# Patient Record
Sex: Male | Born: 1940 | Race: White | Hispanic: No | Marital: Married | Smoking: Former smoker
Health system: Southern US, Community
[De-identification: ages and names within clinical notes are randomized; demographics above are authoritative.]

## PROBLEM LIST (undated history)

## (undated) DIAGNOSIS — R55 Syncope and collapse: Secondary | ICD-10-CM

## (undated) DIAGNOSIS — I2510 Atherosclerotic heart disease of native coronary artery without angina pectoris: Secondary | ICD-10-CM

## (undated) DIAGNOSIS — E119 Type 2 diabetes mellitus without complications: Secondary | ICD-10-CM

## (undated) DIAGNOSIS — K219 Gastro-esophageal reflux disease without esophagitis: Secondary | ICD-10-CM

## (undated) DIAGNOSIS — E785 Hyperlipidemia, unspecified: Secondary | ICD-10-CM

## (undated) DIAGNOSIS — T7840XA Allergy, unspecified, initial encounter: Secondary | ICD-10-CM

## (undated) DIAGNOSIS — F419 Anxiety disorder, unspecified: Secondary | ICD-10-CM

## (undated) DIAGNOSIS — I10 Essential (primary) hypertension: Secondary | ICD-10-CM

## (undated) DIAGNOSIS — R011 Cardiac murmur, unspecified: Secondary | ICD-10-CM

## (undated) DIAGNOSIS — I639 Cerebral infarction, unspecified: Secondary | ICD-10-CM

## (undated) DIAGNOSIS — I219 Acute myocardial infarction, unspecified: Secondary | ICD-10-CM

## (undated) DIAGNOSIS — F32A Depression, unspecified: Secondary | ICD-10-CM

## (undated) DIAGNOSIS — M1990 Unspecified osteoarthritis, unspecified site: Secondary | ICD-10-CM

## (undated) DIAGNOSIS — H409 Unspecified glaucoma: Secondary | ICD-10-CM

## (undated) DIAGNOSIS — H269 Unspecified cataract: Secondary | ICD-10-CM

## (undated) HISTORY — PX: NECK SURGERY: SHX720

## (undated) HISTORY — DX: Unspecified glaucoma: H40.9

## (undated) HISTORY — DX: Syncope and collapse: R55

## (undated) HISTORY — DX: Essential (primary) hypertension: I10

## (undated) HISTORY — PX: MOHS SURGERY: SUR867

## (undated) HISTORY — PX: COSMETIC SURGERY: SHX468

## (undated) HISTORY — DX: Cardiac murmur, unspecified: R01.1

## (undated) HISTORY — DX: Gastro-esophageal reflux disease without esophagitis: K21.9

## (undated) HISTORY — PX: JOINT REPLACEMENT: SHX530

## (undated) HISTORY — PX: BACK SURGERY: SHX140

## (undated) HISTORY — PX: VASECTOMY: SHX75

## (undated) HISTORY — DX: Cerebral infarction, unspecified: I63.9

## (undated) HISTORY — DX: Hyperlipidemia, unspecified: E78.5

## (undated) HISTORY — DX: Unspecified cataract: H26.9

## (undated) HISTORY — DX: Unspecified osteoarthritis, unspecified site: M19.90

## (undated) HISTORY — DX: Atherosclerotic heart disease of native coronary artery without angina pectoris: I25.10

## (undated) HISTORY — PX: EYE SURGERY: SHX253

## (undated) HISTORY — DX: Allergy, unspecified, initial encounter: T78.40XA

## (undated) HISTORY — DX: Acute myocardial infarction, unspecified: I21.9

## (undated) HISTORY — PX: CHOLECYSTECTOMY: SHX55

---

## 1998-04-29 HISTORY — PX: CORONARY ARTERY BYPASS GRAFT: SHX141

## 2004-09-20 DIAGNOSIS — M47812 Spondylosis without myelopathy or radiculopathy, cervical region: Secondary | ICD-10-CM | POA: Insufficient documentation

## 2004-11-22 DIAGNOSIS — M75120 Complete rotator cuff tear or rupture of unspecified shoulder, not specified as traumatic: Secondary | ICD-10-CM | POA: Insufficient documentation

## 2005-12-25 DIAGNOSIS — M778 Other enthesopathies, not elsewhere classified: Secondary | ICD-10-CM | POA: Insufficient documentation

## 2005-12-25 DIAGNOSIS — M7700 Medial epicondylitis, unspecified elbow: Secondary | ICD-10-CM | POA: Insufficient documentation

## 2013-04-14 DIAGNOSIS — M19019 Primary osteoarthritis, unspecified shoulder: Secondary | ICD-10-CM | POA: Insufficient documentation

## 2015-12-11 DIAGNOSIS — N401 Enlarged prostate with lower urinary tract symptoms: Secondary | ICD-10-CM | POA: Insufficient documentation

## 2015-12-11 DIAGNOSIS — N138 Other obstructive and reflux uropathy: Secondary | ICD-10-CM | POA: Insufficient documentation

## 2020-01-26 ENCOUNTER — Ambulatory Visit: Payer: Medicare Other | Admitting: Cardiology

## 2020-03-11 NOTE — Progress Notes (Signed)
Cardiology Office Note   Date:  03/13/2020   ID:  Cory [name], DOB 10-18-1940, MRN [id number]  PCP:  Sueanne Margarita, DO  Cardiologist:   Alistair Senft [place], MD   Chief Complaint  Patient presents with  . Coronary Artery Disease      History of Present Illness: Cory [name] is a 79 y.o. male who is seen at the request of Dr Francesco Sor for evaluation of CAD. He has a history of CAD s/p CABG and prior CVA. History of DM type 2, HTN, and HLD. Previously followed by Dr Angelena Sole in [place], [place].   The patient had CABG in 2000 in [place], [place] at [facility] in Nov 2016 showed a fixed inferior defect c/w scar and a small reversible basal to mid anterior wall defect. EF 52%. He presented in Dec 2019 with progressive angina. Cardiac cath demonstrated a patent LIMA to the LAD, sequential SVG to the diagonal and distal LAD, SVG to ramus, SVG to OM and SVG to PDA. There was a stenosis in the distal SVG to OM that was treated percutaneously. Records don't say what type of PCI done. Post procedure he developed difficulty with aphasia and confusion. Stroke work up was negative and symptoms cleared. It was felt this was related to effects of sedation.   He has a history of thoracic aortic aneurysm measuring 4.6 cm by Echo but 4.3 cm by CT- done October 2020. He has a history of CVA in 2015 and subsequent 2 TIAs since then. 2 week event monitor done within the last year showed rare PACs and PVCs. No evidence of Afib. Echo done in the summer of 2020 showed normal EF with mild TR and MR. He has a history of HLD, HTN, GERD.   On follow up today he reports he is feeling well. No chest pain since last PCI. Denies any dyspnea or palpitations. Moved here to be closer to family.     Past Medical History:  Diagnosis Date  . CAD (coronary artery disease)   . GERD (gastroesophageal reflux disease)   . Heart murmur   . Hyperlipidemia   . Hypertension   . Stroke ([place])    . Vasovagal syncope     Past Surgical History:  Procedure Laterality Date  . BACK SURGERY    . CHOLECYSTECTOMY    . CORONARY ARTERY BYPASS GRAFT  2000  . NECK SURGERY       Current Outpatient Medications  Medication Sig Dispense Refill  . acetaminophen (TYLENOL) 500 MG tablet Take by mouth.    Marland Kitchen amLODipine (NORVASC) 5 MG tablet Take by mouth.    Marland Kitchen atorvastatin (LIPITOR) 40 MG tablet atorvastatin 40 mg tablet    . cloNIDine (CATAPRES) 0.1 MG tablet     . clopidogrel (PLAVIX) 75 MG tablet clopidogrel 75 mg tablet    . cycloSPORINE (RESTASIS) 0.05 % ophthalmic emulsion Restasis 0.05 % eye drops in a dropperette    . cycloSPORINE (RESTASIS) 0.05 % ophthalmic emulsion 1 drop 2 (two) times a day.    . diclofenac Sodium (VOLTAREN) 1 % GEL Apply topically.    . famotidine (PEPCID) 20 MG tablet famotidine    . fluticasone (FLONASE) 50 MCG/ACT nasal spray 1 spray by Each Nare route nightly.    Marland Kitchen lisinopril (ZESTRIL) 10 MG tablet lisinopril 10 mg tablet    . melatonin 1 MG TABS tablet Take by mouth.    . metFORMIN (GLUCOPHAGE) 500 MG tablet metformin 500  mg tablet    . montelukast (SINGULAIR) 10 MG tablet montelukast 10 mg tablet    . Multiple Vitamins-Minerals (PX COMPLETE SENIOR MULTIVITS) TABS Take by mouth.    . nitroGLYCERIN (NITROSTAT) 0.4 MG SL tablet Place under the tongue.    . pantoprazole (PROTONIX) 40 MG tablet pantoprazole 40 mg tablet,delayed release    . aspirin EC 81 MG tablet Take 1 tablet (81 mg total) by mouth daily. Swallow whole. 90 tablet 3   No current facility-administered medications for this visit.    Allergies:   Patient has no allergy information on record.    Social History:  The patient  reports that he has quit smoking. His smoking use included pipe. He has never used smokeless tobacco. He reports current drug use. He reports that he does not drink alcohol.   Family History:  The patient's family history includes Heart attack in his father; Heart  disease in his brother and mother.    ROS:  Please see the history of present illness.   Otherwise, review of systems are positive for none.   All other systems are reviewed and negative.    PHYSICAL EXAM: VS:  BP 140/71   Pulse 67   Temp (!) 97.3 F (36.3 C)   Ht 5\' 5"  (1.651 m)   Wt 166 lb 12.8 oz (75.7 kg)   SpO2 96%   BMI 27.76 kg/m  , BMI Body mass index is 27.76 kg/m. GEN: Well nourished, well developed, in no acute distress  HEENT: normal  Neck: no JVD, carotid bruits, or masses Cardiac: RRR; gr 2/6 apical systolic murmur, No rubs, or gallops,no edema  Respiratory:  clear to auscultation bilaterally, normal work of breathing GI: soft, nontender, nondistended, + BS MS: no deformity or atrophy  Skin: warm and dry, no rash Neuro:  Strength and sensation are intact Psych: euthymic mood, full affect   EKG:  EKG is ordered today. The ekg ordered today demonstrates NSR rate 67. First degree AV block. Probable old inferior infarct. I have personally reviewed and interpreted this study.    Recent Labs: No results found for requested labs within last 8760 hours.    Lipid Panel No results found for: CHOL, TRIG, HDL, CHOLHDL, VLDL, LDLCALC, LDLDIRECT   Labs dated 10/19/19: normal BMET, HGB, TSH. A1c 6.6% Dated 11/24/19: A1c 5.8%   Wt Readings from Last 3 Encounters:  03/13/20 166 lb 12.8 oz (75.7 kg)      Other studies Reviewed: Additional studies/ records that were reviewed today include: Records from Dr Verlon Setting was summarized above.   ASSESSMENT AND PLAN:  1.  CAD s/p remote CABG x 6 in 2000. S/p PCI of SVG to OM in Dec 2019. I would recommend continuing DAPT given significant history of CVA. Will reduce ASA to 81 mg daily. He is currently without significant angina. Class 1. Continue amlodipine  2. Thoracic aortic aneurysm. 4.3 cm by CT. Consider repeat scan within the next year  3. History of CVA with recurrent TIAs.  4. HLD. On lipitor 40 mg daily. Will  update labs  5. HTN. Stable.  6. DM type 2 on metformin. Check A1c.     Current medicines are reviewed at length with the patient today.  The patient does not have concerns regarding medicines.  The following changes have been made:  Reduce ASA to 81 mg daily.   Labs/ tests ordered today include:   Orders Placed This Encounter  Procedures  . CBC w/Diff/Platelet  . Comprehensive  Metabolic Panel (CMET)  . Lipid panel  . HgB A1c  . TSH  . EKG 12-Lead     Disposition:   FU with me in 6 months  Signed, Davian Hanshaw Martinique, MD  03/13/2020 12:11 PM    [facility] [address], [place], [place], [address] Phone [phone], Fax [phone]

## 2020-03-13 ENCOUNTER — Encounter: Payer: Self-pay | Admitting: Cardiology

## 2020-03-13 ENCOUNTER — Ambulatory Visit (INDEPENDENT_AMBULATORY_CARE_PROVIDER_SITE_OTHER): Payer: Medicare Other | Admitting: Cardiology

## 2020-03-13 VITALS — BP 140/71 | HR 67 | Temp 97.3°F | Ht 65.0 in | Wt 166.8 lb

## 2020-03-13 DIAGNOSIS — I712 Thoracic aortic aneurysm, without rupture, unspecified: Secondary | ICD-10-CM

## 2020-03-13 DIAGNOSIS — I25708 Atherosclerosis of coronary artery bypass graft(s), unspecified, with other forms of angina pectoris: Secondary | ICD-10-CM | POA: Diagnosis not present

## 2020-03-13 DIAGNOSIS — E785 Hyperlipidemia, unspecified: Secondary | ICD-10-CM

## 2020-03-13 DIAGNOSIS — Z8673 Personal history of transient ischemic attack (TIA), and cerebral infarction without residual deficits: Secondary | ICD-10-CM

## 2020-03-13 DIAGNOSIS — I10 Essential (primary) hypertension: Secondary | ICD-10-CM

## 2020-03-13 MED ORDER — ASPIRIN EC 81 MG PO TBEC: 81.0000 mg | DELAYED_RELEASE_TABLET | Freq: Every day | ORAL | 3 refills | Status: AC

## 2020-03-13 NOTE — Patient Instructions (Signed)
We will check lab work today  Reduce ASA to 81 mg daily  Follow up in 6 months.

## 2020-03-14 LAB — CBC WITH DIFFERENTIAL/PLATELET
Basophils Absolute: 0 10*3/uL (ref 0.0–0.2)
Basos: 1 %
EOS (ABSOLUTE): 0.1 10*3/uL (ref 0.0–0.4)
Eos: 2 %
Hematocrit: 37 % — ABNORMAL LOW (ref 37.5–51.0)
Hemoglobin: 12.1 g/dL — ABNORMAL LOW (ref 13.0–17.7)
Immature Grans (Abs): 0 10*3/uL (ref 0.0–0.1)
Immature Granulocytes: 0 %
Lymphocytes Absolute: 1 10*3/uL (ref 0.7–3.1)
Lymphs: 21 %
MCH: 30.3 pg (ref 26.6–33.0)
MCHC: 32.7 g/dL (ref 31.5–35.7)
MCV: 93 fL (ref 79–97)
Monocytes Absolute: 0.6 10*3/uL (ref 0.1–0.9)
Monocytes: 13 %
Neutrophils Absolute: 2.9 10*3/uL (ref 1.4–7.0)
Neutrophils: 63 %
Platelets: 216 10*3/uL (ref 150–450)
RBC: 3.99 x10E6/uL — ABNORMAL LOW (ref 4.14–5.80)
RDW: 12.3 % (ref 11.6–15.4)
WBC: 4.6 10*3/uL (ref 3.4–10.8)

## 2020-03-14 LAB — COMPREHENSIVE METABOLIC PANEL
ALT: 20 IU/L (ref 0–44)
AST: 25 IU/L (ref 0–40)
Albumin/Globulin Ratio: 2 (ref 1.2–2.2)
Albumin: 4.3 g/dL (ref 3.7–4.7)
Alkaline Phosphatase: 55 IU/L (ref 44–121)
BUN/Creatinine Ratio: 13 (ref 10–24)
BUN: 10 mg/dL (ref 8–27)
Bilirubin Total: 0.2 mg/dL (ref 0.0–1.2)
CO2: 21 mmol/L (ref 20–29)
Calcium: 9.4 mg/dL (ref 8.6–10.2)
Chloride: 98 mmol/L (ref 96–106)
Creatinine, Ser: 0.8 mg/dL (ref 0.76–1.27)
GFR calc Af Amer: 98 mL/min/{1.73_m2} (ref >59)
GFR calc non Af Amer: 84 mL/min/{1.73_m2} (ref >59)
Globulin, Total: 2.1 g/dL (ref 1.5–4.5)
Glucose: 108 mg/dL — ABNORMAL HIGH (ref 65–99)
Potassium: 5 mmol/L (ref 3.5–5.2)
Sodium: 134 mmol/L (ref 134–144)
Total Protein: 6.4 g/dL (ref 6.0–8.5)

## 2020-03-14 LAB — HEMOGLOBIN A1C
Est. average glucose Bld gHb Est-mCnc: 131 mg/dL
Hgb A1c MFr Bld: 6.2 % — ABNORMAL HIGH (ref 4.8–5.6)

## 2020-03-14 LAB — LIPID PANEL
Chol/HDL Ratio: 2.9 ratio (ref 0.0–5.0)
Cholesterol, Total: 137 mg/dL (ref 100–199)
HDL: 47 mg/dL (ref >39)
LDL Chol Calc (NIH): 65 mg/dL (ref 0–99)
Triglycerides: 146 mg/dL (ref 0–149)
VLDL Cholesterol Cal: 25 mg/dL (ref 5–40)

## 2020-03-14 LAB — TSH: TSH: 1.86 u[IU]/mL (ref 0.450–4.500)

## 2020-04-13 ENCOUNTER — Ambulatory Visit (INDEPENDENT_AMBULATORY_CARE_PROVIDER_SITE_OTHER): Payer: Medicare Other | Admitting: Family Medicine

## 2020-04-13 ENCOUNTER — Encounter: Payer: Self-pay | Admitting: Family Medicine

## 2020-04-13 ENCOUNTER — Other Ambulatory Visit: Payer: Self-pay

## 2020-04-13 VITALS — BP 120/78 | HR 66 | Temp 97.6°F | Ht 65.0 in | Wt 167.0 lb

## 2020-04-13 DIAGNOSIS — I10 Essential (primary) hypertension: Secondary | ICD-10-CM | POA: Diagnosis not present

## 2020-04-13 DIAGNOSIS — R7303 Prediabetes: Secondary | ICD-10-CM

## 2020-04-13 DIAGNOSIS — I6930 Unspecified sequelae of cerebral infarction: Secondary | ICD-10-CM | POA: Insufficient documentation

## 2020-04-13 DIAGNOSIS — K2270 Barrett's esophagus without dysplasia: Secondary | ICD-10-CM | POA: Insufficient documentation

## 2020-04-13 DIAGNOSIS — K22719 Barrett's esophagus with dysplasia, unspecified: Secondary | ICD-10-CM

## 2020-04-13 DIAGNOSIS — K449 Diaphragmatic hernia without obstruction or gangrene: Secondary | ICD-10-CM | POA: Diagnosis not present

## 2020-04-13 DIAGNOSIS — J302 Other seasonal allergic rhinitis: Secondary | ICD-10-CM | POA: Diagnosis not present

## 2020-04-13 DIAGNOSIS — M1990 Unspecified osteoarthritis, unspecified site: Secondary | ICD-10-CM | POA: Insufficient documentation

## 2020-04-13 DIAGNOSIS — I2510 Atherosclerotic heart disease of native coronary artery without angina pectoris: Secondary | ICD-10-CM | POA: Insufficient documentation

## 2020-04-13 DIAGNOSIS — R351 Nocturia: Secondary | ICD-10-CM

## 2020-04-13 DIAGNOSIS — R600 Localized edema: Secondary | ICD-10-CM

## 2020-04-13 DIAGNOSIS — I69351 Hemiplegia and hemiparesis following cerebral infarction affecting right dominant side: Secondary | ICD-10-CM | POA: Insufficient documentation

## 2020-04-13 DIAGNOSIS — E1169 Type 2 diabetes mellitus with other specified complication: Secondary | ICD-10-CM | POA: Insufficient documentation

## 2020-04-13 MED ORDER — CLOPIDOGREL BISULFATE 75 MG PO TABS: 75.0000 mg | ORAL_TABLET | Freq: Every day | ORAL | 3 refills | Status: DC

## 2020-04-13 MED ORDER — METFORMIN HCL 500 MG PO TABS: 500.0000 mg | ORAL_TABLET | Freq: Every day | ORAL | 3 refills | Status: DC

## 2020-04-13 MED ORDER — LISINOPRIL 10 MG PO TABS: 10.0000 mg | ORAL_TABLET | Freq: Every day | ORAL | 3 refills | Status: DC

## 2020-04-13 MED ORDER — AMLODIPINE BESYLATE 5 MG PO TABS: 5.0000 mg | ORAL_TABLET | Freq: Every day | ORAL | 3 refills | Status: DC

## 2020-04-13 MED ORDER — CLONIDINE HCL 0.1 MG PO TABS: 0.1000 mg | ORAL_TABLET | Freq: Two times a day (BID) | ORAL | 3 refills | Status: DC

## 2020-04-13 MED ORDER — PANTOPRAZOLE SODIUM 40 MG PO TBEC: 40.0000 mg | DELAYED_RELEASE_TABLET | Freq: Two times a day (BID) | ORAL | 3 refills | Status: DC

## 2020-04-13 MED ORDER — ATORVASTATIN CALCIUM 40 MG PO TABS: 40.0000 mg | ORAL_TABLET | Freq: Every day | ORAL | 3 refills | Status: DC

## 2020-04-13 MED ORDER — MONTELUKAST SODIUM 10 MG PO TABS: 10.0000 mg | ORAL_TABLET | Freq: Every day | ORAL | 3 refills | Status: DC

## 2020-04-13 NOTE — Assessment & Plan Note (Signed)
Suspect this is contributing to edema. Cont with PT to work on mobility.

## 2020-04-13 NOTE — Assessment & Plan Note (Signed)
Discussed trial of switching pantoprazole from 40 mg BID to once daily. Cont famotidine 20 mg daily. Cont to avoid food triggers

## 2020-04-13 NOTE — Assessment & Plan Note (Signed)
Cont singulair 10 mg

## 2020-04-13 NOTE — Assessment & Plan Note (Signed)
Hgba1c 6.2. Cont metformin 500 mg daily

## 2020-04-13 NOTE — Assessment & Plan Note (Signed)
BP at goal. Cont lisinopril 10 mg, clonidine 0.1 mg, amlodipine 5 mg. Appreciate cardiology support.

## 2020-04-13 NOTE — Progress Notes (Signed)
Subjective:     Cory [name] is a 79 y.o. 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 Types: Pipe  Smokeless Tobacco Never Used        Objective:    BP Readings from Last 3 Encounters:  04/13/20 120/78  03/13/20 140/71   Wt Readings from Last 3 Encounters:  04/13/20 167 lb (75.8 kg)  03/13/20 166 lb 12.8 oz (75.7 kg)    BP 120/78   Pulse 66   Temp 97.6 F (36.4 C) (Temporal)   Ht 5\' 5"  (1.651 m)   Wt 167 lb (75.8 kg)   SpO2 97%   BMI 27.79 kg/m    Physical Exam Constitutional:      Appearance: Normal appearance. He is not ill-appearing or diaphoretic.  HENT:     Right Ear: External ear normal.     Left Ear: External ear normal.  Eyes:     General: No scleral icterus.    Extraocular Movements: Extraocular movements intact.     Conjunctiva/sclera: Conjunctivae normal.  Cardiovascular:     Rate and Rhythm: Normal rate and regular rhythm.     Heart sounds: Heart sounds are distant.  Pulmonary:     Effort: Pulmonary effort is normal.     Breath sounds: Normal breath sounds.  Musculoskeletal:     Cervical back: Neck supple.     Right lower leg: Edema (1+ in compression socks) present.     Left lower leg: Edema (trace, in  compression socks) present.  Skin:    General: Skin is warm and dry.  Neurological:     Mental Status: He is alert. Mental status is at baseline.  Psychiatric:        Mood and Affect: Mood normal.           Assessment & Plan:   Problem List Items Addressed This Visit      Cardiovascular and Mediastinum   Hypertension    BP at goal. Cont lisinopril 10 mg, clonidine 0.1 mg, amlodipine 5 mg. Appreciate cardiology support.       Relevant Medications   amLODipine (NORVASC) 5 MG tablet   atorvastatin (LIPITOR) 40 MG tablet   cloNIDine (CATAPRES) 0.1 MG tablet   lisinopril (ZESTRIL) 10 MG tablet     Digestive   Barrett esophagus    Discussed trial of switching pantoprazole from 40 mg BID to once daily. Cont famotidine 20 mg daily. Cont to avoid food triggers        Other   Prediabetes    Hgba1c 6.2. Cont metformin 500 mg daily      Relevant Medications   metFORMIN (GLUCOPHAGE) 500 MG  tablet   History of CVA with residual deficit    Suspect this is contributing to edema. Cont with PT to work on mobility.       Relevant Medications   atorvastatin (LIPITOR) 40 MG tablet   clopidogrel (PLAVIX) 75 MG tablet   Hiatal hernia - Primary   Relevant Medications   pantoprazole (PROTONIX) 40 MG tablet   Seasonal allergies    Cont singulair 10 mg      Relevant Medications   montelukast (SINGULAIR) 10 MG tablet   Nocturia    Hx of procedure with urology. Not interested in medication at this time. Did respond to Myrbetriq in the past but this was costly. Advised urology referral if becoming bothersome.       Lower extremity edema    Multifactorial - prior stroke, hx of right hip replacement, hx of quadricept injury. Encouraged daily compression socks, elevation, and continued working with PT on strengthening. If worsening acutely would consider [place] and could consider diuretic if current treatment does not work.            Return in about 4 months (around  08/12/2020).  [name], MD  This visit occurred during the SARS-CoV-2 public health emergency.  Safety protocols were in place, including screening questions prior to the visit, additional usage of staff PPE, and extensive cleaning of exam room while observing appropriate contact time as indicated for disinfecting solutions.

## 2020-04-13 NOTE — Patient Instructions (Addendum)
#  Heartburn  - try decreasing Pantoprazole to 40 mg once daily - Cont Famotidine - If symptoms return - try taking Famotidine twice daily for 2 weeks to see if that will control symptoms - if no improvement, can return to Pantoprazole twice daily and famotidine daily  #Edema - Continue wearing compression socks - continue working with physical therapy

## 2020-04-13 NOTE — Assessment & Plan Note (Signed)
Hx of procedure with urology. Not interested in medication at this time. Did respond to Myrbetriq in the past but this was costly. Advised urology referral if becoming bothersome.

## 2020-04-13 NOTE — Assessment & Plan Note (Signed)
Multifactorial - prior stroke, hx of right hip replacement, hx of quadricept injury. Encouraged daily compression socks, elevation, and continued working with PT on strengthening. If worsening acutely would consider [place] and could consider diuretic if current treatment does not work.

## 2020-05-06 ENCOUNTER — Observation Stay: Payer: Medicare Other

## 2020-05-06 ENCOUNTER — Observation Stay
Admission: EM | Admit: 2020-05-06 | Discharge: 2020-05-07 | Disposition: A | Discharge status: Home / Self Care | Payer: Medicare Other | Attending: Internal Medicine | Admitting: Internal Medicine

## 2020-05-06 ENCOUNTER — Emergency Department: Payer: Medicare Other

## 2020-05-06 DIAGNOSIS — R4701 Aphasia: Secondary | ICD-10-CM | POA: Diagnosis present

## 2020-05-06 DIAGNOSIS — I2510 Atherosclerotic heart disease of native coronary artery without angina pectoris: Secondary | ICD-10-CM | POA: Insufficient documentation

## 2020-05-06 DIAGNOSIS — R2689 Other abnormalities of gait and mobility: Secondary | ICD-10-CM | POA: Diagnosis not present

## 2020-05-06 DIAGNOSIS — I25119 Atherosclerotic heart disease of native coronary artery with unspecified angina pectoris: Secondary | ICD-10-CM | POA: Diagnosis not present

## 2020-05-06 DIAGNOSIS — K219 Gastro-esophageal reflux disease without esophagitis: Secondary | ICD-10-CM | POA: Insufficient documentation

## 2020-05-06 DIAGNOSIS — Z7902 Long term (current) use of antithrombotics/antiplatelets: Secondary | ICD-10-CM | POA: Diagnosis not present

## 2020-05-06 DIAGNOSIS — Z79899 Other long term (current) drug therapy: Secondary | ICD-10-CM | POA: Diagnosis not present

## 2020-05-06 DIAGNOSIS — I671 Cerebral aneurysm, nonruptured: Secondary | ICD-10-CM | POA: Diagnosis not present

## 2020-05-06 DIAGNOSIS — I10 Essential (primary) hypertension: Secondary | ICD-10-CM | POA: Diagnosis not present

## 2020-05-06 DIAGNOSIS — I6782 Cerebral ischemia: Secondary | ICD-10-CM | POA: Insufficient documentation

## 2020-05-06 DIAGNOSIS — E871 Hypo-osmolality and hyponatremia: Secondary | ICD-10-CM | POA: Insufficient documentation

## 2020-05-06 DIAGNOSIS — I6523 Occlusion and stenosis of bilateral carotid arteries: Secondary | ICD-10-CM | POA: Insufficient documentation

## 2020-05-06 DIAGNOSIS — Z20822 Contact with and (suspected) exposure to covid-19: Secondary | ICD-10-CM | POA: Diagnosis not present

## 2020-05-06 DIAGNOSIS — Z8249 Family history of ischemic heart disease and other diseases of the circulatory system: Secondary | ICD-10-CM | POA: Diagnosis not present

## 2020-05-06 DIAGNOSIS — Z7982 Long term (current) use of aspirin: Secondary | ICD-10-CM | POA: Diagnosis not present

## 2020-05-06 DIAGNOSIS — M6281 Muscle weakness (generalized): Secondary | ICD-10-CM | POA: Diagnosis not present

## 2020-05-06 DIAGNOSIS — E785 Hyperlipidemia, unspecified: Secondary | ICD-10-CM | POA: Diagnosis not present

## 2020-05-06 DIAGNOSIS — G459 Transient cerebral ischemic attack, unspecified: Secondary | ICD-10-CM | POA: Diagnosis not present

## 2020-05-06 DIAGNOSIS — Z951 Presence of aortocoronary bypass graft: Secondary | ICD-10-CM | POA: Insufficient documentation

## 2020-05-06 DIAGNOSIS — F419 Anxiety disorder, unspecified: Secondary | ICD-10-CM

## 2020-05-06 DIAGNOSIS — Z885 Allergy status to narcotic agent status: Secondary | ICD-10-CM | POA: Insufficient documentation

## 2020-05-06 DIAGNOSIS — Z8673 Personal history of transient ischemic attack (TIA), and cerebral infarction without residual deficits: Secondary | ICD-10-CM | POA: Diagnosis not present

## 2020-05-06 DIAGNOSIS — Z87891 Personal history of nicotine dependence: Secondary | ICD-10-CM | POA: Insufficient documentation

## 2020-05-06 DIAGNOSIS — Z880 Allergy status to penicillin: Secondary | ICD-10-CM | POA: Insufficient documentation

## 2020-05-06 LAB — URINE DRUG SCREEN, QUALITATIVE (ARMC ONLY)
Amphetamines, Ur Screen: NOT DETECTED
Barbiturates, Ur Screen: NOT DETECTED
Benzodiazepine, Ur Scrn: NOT DETECTED
Cannabinoid 50 Ng, Ur ~~LOC~~: NOT DETECTED
Cocaine Metabolite,Ur ~~LOC~~: NOT DETECTED
MDMA (Ecstasy)Ur Screen: NOT DETECTED
Methadone Scn, Ur: NOT DETECTED
Opiate, Ur Screen: NOT DETECTED
Phencyclidine (PCP) Ur S: NOT DETECTED
Tricyclic, Ur Screen: NOT DETECTED

## 2020-05-06 LAB — CHLORIDE, URINE, RANDOM: Chloride Urine: 71 mmol/L

## 2020-05-06 LAB — URINALYSIS, ROUTINE W REFLEX MICROSCOPIC
Bilirubin Urine: NEGATIVE
Glucose, UA: NEGATIVE mg/dL
Hgb urine dipstick: NEGATIVE
Ketones, ur: NEGATIVE mg/dL
Leukocytes,Ua: NEGATIVE
Nitrite: NEGATIVE
Protein, ur: NEGATIVE mg/dL
Specific Gravity, Urine: 1.006 (ref 1.005–1.030)
pH: 6 (ref 5.0–8.0)

## 2020-05-06 LAB — COMPREHENSIVE METABOLIC PANEL
ALT: 22 U/L (ref 0–44)
AST: 26 U/L (ref 15–41)
Albumin: 4.1 g/dL (ref 3.5–5.0)
Alkaline Phosphatase: 49 U/L (ref 38–126)
Anion gap: 12 (ref 5–15)
BUN: 11 mg/dL (ref 8–23)
CO2: 22 mmol/L (ref 22–32)
Calcium: 9.2 mg/dL (ref 8.9–10.3)
Chloride: 96 mmol/L — ABNORMAL LOW (ref 98–111)
Creatinine, Ser: 0.92 mg/dL (ref 0.61–1.24)
GFR, Estimated: >60 mL/min (ref >60)
Glucose, Bld: 139 mg/dL — ABNORMAL HIGH (ref 70–99)
Potassium: 4.1 mmol/L (ref 3.5–5.1)
Sodium: 130 mmol/L — ABNORMAL LOW (ref 135–145)
Total Bilirubin: 0.7 mg/dL (ref 0.3–1.2)
Total Protein: 7.2 g/dL (ref 6.5–8.1)

## 2020-05-06 LAB — CBC WITH DIFFERENTIAL/PLATELET
Abs Immature Granulocytes: 0.01 10*3/uL (ref 0.00–0.07)
Basophils Absolute: 0 10*3/uL (ref 0.0–0.1)
Basophils Relative: 1 %
Eosinophils Absolute: 0.1 10*3/uL (ref 0.0–0.5)
Eosinophils Relative: 2 %
HCT: 35.4 % — ABNORMAL LOW (ref 39.0–52.0)
Hemoglobin: 11.8 g/dL — ABNORMAL LOW (ref 13.0–17.0)
Immature Granulocytes: 0 %
Lymphocytes Relative: 19 %
Lymphs Abs: 1.2 10*3/uL (ref 0.7–4.0)
MCH: 30.6 pg (ref 26.0–34.0)
MCHC: 33.3 g/dL (ref 30.0–36.0)
MCV: 91.9 fL (ref 80.0–100.0)
Monocytes Absolute: 0.7 10*3/uL (ref 0.1–1.0)
Monocytes Relative: 10 %
Neutro Abs: 4.6 10*3/uL (ref 1.7–7.7)
Neutrophils Relative %: 68 %
Platelets: 226 10*3/uL (ref 150–400)
RBC: 3.85 MIL/uL — ABNORMAL LOW (ref 4.22–5.81)
RDW: 12.4 % (ref 11.5–15.5)
WBC: 6.6 10*3/uL (ref 4.0–10.5)
nRBC: 0 % (ref 0.0–0.2)

## 2020-05-06 LAB — APTT: aPTT: 32 seconds (ref 24–36)

## 2020-05-06 LAB — PROTIME-INR
INR: 1.1 (ref 0.8–1.2)
Prothrombin Time: 13.6 seconds (ref 11.4–15.2)

## 2020-05-06 LAB — CBG MONITORING, ED: Glucose-Capillary: 128 mg/dL — ABNORMAL HIGH (ref 70–99)

## 2020-05-06 LAB — OSMOLALITY, URINE: Osmolality, Ur: 251 mOsm/kg — ABNORMAL LOW (ref 300–900)

## 2020-05-06 LAB — OSMOLALITY: Osmolality: 280 mOsm/kg (ref 275–295)

## 2020-05-06 LAB — ETHANOL: Alcohol, Ethyl (B): <10 mg/dL (ref <10)

## 2020-05-06 LAB — SODIUM, URINE, RANDOM: Sodium, Ur: 72 mmol/L

## 2020-05-06 MED ORDER — ENOXAPARIN SODIUM 40 MG/0.4ML ~~LOC~~ SOLN
40.0000 mg | SUBCUTANEOUS | Status: DC
  Administered 2020-05-06: 40 mg via SUBCUTANEOUS
  Filled 2020-05-06: qty 0.4

## 2020-05-06 MED ORDER — HYDRALAZINE HCL 20 MG/ML IJ SOLN: 10.0000 mg | Freq: Four times a day (QID) | INTRAMUSCULAR | Status: DC | PRN

## 2020-05-06 MED ORDER — FAMOTIDINE 20 MG PO TABS
20.0000 mg | ORAL_TABLET | Freq: Every day | ORAL | Status: DC
  Administered 2020-05-06: 20 mg via ORAL
  Filled 2020-05-06: qty 1

## 2020-05-06 MED ORDER — CLOPIDOGREL BISULFATE 75 MG PO TABS
75.0000 mg | ORAL_TABLET | Freq: Every day | ORAL | Status: DC
  Administered 2020-05-07: 75 mg via ORAL
  Filled 2020-05-06 (×2): qty 1

## 2020-05-06 MED ORDER — ACETAMINOPHEN 325 MG PO TABS
650.0000 mg | ORAL_TABLET | Freq: Four times a day (QID) | ORAL | Status: DC | PRN
  Administered 2020-05-06: 650 mg via ORAL
  Filled 2020-05-06: qty 2

## 2020-05-06 MED ORDER — STROKE: EARLY STAGES OF RECOVERY BOOK: Freq: Once | Status: DC

## 2020-05-06 MED ORDER — LISINOPRIL 10 MG PO TABS
10.0000 mg | ORAL_TABLET | Freq: Every day | ORAL | Status: DC
Start: 2020-05-06 — End: 2020-05-07
  Administered 2020-05-07: 10 mg via ORAL
  Filled 2020-05-06 (×2): qty 1

## 2020-05-06 MED ORDER — ASPIRIN EC 81 MG PO TBEC
81.0000 mg | DELAYED_RELEASE_TABLET | Freq: Every day | ORAL | Status: DC
Start: 2020-05-06 — End: 2020-05-07
  Administered 2020-05-07: 81 mg via ORAL
  Filled 2020-05-06: qty 1

## 2020-05-06 MED ORDER — NITROGLYCERIN 0.4 MG SL SUBL: 0.4000 mg | SUBLINGUAL_TABLET | SUBLINGUAL | Status: DC | PRN

## 2020-05-06 MED ORDER — ATORVASTATIN CALCIUM 20 MG PO TABS
80.0000 mg | ORAL_TABLET | Freq: Every day | ORAL | Status: DC
  Administered 2020-05-06 – 2020-05-07 (×2): 80 mg via ORAL
  Filled 2020-05-06 (×2): qty 4

## 2020-05-06 MED ORDER — MELATONIN 3 MG PO TABS
3.0000 mg | ORAL_TABLET | Freq: Every day | ORAL | Status: DC
  Administered 2020-05-06: 3 mg via ORAL
  Filled 2020-05-06 (×2): qty 1

## 2020-05-06 MED ORDER — CYCLOSPORINE 0.05 % OP EMUL
1.0000 [drp] | Freq: Two times a day (BID) | OPHTHALMIC | Status: DC
  Administered 2020-05-06 – 2020-05-07 (×2): 1 [drp] via OPHTHALMIC
  Filled 2020-05-06 (×3): qty 1

## 2020-05-06 MED ORDER — SENNOSIDES-DOCUSATE SODIUM 8.6-50 MG PO TABS: 1.0000 | ORAL_TABLET | Freq: Every evening | ORAL | Status: DC | PRN

## 2020-05-06 MED ORDER — PANTOPRAZOLE SODIUM 40 MG PO TBEC
40.0000 mg | DELAYED_RELEASE_TABLET | Freq: Two times a day (BID) | ORAL | Status: DC
  Administered 2020-05-07: 40 mg via ORAL
  Filled 2020-05-06 (×2): qty 1

## 2020-05-06 MED ORDER — SODIUM CHLORIDE 0.9 % IV SOLN: INTRAVENOUS | Status: DC

## 2020-05-06 MED ORDER — ASPIRIN EC 325 MG PO TBEC
325.0000 mg | DELAYED_RELEASE_TABLET | Freq: Once | ORAL | Status: AC
  Administered 2020-05-06: 325 mg via ORAL
  Filled 2020-05-06: qty 1

## 2020-05-06 NOTE — Progress Notes (Signed)
Stella paged for CODE STROKE; [place] found pt. in ED hall and remained nearby as ED MD examined pt.  At length, [place] introduced himself to pt.'s wife at bedside; no needs at this time. Luke remains available.

## 2020-05-06 NOTE — ED Notes (Signed)
Md at bedside aware of Blood pressure would like to keep sbp above 160.

## 2020-05-06 NOTE — ED Notes (Signed)
Dr Archie Balboa reviewed ekg

## 2020-05-06 NOTE — H&P (Signed)
Chief Complaint: Patient was brought in on account of concerns for expressive aphasia which is new.  HPI:  Cory [name] is an 80 y.o. male. He is right-handed male with prior history of CVA with residual right-sided weakness.  Other comorbidities include coronary disease status post CABG, hypertension, hyperlipidemia and GERD. Patient has been in his usual state of health until earlier today when wife had reported patient to have some evidence of expressive aphasia.  Symptoms were associated with frontal headaches, described pain as 3/10 intensity and nonradiating. Denied any prior history of migraine headaches. As per patient, he started experiencing intermittent symptoms over the past couple of days.Symptoms would resolve on its own. Patient was brought into the emergency room but was deemed not a candidate for TPA. By the time the patient arrived, symptoms had improved and was scored 0 for NIHSS score. Patient was seen in the ED by neurology consult, initial impression is TIA. Patient being admitted for 23 observation for further work-up and management.  Past Medical History:  Diagnosis Date  . CAD (coronary artery disease)   . GERD (gastroesophageal reflux disease)   . Heart murmur   . Hyperlipidemia   . Hypertension   . Stroke ([place])   . Vasovagal syncope     Past Surgical History:  Procedure Laterality Date  . BACK SURGERY    . CHOLECYSTECTOMY    . CORONARY ARTERY BYPASS GRAFT  2000  . NECK SURGERY      Family History  Problem Relation Age of Onset  . Heart disease Mother   . Heart attack Father   . Heart disease Brother    Social History:  reports that he has quit smoking. His smoking use included pipe. He has never used smokeless tobacco. He reports current drug use. He reports that he does not drink alcohol.  Allergies:  Allergies  Allergen Reactions  . Fesoterodine Other (See Comments)    Dry mouth and constipation  . Isosorbide Other (See Comments)  . Tramadol  Other (See Comments)  . Grass Pollen(K-O-R-T-Swt Vern) Other (See Comments)  . Hydrocodone Anxiety  . Meperidine Other (See Comments) and Rash    Other reaction(s): Other (See Comments) HALLUCINATIONS   . Penicillins Rash    (Not in a hospital admission)   Results for orders placed or performed during the hospital encounter of 05/06/20 (from the past 48 hour(s))  Ethanol     Status: None   Collection Time: 05/06/20  4:55 PM  Result Value Ref Range   Alcohol, Ethyl (B) <10 <10 mg/dL    Comment: (NOTE) Lowest detectable limit for serum alcohol is 10 mg/dL.  For medical purposes only. Performed at [facility], [address] [place], [place] [address]   Protime-INR     Status: None   Collection Time: 05/06/20  4:55 PM  Result Value Ref Range   Prothrombin Time 13.6 11.4 - 15.2 seconds   INR 1.1 0.8 - 1.2    Comment: (NOTE) INR goal varies based on device and disease states. Performed at [facility], [address] [place], [place] [address]   APTT     Status: None   Collection Time: 05/06/20  4:55 PM  Result Value Ref Range   aPTT 32 24 - 36 seconds    Comment: Performed at [facility], [address] [place], [place] [address]  Comprehensive metabolic panel     Status: Abnormal   Collection Time: 05/06/20  4:55 PM  Result  Value Ref Range   Sodium 130 (L) 135 - 145 mmol/L   Potassium 4.1 3.5 - 5.1 mmol/L   Chloride 96 (L) 98 - 111 mmol/L   CO2 22 22 - 32 mmol/L   Glucose, Bld 139 (H) 70 - 99 mg/dL    Comment: Glucose reference range applies only to samples taken after fasting for at least 8 hours.   BUN 11 8 - 23 mg/dL   Creatinine, Ser 0.92 0.61 - 1.24 mg/dL   Calcium 9.2 8.9 - 10.3 mg/dL   Total Protein 7.2 6.5 - 8.1 g/dL   Albumin 4.1 3.5 - 5.0 g/dL   AST 26 15 - 41 U/L   ALT 22 0 - 44 U/L   Alkaline Phosphatase 49 38 - 126 U/L   Total Bilirubin 0.7 0.3 - 1.2 mg/dL   GFR, Estimated >60 >60 mL/min    Comment:  (NOTE) Calculated using the CKD-EPI Creatinine Equation (2021)    Anion gap 12 5 - 15    Comment: Performed at [facility], [address] [place], [place] [address]  CBC WITH DIFFERENTIAL     Status: Abnormal   Collection Time: 05/06/20  4:55 PM  Result Value Ref Range   WBC 6.6 4.0 - 10.5 K/uL   RBC 3.85 (L) 4.22 - 5.81 MIL/uL   Hemoglobin 11.8 (L) 13.0 - 17.0 g/dL   HCT 35.4 (L) 39.0 - 52.0 %   MCV 91.9 80.0 - 100.0 fL   MCH 30.6 26.0 - 34.0 pg   MCHC 33.3 30.0 - 36.0 g/dL   RDW 12.4 11.5 - 15.5 %   Platelets 226 150 - 400 K/uL   nRBC 0.0 0.0 - 0.2 %   Neutrophils Relative % 68 %   Neutro Abs 4.6 1.7 - 7.7 K/uL   Lymphocytes Relative 19 %   Lymphs Abs 1.2 0.7 - 4.0 K/uL   Monocytes Relative 10 %   Monocytes Absolute 0.7 0.1 - 1.0 K/uL   Eosinophils Relative 2 %   Eosinophils Absolute 0.1 0.0 - 0.5 K/uL   Basophils Relative 1 %   Basophils Absolute 0.0 0.0 - 0.1 K/uL   Immature Granulocytes 0 %   Abs Immature Granulocytes 0.01 0.00 - 0.07 K/uL    Comment: Performed at [facility], [address] [place], [place] [address]  CBG monitoring, ED     Status: Abnormal   Collection Time: 05/06/20  4:58 PM  Result Value Ref Range   Glucose-Capillary 128 (H) 70 - 99 mg/dL    Comment: Glucose reference range applies only to samples taken after fasting for at least 8 hours.  Urine Drug Screen, Qualitative     Status: None   Collection Time: 05/06/20  6:30 PM  Result Value Ref Range   Tricyclic, Ur Screen NONE DETECTED NONE DETECTED   Amphetamines, Ur Screen NONE DETECTED NONE DETECTED   MDMA (Ecstasy)Ur Screen NONE DETECTED NONE DETECTED   Cocaine Metabolite,Ur [place] NONE DETECTED NONE DETECTED   Opiate, Ur Screen NONE DETECTED NONE DETECTED   Phencyclidine (PCP) Ur S NONE DETECTED NONE DETECTED   Cannabinoid 50 Ng, Ur [place] NONE DETECTED NONE DETECTED   Barbiturates, Ur Screen NONE DETECTED NONE DETECTED   Benzodiazepine, Ur Scrn NONE DETECTED NONE DETECTED    Methadone Scn, Ur NONE DETECTED NONE DETECTED    Comment: (NOTE) Tricyclics + metabolites, urine    Cutoff 1000 ng/mL Amphetamines + metabolites, urine  Cutoff 1000 ng/mL MDMA (Ecstasy), urine  Cutoff 500 ng/mL Cocaine Metabolite, urine          Cutoff 300 ng/mL Opiate + metabolites, urine        Cutoff 300 ng/mL Phencyclidine (PCP), urine         Cutoff 25 ng/mL Cannabinoid, urine                 Cutoff 50 ng/mL Barbiturates + metabolites, urine  Cutoff 200 ng/mL Benzodiazepine, urine              Cutoff 200 ng/mL Methadone, urine                   Cutoff 300 ng/mL  The urine drug screen provides only a preliminary, unconfirmed analytical test result and should not be used for non-medical purposes. Clinical consideration and professional judgment should be applied to any positive drug screen result due to possible interfering substances. A more specific alternate chemical method must be used in order to obtain a confirmed analytical result. Gas chromatography / mass spectrometry (GC/MS) is the preferred confirm atory method. Performed at [facility], [address] [place], [place] [address]   Urinalysis, Routine w reflex microscopic Urine, Clean Catch     Status: Abnormal   Collection Time: 05/06/20  6:30 PM  Result Value Ref Range   Color, Urine STRAW (A) YELLOW   APPearance CLEAR (A) CLEAR   Specific Gravity, Urine 1.006 1.005 - 1.030   pH 6.0 5.0 - 8.0   Glucose, UA NEGATIVE NEGATIVE mg/dL   Hgb urine dipstick NEGATIVE NEGATIVE   Bilirubin Urine NEGATIVE NEGATIVE   Ketones, ur NEGATIVE NEGATIVE mg/dL   Protein, ur NEGATIVE NEGATIVE mg/dL   Nitrite NEGATIVE NEGATIVE   Leukocytes,Ua NEGATIVE NEGATIVE    Comment: Performed at [facility], [address] [place], [place] [address]   CT HEAD CODE STROKE WO CONTRAST`  Result Date: 05/06/2020 CLINICAL DATA:  Code stroke. Intermittent dysphagia with frontal headache EXAM: CT HEAD WITHOUT  CONTRAST TECHNIQUE: Contiguous axial images were obtained from the base of the skull through the vertex without intravenous contrast. COMPARISON:  None. FINDINGS: Brain: There is no acute intracranial hemorrhage, mass effect or edema. No acute appearing loss of gray-white differentiation. There are chronic infarcts of the left thalamocapsular region and right cerebellum. Additional patchy and confluent areas of hypoattenuation in the supratentorial white matter are nonspecific but probably reflect moderate chronic microvascular ischemic changes. Prominence of the ventricles and sulci reflects generalized parenchymal volume loss. There is no extra-axial collection. Vascular: No hyperdense vessel. There is intracranial atherosclerotic calcification at the skull base. Skull: Unremarkable. Sinuses/Orbits: No acute abnormality. Other: Mastoid air cells are clear. ASPECTS ([place] Stroke Program Early CT Score) - Ganglionic level infarction (caudate, lentiform nuclei, internal capsule, insula, M1-M3 cortex): 7 - Supraganglionic infarction (M4-M6 cortex): 3 Total score (0-10 with 10 being normal): 10 IMPRESSION: There is no acute intracranial hemorrhage or evidence of acute infarction. ASPECT score is 10. Moderate chronic microvascular ischemic changes. 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 These results were called by telephone at the time of interpretation on 05/06/2020 at 4:36 pm to provider Lavonia Drafts , who verbally acknowledged these results. Electronically Signed   By: Macy Mis M.D.   On: 05/06/2020 16:42    Review of Systems  Constitutional: Negative.   HENT: Negative.   Eyes: Negative.   Respiratory: Negative.   Cardiovascular: Negative.   Gastrointestinal: Negative.   Endocrine: Negative.   Musculoskeletal: Negative.   Neurological:  Positive for speech difficulty.  Hematological: Negative.   Psychiatric/Behavioral: Negative.     12 point systems reviewed essentially  unremarkable except for that mentioned HPI.    Blood pressure (!) 165/79, pulse 85, temperature 98.1 F (36.7 C), temperature source Oral, resp. rate 17, height 5\' 10"  (1.778 m), weight 90 kg, SpO2 98 %. Physical Exam Vitals reviewed.  Constitutional:      Appearance: Normal appearance.  HENT:     Head: Normocephalic and atraumatic.     Nose: Nose normal.     Mouth/Throat:     Mouth: Mucous membranes are moist.  Eyes:     Pupils: Pupils are equal, round, and reactive to light.  Cardiovascular:     Rate and Rhythm: Normal rate and regular rhythm.  Pulmonary:     Effort: Pulmonary effort is normal.     Breath sounds: Normal breath sounds.  Abdominal:     General: Bowel sounds are normal.  Musculoskeletal:     Cervical back: Normal range of motion.  Neurological:     General: No focal deficit present.     Mental Status: He is alert and oriented to person, place, and time. Mental status is at baseline.     Sensory: Sensory deficit present.  Psychiatric:        Mood and Affect: Mood normal.        Behavior: Behavior normal.      Assessment/Plan  Acute TIA versus stroke. Symptoms have improved since admission but not at baseline. Initial CT scan was negative for any acute intracranial normality or hemorrhage. MRI and MRA of head and neck pending. Patient had been on aspirin + Plavix prior to presentation. We will continue with dual antiplatelet therapy, statins. Permissive hypertension for now until MRI results.  Echo with bubble study pending. Known history of CVA with residual right-sided weakness.Neurology was consulted in the ED. Recommendations noted.  Acute hyponatremia: Patient is euvolemic. Likely due to poor oral intake. We will send urine osmolality, serum osmolality and urine electrolytes. Patient was supplemented with IV normal saline.  Accelerated Hypertension: Present on admission.  Patient is on multiple antihypertensive medication including clonidine.  Patient is  high risk of rebound hypertension.  Blood pressure medications however on hold due to permissive hypertension.  IV hydralazine as needed if SBP greater than 200 and DBP greater than 147mmHg.  History of coronary disease: Patient is asymptomatic at this time.  Status post stent about 2 years ago.  Hyperlipidemia: Patient already on atorvastatin.  Dose of atorvastatin was increased to 80 mg daily.  Frontal headache:Present on admission.  No known prior history of migraine headaches.  Analgesics will be offered for headache with Tylenol.    Artist [place], MD 05/06/2020, 7:56 PM

## 2020-05-06 NOTE — ED Provider Notes (Signed)
[facility] Emergency Department Provider Note  ____________________________________________  Time seen: Approximately 4:56 PM  I have reviewed the triage vital signs and the nursing notes.   HISTORY  Chief Complaint Aphasia (Per triage rn )    HPI Cory [name] is a 80 y.o. male who presented to the emergency department with his wife for complaint of headache and intermittent aphasia.  Patient states that earlier today he was having aphasia and also noticed by his wife.  Exact time of onset was not clear.  When symptoms persisted patient decided to present to the emergency department for evaluation.  He denied any unilateral weakness, aphasia had been improving at this time.  He still complaining of a frontal headache at this time.  No visual changes.  No recent trauma to the head or neck.  Patient denies any chest pain, shortness of breath, abdominal pain, nausea vomiting.  Patient was immediately made a code stroke from the waiting room, rushed to CT scan and then placed in the ED.  Neurology was present in the ED on the patient's arrival and evaluated the patient immediately upon arrival to CT.  No medications for his complaint prior to arrival.  Patient does have a history of coronary artery disease, GERD, hyperlipidemia, hypertension, previous CVA.         Past Medical History:  Diagnosis Date  . CAD (coronary artery disease)   . GERD (gastroesophageal reflux disease)   . Heart murmur   . Hyperlipidemia   . Hypertension   . Stroke ([place])   . Vasovagal syncope     Patient Active Problem List   Diagnosis Date Noted  . Prediabetes 04/13/2020  . Osteoarthritis 04/13/2020  . Hypertension 04/13/2020  . CAD (coronary artery disease), native coronary artery 04/13/2020  . History of CVA with residual deficit 04/13/2020  . 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 Hiatal hernia 04/13/2020  . Seasonal  allergies 04/13/2020  . 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 Nocturia 04/13/2020  . Lower extremity edema 04/13/2020  . 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 BACK SURGERY    . CHOLECYSTECTOMY    . CORONARY ARTERY BYPASS GRAFT  2000  . NECK SURGERY      Prior to Admission medications   Medication Sig Start Date End Date Taking? Authorizing Provider  acetaminophen (TYLENOL) 500 MG tablet Take 1,000 mg by mouth at bedtime.    [provider]  amLODipine (NORVASC) 5 MG tablet Take 1 tablet (5 mg total) by mouth daily. 04/13/20   [name], MD  aspirin EC 81 MG tablet Take 1 tablet (81 mg total) by mouth daily. Swallow whole. 03/13/20   Martinique, Peter M, MD  atorvastatin (LIPITOR) 40 MG tablet Take 1 tablet (40 mg total) by mouth daily. 04/13/20   [name], MD  cloNIDine (CATAPRES) 0.1 MG tablet Take 1 tablet (0.1 mg total) by mouth 2 (two) times daily. 04/13/20   [name], MD  clopidogrel (PLAVIX) 75 MG tablet Take 1 tablet (75 mg total) by mouth daily. 04/13/20   [name], MD  cycloSPORINE (RESTASIS) 0.05 % ophthalmic emulsion 1 drop 2 (two) times a day.    [provider]  diclofenac Sodium (VOLTAREN) 1 % GEL Apply 2 g topically as needed. 07/22/17   [provider]  famotidine (PEPCID) 20 MG tablet Take 20 mg by  mouth at bedtime.    [provider]  fluticasone (FLONASE) 50 MCG/ACT nasal spray 1 spray by Each Nare route nightly.    [provider]  lisinopril (ZESTRIL) 10 MG tablet Take 1 tablet (10 mg total) by mouth daily. 04/13/20   [name], MD  melatonin 1 MG TABS tablet Take 3 mg by mouth at bedtime.    [provider]  metFORMIN (GLUCOPHAGE) 500 MG tablet Take 1 tablet (500 mg total) by mouth daily with breakfast. 04/13/20   [name], MD  montelukast (SINGULAIR) 10 MG tablet Take 1 tablet (10 mg total) by mouth at bedtime. 04/13/20    [name], MD  Multiple Vitamins-Minerals [facility] COMPLETE SENIOR MULTIVITS) TABS Take by mouth daily.    [provider]  nitroGLYCERIN (NITROSTAT) 0.4 MG SL tablet Place 0.4 mg under the tongue every 5 (five) minutes as needed.    [provider]  pantoprazole (PROTONIX) 40 MG tablet Take 1 tablet (40 mg total) by mouth 2 (two) times daily. 04/13/20   [name], MD    Allergies Fesoterodine, Isosorbide, Tramadol, Grass pollen([id number] vern), Hydrocodone, Meperidine, and Penicillins  Family History  Problem Relation Age of Onset  . Heart disease Mother   . Heart attack Father   . Heart disease Brother     Social History Social History   Tobacco Use  . Smoking status: Former Smoker    Types: Pipe  . Smokeless tobacco: Never Used  Substance Use Topics  . Alcohol use: Never  . Drug use: Yes     Review of Systems  Constitutional: No fever/chills Eyes: No visual changes. No discharge ENT: No upper respiratory complaints. Cardiovascular: no chest pain. Respiratory: no cough. No SOB. Gastrointestinal: No abdominal pain.  No nausea, no vomiting.  No diarrhea.  No constipation. Musculoskeletal: Negative for musculoskeletal pain. Skin: Negative for rash, abrasions, lacerations, ecchymosis. Neurological: Positive for headache.  Positive for intermittent aphasia that seems to be improving.  Denies focal weakness or numbness.  10 System ROS otherwise negative.  ____________________________________________   PHYSICAL EXAM:  VITAL SIGNS: ED Triage Vitals  Enc Vitals Group     BP 05/06/20 1644 (!) 175/83     Pulse Rate 05/06/20 1644 88     Resp 05/06/20 1644 17     Temp 05/06/20 1644 98.1 F (36.7 C)     Temp Source 05/06/20 1644 Oral     SpO2 05/06/20 1644 98 %     Weight 05/06/20 1646 198 lb 6.6 oz (90 kg)     Height 05/06/20 1646 5\' 10"  (1.778 m)     Head Circumference --      Peak Flow --      Pain Score 05/06/20 1645 0     Pain Loc --       Pain Edu? --      Excl. in [place]? --      Constitutional: Alert and oriented. Well appearing and in no acute distress. Eyes: Conjunctivae are normal. PERRL. EOMI. Head: Atraumatic. ENT:      Ears:       Nose: No congestion/rhinnorhea.      Mouth/Throat: Mucous membranes are moist.  Neck: No stridor.  Neck is supple [place] motion Hematological/Lymphatic/Immunilogical: No cervical lymphadenopathy. Cardiovascular: Normal rate, regular rhythm. Normal S1 and S2.  Good peripheral circulation. Respiratory: Normal respiratory effort without tachypnea or retractions. Lungs CTAB. Good air entry to the bases with no decreased or absent breath sounds. Musculoskeletal: Full  range of motion to all extremities. No gross deformities appreciated. Neurologic:  Normal speech and language. No gross focal neurologic deficits are appreciated. At this time there is no gross expressive aphasia. Patient is completing sentences appropriately at this time. Patient's cranial nerve testing is grossly reassuring cranial nerves II through XII grossly intact. Equal grip strength bilateral upper extremities. Skin:  Skin is warm, dry and intact. No rash noted. Psychiatric: Mood and affect are normal. Speech and behavior are normal. Patient exhibits appropriate insight and judgement.   ____________________________________________   LABS (all labs ordered are listed, but only abnormal results are displayed)  Labs Reviewed  COMPREHENSIVE METABOLIC PANEL - Abnormal; Notable for the following components:      Result Value   Sodium 130 (*)    Chloride 96 (*)    Glucose, Bld 139 (*)    All other components within normal limits  CBC WITH DIFFERENTIAL/PLATELET - Abnormal; Notable for the following components:   RBC 3.85 (*)    Hemoglobin 11.8 (*)    HCT 35.4 (*)    All other components within normal limits  CBG MONITORING, ED - Abnormal; Notable for the following components:   Glucose-Capillary 128 (*)    All other  components within normal limits  ETHANOL  PROTIME-INR  APTT  DIFFERENTIAL  URINE DRUG SCREEN, QUALITATIVE ([facility] ONLY)  URINALYSIS, ROUTINE W REFLEX MICROSCOPIC  PLATELET FUNCTION ASSAY   ____________________________________________  EKG   ____________________________________________  RADIOLOGY I personally viewed and evaluated these images as part of my medical decision making, as well as reviewing the written report by the radiologist.  ED Provider Interpretation: Concur with radiologist finding of no acute intracranial hemorrhage or ischemic changes. I discussed the results with the radiologist via telephone.  CT HEAD CODE STROKE WO CONTRAST`  Result Date: 05/06/2020 CLINICAL DATA:  Code stroke. Intermittent dysphagia with frontal headache EXAM: CT HEAD WITHOUT CONTRAST TECHNIQUE: Contiguous axial images were obtained from the base of the skull through the vertex without intravenous contrast. COMPARISON:  None. FINDINGS: Brain: There is no acute intracranial hemorrhage, mass effect or edema. No acute appearing loss of gray-white differentiation. There are chronic infarcts of the left thalamocapsular region and right cerebellum. Additional patchy and confluent areas of hypoattenuation in the supratentorial white matter are nonspecific but probably reflect moderate chronic microvascular ischemic changes. Prominence of the ventricles and sulci reflects generalized parenchymal volume loss. There is no extra-axial collection. Vascular: No hyperdense vessel. There is intracranial atherosclerotic calcification at the skull base. Skull: Unremarkable. Sinuses/Orbits: No acute abnormality. Other: Mastoid air cells are clear. ASPECTS ([place] Stroke Program Early CT Score) - Ganglionic level infarction (caudate, lentiform nuclei, internal capsule, insula, M1-M3 cortex): 7 - Supraganglionic infarction (M4-M6 cortex): 3 Total score (0-10 with 10 being normal): 10 IMPRESSION: There is no acute intracranial  hemorrhage or evidence of acute infarction. ASPECT score is 10. Moderate chronic microvascular ischemic changes. 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 These results were called by telephone at the time of interpretation on 05/06/2020 at 4:36 pm to provider Jene Every , who verbally acknowledged these results. Electronically Signed   By: Guadlupe Spanish M.D.   On: 05/06/2020 16:42    ____________________________________________    PROCEDURES  Procedure(s) performed:    .Critical Care Performed by: Racheal Patches, PA-C Authorized by: Racheal Patches, PA-C   Critical care provider statement:    Critical care time (minutes):  40   Critical care time was exclusive of:  Separately billable procedures and  treating other patients and teaching time   Critical care was necessary to treat or prevent imminent or life-threatening deterioration of the following conditions:  CNS failure or compromise   Critical care was time spent personally by me on the following activities:  Obtaining history from patient or surrogate, examination of patient, evaluation of patient's response to treatment, discussions with consultants, development of treatment plan with patient or surrogate, ordering and performing treatments and interventions, ordering and review of radiographic studies, re-evaluation of patient's condition and review of old charts   Care discussed with: admitting provider        Medications  aspirin EC tablet 325 mg (325 mg Oral Given 05/06/20 1659)     ____________________________________________   INITIAL IMPRESSION / ASSESSMENT AND PLAN / ED COURSE  Pertinent labs & imaging results that were available during my care of the patient were reviewed by me and considered in my medical decision making (see chart for details).  Review of the [place] CSRS was performed in accordance of the [place] prior to dispensing any controlled drugs.        Neurology saw  patient in the ED and recommends following:  Plan: - MRI brain without contrast. -Recommend vascular imagingwith MRA head and neck. -RecommendTTE. -Recommend labs: HbA1c, lipid panel, TSH. -RecommendStatinif LDL > 70 - Aspirin 81mg  daily. - Clopidogrel 75mg  daily for 3 weeks. - Permissive hypertension first 24 h < 220/110.  - Telemetry monitoring for arrhythmia. -Recommend bedsideSwallow screen. -RecommendStroke education. -RecommendPT/OT/SLP consult.     Patient's diagnosis is consistent with expressive aphasia, as well as TIA versus CVA depending on results of further work-up. Patient presented to the emergency department with expressive aphasia that was intermittent this morning. Patient developed intermittent expressive aphasia with a headache. Patient has had a previous CVA which presented with similar symptoms. Patient was concerned his symptoms did not initially seem to improve so he presented to the ED. Currently patient states that his expressive aphasia has greatly improved and his headache is manageable at this time.  Patient's wife who is also present concurs that patient seems much improved currently.  Patient presented with symptoms, was immediately taken from the waiting room to CT scan for CT of the head. I discussed the results with the radiologist where there was no evidence of acute hemorrhage or ischemic changes. Patient does seem to be improving symptomatically according to the patient and his wife. Neuro exam is reassuring currently. Neurology was present from the time the patient arrived in CT and evaluated the patient in person here in the emergency department. Recommendations were to give the patient a loading dose of aspirin 325 mg but no TPA currently.  Further recommendations were MRI and MRA of the head and neck.  Patient will be followed by neurology while admitted to the hospitalist service.  At this time again patient has a mild headache but the  expressive aphasia has improved and patient is relatively back at his baseline..  Patient will be admitted to the hospital service at this time . Patient is given ED precautions to return to the ED for any worsening or new symptoms.     ____________________________________________  FINAL CLINICAL IMPRESSION(S) / ED DIAGNOSES  Final diagnoses:  Expressive aphasia      NEW MEDICATIONS STARTED DURING THIS VISIT:  ED Discharge Orders    None          This chart was dictated using voice recognition software/Dragon. Despite best efforts to proofread, errors can occur which  can change the meaning. Any change was purely unintentional.    Darletta Moll, PA-C 05/06/20 1839    Nance Pear, MD 05/06/20 [id number]

## 2020-05-06 NOTE — Consult Note (Signed)
Neurology Consult H&P  CC: aphasia  History is obtained from: patient and his wife.  HPI: Cory [name] is a 80 y.o. male right handed previous history of stroke with residual right sided deficits noted to have aphasia earlier today however onset is not clear.   His wife stated that he presented with aphasia with his previous stroke.   Headache 7-8/10 frontaal pressure like non-radiating.  LKW: unclear tpa given?: No, resolved. IR Thrombectomy? No, symptoms resolved. Modified Rankin Scale: 0-Completely asymptomatic and back to baseline post- stroke NIHSS: 0  ROS: A complete ROS was performed and is negative except as noted in the HPI.   Past Medical History:  Diagnosis Date  . CAD (coronary artery disease)   . GERD (gastroesophageal reflux disease)   . Heart murmur   . Hyperlipidemia   . Hypertension   . Stroke ([place])   . Vasovagal syncope    Family History  Problem Relation Age of Onset  . Heart disease Mother   . Heart attack Father   . Heart disease Brother    Social History:  reports that he has quit smoking. His smoking use included pipe. He has never used smokeless tobacco. He reports current drug use. He reports that he does not drink alcohol.  Prior to Admission medications   Medication Sig Start Date End Date Taking? Authorizing Provider  acetaminophen (TYLENOL) 500 MG tablet Take 1,000 mg by mouth at bedtime.    [provider]  amLODipine (NORVASC) 5 MG tablet Take 1 tablet (5 mg total) by mouth daily. 04/13/20   [name], MD  aspirin EC 81 MG tablet Take 1 tablet (81 mg total) by mouth daily. Swallow whole. 03/13/20   [place], Peter M, MD  atorvastatin (LIPITOR) 40 MG tablet Take 1 tablet (40 mg total) by mouth daily. 04/13/20   [name], MD  cloNIDine (CATAPRES) 0.1 MG tablet Take 1 tablet (0.1 mg total) by mouth 2 (two) times daily. 04/13/20   [name], MD  clopidogrel (PLAVIX) 75 MG tablet Take 1 tablet (75 mg total) by  mouth daily. 04/13/20   [name], MD  cycloSPORINE (RESTASIS) 0.05 % ophthalmic emulsion 1 drop 2 (two) times a day.    [provider]  diclofenac Sodium (VOLTAREN) 1 % GEL Apply 2 g topically as needed. 07/22/17   [provider]  famotidine (PEPCID) 20 MG tablet Take 20 mg by mouth at bedtime.    [provider]  fluticasone (FLONASE) 50 MCG/ACT nasal spray 1 spray by Each Nare route nightly.    [provider]  lisinopril (ZESTRIL) 10 MG tablet Take 1 tablet (10 mg total) by mouth daily. 04/13/20   [name], MD  melatonin 1 MG TABS tablet Take 3 mg by mouth at bedtime.    [provider]  metFORMIN (GLUCOPHAGE) 500 MG tablet Take 1 tablet (500 mg total) by mouth daily with breakfast. 04/13/20   [name], MD  montelukast (SINGULAIR) 10 MG tablet Take 1 tablet (10 mg total) by mouth at bedtime. 04/13/20   [name], MD  Multiple Vitamins-Minerals [facility] COMPLETE SENIOR MULTIVITS) TABS Take by mouth daily.    [provider]  nitroGLYCERIN (NITROSTAT) 0.4 MG SL tablet Place 0.4 mg under the tongue every 5 (five) minutes as needed.    [provider]  pantoprazole (PROTONIX) 40 MG tablet Take 1 tablet (40 mg total) by mouth 2 (two) times daily. 04/13/20  [name], MD    Exam: Current vital signs: BP (!) 175/83 (BP Location: Right Arm)   Pulse 88   Temp 98.1 F (36.7 C) (Oral)   Resp 17   Ht 5\' 10"  (1.778 m)   Wt 90 kg   SpO2 98%   BMI 28.47 kg/m   Physical Exam  Constitutional: Appears well-developed and well-nourished.  Psych: Affect appropriate to situation Eyes: No scleral injection HENT: No OP obstrucion Head: Normocephalic.  Cardiovascular: Normal rate and regular rhythm.  Respiratory: Effort normal and breath sounds normal to anterior ascultation GI: Soft.  No distension. There is no tenderness.  Skin: WDI  Neuro: Mental Status: Patient is awake, alert, oriented to person,  place, month, year, and situation. Patient is able to give a clear and coherent history. No signs of aphasia or neglect. Cranial Nerves: II: Visual Fields are full. Pupils are equal, round, and reactive to light. [name],IV, VI: EOMI without ptosis or diploplia.  V: Facial sensation is symmetric to temperature VII: Facial movement is symmetric.  VIII: hearing is intact to voice X: Uvula elevates symmetrically XI: Shoulder shrug is symmetric. XII: tongue is midline without atrophy or fasciculations.  Motor: Tone is normal. Bulk is normal. 5/5 strength was present in all four extremities. Sensory: Sensation is symmetric to light touch and temperature in the arms and legs. Deep Tendon Reflexes: 2+ and symmetric in the biceps and patellae. Plantars: Toes are downgoing bilaterally. Cerebellar: FNF and HKS are intact bilaterally.  I have reviewed labs in epic and the pertinent results are:  Fingerstick Glu 128  I have reviewed the images obtained: NCT head showed did not show acute ischemic changes chronic left thalamocapsular and right cerebellar infarcts..  Assessment: Cory [name] is a 80 y.o. male right handed PMHx stroke with mild residual right sided deficits with aphasia which has resolved. Per wife, the patient is back to his baseline except for headache.   Aspirin 324mg  now.  Impression:  TIA versus stroke Chronic stroke with residual right sided deficits.  Plan: - MRI brain without contrast. - Recommend vascular imaging with MRA head and neck. - Recommend TTE. - Recommend labs: HbA1c, lipid panel, TSH.  -Platelet function assay - ordered. - Recommend Statin if LDL > 70 - Aspirin 81mg  daily. - Clopidogrel 75mg  daily for 3 weeks. - Permissive hypertension first 24 h < 220/110.  - Telemetry monitoring for arrhythmia. - Recommend bedside Swallow screen. - Recommend Stroke education. - Recommend PT/OT/SLP consult.  This patient is critically ill and at  significant risk of neurological worsening, death and care requires constant monitoring of vital signs, hemodynamics,respiratory and cardiac monitoring, neurological assessment, discussion with family, other specialists and medical decision making of high complexity. I spent 73 minutes of neurocritical care time  in the care of  this patient. This was time spent independent of any time provided by nurse practitioner or PA.  Electronically signed by:  Lynnae Sandhoff, MD Page: [phone] 05/06/2020, 4:57 PM

## 2020-05-06 NOTE — ED Notes (Signed)
Per provider patient can have food to eat.

## 2020-05-07 ENCOUNTER — Observation Stay (HOSPITAL_BASED_OUTPATIENT_CLINIC_OR_DEPARTMENT_OTHER)
Admit: 2020-05-07 | Discharge: 2020-05-07 | Disposition: A | Discharge status: Home / Self Care | Payer: Medicare Other | Attending: Internal Medicine | Admitting: Internal Medicine

## 2020-05-07 ENCOUNTER — Encounter: Payer: Self-pay | Admitting: Internal Medicine

## 2020-05-07 ENCOUNTER — Observation Stay: Payer: Medicare Other

## 2020-05-07 DIAGNOSIS — I361 Nonrheumatic tricuspid (valve) insufficiency: Secondary | ICD-10-CM

## 2020-05-07 DIAGNOSIS — I6930 Unspecified sequelae of cerebral infarction: Secondary | ICD-10-CM | POA: Diagnosis not present

## 2020-05-07 DIAGNOSIS — I671 Cerebral aneurysm, nonruptured: Secondary | ICD-10-CM | POA: Diagnosis not present

## 2020-05-07 DIAGNOSIS — G459 Transient cerebral ischemic attack, unspecified: Secondary | ICD-10-CM

## 2020-05-07 DIAGNOSIS — R4701 Aphasia: Secondary | ICD-10-CM | POA: Diagnosis not present

## 2020-05-07 DIAGNOSIS — I340 Nonrheumatic mitral (valve) insufficiency: Secondary | ICD-10-CM

## 2020-05-07 DIAGNOSIS — I2510 Atherosclerotic heart disease of native coronary artery without angina pectoris: Secondary | ICD-10-CM | POA: Diagnosis not present

## 2020-05-07 LAB — BASIC METABOLIC PANEL
Anion gap: 9 (ref 5–15)
BUN: 9 mg/dL (ref 8–23)
CO2: 24 mmol/L (ref 22–32)
Calcium: 9 mg/dL (ref 8.9–10.3)
Chloride: 102 mmol/L (ref 98–111)
Creatinine, Ser: 0.71 mg/dL (ref 0.61–1.24)
GFR, Estimated: >60 mL/min (ref >60)
Glucose, Bld: 99 mg/dL (ref 70–99)
Potassium: 4 mmol/L (ref 3.5–5.1)
Sodium: 135 mmol/L (ref 135–145)

## 2020-05-07 LAB — PHOSPHORUS: Phosphorus: 3.9 mg/dL (ref 2.5–4.6)

## 2020-05-07 LAB — MAGNESIUM: Magnesium: 2 mg/dL (ref 1.7–2.4)

## 2020-05-07 LAB — ECHOCARDIOGRAM COMPLETE
AR max vel: 1.99 cm2
AV Peak grad: 8.4 mmHg
Ao pk vel: 1.45 m/s
Area-P 1/2: 3.83 cm2
Height: 70 in
S' Lateral: 2.9 cm
Weight: 3174.62 oz

## 2020-05-07 LAB — HEMOGLOBIN A1C
Hgb A1c MFr Bld: 6 % — ABNORMAL HIGH (ref 4.8–5.6)
Mean Plasma Glucose: 125.5 mg/dL

## 2020-05-07 LAB — LIPID PANEL
Cholesterol: 116 mg/dL (ref 0–200)
HDL: 40 mg/dL — ABNORMAL LOW (ref >40)
LDL Cholesterol: 56 mg/dL (ref 0–99)
Total CHOL/HDL Ratio: 2.9 RATIO
Triglycerides: 101 mg/dL (ref <150)
VLDL: 20 mg/dL (ref 0–40)

## 2020-05-07 MED ORDER — IOHEXOL 350 MG/ML SOLN
75.0000 mL | Freq: Once | INTRAVENOUS | Status: AC | PRN
  Administered 2020-05-07: 75 mL via INTRAVENOUS

## 2020-05-07 MED ORDER — MELATONIN 5 MG PO TABS: 2.5000 mg | ORAL_TABLET | Freq: Every day | ORAL | Status: DC

## 2020-05-07 NOTE — Evaluation (Signed)
Physical Therapy Evaluation Patient Details Name: Cory [name] MRN: [id number] DOB: [birth year] Today's Date: 05/07/2020   History of Present Illness  Cory [name] is an 80 y.o. male with prior history of CVA with residual right-sided weakness.  Other comorbidities include coronary disease status post CABG, hypertension, hyperlipidemia and GERD. Patient has been in his usual state of health until earlier today when wife had reported patient to have some evidence of expressive aphasia.  Symptoms were associated with frontal headaches, described pain as 3/10 intensity and nonradiating. Denied any prior history of migraine headaches. As per patient, he started experiencing intermittent symptoms over the past couple of days.Symptoms would resolve on its own.   Pt did well with PT exam, was eager and willing to do some activity and showed ability to safely manage in-home and short community distances.  He is currently seeing outpt PT for R knee pain/gait training and this PT fully endorses continuation of this.  He was able to ambulate ~150 ft this session with no AD, though he did show increased speed, confidence and cadence with light UE use on IV pole.  Discussed appropriate use of ADs moving forward.  Will maintain on PT caseload while here at [facility] to insure/maintain activity level but it is safe to return home once medically cleared for d/c.     Follow Up Recommendations Outpatient PT (per current status)    Equipment Recommendations  None recommended by PT    Recommendations for Other Services       Precautions / Restrictions Precautions Precautions: None Restrictions Weight Bearing Restrictions: No      Mobility  Bed Mobility Overal bed mobility: Independent                  Transfers Overall transfer level: Independent Equipment used: None             General transfer comment: Pt did well with mobility showing good confidence with transitions, minimal c/o  pain in R feel with WBing but safe and effecitve with transfers  Ambulation/Gait Ambulation/Gait assistance: Supervision Gait Distance (Feet): 150 Feet Assistive device: None;IV Pole       General Gait Details: Pt with safe and relatively consistent cadence, does have mild limp/hesitation with R WBing and ankle mechanics that he reports is essentially baseline (has been seeing outpt PT to further address this).  Educated on appropriate AD use, trial of using IV pole as cane simulation did increase speed and apparent confidence with R WBing.  Pt on room air with the effort, sats generally staying in the mid/low 90s t/o the effort w/o c/o excessive fatigue, etc  Stairs            Wheelchair Mobility    Modified Rankin (Stroke Patients Only)       Balance Overall balance assessment: Modified Independent                                           Pertinent Vitals/Pain Pain Assessment:  (endorses acute on chronic R heel pain)    Home Living Family/patient expects to be discharged to:: Private residence Living Arrangements: Spouse/significant other Available Help at Discharge: Family   Home Access: Other (comment)   Entrance Stairs-Number of Steps: small threshold   Home Equipment: Walker - 2 wheels;Cane - single point      Prior Function Level of Independence:  Independent         Comments: Pt able to walk his dog, do light yard work, Social research officer, government - has 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) with occasional need for cane/AD     Hand Dominance   Dominant Hand: Right    Extremity/Trunk Assessment   Upper Extremity Assessment Upper Extremity Assessment: Overall WFL for tasks assessed (h/o minimal R sided weakness 2/2 remote CVA)    Lower Extremity Assessment Lower Extremity Assessment: Overall WFL for tasks assessed (h/o minimal R sided weakness 2/2 remote CVA, lacks ~2 deg of knee ext with some numbness/proprioceptive loss in R foot/ankle)        Communication   Communication: No difficulties  Cognition Arousal/Alertness: Awake/alert Behavior During Therapy: WFL for tasks assessed/performed Overall Cognitive Status: Within Functional Limits for tasks assessed                                        General Comments      Exercises     Assessment/Plan    PT Assessment Patient needs continued PT services  PT Problem List Decreased strength;Decreased range of motion;Decreased activity tolerance;Decreased balance;Decreased knowledge of use of DME;Pain       PT Treatment Interventions DME instruction;Gait training;Functional mobility training;Therapeutic activities;Balance training;Therapeutic exercise;Patient/family education    PT Goals (Current goals can be found in the Care Plan section)  Acute Rehab PT Goals Patient Stated Goal: go home PT Goal Formulation: With patient Time For Goal Achievement: 05/07/20 Potential to Achieve Goals: Good    Frequency Min 2X/week   Barriers to discharge        Co-evaluation               AM-PAC PT "6 Clicks" Mobility  Outcome Measure Help needed turning from your back to your side while in a flat bed without using bedrails?: None Help needed moving from lying on your back to sitting on the side of a flat bed without using bedrails?: None Help needed moving to and from a bed to a chair (including a wheelchair)?: None Help needed standing up from a chair using your arms (e.g., wheelchair or bedside chair)?: A Little Help needed to walk in hospital room?: A Little Help needed climbing 3-5 steps with a railing? : A Little 6 Click Score: 21    End of Session   Activity Tolerance: Patient tolerated treatment well;Patient limited by fatigue Patient left: in bed;with call bell/phone within reach Nurse Communication: Mobility status PT Visit Diagnosis: Muscle weakness (generalized) (M62.81);Other abnormalities of gait and mobility (R26.89)    Time:  0832-0900 PT Time Calculation (min) (ACUTE ONLY): 28 min   Charges:   PT Evaluation $PT Eval Low Complexity: 1 Low PT Treatments $Gait Training: 8-22 mins        Kreg Shropshire, DPT 05/07/2020, 10:41 AM

## 2020-05-07 NOTE — ED Notes (Signed)
Medication adminsitered as ordered. Pt tolerated well. Pt ate 100% of breakfast tray at this time. Pt repositioned in bed for comfort. Pt denies further needs. Call bell within reach of patient. Pt currently sitting up in bed watching TV.

## 2020-05-07 NOTE — ED Notes (Signed)
PT at bedside at this time to work with patient.  

## 2020-05-07 NOTE — Evaluation (Signed)
Occupational Therapy Evaluation Patient Details Name: Cory [name] MRN: [id number] DOB: 1940-06-06 Today's Date: 05/07/2020    History of Present Illness Cory [name] is an 80 y.o. male with prior history of CVA with residual right-sided weakness.  Other comorbidities include coronary disease status post CABG, hypertension, hyperlipidemia and GERD. Patient has been in his usual state of health until earlier today when wife had reported patient to have some evidence of expressive aphasia.  Symptoms were associated with frontal headaches, described pain as 3/10 intensity and nonradiating. Denied any prior history of migraine headaches. As per patient, he started experiencing intermittent symptoms over the past couple of days.Symptoms would resolve on its own.   Clinical Impression   Cory [name] presents with chronic sensation impairments and R side weakness, but appears to be at his baseline level of functioning prior to TIA.  Prior to admission, pt was independent in all ADLs, occasionally using SPC for mobility.  He enjoys walking his dog and doing light yard work.  He still drives and is independent in med management.  Currently, pt is able to complete all basic ADLs, including functional mobility, with modified independence.  OTR provided supervision assist for pt to complete bed mobility/sit to stand transfer, as well as toileting.  Pt denies any vision changes, able to complete finger/thumb opposition with good coordination, and denies any new numbness/tingling in extremities (has baseline numbness in R foot from previous CVA).  Do not anticipate any further need for skilled OT services in acute setting as pt is at his baseline level of functioning.  Will discharge pt from OT caseload, please re-consult if pt has change in functional status or additional OT-related needs arise.    Follow Up Recommendations  No OT follow up    Equipment Recommendations  None recommended by OT     Recommendations for Other Services       Precautions / Restrictions Precautions Precautions: None Restrictions Weight Bearing Restrictions: No      Mobility Bed Mobility Overal bed mobility: Independent                  Transfers Overall transfer level: Independent Equipment used: None             General transfer comment: Pt did well with mobility showing good confidence with transitions, minimal c/o pain in R feel with WBing but safe and effecitve with transfers    Balance Overall balance assessment: Modified Independent                                         ADL either performed or assessed with clinical judgement   ADL Overall ADL's : At baseline                                       General ADL Comments: Pt able to complete basic ADLs with modified independence.  OTR provided supervision assist for bed mobility and functional transfers, as well as toileting.     Vision Baseline Vision/History: Wears glasses Wears Glasses: At all times Patient Visual Report: No change from baseline Additional Comments: pt reports declining vision, but no acute changes     Perception     Praxis      Pertinent Vitals/Pain Pain Assessment: No/denies pain  Hand Dominance Right   Extremity/Trunk Assessment Upper Extremity Assessment Upper Extremity Assessment: Overall WFL for tasks assessed (h/o minimal R sided weakness 2/2 remote CVA)   Lower Extremity Assessment Lower Extremity Assessment: Overall WFL for tasks assessed (h/o minimal R sided weakness 2/2 remote CVA, lacks ~2 deg of knee ext with some numbness/proprioceptive loss in R foot/ankle)       Communication Communication Communication: No difficulties   Cognition Arousal/Alertness: Awake/alert Behavior During Therapy: WFL for tasks assessed/performed Overall Cognitive Status: Within Functional Limits for tasks assessed                                      General Comments  HR and SpO2 stable throughout evaluation.    Exercises Other Exercises Other Exercises: provided education re: OT role and plan of care, fall and safety precautions, self care, discharge recommendations   Shoulder Instructions      Home Living Family/patient expects to be discharged to:: Private residence Living Arrangements: Spouse/significant other Available Help at Discharge: Family Type of Home: House Home Access: Other (comment) Entrance Stairs-Number of Steps: small threshold   Home Layout: Two level;Able to live on main level with bedroom/bathroom Alternate Level Stairs-Number of Steps: pt's guest bedroom/bonus room are upstairs   Bathroom Shower/Tub: Tub/shower unit;Walk-in shower (pt uses walk in shower)   Bathroom Toilet: Handicapped height     Home Equipment: Environmental consultant - 2 wheels;Cane - single point;Shower seat;Grab bars - tub/shower   Additional Comments: pt's grab bars in shower are suction cup      Prior Functioning/Environment Level of Independence: Independent        Comments: 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  Pt typically completes basic ADLs independently.        OT Problem List: Impaired sensation      OT Treatment/Interventions:      OT Goals(Current goals can be found in the care plan section) Acute Rehab OT Goals Patient Stated Goal: to go home OT Goal Formulation: With patient Potential to Achieve Goals: Good  OT Frequency:     Barriers to D/C:            Co-evaluation              AM-PAC OT "6 Clicks" Daily Activity     Outcome Measure Help from another person eating meals?: None Help from another person taking care of personal grooming?: None Help from another person toileting, which includes using toliet, bedpan, or urinal?: None Help from another person bathing (including washing, rinsing, drying)?:  None Help from another person to put on and taking off regular upper body clothing?: None Help from another person to put on and taking off regular lower body clothing?: None 6 Click Score: 24   End of Session    Activity Tolerance: Patient tolerated treatment well Patient left: in bed;with call bell/phone within reach  OT Visit Diagnosis: Other symptoms and signs involving the nervous system (Q11.941)                Time: 7408-1448 OT Time Calculation (min): 17 min Charges:  OT General Charges $OT Visit: 1 Visit OT Evaluation $OT Eval Low Complexity: 1 Low OT Treatments $Self Care/Home Management : 8-22 mins  Myrtie Hawk [employer], OTR/L 05/07/20, 4:28 PM

## 2020-05-07 NOTE — Progress Notes (Signed)
Brief Neurology Progress Note   Labs  Last lipids Lab Results  Component Value Date   CHOL 116 05/07/2020   HDL 40 (L) 05/07/2020   LDLCALC 56 05/07/2020   TRIG 101 05/07/2020   CHOLHDL 2.9 05/07/2020   Lab Results  Component Value Date   HGBA1C 6.0 (H) 05/07/2020   Imaging I have reviewed images in epic and the results pertinent to this consultation are: MRI brain showd no acute intracranial abnormality.  Moderately advanced age-related cerebral atrophy and moderate chronic small vessel ischemic disease, with multiple remote lacunar infarcts involving the bilateral basal ganglia, hemispheric cerebral white matter, and pons. 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 3 mm cavernous left ICA aneurysm.   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 Mild atheromatous plaque about the left carotid artery system without sonographic evidence for hemodynamically significant stenosis.  Assessment: Cory [name] is a 80 y.o. male right handed PMHx stroke with mild residual right sided deficits with aphasia which has resolved on arrival.  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 No other intracranial aneurysms identified. The stratified rupture risk PHASES 2 with a 0.4% 5-year risk of rupture however location would make intervention difficult.  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 - Will need vascular surgery consult to evaluate carotid stenosis -TTE - Pending. -Continue statin LDL at goal <70. - A1c at goal. - Continue aspirin 81mg  daily. - Continue clopidogrel 75mg  daily. - Gradually decrease BP <140/90.  - Telemetry  monitoring for arrhythmia. -Recommend bedsideSwallow screen. -RecommendStroke education. -RecommendPT/OT/SLP consult. - He may call Dr. Arlean Hopping office at [phone] regarding aneurysm to schedule appointment for follow up monitoring.  Lynnae Sandhoff, MD Page: [phone]

## 2020-05-07 NOTE — ED Notes (Incomplete)
/*-/  00`5110` Q

## 2020-05-07 NOTE — Discharge Summary (Signed)
[facility] Discharge Summary   Patient: Cory [name]:[id number]  PCP: [name], MD  Date of admission: 05/06/2020   Date of discharge:  05/07/2020     Discharge Diagnoses:   Active Problems:   TIA (transient ischemic attack)   Admitted From: Home Disposition:  Home with outpatient PT  Recommendations for Outpatient Follow-up:  1. PCP: In 1 week 2. Follow-up with a urologist in 1 to 2 weeks 3. Follow-up with neurosurgery in 1 to 2 weeks 4. Follow up LABS/TEST:     Diet recommendation: Cardiac diet  Activity: The patient is advised to gradually reintroduce usual activities, as tolerated  Discharge Condition: stable  Code Status: Full code   History of present illness: As per the H and P dictated on admission Hospital Course:  Cory [name] is an 80 y.o. male. He is right-handed male with prior history of CVA with residual right-sided weakness.  Other comorbidities include coronary disease status post CABG, hypertension, hyperlipidemia and GERD. Patient has been in his usual state of health until earlier today when wife had reported patient to have some evidence of expressive aphasia.  Symptoms were associated with frontal headaches, described pain as 3/10 intensity and nonradiating. Denied any prior history of migraine headaches. As per patient, he started experiencing intermittent symptoms over the past couple of days.Symptoms would resolve on its own. Patient was brought into the emergency room but was deemed not a candidate for TPA. By the time the patient arrived, symptoms had improved and was scored 0 for NIHSS score. Patient was seen in the ED by neurology consult, initial impression is TIA. Patient being admitted for 23 observation for further work-up and management. Assessment and plan Acute TIA versus stroke. Symptoms have improved since admission and back to baseline. Initial CT scan was negative for any acute intracranial normality or hemorrhage.  MRI and MRA of head and neck done and reviewed by neurologist, showed incidental finding of small aneurysm and recommended to follow with neurosurgery as an outpatient.  No any major vessel occlusion.. Patient had been on aspirin + Plavix prior to presentation. Resumed dual antiplatelet therapy, statins. Permissive hypertension during hospital stay.  Echo negative for, LVEF within normal limits, no any wall motion abnormality ER intracardiac embolic source.   Patient has known history of CVA with residual right-sided weakness.Neurology was consulted in the ED. patient was cleared by neurologist to discharge home and follow-up as an outpatient.  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 Acute hyponatremia: Patient is euvolemic. Likely due to poor oral intake.  Serum yesterday to 88 within normal range.  Sodium level improved 135 on discharge. Accelerated Hypertension: Present on admission.  Patient is on multiple antihypertensive medication including clonidine.  Patient was high risk of rebound hypertension.  Blood pressure medications however held due to permissive hypertension.  IV hydralazine as needed if SBP greater than 200 and DBP greater than 136mmHg.  Blood pressure was stable for discharge, resumed home medications and patient was advised to monitor BP and follow with PCP. History of coronary disease: Patient is asymptomatic at this time.  Status post stent about 2 years ago. Hyperlipidemia: Patient already on atorvastatin.  Dose of atorvastatin was increased to 80 mg daily. Frontal headache:Present on admission.  No known  prior history of migraine headaches.    Resolved after Tylenol.  Patient was asymptomatic at the time of discharge. Body mass index is  28.47 kg/m.    Patient was ambulatory without any assistance. Patient was seen by physical therapy, who recommended outpatient PT, which was arranged. On the day of the discharge the patient's vitals were stable, and no other acute medical condition were reported by patient. the patient was felt safe to be discharge at Home with out pt PT.  Consultants: Neurology Procedures: None  Discharge Exam: General: Appear in no distress, no Rash; Oral Mucosa Clear, moist. Cardiovascular: S1 and S2 Present, no Murmur, Respiratory: normal respiratory effort, Bilateral Air entry present and no Crackles, no wheezes Abdomen: Bowel Sound present, Soft and no tenderness, no hernia Extremities: no Pedal edema, no calf tenderness Neurology: alert and oriented to time, place, and person affect appropriate.  Filed Weights   05/06/20 1646  Weight: 90 kg   Vitals:   05/07/20 1314 05/07/20 1500  BP: (!) 145/79 (!) 143/74  Pulse: 85 77  Resp: 18 17  Temp:    SpO2: 98% 97%    DISCHARGE MEDICATION: Allergies as of 05/07/2020      Reactions   Fesoterodine Other (See Comments)   Dry mouth and constipation   Isosorbide Other (See Comments)   Tramadol Other (See Comments)   Grass Pollen(k-o-[id number]-swt Vern) Other (See Comments)   Hydrocodone Anxiety   Meperidine Other (See Comments), Rash   Other reaction(s): Other (See Comments) HALLUCINATIONS   Penicillins Rash      Medication List    TAKE these medications   acetaminophen 500 MG tablet Commonly known as: TYLENOL Take 1,000 mg by mouth at bedtime.   amLODipine 5 MG tablet Commonly known as: NORVASC Take 1 tablet (5 mg total) by mouth daily.   aspirin EC 81 MG tablet Take 1 tablet (81 mg total) by mouth daily. Swallow whole.   atorvastatin 40 MG tablet Commonly known as: LIPITOR Take 1 tablet (40 mg total) by mouth daily.   cloNIDine 0.1 MG tablet Commonly known as: CATAPRES Take 1 tablet (0.1 mg total) by mouth 2 (two) times  daily.   clopidogrel 75 MG tablet Commonly known as: PLAVIX Take 1 tablet (75 mg total) by mouth daily.   cycloSPORINE 0.05 % ophthalmic emulsion Commonly known as: RESTASIS 1 drop 2 (two) times a day.   diclofenac Sodium 1 % Gel Commonly known as: VOLTAREN Apply 2 g topically as needed.   famotidine 20 MG tablet Commonly known as: PEPCID Take 20 mg by mouth at bedtime.   fluticasone 50 MCG/ACT nasal spray Commonly known as: FLONASE 1 spray by Each Nare route nightly.   lisinopril 10 MG tablet Commonly known as: ZESTRIL Take 1 tablet (10 mg total) by mouth daily.   melatonin 1 MG Tabs tablet Take 3 mg by mouth at bedtime.   metFORMIN 500 MG tablet Commonly known as: GLUCOPHAGE Take 1 tablet (500 mg total) by mouth daily with breakfast.   montelukast 10 MG tablet Commonly known as: SINGULAIR Take 1 tablet (10 mg total) by mouth at bedtime.   nitroGLYCERIN 0.4 MG SL tablet Commonly known as: NITROSTAT Place 0.4 mg under the tongue every 5 (five) minutes as needed.   pantoprazole 40 MG tablet Commonly known as: PROTONIX Take 1 tablet (40 mg total) by mouth 2 (two) times daily.   PX Complete Senior Multivits Tabs Take by mouth daily.      Allergies  Allergen Reactions  .  Fesoterodine Other (See Comments)    Dry mouth and constipation  . Isosorbide Other (See Comments)  . Tramadol Other (See Comments)  . Grass Pollen(K-O-R-T-Swt Vern) Other (See Comments)  . Hydrocodone Anxiety  . Meperidine Other (See Comments) and Rash    Other reaction(s): Other (See Comments) HALLUCINATIONS   . 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 Arlean Hopping office at  [phone] regarding aneurysm to schedule appointment for follow up monitoring.   Diet - low sodium heart healthy   Complete by: As directed    Discharge instructions   Complete by: As directed    He may call Dr. Arlean Hopping office at [phone] regarding aneurysm to schedule appointment for follow up monitoring.   Increase activity slowly   Complete by: As directed       The results of significant diagnostics from this hospitalization (including imaging, microbiology, ancillary and laboratory) are listed below for reference.    Significant Diagnostic Studies: CT ANGIO NECK W OR WO CONTRAST  Result Date: 05/07/2020 CLINICAL DATA:  History of stroke, carotid stenosis on ultrasound EXAM: CT ANGIOGRAPHY NECK TECHNIQUE: Multidetector CT imaging of the neck was performed using the standard protocol during bolus administration of intravenous contrast. Multiplanar CT image reconstructions and MIPs were obtained to evaluate the vascular anatomy. Carotid stenosis measurements (when applicable) are obtained utilizing NASCET criteria, using the distal internal carotid diameter as the denominator. CONTRAST:  63mL OMNIPAQUE IOHEXOL 350 MG/ML SOLN COMPARISON:  None. FINDINGS: Aortic arch: Mild plaque along the arch and at the patent great vessel origins. Right carotid system: Common carotid is patent with minimal calcified plaque. Internal and external carotid arteries are patent. Extensive calcified plaque at the bifurcation and ICA origin resulting in approximately 60% stenosis (exact measurement is limited by artifact from calcification. Left carotid system: Patent. Common carotid is patent with minimal calcified plaque. Internal and external carotid arteries are patent. There is mild calcified plaque at the ICA origin without measurable stenosis. Additional mild mixed plaque the distal cervical segment with minimal stenosis. Vertebral arteries: Patent. Left vertebral artery is dominant. No measurable stenosis.  Skeleton: Multilevel degenerative changes of the cervical spine. Other neck: No mass or adenopathy. Upper chest: No apical lung mass. IMPRESSION: Heavy calcified plaque at the right common carotid bifurcation and ICA origin resulting in approximately 60% stenosis. Exact measurement is limited by streak artifact from calcification. Mild calcified plaque at the left ICA origin without measurable stenosis. Electronically Signed   By: Macy Mis M.D.   On: 05/07/2020 14:04   MR ANGIO HEAD WO CONTRAST  Result Date: 05/06/2020 CLINICAL DATA:  Initial evaluation for acute TIA, aphasia. EXAM: MRI HEAD WITHOUT CONTRAST MRA HEAD WITHOUT CONTRAST TECHNIQUE: Multiplanar, multiecho pulse sequences of the brain and surrounding structures were obtained without intravenous contrast. Angiographic images of the head were obtained using MRA technique without contrast. COMPARISON:  Prior head CT from earlier the same day. FINDINGS: MRI HEAD FINDINGS Brain: Moderately advanced age-related cerebral atrophy. Patchy and confluent T2/FLAIR hyperintensity within the periventricular deep white matter both cerebral hemispheres most consistent with chronic small vessel ischemic disease, moderate in nature. Patchy involvement of the pons. Multiple scattered superimposed remote lacunar  infarcts present about the bilateral basal ganglia and hemispheric cerebral white matter. Remote lacunar infarct at the left thalamus and pons. No abnormal foci of restricted diffusion to suggest acute or subacute ischemia. Gray-white matter differentiation maintained. No encephalomalacia to suggest remote cortical infarction. No acute intracranial hemorrhage. Few scattered chronic micro hemorrhages noted within the hemispheric cerebral white matter, nonspecific, but likely small vessel related. No mass lesion, midline shift or mass effect. No hydrocephalus or extra-axial fluid collection. Pituitary gland suprasellar region within normal limits. Midline  structures intact. Vascular: Major intracranial vascular flow voids are maintained. Skull and upper cervical spine: Degenerative thickening noted at the tectorial membrane without significant stenosis. Craniocervical junction otherwise unremarkable. Bone marrow signal intensity within normal limits. No scalp soft tissue abnormality. Sinuses/Orbits: Patient status post bilateral ocular lens replacement. Axial myopia noted. Globes and orbital soft tissues demonstrate no acute finding. Paranasal sinuses are largely clear. No significant mastoid effusion. Inner ear structures grossly normal. Other: None. MRA HEAD FINDINGS ANTERIOR CIRCULATION: Examination mildly degraded by motion. Visualized distal cervical segments of the internal carotid arteries are patent with symmetric antegrade flow. Petrous, cavernous, and supraclinoid segments widely patent without stenosis. 3 mm focal outpouching extending medially and inferiorly from the cavernous left ICA consistent with a small aneurysm (series 1, image 104). A1 segments widely patent. Normal anterior communicating artery complex. Anterior cerebral arteries widely patent to their distal aspects without stenosis. No M1 stenosis or occlusion. Normal MCA bifurcations. Distal MCA branches well perfused and symmetric. POSTERIOR CIRCULATION: Both V4 segments widely patent to the vertebrobasilar junction without stenosis. Left vertebral dominant. Both PICA origins patent and normal. Basilar widely patent to its distal aspect without stenosis. Superior cerebellar arteries patent bilaterally. Both PCAs primarily supplied via the basilar. Left PCA well perfused to its distal aspect. Suspected severe distal right P3 stenosis (series 1046, image 7). IMPRESSION: MRI HEAD IMPRESSION: 1. No acute intracranial abnormality. 2. Moderately advanced age-related cerebral atrophy and moderate chronic small vessel ischemic disease, with multiple remote lacunar infarcts involving the bilateral  basal ganglia, hemispheric cerebral white matter, and pons. MRA HEAD IMPRESSION: 1. Negative intracranial MRA for large vessel occlusion. No correctable stenosis identified. 2. 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 3. 3 mm cavernous left ICA aneurysm. Electronically Signed   By: Jeannine Boga M.D.   On: 05/06/2020 21:04   MR BRAIN WO CONTRAST  Result Date: 05/06/2020 CLINICAL DATA:  Initial evaluation for acute TIA, aphasia. EXAM: MRI HEAD WITHOUT CONTRAST MRA HEAD WITHOUT CONTRAST TECHNIQUE: Multiplanar, multiecho pulse sequences of the brain and surrounding structures were obtained without intravenous contrast. Angiographic images of the head were obtained using MRA technique without contrast. COMPARISON:  Prior head CT from earlier the same day. FINDINGS: MRI HEAD FINDINGS Brain: Moderately advanced age-related cerebral atrophy. Patchy and confluent T2/FLAIR hyperintensity within the periventricular deep white matter both cerebral hemispheres most consistent with chronic small vessel ischemic disease, moderate in nature. Patchy involvement of the pons. Multiple scattered superimposed remote lacunar infarcts present about the bilateral basal ganglia and hemispheric cerebral white matter. Remote lacunar infarct at the left thalamus and pons. No abnormal foci of restricted diffusion to suggest acute or subacute ischemia. Gray-white matter differentiation maintained. No encephalomalacia to suggest remote cortical infarction. No acute intracranial hemorrhage. Few scattered chronic micro hemorrhages noted within the hemispheric cerebral white matter, nonspecific, but likely small vessel related. No mass lesion, midline shift or mass effect. No hydrocephalus or extra-axial fluid collection. Pituitary gland suprasellar region within normal limits. Midline structures  intact. Vascular: Major intracranial vascular flow voids are maintained. Skull and upper cervical spine: Degenerative thickening noted at the  tectorial membrane without significant stenosis. Craniocervical junction otherwise unremarkable. Bone marrow signal intensity within normal limits. No scalp soft tissue abnormality. Sinuses/Orbits: Patient status post bilateral ocular lens replacement. Axial myopia noted. Globes and orbital soft tissues demonstrate no acute finding. Paranasal sinuses are largely clear. No significant mastoid effusion. Inner ear structures grossly normal. Other: None. MRA HEAD FINDINGS ANTERIOR CIRCULATION: Examination mildly degraded by motion. Visualized distal cervical segments of the internal carotid arteries are patent with symmetric antegrade flow. Petrous, cavernous, and supraclinoid segments widely patent without stenosis. 3 mm focal outpouching extending medially and inferiorly from the cavernous left ICA consistent with a small aneurysm (series 1, image 104). A1 segments widely patent. Normal anterior communicating artery complex. Anterior cerebral arteries widely patent to their distal aspects without stenosis. No M1 stenosis or occlusion. Normal MCA bifurcations. Distal MCA branches well perfused and symmetric. POSTERIOR CIRCULATION: Both V4 segments widely patent to the vertebrobasilar junction without stenosis. Left vertebral dominant. Both PICA origins patent and normal. Basilar widely patent to its distal aspect without stenosis. Superior cerebellar arteries patent bilaterally. Both PCAs primarily supplied via the basilar. Left PCA well perfused to its distal aspect. Suspected severe distal right P3 stenosis (series 1046, image 7). IMPRESSION: MRI HEAD IMPRESSION: 1. No acute intracranial abnormality. 2. Moderately advanced age-related cerebral atrophy and moderate chronic small vessel ischemic disease, with multiple remote lacunar infarcts involving the bilateral basal ganglia, hemispheric cerebral white matter, and pons. MRA HEAD IMPRESSION: 1. Negative intracranial MRA for large vessel occlusion. No correctable  stenosis identified. 2. 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 3. 3 mm cavernous left ICA aneurysm. Electronically Signed   By: Jeannine Boga M.D.   On: 05/06/2020 21:04   US Carotid Bilateral (at [facility] and [facility] only)  Result Date: 05/06/2020 CLINICAL DATA:  Initial evaluation for acute stroke/TIA, aphasia. EXAM: BILATERAL CAROTID DUPLEX ULTRASOUND TECHNIQUE: Pearline Cables scale imaging, color Doppler and duplex ultrasound were performed of bilateral carotid and vertebral arteries in the neck. COMPARISON:  Prior MRI from earlier the same day. FINDINGS: Criteria: Quantification of carotid stenosis is based on velocity parameters that correlate the residual internal carotid diameter with NASCET-based stenosis levels, using the diameter of the distal internal carotid lumen as the denominator for stenosis measurement. The following velocity measurements were obtained: RIGHT ICA: 131/14 cm/sec CCA: 41/93 cm/sec SYSTOLIC ICA/CCA RATIO:  2.0 ECA: 143 cm/sec LEFT ICA: 74/22 cm/sec CCA: 79/02 cm/sec SYSTOLIC ICA/CCA RATIO:  1.2 ECA: 71 cm/sec RIGHT CAROTID ARTERY: Minimal intimal thickening seen about the mid-distal right common carotid artery without significant stenosis. Bulky echogenic calcified plaque seen about the carotid bulb. Qualitatively, this appears to be nearly occlusive in nature (image 19). While the measured elevated peak systolic velocity of 409 cm/sec corresponds with an estimated 50-69% stenosis, this may be underestimated. Distally, visualized portions of the right ICA are otherwise widely patent. RIGHT VERTEBRAL ARTERY:  Antegrade. LEFT CAROTID ARTERY: Mild scattered echogenic plaque with intimal thickening seen within the mid-distal left CCA without significant stenosis. Echogenic calcified plaque at the left bifurcation extending into the proximal left ICA, but with no elevation in peak systolic velocity to suggest significant stenosis. Visualized left ICA otherwise patent distally. LEFT VERTEBRAL  ARTERY:  Antegrade. IMPRESSION: 1. Bulky echogenic plaque at the right carotid bulb, with qualitative appearance of severe high-grade/near occlusive stenosis. Further evaluation with dedicated CTA recommended for further evaluation. 2. Mild atheromatous  plaque about the left carotid artery system without sonographic evidence for hemodynamically significant stenosis. 3. Antegrade flow within both vertebral arteries. Electronically Signed   By: Jeannine Boga M.D.   On: 05/06/2020 23:16   CT HEAD CODE STROKE WO CONTRAST`  Result Date: 05/06/2020 CLINICAL DATA:  Code stroke. Intermittent dysphagia with frontal headache EXAM: CT HEAD WITHOUT CONTRAST TECHNIQUE: Contiguous axial images were obtained from the base of the skull through the vertex without intravenous contrast. COMPARISON:  None. FINDINGS: Brain: There is no acute intracranial hemorrhage, mass effect or edema. No acute appearing loss of gray-white differentiation. There are chronic infarcts of the left thalamocapsular region and right cerebellum. Additional patchy and confluent areas of hypoattenuation in the supratentorial white matter are nonspecific but probably reflect moderate chronic microvascular ischemic changes. Prominence of the ventricles and sulci reflects generalized parenchymal volume loss. There is no extra-axial collection. Vascular: No hyperdense vessel. There is intracranial atherosclerotic calcification at the skull base. Skull: Unremarkable. Sinuses/Orbits: No acute abnormality. Other: Mastoid air cells are clear. ASPECTS ([place] Stroke Program Early CT Score) - Ganglionic level infarction (caudate, lentiform nuclei, internal capsule, insula, M1-M3 cortex): 7 - Supraganglionic infarction (M4-M6 cortex): 3 Total score (0-10 with 10 being normal): 10 IMPRESSION: There is no acute intracranial hemorrhage or evidence of acute infarction. ASPECT score is 10. Moderate chronic microvascular ischemic changes. Chronic left  thalamocapsular and right cerebellar infarcts. These results were called by telephone at the time of interpretation on 05/06/2020 at 4:36 pm to provider Lavonia Drafts , who verbally acknowledged these results. Electronically Signed   By: Macy Mis M.D.   On: 05/06/2020 16:42    Microbiology: No results found for this or any previous visit (from the past 240 hour(s)).   Labs: CBC: Recent Labs  Lab 05/06/20 1655  WBC 6.6  NEUTROABS 4.6  HGB 11.8*  HCT 35.4*  MCV 91.9  PLT [id number]   Basic Metabolic Panel: Recent Labs  Lab 05/06/20 1655 05/07/20 0812  NA 130* 135  K 4.1 4.0  CL 96* 102  CO2 22 24  GLUCOSE 139* 99  BUN 11 9  CREATININE 0.92 0.71  CALCIUM 9.2 9.0  MG  --  2.0  PHOS  --  3.9   Liver Function Tests: Recent Labs  Lab 05/06/20 1655  AST 26  ALT 22  ALKPHOS 49  BILITOT 0.7  PROT 7.2  ALBUMIN 4.1   No results for input(s): LIPASE, AMYLASE in the last 168 hours. No results for input(s): AMMONIA in the last 168 hours. Cardiac Enzymes: No results for input(s): CKTOTAL, CKMB, CKMBINDEX, TROPONINI in the last 168 hours. BNP (last 3 results) No results for input(s): BNP in the last 8760 hours. CBG: Recent Labs  Lab 05/06/20 1658  GLUCAP 128*    Time spent: 35 minutes  Signed:  Val Riles  [facility]  05/07/2020 3:34 PM

## 2020-05-07 NOTE — Progress Notes (Signed)
*  PRELIMINARY RESULTS* Echocardiogram 2D Echocardiogram has been performed.  Cory [name] C Cory [name] 05/07/2020, 1:17 PM

## 2020-05-07 NOTE — ED Notes (Signed)
Hospitalist at bedside at this time 

## 2020-05-08 LAB — SARS CORONAVIRUS 2 (TAT 6-24 HRS): SARS Coronavirus 2: NEGATIVE

## 2020-05-11 ENCOUNTER — Ambulatory Visit (INDEPENDENT_AMBULATORY_CARE_PROVIDER_SITE_OTHER): Payer: Medicare Other | Admitting: Family Medicine

## 2020-05-11 ENCOUNTER — Other Ambulatory Visit: Payer: Self-pay

## 2020-05-11 ENCOUNTER — Encounter: Payer: Self-pay | Admitting: Family Medicine

## 2020-05-11 VITALS — BP 132/72 | HR 63 | Temp 97.7°F | Wt 166.5 lb

## 2020-05-11 DIAGNOSIS — I729 Aneurysm of unspecified site: Secondary | ICD-10-CM | POA: Insufficient documentation

## 2020-05-11 DIAGNOSIS — N138 Other obstructive and reflux uropathy: Secondary | ICD-10-CM

## 2020-05-11 DIAGNOSIS — I10 Essential (primary) hypertension: Secondary | ICD-10-CM

## 2020-05-11 DIAGNOSIS — I69351 Hemiplegia and hemiparesis following cerebral infarction affecting right dominant side: Secondary | ICD-10-CM | POA: Diagnosis not present

## 2020-05-11 DIAGNOSIS — N401 Enlarged prostate with lower urinary tract symptoms: Secondary | ICD-10-CM

## 2020-05-11 DIAGNOSIS — Z8673 Personal history of transient ischemic attack (TIA), and cerebral infarction without residual deficits: Secondary | ICD-10-CM

## 2020-05-11 NOTE — Progress Notes (Signed)
Subjective:     Cory [name] is a 80 y.o. male presenting for ER follow up and Referral (Neurologist - [facility] )     HPI  #aphasia - hx of TIA and CVA  - symptoms seem to be lasting longer  - would like to see neurology -   #nocturia - saw urology 3 years ago - failed tamsulosin  - success with mirabegron     1/8-12/2020: Admission:  TIA vs CVA - aneurysm - recommend NSG, no stroke findings. Cont dual platelet and statin. Carotid duplex with 60% - vascular.  - bp elevated due to permissive htn - restart home meds - outpt pt  Review of Systems   Social History   Tobacco Use  Smoking Status Former Smoker  . Types: Pipe  Smokeless Tobacco Never Used        Objective:    BP Readings from Last 3 Encounters:  05/11/20 132/72  05/07/20 140/77  04/13/20 120/78   Wt Readings from Last 3 Encounters:  05/11/20 166 lb 8 oz (75.5 kg)  05/06/20 198 lb 6.6 oz (90 kg)  04/13/20 167 lb (75.8 kg)    BP 132/72   Pulse 63   Temp 97.7 F (36.5 C) (Temporal)   Wt 166 lb 8 oz (75.5 kg)   SpO2 99%   BMI 23.89 kg/m    Physical Exam Constitutional:      Appearance: Normal appearance. He is not ill-appearing or diaphoretic.  HENT:     Right Ear: External ear normal.     Left Ear: External ear normal.     Nose: Nose normal.  Eyes:     General: No scleral icterus.    Extraocular Movements: Extraocular movements intact.     Conjunctiva/sclera: Conjunctivae normal.  Cardiovascular:     Rate and Rhythm: Normal rate and regular rhythm.     Heart sounds: Murmur heard.    Pulmonary:     Effort: Pulmonary effort is normal. No respiratory distress.     Breath sounds: Normal breath sounds. No wheezing.  Musculoskeletal:     Cervical back: Neck supple.  Skin:    General: Skin is warm and dry.  Neurological:     Mental Status: He is alert. Mental status is at baseline.  Psychiatric:        Mood and Affect: Mood normal.           Assessment &  Plan:   Problem List Items Addressed This Visit      Cardiovascular and Mediastinum   Hypertension    BP at goal. 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 Referral to neurology given new TIA      Relevant Orders   Ambulatory referral to Neurology     Genitourinary   BPH with urinary obstruction    Pt reports long hx of overactive bladder with nocturia. Saw urology and failed several medications including a prostate procedure w/o improvement. Success with mirabegron but this is $200/month. Discussed kegels at home. Will obtain outside records and consider urology referral vs alternative medications once complete list of  failed trials reviewed vs pelvic floor PT.        Other Visit Diagnoses    Hx of TIA (transient ischemic attack) and stroke       Relevant Orders   Ambulatory referral to Neurology       Return in about 2 months (around 07/09/2020) for urinary symptoms.  [name], MD  This visit occurred during the SARS-CoV-2 public health emergency.  Safety protocols were in place, including screening questions prior to the visit, additional usage of staff PPE, and extensive cleaning of exam room while observing appropriate contact time as indicated for disinfecting solutions.

## 2020-05-11 NOTE — Assessment & Plan Note (Signed)
Cont lipitor 40 mg, asa 81 mg, plavix 75 mg. Referral to neurology given new TIA

## 2020-05-11 NOTE — Patient Instructions (Addendum)
Urinary Incontinence in Women  Risk Factors - Cannot change: age, pregnancy history, history of hysterectomy - Possible factors you can change: overall health, diuretic use, high impact exercise, weight   General Treatment  1) Appropriate Fluid intake - drink 50-70 oz daily, but spread out how the fluid is consumed 2) Constipation Management - make sure you get fiber in your diet and if constipated a stool softener 3) Weight loss (if overweight) 4) Pelvic Floor strengthening --  ---- Kegel Exercises (practice by holding urine during urination -- feel the pelvic floor muscle contraction). Perform 3 sets of 8-10 contractions working your way to holding each contraction for 10 seconds. Do for 15-20 weeks -----> If you have trouble doing this, try them laying down. Lay down on your back with your feet flat on the ground and knees bent.    Urge Incontinence - Cause: detrusor overactivity (over-active bladder) - Symptoms: lose urine on the way to the bathroom, more frequent trips, urinating overnight - Medications/Substances that worsen: alcohol, caffeine, Diuretics - Treatment >>> Kegel Exercises and Bladder training >>> Bladder Training: Remain stationary when urgency occurs, concentrate on decreasing the urge (Do 3 quick Kegels and 2 deep belly breaths), once controlled walk slowly to the bathroom. Eventually try to wait 30-60 minutes when the urge occurs >>> In patients with cognitive impairment: prompted voiding (remind regular use of the bathroom), scheduled voiding (take them to the bathroom every 2-3 hours) >>> Medications: oxybutynin, darifenacin, mirabegron  We will get your records for urology and check back for what you've tried  Call if decide you want to see Urology or pelvic floor physical therapy  Referral to Neurosurgery for the aneurysm Referral to Neurology for the TIA   #Referral I have placed a referral to a specialist for you. You should receive a phone call from  the specialty office. Make sure your voicemail is not full and that if you are able to answer your phone to unknown or new numbers.   It may take up to 2 weeks to hear about the referral. If you do not hear anything in 2 weeks, please call our office and ask to speak with the referral coordinator.

## 2020-05-11 NOTE — Assessment & Plan Note (Signed)
Referral to neurosurgery placed for follow-up

## 2020-05-11 NOTE — Assessment & Plan Note (Signed)
Pt reports long hx of overactive bladder with nocturia. Saw urology and failed several medications including a prostate procedure w/o improvement. Success with mirabegron but this is $200/month. Discussed kegels at home. Will obtain outside records and consider urology referral vs alternative medications once complete list of failed trials reviewed vs pelvic floor PT.

## 2020-05-11 NOTE — Assessment & Plan Note (Signed)
BP at goal. Cont lisinopril 10 mg, clonidine 0.1 mg BID, amlodipine 5 mg

## 2020-05-17 ENCOUNTER — Telehealth: Payer: Self-pay | Admitting: Family Medicine

## 2020-05-17 DIAGNOSIS — N401 Enlarged prostate with lower urinary tract symptoms: Secondary | ICD-10-CM

## 2020-05-17 DIAGNOSIS — N138 Other obstructive and reflux uropathy: Secondary | ICD-10-CM

## 2020-05-17 NOTE — Telephone Encounter (Signed)
Reviewed urology records  Pt with overactive bladder and BPH Overactive bladder. Last saw Dr. Rex Kras 2018 and was suggesting InterStim placement at last visit. Failed Toviaz (congestion), Vesicare (variable), myrbetriq (worsening?), doxazosin photoselective vaporization of the prostate  Discussed with patient and give medication cost and history of difficult to treat symptoms advised urology re-evaluation.

## 2020-05-30 ENCOUNTER — Other Ambulatory Visit: Payer: Self-pay

## 2020-05-30 ENCOUNTER — Ambulatory Visit (INDEPENDENT_AMBULATORY_CARE_PROVIDER_SITE_OTHER): Payer: Medicare Other | Admitting: Urology

## 2020-05-30 VITALS — BP 174/89 | HR 75 | Ht 65.0 in | Wt 160.0 lb

## 2020-05-30 DIAGNOSIS — N401 Enlarged prostate with lower urinary tract symptoms: Secondary | ICD-10-CM | POA: Diagnosis not present

## 2020-05-30 DIAGNOSIS — N138 Other obstructive and reflux uropathy: Secondary | ICD-10-CM

## 2020-05-30 DIAGNOSIS — N400 Enlarged prostate without lower urinary tract symptoms: Secondary | ICD-10-CM

## 2020-05-30 DIAGNOSIS — R350 Frequency of micturition: Secondary | ICD-10-CM

## 2020-05-30 LAB — BLADDER SCAN AMB NON-IMAGING: Scan Result: 1

## 2020-05-30 NOTE — Progress Notes (Signed)
05/30/2020 12:16 PM   Cory [name] [birth year] [id number]  Referring provider: [name], MD [place],  [place] [address]  Chief Complaint  Patient presents with  . Benign Prostatic Hypertrophy    HPI: 80 year old male referred for further evaluation of longstanding urinary symptoms.  He has a personal history of stroke with right hemiparesis as a sequela.  He is a long history of OAB with nocturia.  He has been seen and evaluated by urologist and tried and failed multiple medications.  He did have some improvement with Myrbetriq but this caused him to $100 per month which was not affordable.  He also had an outlet procedure which was unsuccessful 5+ years ago which he reports is a [employer].  He is not sure which other medications he is tried.  He reports that his primary care requested records from Dr. Rex Kras in [place] which he was previously routinely followed but these are unavailable today.  Urinary symptoms as below.  No dysuria or gross hematuria.  No recent infections.   IPSS    Row Name 05/30/20 1500         International Prostate Symptom Score   How often have you had the sensation of not emptying your bladder? More than half the time     How often have you had to urinate less than every two hours? More than half the time     How often have you found you stopped and started again several times when you urinated? Almost always     How often have you found it difficult to postpone urination? More than half the time     How often have you had a weak urinary stream? About half the time     How often have you had to strain to start urination? Less than 1 in 5 times     How many times did you typically get up at night to urinate? 5 Times     Total IPSS Score 26           Quality of Life due to urinary symptoms   If you were to spend the rest of your life with your urinary condition just the way it is now how would you feel about that?  Mostly Disatisfied            Score:  1-7 Mild 8-19 Moderate 20-35 Severe    PMH: Past Medical History:  Diagnosis Date  . CAD (coronary artery disease)   . GERD (gastroesophageal reflux disease)   . Heart murmur   . Hyperlipidemia   . Hypertension   . Stroke ([place])   . Vasovagal syncope     Surgical History: Past Surgical History:  Procedure Laterality Date  . BACK SURGERY    . CHOLECYSTECTOMY    . CORONARY ARTERY BYPASS GRAFT  2000  . NECK SURGERY      Home Medications:  Allergies as of 05/30/2020      Reactions   Fesoterodine Other (See Comments)   Dry mouth and constipation   Isosorbide Other (See Comments)   Tramadol Other (See Comments)   Grass Pollen(k-o-r-t-swt Vern) Other (See Comments)   Hydrocodone Anxiety   Meperidine Other (See Comments), Rash   Other reaction(s): Other (See Comments) HALLUCINATIONS   Penicillins Rash      Medication List       Accurate as of May 30, 2020 11:59 PM. If you have any questions, ask your nurse or  doctor.        acetaminophen 500 MG tablet Commonly known as: TYLENOL Take 1,000 mg by mouth at bedtime.   amLODipine 5 MG tablet Commonly known as: NORVASC Take 1 tablet (5 mg total) by mouth daily.   aspirin EC 81 MG tablet Take 1 tablet (81 mg total) by mouth daily. Swallow whole.   atorvastatin 40 MG tablet Commonly known as: LIPITOR Take 1 tablet (40 mg total) by mouth daily.   cloNIDine 0.1 MG tablet Commonly known as: CATAPRES Take 1 tablet (0.1 mg total) by mouth 2 (two) times daily.   clopidogrel 75 MG tablet Commonly known as: PLAVIX Take 1 tablet (75 mg total) by mouth daily.   cycloSPORINE 0.05 % ophthalmic emulsion Commonly known as: RESTASIS 1 drop 2 (two) times a day.   diclofenac Sodium 1 % Gel Commonly known as: VOLTAREN Apply 2 g topically as needed.   famotidine 20 MG tablet Commonly known as: PEPCID Take 20 mg by mouth at bedtime.   fluticasone 50 MCG/ACT nasal  spray Commonly known as: FLONASE 1 spray by Each Nare route nightly.   lisinopril 10 MG tablet Commonly known as: ZESTRIL Take 1 tablet (10 mg total) by mouth daily.   melatonin 1 MG Tabs tablet Take 3 mg by mouth at bedtime.   metFORMIN 500 MG tablet Commonly known as: GLUCOPHAGE Take 1 tablet (500 mg total) by mouth daily with breakfast.   montelukast 10 MG tablet Commonly known as: SINGULAIR Take 1 tablet (10 mg total) by mouth at bedtime.   nitroGLYCERIN 0.4 MG SL tablet Commonly known as: NITROSTAT Place 0.4 mg under the tongue every 5 (five) minutes as needed.   pantoprazole 40 MG tablet Commonly known as: PROTONIX Take 1 tablet (40 mg total) by mouth 2 (two) times daily.   PX Complete Senior Multivits Tabs Take by mouth daily.       Allergies:  Allergies  Allergen Reactions  . Fesoterodine Other (See Comments)    Dry mouth and constipation  . Isosorbide Other (See Comments)  . Tramadol Other (See Comments)  . Grass Pollen(K-O-R-T-Swt Vern) Other (See Comments)  . Hydrocodone Anxiety  . Meperidine Other (See Comments) and Rash    Other reaction(s): Other (See Comments) HALLUCINATIONS   . Penicillins Rash    Family History: Family History  Problem Relation Age of Onset  . Heart disease Mother   . Heart attack Father   . Heart disease Brother     Social History:  reports that he has quit smoking. His smoking use included pipe. He has never used smokeless tobacco. He reports current drug use. He reports that he does not drink alcohol.   Physical Exam: BP (!) 174/89   Pulse 75   Ht 5\' 5"  (1.651 m)   Wt 160 lb (72.6 kg)   BMI 26.63 kg/m   Constitutional:  Alert and oriented, No acute distress. HEENT: [place] AT, moist mucus membranes.  Trachea midline, no masses. Cardiovascular: No clubbing, cyanosis, or edema. Respiratory: Normal respiratory effort, no increased work of breathing. Skin: No rashes, bruises or suspicious lesions. Neurologic: Grossly  intact, no focal deficits, moving all 4 extremities. Psychiatric: Normal mood and affect.  Laboratory Data: Lab Results  Component Value Date   WBC 6.6 05/06/2020   HGB 11.8 (L) 05/06/2020   HCT 35.4 (L) 05/06/2020   MCV 91.9 05/06/2020   PLT 226 05/06/2020    Lab Results  Component Value Date   CREATININE 0.71 05/07/2020  Lab Results  Component Value Date   HGBA1C 6.0 (H) 05/07/2020    Urinalysis Results for orders placed or performed in visit on 05/30/20  Microscopic Examination   Urine  Result Value Ref Range   WBC, UA 0-5 0 - 5 /hpf   RBC 0-2 0 - 2 /hpf   Epithelial Cells (non renal) 0-10 0 - 10 /hpf   Bacteria, UA None seen None seen/Few  Urinalysis, Complete  Result Value Ref Range   Specific Gravity, UA 1.020 1.005 - 1.030   pH, UA 6.5 5.0 - 7.5   Color, UA Yellow Yellow   Appearance Ur Clear Clear   Leukocytes,UA Negative Negative   Protein,UA Negative Negative/Trace   Glucose, UA Negative Negative   Ketones, UA Trace (A) Negative   RBC, UA Negative Negative   Bilirubin, UA Negative Negative   Urobilinogen, Ur 0.2 0.2 - 1.0 mg/dL   Nitrite, UA Negative Negative   Microscopic Examination See below:   BLADDER SCAN AMB NON-IMAGING  Result Value Ref Range   Scan Result 1 ml     Assessment & Plan:    1. Benign prostatic hyperplasia with urinary obstruction Severe poorly controlled both obstructive and irritative urinary symptoms, longstanding  Not currently on any BPH medications, unclear what he is tried and failed in the past  Urinalysis today is unremarkable and is emptying adequately for the time being  We will request records from Dr. Eddie Dibbles office reviewed to ensure that we are not repeating the same evaluation and treatment options  We discussed that he may need another outlet procedure to the road as a procedure such as relentless or not always durable, his prostate may be playing a role.  We discussed depending on findings, may consider  cystoscopy forward. - Urinalysis, Complete - BLADDER SCAN AMB NON-IMAGING  2. Urinary frequency In the interim awaiting records, he has done well with Myrbetriq in the past as he was given samples of Myrbetriq 25 mg x 1 month until his next follow-up.   Follow-up in 1 month for records review  Hollice Espy, MD  [facility] [address] [place], [place] [address] [phone]

## 2020-05-31 LAB — MICROSCOPIC EXAMINATION: Bacteria, UA: NONE SEEN

## 2020-05-31 LAB — URINALYSIS, COMPLETE
Bilirubin, UA: NEGATIVE
Glucose, UA: NEGATIVE
Leukocytes,UA: NEGATIVE
Nitrite, UA: NEGATIVE
Protein,UA: NEGATIVE
RBC, UA: NEGATIVE
Specific Gravity, UA: 1.02 (ref 1.005–1.030)
Urobilinogen, Ur: 0.2 mg/dL (ref 0.2–1.0)
pH, UA: 6.5 (ref 5.0–7.5)

## 2020-06-08 ENCOUNTER — Other Ambulatory Visit: Payer: Self-pay | Admitting: Neurosurgery

## 2020-06-08 DIAGNOSIS — I671 Cerebral aneurysm, nonruptured: Secondary | ICD-10-CM

## 2020-06-27 ENCOUNTER — Other Ambulatory Visit: Payer: Self-pay

## 2020-06-27 ENCOUNTER — Ambulatory Visit (INDEPENDENT_AMBULATORY_CARE_PROVIDER_SITE_OTHER): Payer: Medicare Other | Admitting: Urology

## 2020-06-27 VITALS — BP 157/87 | HR 76 | Ht 66.0 in | Wt 156.0 lb

## 2020-06-27 DIAGNOSIS — R350 Frequency of micturition: Secondary | ICD-10-CM

## 2020-06-27 DIAGNOSIS — N138 Other obstructive and reflux uropathy: Secondary | ICD-10-CM | POA: Diagnosis not present

## 2020-06-27 DIAGNOSIS — N401 Enlarged prostate with lower urinary tract symptoms: Secondary | ICD-10-CM | POA: Diagnosis not present

## 2020-06-27 MED ORDER — TAMSULOSIN HCL 0.4 MG PO CAPS: 0.4000 mg | ORAL_CAPSULE | Freq: Every day | ORAL | 0 refills | Status: DC

## 2020-06-27 MED ORDER — GEMTESA 75 MG PO TABS: 75.0000 mg | ORAL_TABLET | Freq: Every day | ORAL | 0 refills | Status: DC

## 2020-06-27 NOTE — Progress Notes (Signed)
06/27/2020 4:59 PM   Marjean Donna III March 27, 1941 [id number]  Referring provider: [name], MD [place],  [place] [address]  Chief Complaint  Patient presents with  . Benign Prostatic Hypertrophy    HPI: 80 year old male with severe mixed urinary symptoms poorly controlled he returns today for 1 month follow-up.  Since last visit, he tried Myrbetriq 25 mg with some mild improvement in his urinary frequency.  He continues to have obstructive symptoms as well as ongoing urgency frequency.  IPSS as below.  Records from Dr. Eddie Dibbles office were received.  Appears that the patient had preoperative urodynamics indicating high pressure voiding with changes consistent with bladder outlet obstruction.  He underwent photo selective vaporization of the prostate in 2017.  Postoperatively, it appears that he had persistent urgency frequency.  He tried multiple medications including multiple anticholinergics as well as Myrbetriq without significant improvement.  InterStim was discussed but never pursued.   IPSS    Row Name 06/27/20 1400         International Prostate Symptom Score   How often have you had the sensation of not emptying your bladder? More than half the time     How often have you had to urinate less than every two hours? About half the time     How often have you found you stopped and started again several times when you urinated? Almost always     How often have you found it difficult to postpone urination? More than half the time     How often have you had a weak urinary stream? Less than half the time     How often have you had to strain to start urination? Not at All     How many times did you typically get up at night to urinate? 3 Times     Total IPSS Score 21           Quality of Life due to urinary symptoms   If you were to spend the rest of your life with your urinary condition just the way it is now how would you feel about that? Mostly  Disatisfied            Score:  1-7 Mild 8-19 Moderate 20-35 Severe   PMH: Past Medical History:  Diagnosis Date  . CAD (coronary artery disease)   . GERD (gastroesophageal reflux disease)   . Heart murmur   . Hyperlipidemia   . Hypertension   . Stroke ([place])   . Vasovagal syncope     Surgical History: Past Surgical History:  Procedure Laterality Date  . BACK SURGERY    . CHOLECYSTECTOMY    . CORONARY ARTERY BYPASS GRAFT  2000  . NECK SURGERY      Home Medications:  Allergies as of 06/27/2020      Reactions   Fesoterodine Other (See Comments)   Dry mouth and constipation   Isosorbide Other (See Comments)   Tramadol Other (See Comments)   Grass Pollen(k-o-r-t-swt Vern) Other (See Comments)   Hydrocodone Anxiety   Meperidine Other (See Comments), Rash   Other reaction(s): Other (See Comments) HALLUCINATIONS   Penicillins Rash      Medication List       Accurate as of June 27, 2020  4:59 PM. If you have any questions, ask your nurse or doctor.        acetaminophen 500 MG tablet Commonly known as: TYLENOL Take 1,000 mg by mouth at  bedtime.   amLODipine 2.5 MG tablet Commonly known as: NORVASC amlodipine 2.5 mg tablet What changed: Another medication with the same name was removed. Continue taking this medication, and follow the directions you see here. Changed by: Hollice Espy, MD   aspirin EC 81 MG tablet Take 1 tablet (81 mg total) by mouth daily. Swallow whole.   atorvastatin 40 MG tablet Commonly known as: LIPITOR Take 1 tablet (40 mg total) by mouth daily.   cloNIDine 0.1 MG tablet Commonly known as: CATAPRES Take 1 tablet (0.1 mg total) by mouth 2 (two) times daily.   clopidogrel 75 MG tablet Commonly known as: PLAVIX Take 1 tablet (75 mg total) by mouth daily.   cycloSPORINE 0.05 % ophthalmic emulsion Commonly known as: RESTASIS 1 drop 2 (two) times a day.   diclofenac Sodium 1 % Gel Commonly known as: VOLTAREN Apply 2 g topically  as needed.   famotidine 20 MG tablet Commonly known as: PEPCID Take 20 mg by mouth at bedtime.   fluticasone 50 MCG/ACT nasal spray Commonly known as: FLONASE 1 spray by Each Nare route nightly.   Gemtesa 75 MG Tabs Generic drug: Vibegron Take 75 mg by mouth daily. Started by: Hollice Espy, MD   lisinopril 10 MG tablet Commonly known as: ZESTRIL Take 1 tablet (10 mg total) by mouth daily.   melatonin 3 MG Tabs tablet Take by mouth. What changed: Another medication with the same name was removed. Continue taking this medication, and follow the directions you see here. Changed by: Hollice Espy, MD   metFORMIN 500 MG tablet Commonly known as: GLUCOPHAGE Take 1 tablet (500 mg total) by mouth daily with breakfast.   montelukast 10 MG tablet Commonly known as: SINGULAIR Take 1 tablet (10 mg total) by mouth at bedtime.   nitroGLYCERIN 0.4 MG SL tablet Commonly known as: NITROSTAT Place 0.4 mg under the tongue every 5 (five) minutes as needed.   pantoprazole 40 MG tablet Commonly known as: PROTONIX Take 1 tablet (40 mg total) by mouth 2 (two) times daily.   PX Complete Senior Multivits Tabs Take by mouth daily.   tamsulosin 0.4 MG Caps capsule Commonly known as: FLOMAX Take 1 capsule (0.4 mg total) by mouth daily. Started by: Hollice Espy, MD       Allergies:  Allergies  Allergen Reactions  . Fesoterodine Other (See Comments)    Dry mouth and constipation  . Isosorbide Other (See Comments)  . Tramadol Other (See Comments)  . Grass Pollen(K-O-R-T-Swt Vern) Other (See Comments)  . Hydrocodone Anxiety  . Meperidine Other (See Comments) and Rash    Other reaction(s): Other (See Comments) HALLUCINATIONS   . Penicillins Rash    Family History: Family History  Problem Relation Age of Onset  . Heart disease Mother   . Heart attack Father   . Heart disease Brother     Social History:  reports that he has quit smoking. His smoking use included pipe. He  has never used smokeless tobacco. He reports current drug use. He reports that he does not drink alcohol.   Physical Exam: BP (!) 157/87   Pulse 76   Ht 5\' 6"  (1.676 m)   Wt 156 lb (70.8 kg)   BMI 25.18 kg/m   Constitutional:  Alert and oriented, No acute distress. HEENT: [place] AT, moist mucus membranes.  Trachea midline, no masses. Cardiovascular: No clubbing, cyanosis, or edema. Respiratory: Normal respiratory effort, no increased work of breathing. Skin: No rashes, bruises or suspicious lesions. Neurologic:  Grossly intact, no focal deficits, moving all 4 extremities. Psychiatric: Normal mood and affect.  Laboratory Data: Lab Results  Component Value Date   WBC 6.6 05/06/2020   HGB 11.8 (L) 05/06/2020   HCT 35.4 (L) 05/06/2020   MCV 91.9 05/06/2020   PLT 226 05/06/2020    Lab Results  Component Value Date   CREATININE 0.71 05/07/2020    Assessment & Plan:    1. Benign prostatic hyperplasia with urinary obstruction Ongoing poorly controlled obstructive urinary symptoms  In the interim, we will plan to put him back on Flomax.  He recalls being on this before his greenlight laser in 2017.  We discussed that it is highly possible that he has ongoing urinary obstruction due to prostatic regrowth.  I like to evaluate this further with cystoscopy/TRUS.  He is agreeable this plan.  We discussed possible side effects of Flomax in the interim.  2. Urinary frequency Some minimal improvement with Myrbetriq 25 mg with this is been cost prohibitive in the past  We will try an alternative beta 3 agonist in the form of Bernarda Caffey which we have samples to give him today as well.  Suspect frequency related to chronic obstruction as well as previous strokes.   Follow-up for cystoscopy/TRUS  Hollice Espy, MD  [facility] [address] [place], [place] [address] [phone]

## 2020-06-27 NOTE — Patient Instructions (Signed)
Cystoscopy Cystoscopy is a procedure that is used to help diagnose and sometimes treat conditions that affect the lower urinary tract. The lower urinary tract includes the bladder and the urethra. The urethra is the tube that drains urine from the bladder. Cystoscopy is done using a thin, tube-shaped instrument with a light and camera at the end (cystoscope). The cystoscope may be hard or flexible, depending on the goal of the procedure. The cystoscope is inserted through the urethra, into the bladder. Cystoscopy may be recommended if you have:  Urinary tract infections that keep coming back.  Blood in the urine (hematuria).  An inability to control when you urinate (urinary incontinence) or an overactive bladder.  Unusual cells found in a urine sample.  A blockage in the urethra, such as a urinary stone.  Painful urination.  An abnormality in the bladder found during an intravenous pyelogram (IVP) or CT scan. Cystoscopy may also be done to remove a sample of tissue to be examined under a microscope (biopsy). What are the risks? Generally, this is a safe procedure. However, problems may occur, including:  Infection.  Bleeding.  What happens during the procedure?  1. You will be given one or more of the following: ? A medicine to numb the area (local anesthetic). 2. The area around the opening of your urethra will be cleaned. 3. The cystoscope will be passed through your urethra into your bladder. 4. Germ-free (sterile) fluid will flow through the cystoscope to fill your bladder. The fluid will stretch your bladder so that your health care provider can clearly examine your bladder walls. 5. Your doctor will look at the urethra and bladder. 6. The cystoscope will be removed The procedure may vary among health care providers  What can I expect after the procedure? After the procedure, it is common to have: 1. Some soreness or pain in your abdomen and urethra. 2. Urinary symptoms.  These include: ? Mild pain or burning when you urinate. Pain should stop within a few minutes after you urinate. This may last for up to 1 week. ? A small amount of blood in your urine for several days. ? Feeling like you need to urinate but producing only a small amount of urine. Follow these instructions at home: General instructions  Return to your normal activities as told by your health care provider.   Do not drive for 24 hours if you were given a sedative during your procedure.  Watch for any blood in your urine. If the amount of blood in your urine increases, call your health care provider.  If a tissue sample was removed for testing (biopsy) during your procedure, it is up to you to get your test results. Ask your health care provider, or the department that is doing the test, when your results will be ready.  Drink enough fluid to keep your urine pale yellow.  Keep all follow-up visits as told by your health care provider. This is important. Contact a health care provider if you:  Have pain that gets worse or does not get better with medicine, especially pain when you urinate.  Have trouble urinating.  Have more blood in your urine. Get help right away if you:  Have blood clots in your urine.  Have abdominal pain.  Have a fever or chills.  Are unable to urinate. Summary  Cystoscopy is a procedure that is used to help diagnose and sometimes treat conditions that affect the lower urinary tract.  Cystoscopy is done using   a thin, tube-shaped instrument with a light and camera at the end.  After the procedure, it is common to have some soreness or pain in your abdomen and urethra.  Watch for any blood in your urine. If the amount of blood in your urine increases, call your health care provider.  If you were prescribed an antibiotic medicine, take it as told by your health care provider. Do not stop taking the antibiotic even if you start to feel better. This  information is not intended to replace advice given to you by your health care provider. Make sure you discuss any questions you have with your health care provider. Document Revised: 04/07/2018 Document Reviewed: 04/07/2018 Elsevier Patient Education  2020 Elsevier Transrectal Ultrasound A transrectal ultrasound is a procedure that uses sound waves to create images of the prostate gland and nearby tissues. For this procedure, an ultrasound probe is placed in the rectum. The probe sends sound waves through the wall of the rectum into the prostate gland, which is located in front of the rectum. The images show the size and shape of the prostate gland and nearby structures. You may need this test if you have:  Trouble urinating.  Trouble getting your partner pregnant (infertility).  An abnormal result from a prostate screening exam. Tell a health care provider about:  Any allergies you have.  All medicines you are taking, including vitamins, herbs, eye drops, creams, and over-the-counter medicines.  Any blood disorders you have.  Any medical conditions you have.  Any surgeries you have had. What are the risks? Generally, this is a safe procedure. However, problems may occur, including:  Discomfort during the procedure. This is rare.  Blood in your urine or sperm after the procedure. This may occur if a sample of tissue (biopsy) is taken during the procedure. What happens before the procedure?  Your health care provider may instruct you to use an enema 1-4 hours before the procedure. Follow instructions from your health care provider about how to do the enema.  Ask your health care provider about: ? Changing or stopping your regular medicines. This is especially important if you are taking diabetes medicines or blood thinners. ? Taking medicines such as aspirin and ibuprofen. These medicines can thin your blood. Do not take these medicines unless your health care provider tells you to  take them. ? Taking over-the-counter medicines, vitamins, herbs, and supplements. What happens during the procedure?  You will be asked to lie down on your left side on an exam table.  You will bend your knees toward your chest.  Gel will be put on a probe, and the probe will be gently inserted into your rectum. This may cause a feeling of fullness.  The probe will send signals to a computer that will create images. These will be displayed on a monitor that looks like a small television screen.  The technician will slightly rotate the probe throughout the procedure. While rotating the probe, he or she will view and capture images of the prostate gland and the surrounding structures from different angles.  Your health care provider may take a biopsy sample of prostate tissue during the procedure. The images captured from the ultrasound will help guide the needle used to remove a sample of tissue. The sample will be sent to a lab for testing.  The probe will be removed. The procedure may vary among health care providers and hospitals. What can I expect after the procedure?  It is up to  you to get the results of your procedure. Ask your health care provider, or the department that is doing the procedure, when your results will be ready.  Keep all follow-up visits as told by your health care provider. This is important. Summary  A transrectal ultrasound is a procedure that uses sound waves to create images of the prostate gland and nearby tissues.  The images show the size and shape of the prostate gland and nearby structures.  Before the procedure, ask your health care provider about changing or stopping your regular medicines. This is especially important if you are taking diabetes medicines or blood thinners. This information is not intended to replace advice given to you by your health care provider. Make sure you discuss any questions you have with your health care provider. Document  Revised: 06/09/2019 Document Reviewed: 06/09/2019 Elsevier Patient Education  2021 [employer].

## 2020-07-10 ENCOUNTER — Encounter: Payer: Self-pay | Admitting: Family Medicine

## 2020-07-10 ENCOUNTER — Other Ambulatory Visit: Payer: Self-pay

## 2020-07-10 ENCOUNTER — Ambulatory Visit (INDEPENDENT_AMBULATORY_CARE_PROVIDER_SITE_OTHER): Payer: Medicare Other | Admitting: Family Medicine

## 2020-07-10 VITALS — BP 148/72 | HR 70 | Temp 97.5°F | Ht 66.0 in | Wt 169.0 lb

## 2020-07-10 DIAGNOSIS — I2510 Atherosclerotic heart disease of native coronary artery without angina pectoris: Secondary | ICD-10-CM

## 2020-07-10 DIAGNOSIS — I69351 Hemiplegia and hemiparesis following cerebral infarction affecting right dominant side: Secondary | ICD-10-CM

## 2020-07-10 DIAGNOSIS — R600 Localized edema: Secondary | ICD-10-CM | POA: Diagnosis not present

## 2020-07-10 DIAGNOSIS — H538 Other visual disturbances: Secondary | ICD-10-CM | POA: Insufficient documentation

## 2020-07-10 DIAGNOSIS — M65322 Trigger finger, left index finger: Secondary | ICD-10-CM | POA: Insufficient documentation

## 2020-07-10 DIAGNOSIS — R7303 Prediabetes: Secondary | ICD-10-CM

## 2020-07-10 DIAGNOSIS — I10 Essential (primary) hypertension: Secondary | ICD-10-CM

## 2020-07-10 NOTE — Assessment & Plan Note (Signed)
Was told by optometrist to see a specialist about early signs of glaucoma. He notes some blurry vision, no pain. Referral placed

## 2020-07-10 NOTE — Assessment & Plan Note (Signed)
Pt with difficulty cutting toenails and was previously seeing podiatry due to neuropathy and swelling for skin checks. Referral placed. Appreciate podiatry support.

## 2020-07-10 NOTE — Assessment & Plan Note (Addendum)
Reports normal bp at home, however, several high office BP. Cont lisinopril 10 mg, clonidine 0.1 mg bid, amlodipine 2.5 mg. F/u 3 months for bp check.

## 2020-07-10 NOTE — Patient Instructions (Signed)
Call Dr. Martinique if you get the chest pain again  Call if you noticed higher blood pressure at home   #Referral I have placed a referral to a specialist for you. You should receive a phone call from the specialty office. Make sure your voicemail is not full and that if you are able to answer your phone to unknown or new numbers.   It may take up to 2 weeks to hear about the referral. If you do not hear anything in 2 weeks, please call our office and ask to speak with the referral coordinator.

## 2020-07-10 NOTE — Assessment & Plan Note (Signed)
Pt notes hx of trigger finger which responded to injection. Is interested in surgery. Referral to hand surgeon for discussion

## 2020-07-10 NOTE — Assessment & Plan Note (Signed)
CP episode x 1 resolved with nitro. Advised calling cardiology if recurrence. Cont prn nitro.

## 2020-07-10 NOTE — Progress Notes (Signed)
Subjective:     Cory [name] is a 80 y.o. male presenting for No chief complaint on file.     HPI  #Chest pain - took one of his nitro pills  - this resolved the symptoms - no longer has pain - previous cardiologist to him this would help - Follows with Dr. 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 Edilia Bo  #blurry vision  - saw optometrist and told to get evaluated for glaucoma  #foot concern - partial paralysis and numbness in the right -   Review of Systems   Social History   Tobacco Use  Smoking Status Former Smoker  . Types: Pipe  Smokeless Tobacco Never Used        Objective:    BP Readings from Last 3 Encounters:  07/10/20 (!) 148/72  06/27/20 (!) 157/87  05/30/20 (!) 174/89   Wt Readings from Last 3 Encounters:  07/10/20 169 lb (76.7 kg)  06/27/20 156 lb (70.8 kg)  05/30/20 160 lb (72.6 kg)    BP (!) 148/72   Pulse 70   Temp (!) 97.5 F (36.4 C) (Temporal)   Ht 5\' 6"  (1.676 m)   Wt 169 lb (76.7 kg)   SpO2 97%   BMI 27.28 kg/m    Physical Exam Constitutional:      Appearance: Normal appearance. He is not ill-appearing or diaphoretic.  HENT:     Right Ear: External ear normal.     Left Ear: External ear normal.  Eyes:     General: No scleral icterus.    Extraocular Movements: Extraocular movements intact.     Conjunctiva/sclera: Conjunctivae normal.  Cardiovascular:     Rate and Rhythm: Normal rate.     Pulses:          Dorsalis pedis pulses are 2+ on the right side and 2+ on the left side.       Posterior tibial pulses are 2+ on the right side and 2+ on the left side.  Pulmonary:     Effort: Pulmonary effort is normal.  Musculoskeletal:     Cervical back: Neck supple.  Feet:     Right foot:     Skin integrity: No ulcer  or skin breakdown.     Toenail Condition: Right toenails are long.     Left foot:     Skin integrity: No ulcer or skin breakdown.     Toenail Condition: Left toenails are long.     Comments: Right LE edema at baseline. Smooth hairless skin on the foot.  Skin:    General: Skin is warm and dry.  Neurological:     Mental Status: He is alert. Mental status is at baseline.  Psychiatric:        Mood and Affect: Mood normal.           Assessment & Plan:   Problem List Items Addressed This Visit      Cardiovascular and Mediastinum   Hypertension    Reports normal bp at home, however, several high office BP. Cont lisinopril 10 mg, clonidine 0.1 mg bid, amlodipine 2.5 mg. F/u 3 months for bp check.       CAD (coronary artery disease), native coronary artery    CP episode  x 1 resolved with nitro. Advised calling cardiology if recurrence. Cont prn nitro.         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 Referral placed. Appreciate podiatry support.       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 Is interested in surgery. Referral to hand surgeon for discussion      Relevant Orders   Ambulatory referral to Hand Surgery     Other   Prediabetes   Relevant Orders   Ambulatory referral to Podiatry   Lower extremity edema   Relevant Orders   Ambulatory referral to Podiatry   Blurry vision    Was told by optometrist to see a specialist about early signs of glaucoma. He notes some blurry vision, no pain. Referral placed      Relevant Orders   Ambulatory referral to Ophthalmology       Return in about 3 months (around 10/10/2020) for BP  follow-up.  [name], MD  This visit occurred during the SARS-CoV-2 public health emergency.  Safety protocols were in place, including screening questions prior to the visit, additional usage of staff PPE, and extensive cleaning of exam room while observing appropriate contact time as indicated for disinfecting solutions.

## 2020-07-14 ENCOUNTER — Ambulatory Visit (INDEPENDENT_AMBULATORY_CARE_PROVIDER_SITE_OTHER): Payer: Medicare Other | Admitting: Podiatry

## 2020-07-14 ENCOUNTER — Encounter: Payer: Self-pay | Admitting: Podiatry

## 2020-07-14 ENCOUNTER — Other Ambulatory Visit: Payer: Self-pay

## 2020-07-14 DIAGNOSIS — E0843 Diabetes mellitus due to underlying condition with diabetic autonomic (poly)neuropathy: Secondary | ICD-10-CM

## 2020-07-14 DIAGNOSIS — M79674 Pain in right toe(s): Secondary | ICD-10-CM

## 2020-07-14 DIAGNOSIS — M79675 Pain in left toe(s): Secondary | ICD-10-CM | POA: Diagnosis not present

## 2020-07-14 DIAGNOSIS — B351 Tinea unguium: Secondary | ICD-10-CM

## 2020-07-14 NOTE — Progress Notes (Signed)
   SUBJECTIVE Patient with a history of diabetes mellitus presents to office today complaining of elongated, thickened nails that cause pain while ambulating in shoes.  He is unable to trim his own nails. Patient is here for further evaluation and treatment.   Past Medical History:  Diagnosis Date  . CAD (coronary artery disease)   . GERD (gastroesophageal reflux disease)   . Heart murmur   . Hyperlipidemia   . Hypertension   . Stroke ([place])   . Vasovagal syncope     OBJECTIVE General Patient is awake, alert, and oriented x 3 and in no acute distress. Derm Skin is dry and supple bilateral. Negative open lesions or macerations. Remaining integument unremarkable. Nails are tender, long, thickened and dystrophic with subungual debris, consistent with onychomycosis, 1-5 bilateral. No signs of infection noted. Vasc  DP and PT pedal pulses palpable bilaterally. Temperature gradient within normal limits.  Neuro Epicritic and protective threshold sensation diminished bilaterally.  Musculoskeletal Exam No symptomatic pedal deformities noted bilateral. Muscular strength within normal limits.  ASSESSMENT 1. Diabetes Mellitus w/ peripheral neuropathy 2. Onychomycosis of nail due to dermatophyte bilateral 3. Pain in foot bilateral  PLAN OF CARE 1. Patient evaluated today. 2. Instructed to maintain good pedal hygiene and foot care. Stressed importance of controlling blood sugar.  3. Mechanical debridement of nails 1-5 bilaterally performed using a nail nipper. Filed with dremel without incident.  4. Return to clinic in 3 mos.     Edrick Kins, DPM [facility]  Dr. Edrick Kins, [address]                                        [place], [place] [address]                Office [phone]  Fax [phone]

## 2020-07-18 ENCOUNTER — Other Ambulatory Visit: Payer: Self-pay

## 2020-07-18 ENCOUNTER — Ambulatory Visit (INDEPENDENT_AMBULATORY_CARE_PROVIDER_SITE_OTHER): Payer: Medicare Other | Admitting: Urology

## 2020-07-18 ENCOUNTER — Ambulatory Visit: Payer: Medicare Other | Attending: Neurology | Admitting: Speech Pathology

## 2020-07-18 VITALS — BP 190/85 | HR 79 | Wt 169.0 lb

## 2020-07-18 DIAGNOSIS — R41841 Cognitive communication deficit: Secondary | ICD-10-CM | POA: Diagnosis present

## 2020-07-18 DIAGNOSIS — R4701 Aphasia: Secondary | ICD-10-CM | POA: Insufficient documentation

## 2020-07-18 DIAGNOSIS — N138 Other obstructive and reflux uropathy: Secondary | ICD-10-CM

## 2020-07-18 DIAGNOSIS — N401 Enlarged prostate with lower urinary tract symptoms: Secondary | ICD-10-CM

## 2020-07-18 DIAGNOSIS — R350 Frequency of micturition: Secondary | ICD-10-CM | POA: Diagnosis not present

## 2020-07-18 LAB — MICROSCOPIC EXAMINATION: Bacteria, UA: NONE SEEN

## 2020-07-18 LAB — URINALYSIS, COMPLETE
Bilirubin, UA: NEGATIVE
Glucose, UA: NEGATIVE
Ketones, UA: NEGATIVE
Leukocytes,UA: NEGATIVE
Nitrite, UA: NEGATIVE
Protein,UA: NEGATIVE
RBC, UA: NEGATIVE
Specific Gravity, UA: 1.015 (ref 1.005–1.030)
Urobilinogen, Ur: 0.2 mg/dL (ref 0.2–1.0)
pH, UA: 5.5 (ref 5.0–7.5)

## 2020-07-18 NOTE — Progress Notes (Signed)
07/18/20  Chief Complaint  Patient presents with  . Cysto     HPI: 80 year old male with refractory urinary symptoms related to BPH who presents today to the office for cystoscopy and prostate sizing/   Please see previous notes for details.    Since last visit, he was restarted on Flomax along with given Gemtesa samples.  He has improved.   Blood pressure (!) 190/85, pulse 79, weight 169 lb (76.7 kg). NED. A&Ox3.   No respiratory distress   Abd soft, NT, ND Normal phallus with bilateral descended testicles    Cystoscopy Procedure Note  Patient identification was confirmed, informed consent was obtained, and patient was prepped using Betadine solution.  Lidocaine jelly was administered per urethral meatus.    Preoperative abx where received prior to procedure.     Pre-Procedure: - Inspection reveals a normal caliber ureteral meatus.  Procedure: The flexible cystoscope was introduced without difficulty - No urethral strictures/lesions are present. - Enlarged prostate with previous green light changes appreciated - Normal bladder neck - Bilateral ureteral orifices identified - Bladder mucosa  reveals no ulcers, tumors, or lesions - No bladder stones - Mild trabeculation  Retroflexion unremarkable   Post-Procedure: - Patient tolerated the procedure well   Prostate transrectal ultrasound sizing   Informed consent was obtained after discussing risks/benefits of the procedure.  A time out was performed to ensure correct patient identity.   Pre-Procedure: -Transrectal probe was placed without difficulty -Transrectal Ultrasound performed revealing a 42 gm prostate measuring 3.66 x 4.00 x 5.49 cm (length) -No significant hypoechoic or median lobe noted     Assessment/ Plan:  1. Urinary frequency Likely multifactorial - Urinalysis, Complete  2. Benign prostatic hyperplasia with urinary obstruction Mild prostamegaly with bladder trabeculation and evidence of  previous laser ablation, outlet likely playing role in poorly controlled urinary symptoms  We discussed various treatment options today at length.  He may consider an outlet procedure down the road.  He may be best suited with consideration of a TURP versus UroLift.  We discussed risks and benefits of each.  At this point in time, he like to hold off and will reevaluate again in 3 months.  He is agreeable this plan. - Urinalysis, Complete   F/u 3 months with IPSS/ PVR  Hollice Espy, MD

## 2020-07-19 ENCOUNTER — Encounter: Payer: Self-pay | Admitting: Speech Pathology

## 2020-07-19 NOTE — Therapy (Signed)
[place] MAIN [facility] SERVICES [address] [place], [place], [address] Phone: [phone]   Fax:  [phone]  Speech Language Pathology Evaluation  Patient Details  Name: Cory [name] MRN: [id number] Date of Birth: 01/03/1941 Referring Provider (SLP): Dr. Manuella Ghazi   Encounter Date: 07/18/2020   End of Session - 07/19/20 0830    Visit Number 1    Number of Visits 1    Date for SLP Re-Evaluation 07/18/20    SLP Start Time 62    SLP Stop Time  1400    SLP Time Calculation (min) 60 min    Activity Tolerance Patient tolerated treatment well           Past Medical History:  Diagnosis Date  . CAD (coronary artery disease)   . GERD (gastroesophageal reflux disease)   . Heart murmur   . Hyperlipidemia   . Hypertension   . Stroke ([place])   . Vasovagal syncope     Past Surgical History:  Procedure Laterality Date  . BACK SURGERY    . CHOLECYSTECTOMY    . CORONARY ARTERY BYPASS GRAFT  2000  . NECK SURGERY      There were no vitals filed for this visit.   Subjective Assessment - 07/18/20 1303    Subjective "I have a problem getting words out sometimes."    Currently in Pain? Yes    Pain Score 5     Pain Location Hand    Pain Orientation Right;Left    Pain Descriptors / Indicators Aching    Pain Type Chronic pain              SLP Evaluation [facility] - 07/19/20 0001      SLP Visit Information   SLP Received On 07/18/20    Referring Provider (SLP) Dr. Manuella Ghazi    Onset Date 07/13/20    Medical Diagnosis cognitive impairment (CVA in 2015)      Subjective   Subjective --    Patient/Family Stated Goal not have to search for words      General Information   HPI Cory [name] is an 80 y.o. male with past medical history noted for CVA (2015), multiple TIAs, cognitive changes, aneurysm, CAD, hiatal hernia, Barrett's esophagus, and osteoarthritis. MRI 05/06/20 showed moderately advanced age-related cerebral atrophy, moderate  chronic small vessel ischemic disease, remote lacunar infarcts of bilateral basal ganglia, white matter and pons.    Behavioral/Cognition alert, cooperative    Mobility Status ambulated with cane      Balance Screen   Has the patient fallen in the past 6 months No    Has the patient had a decrease in activity level because of a fear of falling?  No    Is the patient reluctant to leave their home because of a fear of falling?  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 Status History of cognitive impairments - at baseline   See CLQT assessment results below     Auditory Comprehension   Overall Auditory Comprehension Appears within functional limits for tasks assessed      Visual Recognition/Discrimination   Discrimination Not tested  Reading Comprehension   Reading Status Within funtional limits      Expression   Primary Mode of Expression Verbal      Verbal Expression   Overall Verbal Expression Impaired    Initiation No impairment    Automatic Speech Name;Social Response    Level of Generative/Spontaneous Verbalization Conversation    Repetition No impairment    Naming No impairment   hesitation but WNL for age   Pragmatics No impairment    Effective Techniques Other (Comment)   extra time   Non-Verbal Means of Communication Not applicable      Written Expression   Dominant Hand Right    Written Expression Not tested      Oral Motor/Sensory Function   Overall Oral Motor/Sensory Function Appears within functional limits for tasks assessed      Motor Speech   Overall Motor Speech Appears within functional limits for tasks assessed      Standardized Assessments   Standardized Assessments  Cognitive  Linguistic Quick Test;[place] Naming Test-2nd edition    Boston Naming Test-2nd edition  56/60        AGE - 70 - 89   Cognitive Linguistic Quick Test   The Cognitive Linguistic Quick Test (CLQT) was administered to assess the relative status of five cognitive domains: attention, memory, language, executive functioning, and visuospatial skills. Scores from 10 tasks were used to estimate severity ratings (for age groups 18-69 years and 70-89 years) for each domain, a clock drawing task, as well as an overall composite severity rating of cognition.        Task    Score  Criterion Cut Scores   Personal Facts            8/8   8    Symbol Cancellation           12/12   10  Confrontation Naming         10/10   10   Clock Drawing  11/13   11   Story Retelling  10/10   5   Symbol Trails   10/10   6   Generative Naming      4/9   4   Design Memory                        6/6   4   [place]        8/8   4   Design Generation    5/13   5   Cognitive Domain  Composite Score Severity Rating   Attention   207/215  WNL    Memory   180/185  WNL   Executive Function  27/40   WNL   Language   32/37   WNL   Visuospatial Skills  97/105  WNL   Clock Drawing  11/13   WNL   Composite Severity Rating WNL                          SLP Education - 07/19/20 0830    Education Details cognitive/language stimulating activities for home    Person(s) Educated Patient    Methods Explanation    Comprehension Verbalized understanding                Plan - 07/19/20 0831    Clinical Impression Statement Patient presents with mild anomia, hesitation in conversation. He scored WNL on the [place] [phone]), as  well as WNL for age on cognitive-communication assessment in domains of attention, memory, executive function, language and visuospatial skills (CLQT). Pt does complain of frustration with delays in thinking of words; also reports functional short-term memory  deficits and compensates by using a calendar. Pt recently moved to [place], and admits he has fewer social interactions these days. He is a caregiver for his wife. SLP educated on neuroplasticity as well as compensations for anomia; and that pt may benefit from working on these during brief course of therapy. At this time pt feels functional impact is not significant enough for him to pursue therapy at this time; he would like to hold on therapy for now, discuss during his next follow-up with Dr. Manuella Ghazi and would be open to referral in the future. SLP encouraged pt to seek out communication opportunities to reduce social isolation and increase language stimulation; encouraged pt to have discussions re: what he is reading or watching on TV. No further skilled ST at this time; evaluation only.    Speech Therapy Frequency One time visit    Duration Other (comment)   eval only   Treatment/Interventions Language facilitation;Environmental controls;SLP instruction and feedback;Compensatory techniques;Functional tasks;Patient/family education    Consulted and Agree with Plan of Care Patient           Patient will benefit from skilled therapeutic intervention in order to improve the following deficits and impairments:   Aphasia  Cognitive communication deficit    Problem List Patient Active Problem List   Diagnosis Date Noted  . Trigger index finger of left hand 07/10/2020  . Blurry vision 07/10/2020  . Aneurysm ([place]) 05/11/2020  . TIA (transient ischemic attack) 05/06/2020  . Prediabetes 04/13/2020  . Osteoarthritis 04/13/2020  . Hypertension 04/13/2020  . CAD (coronary artery disease), native coronary artery 04/13/2020  . History of CVA with residual deficit 04/13/2020  . 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 Hiatal hernia 04/13/2020  . Seasonal allergies 04/13/2020  . 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 Nocturia 04/13/2020  . Lower  extremity edema 04/13/2020  . BPH with urinary obstruction 12/11/2015   Deneise Lever, [place], CCC-SLP Speech-Language Pathologist   Aliene Altes 07/19/2020, 8:39 AM  [place] MAIN [facility] SERVICES [address] [place], [place], [address] Phone: [phone]   Fax:  [phone]  Name: MISCHA POLLARD MRN: [id number] Date of Birth: 1940-11-15

## 2020-07-25 ENCOUNTER — Ambulatory Visit: Payer: Medicare Other | Admitting: Speech Pathology

## 2020-07-27 ENCOUNTER — Encounter: Payer: Medicare Other | Admitting: Speech Pathology

## 2020-08-01 ENCOUNTER — Encounter: Payer: Medicare Other | Admitting: Speech Pathology

## 2020-08-03 ENCOUNTER — Encounter: Payer: Medicare Other | Admitting: Speech Pathology

## 2020-08-08 ENCOUNTER — Encounter: Payer: Medicare Other | Admitting: Speech Pathology

## 2020-08-10 ENCOUNTER — Ambulatory Visit: Payer: Medicare Other | Admitting: Family Medicine

## 2020-08-10 ENCOUNTER — Encounter: Payer: Medicare Other | Admitting: Speech Pathology

## 2020-08-15 ENCOUNTER — Encounter: Payer: Medicare Other | Admitting: Speech Pathology

## 2020-08-17 ENCOUNTER — Encounter: Payer: Medicare Other | Admitting: Speech Pathology

## 2020-08-22 ENCOUNTER — Encounter: Payer: Medicare Other | Admitting: Speech Pathology

## 2020-08-24 ENCOUNTER — Encounter: Payer: Medicare Other | Admitting: Speech Pathology

## 2020-08-29 ENCOUNTER — Encounter: Payer: Medicare Other | Admitting: Speech Pathology

## 2020-08-30 ENCOUNTER — Ambulatory Visit (INDEPENDENT_AMBULATORY_CARE_PROVIDER_SITE_OTHER): Payer: Medicare Other | Admitting: Family Medicine

## 2020-08-30 ENCOUNTER — Other Ambulatory Visit: Payer: Self-pay

## 2020-08-30 VITALS — BP 128/62 | HR 74 | Temp 98.2°F | Wt 167.5 lb

## 2020-08-30 DIAGNOSIS — R7303 Prediabetes: Secondary | ICD-10-CM | POA: Diagnosis not present

## 2020-08-30 DIAGNOSIS — I69351 Hemiplegia and hemiparesis following cerebral infarction affecting right dominant side: Secondary | ICD-10-CM | POA: Diagnosis not present

## 2020-08-30 DIAGNOSIS — K22719 Barrett's esophagus with dysplasia, unspecified: Secondary | ICD-10-CM

## 2020-08-30 DIAGNOSIS — Z85828 Personal history of other malignant neoplasm of skin: Secondary | ICD-10-CM | POA: Diagnosis not present

## 2020-08-30 DIAGNOSIS — L989 Disorder of the skin and subcutaneous tissue, unspecified: Secondary | ICD-10-CM

## 2020-08-30 NOTE — Assessment & Plan Note (Signed)
Lab Results  Component Value Date   HGBA1C 6.0 (H) 05/07/2020   Well controlled. Cont metformin 500 mg daily

## 2020-08-30 NOTE — Assessment & Plan Note (Signed)
Skin lesion today concerning for BCC. Will message dermatology to see if he can get an earlier appointment.

## 2020-08-30 NOTE — Assessment & Plan Note (Signed)
Stable. Cont atorvastatin 40 mg, asa 81 mg. Cont cane for support. Handicap form today

## 2020-08-30 NOTE — Assessment & Plan Note (Signed)
Worsening reflux on lower dose of protonix. Increase protonix 40 mg back to BID. Cont pepcid at night. Call if no improvement in 2 weeks.

## 2020-08-30 NOTE — Progress Notes (Signed)
Subjective:     Cory [name] is a 80 y.o. male presenting for Abrasion (Several places on top of head that won't heal. Has had them frozen by dermatology. )     HPI   #Scalp lesion - present for 2-3 months - has several lesions on the face - had some stuck on lesions well which have been frozen in the past but never fell off - does not have dermatology f/u until 01/2021 with Dr. Nehemiah Massed  - hx of [facility] - this has grown in size  #prior stroke and leg injury - wanting handicap sticker for care - ambulates with cane - does good some days  #GERd - worse in setting of decreasing protonix to once daily  Review of Systems   Social History   Tobacco Use  Smoking Status Former Smoker  . Types: Pipe  Smokeless Tobacco Never Used        Objective:    BP Readings from Last 3 Encounters:  08/30/20 128/62  07/18/20 (!) 190/85  07/10/20 (!) 148/72   Wt Readings from Last 3 Encounters:  08/30/20 167 lb 8 oz (76 kg)  07/18/20 169 lb (76.7 kg)  07/10/20 169 lb (76.7 kg)    BP 128/62   Pulse 74   Temp 98.2 F (36.8 C) (Temporal)   Wt 167 lb 8 oz (76 kg)   SpO2 96%   BMI 27.04 kg/m    Physical Exam Constitutional:      Appearance: Normal appearance. He is not ill-appearing or diaphoretic.  HENT:     Right Ear: External ear normal.     Left Ear: External ear normal.     Nose: Nose normal.  Eyes:     General: No scleral icterus.    Extraocular Movements: Extraocular movements intact.     Conjunctiva/sclera: Conjunctivae normal.  Cardiovascular:     Rate and Rhythm: Normal rate.  Pulmonary:     Effort: Pulmonary effort is normal.  Musculoskeletal:     Cervical back: Neck supple.  Skin:    General: Skin is warm and dry.     Comments: Scalp with raised red lesion with central dimple ttp. Adjacent raised scaly lesions on the scalp.    Neurological:     Mental Status: He is alert. Mental status is at baseline.  Psychiatric:        Mood and Affect:  Mood normal.             Assessment & Plan:   Problem List Items Addressed This Visit      Digestive   Barrett esophagus    Worsening reflux on lower dose of protonix. Increase protonix 40 mg back to BID. Cont pepcid at night. Call if no improvement in 2 weeks.         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 Cont atorvastatin 40 mg, asa 81 mg. Cont cane for support. Handicap form today        Musculoskeletal and Integument   History of basal cell carcinoma (BCC)    Skin lesion today concerning for BCC. Will message dermatology to see if he can get an earlier appointment.         Other   Prediabetes    Lab Results  Component Value Date   HGBA1C 6.0 (H) 05/07/2020   Well controlled. Cont metformin 500 mg daily  Other Visit Diagnoses    Skin lesion of scalp         Scalp lesion - concerning for bcc - will message dermatology to have him seen as soon as possible.    Return in about 2 months (around 10/30/2020) for check in with labs.  [name], MD  This visit occurred during the SARS-CoV-2 public health emergency.  Safety protocols were in place, including screening questions prior to the visit, additional usage of staff PPE, and extensive cleaning of exam room while observing appropriate contact time as indicated for disinfecting solutions.

## 2020-08-30 NOTE — Patient Instructions (Signed)
#  Skin lesion - I will message dermatology - you should hear from their office  #Reflux - return to pantoprazole twice daily - call back in 2 weeks with update

## 2020-08-31 ENCOUNTER — Encounter: Payer: Medicare Other | Admitting: Speech Pathology

## 2020-09-05 ENCOUNTER — Encounter: Payer: Medicare Other | Admitting: Speech Pathology

## 2020-09-07 ENCOUNTER — Encounter: Payer: Medicare Other | Admitting: Speech Pathology

## 2020-09-11 ENCOUNTER — Ambulatory Visit: Payer: Medicare Other | Admitting: Cardiology

## 2020-09-12 ENCOUNTER — Encounter: Payer: Medicare Other | Admitting: Speech Pathology

## 2020-09-14 ENCOUNTER — Encounter: Payer: Medicare Other | Admitting: Speech Pathology

## 2020-09-19 ENCOUNTER — Encounter: Payer: Medicare Other | Admitting: Speech Pathology

## 2020-09-21 ENCOUNTER — Encounter: Payer: Medicare Other | Admitting: Speech Pathology

## 2020-09-22 ENCOUNTER — Ambulatory Visit: Payer: Medicare Other | Admitting: Family

## 2020-09-26 ENCOUNTER — Encounter: Payer: Medicare Other | Admitting: Speech Pathology

## 2020-09-26 ENCOUNTER — Telehealth: Payer: Self-pay

## 2020-09-26 ENCOUNTER — Other Ambulatory Visit: Payer: Self-pay

## 2020-09-26 ENCOUNTER — Encounter: Payer: Self-pay | Admitting: Physician Assistant

## 2020-09-26 ENCOUNTER — Ambulatory Visit (INDEPENDENT_AMBULATORY_CARE_PROVIDER_SITE_OTHER): Payer: Medicare Other | Admitting: Physician Assistant

## 2020-09-26 VITALS — BP 132/68 | HR 70 | Resp 18 | Ht 66.0 in | Wt 168.0 lb

## 2020-09-26 DIAGNOSIS — I25810 Atherosclerosis of coronary artery bypass graft(s) without angina pectoris: Secondary | ICD-10-CM

## 2020-09-26 DIAGNOSIS — E119 Type 2 diabetes mellitus without complications: Secondary | ICD-10-CM

## 2020-09-26 DIAGNOSIS — E785 Hyperlipidemia, unspecified: Secondary | ICD-10-CM

## 2020-09-26 DIAGNOSIS — Z8673 Personal history of transient ischemic attack (TIA), and cerebral infarction without residual deficits: Secondary | ICD-10-CM | POA: Diagnosis not present

## 2020-09-26 DIAGNOSIS — I10 Essential (primary) hypertension: Secondary | ICD-10-CM | POA: Diagnosis not present

## 2020-09-26 DIAGNOSIS — I779 Disorder of arteries and arterioles, unspecified: Secondary | ICD-10-CM | POA: Diagnosis not present

## 2020-09-26 DIAGNOSIS — I739 Peripheral vascular disease, unspecified: Secondary | ICD-10-CM

## 2020-09-26 NOTE — Telephone Encounter (Signed)
That is fine with me.  Nelva Bush, MD [facility]

## 2020-09-26 NOTE — Telephone Encounter (Signed)
Patient was seen today at the [place] office by Almyra Deforest, PA-C and asked if there was another office that was closer to him. Patient would like to switch from the care of Dr. [place] to the care of Dr. Saunders Revel at the [facility] office.

## 2020-09-26 NOTE — Progress Notes (Signed)
Cardiology Office Note:    Date:  09/28/2020   ID:  Cory [name], DOB [birth year], MRN [id number]  PCP:  [name], MD   [facility] Providers Cardiologist:  Peter Martinique, MD {   Referring MD: [name], MD   Chief Complaint  Patient presents with  . Follow-up    Seen for Dr. Martinique    History of Present Illness:    Cory [name] is a 80 y.o. male with a hx of CAD s/p CABG, h/o CVA, HTN, HLD, DM II and a history of vasovagal syncope.  Patient had CABG in 2000 in [place].  Myoview in November 2016 showed fixed inferior defect consistent with scar and a small reversible basal to mid anterior wall defect, EF 52%.  He had progressive angina UDS December 2019, repeat cardiac catheterization showed patent LIMA to LAD, sequential SVG to diagonal and the distal LAD, SVG to ramus, SVG to OM and SVG to PDA, there was a stenosis in the distal SVG to OM that was treated percutaneously.  Unfortunately, records do not mention what type of PCI was done.  Postprocedure, he developed aphasia and confusion, stroke work-up was negative and his symptom was cleared.  This was felt to be related to effect of sedation.  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  He has a history of CVA in 2015 and 2 subsequent TIAs since then.  Previous heart monitor shows rare PACs and PVCs, no evidence of A. fib.  Echocardiogram performed in the summer 2020 showed normal EF, mild TR and MR.  He was last seen by Dr. Martinique in November 2021 at which time he was doing well.  He had moved to [place] to be closer to his family.  Unfortunately since last visit, patient went to [facility] ED on 05/06/2020 with expressive aphasia.  Initial CT scan was negative for acute abnormality.  MRA and MRI revealed a incidental finding of small aneurysm, recommend to follow-up with neurosurgery as outpatient.  Patient was  discharged on aspirin and Plavix.  Echocardiogram obtained during the hospitalization showed normal EF, no wall motion abnormality, no embolic source.  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  Patient was advised to follow-up with vascular surgery as outpatient.  Since discharge, he has been seen by neurosurgery at [facility] Dr. 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  It was recommended for him to continue neurology follow-up.  It does not appear he has been seen by vascular surgery.  Patient presents today for follow-up.  He denies any chest pain or shortness of breath.  Blood pressure is well controlled.  He walks with a walker.  He has not been seen by vascular surgery however neurosurgery Dr. Lacinda Axon did not recommend further surgery at this time.  He is being followed by neurology service for recurrent TIA.  He denies any palpitation recently.  He may qualify for loop recorder at some point given the recurrence of TIA.  He does have claudication symptoms and especially pain in the calf after walking certain distance.  I recommended the lower extremity ABI.  He also wished to switch from cardiologist in [place] to [place] area due to closer proximity to where he left.  Given  his significant cardiovascular disease, I will try to set him up with Dr. Saunders Revel.    Past Medical History:  Diagnosis Date  . CAD (coronary artery disease)   . GERD (gastroesophageal reflux disease)   . Heart murmur   . Hyperlipidemia   . Hypertension   . Stroke ([place])   . Vasovagal syncope     Past Surgical History:  Procedure Laterality Date  . BACK SURGERY    . CHOLECYSTECTOMY    . CORONARY ARTERY BYPASS GRAFT  2000  . NECK SURGERY      Current Medications: Current Meds  Medication Sig  . acetaminophen (TYLENOL) 500 MG tablet Take 1,000 mg by mouth at  bedtime.  Marland Kitchen amLODipine (NORVASC) 5 MG tablet Take 5 mg by mouth daily.  Marland Kitchen aspirin EC 81 MG tablet Take 1 tablet (81 mg total) by mouth daily. Swallow whole.  Marland Kitchen atorvastatin (LIPITOR) 40 MG tablet Take 1 tablet (40 mg total) by mouth daily.  . cloNIDine (CATAPRES) 0.1 MG tablet Take 1 tablet (0.1 mg total) by mouth 2 (two) times daily.  . clopidogrel (PLAVIX) 75 MG tablet Take 1 tablet (75 mg total) by mouth daily.  . cycloSPORINE (RESTASIS) 0.05 % ophthalmic emulsion 1 drop 2 (two) times a day.  . diclofenac Sodium (VOLTAREN) 1 % GEL Apply 2 g topically as needed.  . famotidine (PEPCID) 20 MG tablet Take 20 mg by mouth at bedtime.  . fluticasone (FLONASE) 50 MCG/ACT nasal spray 1 spray by Each Nare route nightly.  Marland Kitchen lisinopril (ZESTRIL) 10 MG tablet Take 1 tablet (10 mg total) by mouth daily.  . melatonin 5 MG TABS Take 5 mg by mouth at bedtime.  . metFORMIN (GLUCOPHAGE) 500 MG tablet Take 1 tablet (500 mg total) by mouth daily with breakfast.  . montelukast (SINGULAIR) 10 MG tablet Take 1 tablet (10 mg total) by mouth at bedtime.  . Multiple Vitamins-Minerals (PX COMPLETE SENIOR MULTIVITS) TABS Take by mouth daily.  . nitroGLYCERIN (NITROSTAT) 0.4 MG SL tablet Place 0.4 mg under the tongue every 5 (five) minutes as needed.  . pantoprazole (PROTONIX) 40 MG tablet Take 1 tablet (40 mg total) by mouth 2 (two) times daily. (Patient taking differently: Take 40 mg by mouth daily.)     Allergies:   Fesoterodine, Isosorbide, Tramadol, Grass pollen([id number]-o-[id number]-t-swt vern), Hydrocodone, Meperidine, and Penicillins   Social History   Socioeconomic History  . Marital status: Married    Spouse name: Not on file  . Number of children: Not on file  . Years of education: Not on file  . Highest education level: Not on file  Occupational History  . Occupation: retired  Tobacco Use  . Smoking status: Former Smoker    Types: Pipe  . Smokeless tobacco: Never Used  Substance and Sexual Activity  . Alcohol  use: Never  . Drug use: Yes  . Sexual activity: Yes  Other Topics Concern  . Not on file  Social History Narrative  . Not on file   Social Determinants of Health   Financial Resource Strain: Not on file  Food Insecurity: Not on file  Transportation Needs: Not on file  Physical Activity: Not on file  Stress: Not on file  Social Connections: Not on file     Family History: The patient's family history includes Heart attack in his father; Heart disease in his brother and mother.  ROS:   Please see the history of present illness.     All other systems reviewed and are  negative.  EKGs/Labs/Other Studies Reviewed:    The following studies were reviewed today:  Echo 05/07/2020 IMPRESSIONS    1. Left ventricular ejection fraction, by estimation, is 60 to 65%. The  left ventricle has normal function. The left ventricle has no regional  wall motion abnormalities. Left ventricular diastolic parameters are  consistent with Grade I diastolic  dysfunction (impaired relaxation).  2. Right ventricular systolic function is normal. The right ventricular  size is normal.  3. The mitral valve is normal in structure. Mild mitral valve  regurgitation.   EKG:  EKG is not ordered today.   Recent Labs: 03/13/2020: TSH 1.860 05/06/2020: ALT 22; Hemoglobin 11.8; Platelets 226 05/07/2020: BUN 9; Creatinine, Ser 0.71; Magnesium 2.0; Potassium 4.0; Sodium 135  Recent Lipid Panel    Component Value Date/Time   CHOL 116 05/07/2020 0512   CHOL 137 03/13/2020 1123   TRIG 101 05/07/2020 0512   HDL 40 (L) 05/07/2020 0512   HDL 47 03/13/2020 1123   CHOLHDL 2.9 05/07/2020 0512   VLDL 20 05/07/2020 0512   LDLCALC 56 05/07/2020 0512   LDLCALC 65 03/13/2020 1123     Risk Assessment/Calculations:       Physical Exam:    VS:  BP 132/68 (BP Location: Left Arm, Patient Position: Sitting, Cuff Size: Normal)   Pulse 70   Resp 18   Ht 5\' 6"  (1.676 m)   Wt 168 lb (76.2 kg)   SpO2 97%   BMI  27.12 kg/m     Wt Readings from Last 3 Encounters:  09/26/20 168 lb (76.2 kg)  08/30/20 167 lb 8 oz (76 kg)  07/18/20 169 lb (76.7 kg)     GEN:  Well nourished, well developed in no acute distress HEENT: Normal NECK: No JVD; No carotid bruits LYMPHATICS: No lymphadenopathy CARDIAC: RRR, no murmurs, rubs, gallops RESPIRATORY:  Clear to auscultation without rales, wheezing or rhonchi  ABDOMEN: Soft, non-tender, non-distended MUSCULOSKELETAL:  No edema; No deformity  SKIN: Warm and dry NEUROLOGIC:  Alert and oriented x 3 PSYCHIATRIC:  Normal affect   ASSESSMENT:    1. Carotid artery disease, unspecified laterality, unspecified type ([place])   2. Coronary artery disease involving coronary bypass graft of native heart without angina pectoris   3. H/O: CVA (cerebrovascular accident)   4. Primary hypertension   5. Hyperlipidemia LDL goal <70   6. Controlled type 2 diabetes mellitus without complication, without long-term current use of insulin (HCC)    PLAN:    In order of problems listed above:  1. Carotid artery disease: We will refer the patient to vascular surgery for follow-up on carotid artery disease in the future.  This is a nonurgent referral.  2. CAD s/p CABG: Denies any recent chest pain.  Continue on aspirin and Lipitor  3. History of CVA: On aspirin and Plavix  4. Hypertension: Blood pressure stable  5. Hyperlipidemia: On Lipitor  6. DM2: Managed by primary care provider.  7. Claudication symptom: We will obtain ABI.   Medication Adjustments/Labs and Tests Ordered: Current medicines are reviewed at length with the patient today.  Concerns regarding medicines are outlined above.  Orders Placed This Encounter  Procedures  . VAS [place] ABI WITH/WO TBI  . VAS [place] LOWER EXT ART SEG MULTI (SEGMENTALS & LE RAYNAUDS)   No orders of the defined types were placed in this encounter.   Patient Instructions  Medication Instructions:  Your physician recommends that you  continue on your current medications as directed. Please  refer to the Current Medication list given to you today.  *If you need a refill on your cardiac medications before your next appointment, please call your pharmacy*  Lab Work: NONE ordered at this time of appointment   If you have labs (blood work) drawn today and your tests are completely normal, you will receive your results only by: Marland Kitchen MyChart Message (if you have MyChart) OR . A paper copy in the mail If you have any lab test that is abnormal or we need to change your treatment, we will call you to review the results.  Testing/Procedures: Your physician has requested that you have an ankle brachial index (ABI). During this test an ultrasound and blood pressure cuff are used to evaluate the arteries that supply the arms and legs with blood. Allow thirty minutes for this exam. There are no restrictions or special instructions.   Please schedule for 2-4 weeks   Your physician has requested that you have a lower extremity arterial exercise duplex. During this test, exercise and ultrasound are used to evaluate arterial blood flow in the legs. Allow one hour for this exam. There are no restrictions or special instructions.   Please schedule for 2-4 weeks   Follow-Up: At [facility], you and your health needs are our priority.  As part of our continuing mission to provide you with exceptional heart care, we have created designated Provider Care Teams.  These Care Teams include your primary Cardiologist (physician) and Advanced Practice Providers (APPs -  Physician Assistants and Nurse Practitioners) who all work together to provide you with the care you need, when you need it.   Your next appointment:   6 month(s)  The format for your next appointment:   In Person  Provider:   Nelva Bush, MD  Other Instructions       Signed, Almyra Deforest, [place]  09/28/2020 10:04 PM    [place]

## 2020-09-26 NOTE — Telephone Encounter (Signed)
Ok with me  Jeffie Spivack Martinique MD, [facility]

## 2020-09-26 NOTE — Patient Instructions (Signed)
Medication Instructions:  Your physician recommends that you continue on your current medications as directed. Please refer to the Current Medication list given to you today.  *If you need a refill on your cardiac medications before your next appointment, please call your pharmacy*  Lab Work: NONE ordered at this time of appointment   If you have labs (blood work) drawn today and your tests are completely normal, you will receive your results only by: Marland Kitchen MyChart Message (if you have MyChart) OR . A paper copy in the mail If you have any lab test that is abnormal or we need to change your treatment, we will call you to review the results.  Testing/Procedures: Your physician has requested that you have an ankle brachial index (ABI). During this test an ultrasound and blood pressure cuff are used to evaluate the arteries that supply the arms and legs with blood. Allow thirty minutes for this exam. There are no restrictions or special instructions.   Please schedule for 2-4 weeks   Your physician has requested that you have a lower extremity arterial exercise duplex. During this test, exercise and ultrasound are used to evaluate arterial blood flow in the legs. Allow one hour for this exam. There are no restrictions or special instructions.   Please schedule for 2-4 weeks   Follow-Up: At [facility], you and your health needs are our priority.  As part of our continuing mission to provide you with exceptional heart care, we have created designated Provider Care Teams.  These Care Teams include your primary Cardiologist (physician) and Advanced Practice Providers (APPs -  Physician Assistants and Nurse Practitioners) who all work together to provide you with the care you need, when you need it.   Your next appointment:   6 month(s)  The format for your next appointment:   In Person  Provider:   Nelva Bush, MD  Other Instructions

## 2020-09-27 NOTE — Telephone Encounter (Signed)
Spoke to patient advised ok to see Dr.End in [place] in 6 months.Advised schedule is not out.Advised to call in Sept to schedule Dec  Appointment.

## 2020-09-28 ENCOUNTER — Encounter: Payer: Self-pay | Admitting: Physician Assistant

## 2020-09-28 ENCOUNTER — Encounter: Payer: Medicare Other | Admitting: Speech Pathology

## 2020-10-05 ENCOUNTER — Other Ambulatory Visit: Payer: Self-pay

## 2020-10-05 ENCOUNTER — Ambulatory Visit (INDEPENDENT_AMBULATORY_CARE_PROVIDER_SITE_OTHER): Payer: Medicare Other | Admitting: Dermatology

## 2020-10-05 DIAGNOSIS — L578 Other skin changes due to chronic exposure to nonionizing radiation: Secondary | ICD-10-CM

## 2020-10-05 DIAGNOSIS — C4442 Squamous cell carcinoma of skin of scalp and neck: Secondary | ICD-10-CM | POA: Diagnosis not present

## 2020-10-05 DIAGNOSIS — Z85828 Personal history of other malignant neoplasm of skin: Secondary | ICD-10-CM | POA: Diagnosis not present

## 2020-10-05 DIAGNOSIS — L570 Actinic keratosis: Secondary | ICD-10-CM

## 2020-10-05 DIAGNOSIS — C4492 Squamous cell carcinoma of skin, unspecified: Secondary | ICD-10-CM

## 2020-10-05 DIAGNOSIS — I25810 Atherosclerosis of coronary artery bypass graft(s) without angina pectoris: Secondary | ICD-10-CM

## 2020-10-05 DIAGNOSIS — D485 Neoplasm of uncertain behavior of skin: Secondary | ICD-10-CM

## 2020-10-05 HISTORY — DX: Squamous cell carcinoma of skin, unspecified: C44.92

## 2020-10-05 NOTE — Patient Instructions (Signed)
Wound Care Instructions  Cleanse wound gently with soap and water once a day then pat dry with clean gauze. Apply a thing coat of Petrolatum (petroleum jelly, "Vaseline") over the wound (unless you have an allergy to this). We recommend that you use a new, sterile tube of Vaseline. Do not pick or remove scabs. Do not remove the yellow or white "healing tissue" from the base of the wound.  Cover the wound with fresh, clean, nonstick gauze and secure with paper tape. You may use Band-Aids in place of gauze and tape if the would is small enough, but would recommend trimming much of the tape off as there is often too much. Sometimes Band-Aids can irritate the skin.  You should call the office for your biopsy report after 1 week if you have not already been contacted.  If you experience any problems, such as abnormal amounts of bleeding, swelling, significant bruising, significant pain, or evidence of infection, please call the office immediately.  FOR ADULT SURGERY PATIENTS: If you need something for pain relief you may take 1 extra strength Tylenol (acetaminophen) AND 2 Ibuprofen (200mg each) together every 4 hours as needed for pain. (do not take these if you are allergic to them or if you have a reason you should not take them.) Typically, you may only need pain medication for 1 to 3 days.   If you have any questions or concerns for your doctor, please call our main line at [phone] and press option 4 to reach your doctor's medical assistant. If no one answers, please leave a voicemail as directed and we will return your call as soon as possible. Messages left after 4 pm will be answered the following business day.   You may also send us a message via MyChart. We typically respond to MyChart messages within 1-2 business days.  For prescription refills, please ask your pharmacy to contact our office. Our fax number is [phone].  If you have an urgent issue when the clinic is closed that  cannot wait until the next business day, you can page your doctor at the number below.    Please note that while we do our best to be available for urgent issues outside of office hours, we are not available 24/7.   If you have an urgent issue and are unable to reach us, you may choose to seek medical care at your doctor's office, retail clinic, urgent care center, or emergency room.  If you have a medical emergency, please immediately call 911 or go to the emergency department.  Pager Numbers  - Dr. Kowalski: [phone]  - Dr. Moye: [phone]  - Dr. Stewart: [phone]  In the event of inclement weather, please call our main line at [phone] for an update on the status of any delays or closures.  Dermatology Medication Tips: Please keep the boxes that topical medications come in in order to help keep track of the instructions about where and how to use these. Pharmacies typically print the medication instructions only on the boxes and not directly on the medication tubes.   If your medication is too expensive, please contact our office at [phone] option 4 or send us a message through MyChart.   We are unable to tell what your co-pay for medications will be in advance as this is different depending on your insurance coverage. However, we may be able to find a substitute medication at lower cost or fill out paperwork to get insurance to cover a needed   medication.   If a prior authorization is required to get your medication covered by your insurance company, please allow us 1-2 business days to complete this process.  Drug prices often vary depending on where the prescription is filled and some pharmacies may offer cheaper prices.  The website www.goodrx.com contains coupons for medications through different pharmacies. The prices here do not account for what the cost may be with help from insurance (it may be cheaper with your insurance), but the website can give you the  price if you did not use any insurance.  - You can print the associated coupon and take it with your prescription to the pharmacy.  - You may also stop by our office during regular business hours and pick up a GoodRx coupon card.  - If you need your prescription sent electronically to a different pharmacy, notify our office through [facility] MyChart or by phone at [phone] option 4.   

## 2020-10-05 NOTE — Progress Notes (Signed)
New Patient Visit  Subjective  Cory [name] is a 80 y.o. male who presents for the following: Lesion (On the scalp - open, tender, growing, irregular x 6 mths. Patient has a history of BCC and SCC).  The following portions of the chart were reviewed this encounter and updated as appropriate:   Tobacco  Allergies  Meds  Problems  Med Hx  Surg Hx  Fam Hx      Review of Systems:  No other skin or systemic complaints except as noted in HPI or Assessment and Plan.  Objective  Well appearing patient in no apparent distress; mood and affect are within normal limits.  A focused examination was performed including the scalp. Relevant physical exam findings are noted in the Assessment and Plan.  L Scalp 3.0 cm volcano like ulceration     Scalp x 19 (19) Erythematous thin papules/macules with gritty scale.   Assessment & Plan  Neoplasm of uncertain behavior of skin L Scalp  Epidermal / dermal shaving  Lesion diameter (cm):  3 Informed consent: discussed and consent obtained   Timeout: patient name, date of birth, surgical site, and procedure verified   Procedure prep:  Patient was prepped and draped in usual sterile fashion Prep type:  Isopropyl alcohol Anesthesia: the lesion was anesthetized in a standard fashion   Anesthetic:  1% lidocaine w/ epinephrine 1-100,000 buffered w/ 8.4% NaHCO3 Instrument used: flexible razor blade   Hemostasis achieved with: pressure, aluminum chloride and electrodesiccation   Outcome: patient tolerated procedure well   Post-procedure details: sterile dressing applied and wound care instructions given   Dressing type: bandage and petrolatum    Destruction of lesion Complexity: extensive   Destruction method: electrodesiccation and curettage   Informed consent: discussed and consent obtained   Timeout:  patient name, date of birth, surgical site, and procedure verified Procedure prep:  Patient was prepped and draped in usual sterile  fashion Prep type:  Isopropyl alcohol Anesthesia: the lesion was anesthetized in a standard fashion   Anesthetic:  1% lidocaine w/ epinephrine 1-100,000 buffered w/ 8.4% NaHCO3 Curettage performed in three different directions: Yes   Electrodesiccation performed over the curetted area: Yes   Lesion length (cm):  3 Lesion width (cm):  3 Margin per side (cm):  0.2 Final wound size (cm):  3.4 Hemostasis achieved with:  pressure, aluminum chloride and electrodesiccation Outcome: patient tolerated procedure well with no complications   Post-procedure details: sterile dressing applied and wound care instructions given   Dressing type: bandage and petrolatum    Specimen 1 - Surgical pathology Differential Diagnosis: D48.5 r/o SCC  ED&C today  Check Margins: No 3.0 cm volcano like ulceration  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) Scalp x 19  Destruction of lesion - Scalp x 19 Complexity: simple   Destruction method: cryotherapy   Informed consent: discussed and consent obtained   Timeout:  patient name, date of birth, surgical site, and procedure verified Lesion destroyed using liquid nitrogen: Yes   Region frozen until ice ball extended beyond lesion: Yes   Outcome: patient tolerated procedure well with no complications   Post-procedure details: wound care instructions given    Actinic Damage - chronic, secondary to cumulative UV radiation exposure/sun exposure over time - diffuse scaly erythematous macules with underlying dyspigmentation - Recommend daily broad spectrum sunscreen SPF 30+ to sun-exposed areas, reapply every 2 hours as needed.  - Recommend staying in the shade or wearing long sleeves, sun glasses (UVA+UVB protection) and wide brim  hats (4-inch brim around the entire circumference of the hat). - Call for new or changing lesions.  Return in about 3 months (around 01/05/2021) for AK and bx site follow up .  Luther Redo, CMA, am acting as scribe for Sarina Ser, MD  .  Documentation: I have reviewed the above documentation for accuracy and completeness, and I agree with the above.  Sarina Ser, MD

## 2020-10-09 ENCOUNTER — Encounter: Payer: Self-pay | Admitting: Dermatology

## 2020-10-10 ENCOUNTER — Other Ambulatory Visit: Payer: Self-pay

## 2020-10-10 ENCOUNTER — Ambulatory Visit (INDEPENDENT_AMBULATORY_CARE_PROVIDER_SITE_OTHER): Payer: Medicare Other | Admitting: Family Medicine

## 2020-10-10 VITALS — BP 122/64 | HR 67 | Temp 98.4°F | Ht 66.0 in | Wt 156.5 lb

## 2020-10-10 DIAGNOSIS — I2510 Atherosclerotic heart disease of native coronary artery without angina pectoris: Secondary | ICD-10-CM | POA: Diagnosis not present

## 2020-10-10 DIAGNOSIS — R7303 Prediabetes: Secondary | ICD-10-CM

## 2020-10-10 DIAGNOSIS — I10 Essential (primary) hypertension: Secondary | ICD-10-CM | POA: Diagnosis not present

## 2020-10-10 DIAGNOSIS — M1990 Unspecified osteoarthritis, unspecified site: Secondary | ICD-10-CM

## 2020-10-10 LAB — POCT GLYCOSYLATED HEMOGLOBIN (HGB A1C): Hemoglobin A1C: 6.4 % — AB (ref 4.0–5.6)

## 2020-10-10 MED ORDER — NITROGLYCERIN 0.4 MG SL SUBL: 0.4000 mg | SUBLINGUAL_TABLET | SUBLINGUAL | 0 refills | Status: DC | PRN

## 2020-10-10 NOTE — Patient Instructions (Addendum)
Curcumin or Tumeric  Research has shown that Curcumin (Tumeric) supplements are just as good as high dose anti-inflammatory medications (like diclofenac and ibuprofen) at treating pain from osteoarthritis without the side effects.   Take Curcumin 500 mg Three times daily   -- You can find a bottle at [employer] for $8 which should last a month -- [place] is around $9 and is available at [employer]   Diabetes - work on diet - continue metformin

## 2020-10-10 NOTE — Assessment & Plan Note (Signed)
Hx of knee replacement. Using tylenol. Advised trial of tumeric

## 2020-10-10 NOTE — Assessment & Plan Note (Signed)
BP controlled. Cont lisinopril 10 mg, clonidine 0.1 mg bid, amlodipine 5 mg.

## 2020-10-10 NOTE — Assessment & Plan Note (Signed)
Lab Results  Component Value Date   HGBA1C 6.4 (A) 10/10/2020   Worse. Pt notes not as good with diet lately. Work on diet. Cont metformin 500 mg daily.

## 2020-10-10 NOTE — Assessment & Plan Note (Signed)
Rare use of nitro. Needs annual refill.

## 2020-10-10 NOTE — Progress Notes (Signed)
Subjective:     Cory [name] is a 80 y.o. 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  #skin neoplasm - suspected squamous cell carcinoma - procedure was on Thursday - using soap and water to clean this - cauterized to keep the bleeding down - continues to seep blood    Review of Systems   Social History   Tobacco Use  Smoking Status Former   Pack years: 0.00   Types: Pipe  Smokeless Tobacco Never        Objective:    BP Readings from Last 3 Encounters:  10/10/20 122/64  09/26/20 132/68  08/30/20 128/62   Wt Readings from Last 3 Encounters:  10/10/20 156 lb 8 oz (71 kg)  09/26/20 168 lb (76.2 kg)  08/30/20 167 lb 8 oz (76 kg)    BP 122/64   Pulse 67   Temp 98.4 F (36.9 C) (Temporal)   Ht 5\' 6"  (1.676 m)   Wt 156 lb 8 oz (71 kg)   SpO2 96%   BMI 25.26 kg/m    Physical Exam Constitutional:      Appearance: Normal appearance. He is not ill-appearing or diaphoretic.  HENT:     Right Ear: External ear normal.     Left Ear: External ear normal.  Eyes:     General: No scleral icterus.    Extraocular Movements: Extraocular movements intact.     Conjunctiva/sclera: Conjunctivae normal.  Cardiovascular:     Rate and Rhythm: Normal rate.  Pulmonary:     Effort: Pulmonary effort is normal.  Musculoskeletal:     Cervical back: Neck supple.  Skin:    General: Skin is warm and dry.     Comments: Large area of excisional biopsy with small amount of oozing blood. No scabs, no erythema.    Neurological:     Mental Status: He is alert. Mental status is at baseline.  Psychiatric:        Mood and Affect: Mood normal.          Assessment & Plan:   Problem List Items Addressed This Visit       Cardiovascular and Mediastinum   Hypertension    BP controlled. Cont lisinopril 10 mg, clonidine 0.1 mg bid, amlodipine 5 mg.        Relevant Medications   nitroGLYCERIN (NITROSTAT) 0.4 MG SL tablet   CAD (coronary artery disease), native coronary artery    Rare use of nitro. Needs annual refill.        Relevant Medications   nitroGLYCERIN (NITROSTAT) 0.4 MG SL tablet     Musculoskeletal and Integument   Osteoarthritis    Hx of knee replacement. Using tylenol. Advised trial of tumeric         Other   Prediabetes - Primary    Lab Results  Component Value Date   HGBA1C 6.4 (A) 10/10/2020  Worse. Pt notes not as good with diet lately. Work on diet. Cont metformin 500 mg daily.        Relevant Orders  POCT glycosylated hemoglobin (Hb A1C) (Completed)     Return in about 8 months (around 06/12/2021) for Medicare Wellness visit.  [name], MD  This visit occurred during the SARS-CoV-2 public health emergency.  Safety protocols were in place, including screening questions prior to the visit, additional usage of staff PPE, and extensive cleaning of exam room while observing appropriate contact time as indicated for disinfecting solutions.

## 2020-10-16 ENCOUNTER — Telehealth: Payer: Self-pay

## 2020-10-16 NOTE — Telephone Encounter (Signed)
Advised patient of results/hd  

## 2020-10-16 NOTE — Telephone Encounter (Signed)
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  Needs to be re-checked if any evidence of re-growth.  Call if problems.  Otherwise, Keep appt 01/16/21.

## 2020-10-18 ENCOUNTER — Other Ambulatory Visit: Payer: Self-pay

## 2020-10-18 ENCOUNTER — Ambulatory Visit (INDEPENDENT_AMBULATORY_CARE_PROVIDER_SITE_OTHER): Payer: Medicare Other | Admitting: Urology

## 2020-10-18 ENCOUNTER — Encounter: Payer: Self-pay | Admitting: Urology

## 2020-10-18 VITALS — BP 165/71 | HR 72

## 2020-10-18 DIAGNOSIS — R350 Frequency of micturition: Secondary | ICD-10-CM | POA: Diagnosis not present

## 2020-10-18 DIAGNOSIS — N138 Other obstructive and reflux uropathy: Secondary | ICD-10-CM | POA: Diagnosis not present

## 2020-10-18 DIAGNOSIS — N401 Enlarged prostate with lower urinary tract symptoms: Secondary | ICD-10-CM

## 2020-10-18 DIAGNOSIS — I25810 Atherosclerosis of coronary artery bypass graft(s) without angina pectoris: Secondary | ICD-10-CM

## 2020-10-18 LAB — BLADDER SCAN AMB NON-IMAGING: Scan Result: 14

## 2020-10-18 MED ORDER — TAMSULOSIN HCL 0.4 MG PO CAPS: 0.4000 mg | ORAL_CAPSULE | Freq: Every day | ORAL | 3 refills | Status: DC

## 2020-10-18 NOTE — Progress Notes (Signed)
10/18/2020 3:36 PM   Cory [name] 10/05/1940 [id number]  Referring provider: [name], MD [place],  [place] [address]  Chief Complaint  Patient presents with   Benign Prostatic Hypertrophy    HPI: 80 year old male who returns today for 27-month follow-up.  He has severe mixed urinary symptoms and a personal history of BPH status post laser vaporization of the prostate in 2017.  More recently, he returned for cystoscopy which revealed bladder trabeculation, evidence of previous laser ablation.  Prostate volume 42 g.    He tried and failed Myrbetriq.  He did have some improvement with combination of Gemtesa and Flomax together.  He returns to continued the discussion of how to manage his urinary symptoms.  He reports that he has not been taking his Flomax.  He is run out of this quite some time ago and did not refill it.  He also only took a few days of the [place] but then forgot about it, but it sure.  He still has it but does not really know if it was effective or not.  Overall today, symptoms are fairly stable.  His primary complaint is getting up at night as this makes him sleepy the next day along with daytime urgency frequency which limits his activity.     IPSS     Row Name 10/18/20 1500         International Prostate Symptom Score   How often have you had the sensation of not emptying your bladder? Less than 1 in 5     How often have you had to urinate less than every two hours? About half the time     How often have you found you stopped and started again several times when you urinated? Less than half the time     How often have you found it difficult to postpone urination? More than half the time     How often have you had a weak urinary stream? Not at All     How often have you had to strain to start urination? Not at All     How many times did you typically get up at night to urinate? 4 Times     Total IPSS Score 14            Quality of Life due to urinary symptoms     If you were to spend the rest of your life with your urinary condition just the way it is now how would you feel about that? Mixed             Score:  1-7 Mild 8-19 Moderate 20-35 Severe      PMH: Past Medical History:  Diagnosis Date   CAD (coronary artery disease)    GERD (gastroesophageal reflux disease)    Heart murmur    Hyperlipidemia    Hypertension    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   Left scalp - EDC   Stroke ([place])    Vasovagal syncope     Surgical History: Past Surgical History:  Procedure Laterality Date   BACK SURGERY     CHOLECYSTECTOMY     CORONARY ARTERY BYPASS GRAFT  2000   NECK SURGERY      Home Medications:  Allergies as of 10/18/2020       Reactions   Fesoterodine Other (See Comments)   Dry mouth and constipation   Isosorbide Other (See Comments)   Tramadol Other (See  Comments)   Grass Pollen(k-o-r-t-swt Vern) Other (See Comments)   Hydrocodone Anxiety   Meperidine Other (See Comments), Rash   Other reaction(s): Other (See Comments) HALLUCINATIONS   Penicillins Rash        Medication List        Accurate as of October 18, 2020  3:36 PM. If you have any questions, ask your nurse or doctor.          acetaminophen 500 MG tablet Commonly known as: TYLENOL Take 1,000 mg by mouth at bedtime.   amLODipine 5 MG tablet Commonly known as: NORVASC Take 5 mg by mouth daily.   aspirin EC 81 MG tablet Take 1 tablet (81 mg total) by mouth daily. Swallow whole.   atorvastatin 40 MG tablet Commonly known as: LIPITOR Take 1 tablet (40 mg total) by mouth daily.   cloNIDine 0.1 MG tablet Commonly known as: CATAPRES Take 1 tablet (0.1 mg total) by mouth 2 (two) times daily.   clopidogrel 75 MG tablet Commonly known as: PLAVIX Take 1 tablet (75 mg total) by mouth daily.   cycloSPORINE 0.05 % ophthalmic emulsion Commonly known as: RESTASIS 1 drop 2 (two) times a day.    diclofenac Sodium 1 % Gel Commonly known as: VOLTAREN Apply 2 g topically as needed.   famotidine 20 MG tablet Commonly known as: PEPCID Take 20 mg by mouth at bedtime.   fluticasone 50 MCG/ACT nasal spray Commonly known as: FLONASE 1 spray by Each Nare route nightly.   lisinopril 10 MG tablet Commonly known as: ZESTRIL Take 1 tablet (10 mg total) by mouth daily.   melatonin 5 MG Tabs Take 5 mg by mouth at bedtime.   metFORMIN 500 MG tablet Commonly known as: GLUCOPHAGE Take 1 tablet (500 mg total) by mouth daily with breakfast.   montelukast 10 MG tablet Commonly known as: SINGULAIR Take 1 tablet (10 mg total) by mouth at bedtime.   nitroGLYCERIN 0.4 MG SL tablet Commonly known as: NITROSTAT Place 1 tablet (0.4 mg total) under the tongue every 5 (five) minutes as needed.   pantoprazole 40 MG tablet Commonly known as: PROTONIX Take 1 tablet (40 mg total) by mouth 2 (two) times daily. What changed: when to take this   PX Complete Senior Multivits Tabs Take by mouth daily.        Allergies:  Allergies  Allergen Reactions   Fesoterodine Other (See Comments)    Dry mouth and constipation   Isosorbide Other (See Comments)   Tramadol Other (See Comments)   Grass Pollen(K-O-R-T-Swt Vern) Other (See Comments)   Hydrocodone Anxiety   Meperidine Other (See Comments) and Rash    Other reaction(s): Other (See Comments) HALLUCINATIONS    Penicillins Rash    Family History: Family History  Problem Relation Age of Onset   Heart disease Mother    Heart attack Father    Heart disease Brother     Social History:  reports that he has quit smoking. His smoking use included pipe. He has never used smokeless tobacco. He reports current drug use. He reports that he does not drink alcohol.   Physical Exam: BP (!) 165/71   Pulse 72   Constitutional:  Alert and oriented, No acute distress. HEENT: [place] AT, moist mucus membranes.  Trachea midline, no  masses. Cardiovascular: No clubbing, cyanosis, or edema. Respiratory: Normal respiratory effort, no increased work of breathing. Skin: No rashes, bruises or suspicious lesions. Neurologic: Grossly intact, no focal deficits, moving all 4 extremities. Psychiatric: Normal  mood and affect.  Laboratory Data: Lab Results  Component Value Date   CREATININE 0.71 05/07/2020    Pertinent Imaging: Results for orders placed or performed in visit on 10/18/20  BLADDER SCAN AMB NON-IMAGING  Result Value Ref Range   Scan Result 14     Assessment & Plan:    1. Benign prostatic hyperplasia with urinary obstruction Evidence of chronic outlet obstruction on cystoscopy with prostatic regrowth  We discussed consideration of outlet procedure again today and he declined.  He like to optimize pharmacotherapy.  Elected to go back on the Flomax to treat his outlet component along with treating his storage related symptoms as outlined below. - BLADDER SCAN AMB NON-IMAGING  2. Urinary frequency Ask him to try Gemtesa for at least 2 weeks consistently to see if this helps with his storage related symptoms and nocturia.  We also discussed behavioral modification.  He is willing to try this.  At the end of the month, he will call us and let us know if it effective or not.  If it is effective, will call in the prescription.  We can tweat medications based on his feedback of efficacy.    Otherwise, we will just plan to see him at 2-month interval with a IPSS/PVR and reassess his symptoms.    Hollice Espy, MD  [facility] [address] [place], [place] [address] [phone]

## 2020-10-19 ENCOUNTER — Encounter: Payer: Self-pay | Admitting: Podiatry

## 2020-10-19 ENCOUNTER — Encounter: Payer: Self-pay | Admitting: Urology

## 2020-10-19 ENCOUNTER — Ambulatory Visit (INDEPENDENT_AMBULATORY_CARE_PROVIDER_SITE_OTHER): Payer: Medicare Other | Admitting: Podiatry

## 2020-10-19 DIAGNOSIS — E0843 Diabetes mellitus due to underlying condition with diabetic autonomic (poly)neuropathy: Secondary | ICD-10-CM | POA: Diagnosis not present

## 2020-10-19 DIAGNOSIS — M79675 Pain in left toe(s): Secondary | ICD-10-CM | POA: Diagnosis not present

## 2020-10-19 DIAGNOSIS — M79674 Pain in right toe(s): Secondary | ICD-10-CM

## 2020-10-19 DIAGNOSIS — B351 Tinea unguium: Secondary | ICD-10-CM | POA: Diagnosis not present

## 2020-10-19 NOTE — Progress Notes (Signed)
This patient returns to my office for at risk foot care.  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  This patient is unable to cut nails himself since the patient cannot reach his nails.These nails are painful walking and wearing shoes.  This patient presents for at risk foot care today.  General Appearance  Alert, conversant and in no acute stress.  Vascular  Dorsalis pedis and posterior tibial  pulses are palpable  bilaterally.  Capillary return is within normal limits  bilaterally. Temperature is within normal limits  bilaterally.  Neurologic  Senn-Weinstein monofilament wire test within normal limits  bilaterally. Muscle power within normal limits bilaterally.  Nails Thick disfigured discolored nails with subungual debris  from hallux to fifth toes right foot. No evidence of bacterial infection or drainage bilaterally.  Orthopedic .  No crepitus or effusions noted.  No bony pathology or digital deformities noted. No rearfoot motion STJ and ankle  right foot.  Skin  normotropic skin with no porokeratosis noted bilaterally.  No signs of infections or ulcers noted.     Onychomycosis  Pain in right toes  Pain in left toes  Consent was obtained for treatment procedures.   Mechanical debridement of nails 1-5  bilaterally performed with a nail nipper.  Filed with dremel without incident.    Return office visit   4 months                   Told patient to return for periodic foot care and evaluation due to potential at risk complications.   Gardiner Barefoot DPM

## 2020-10-25 ENCOUNTER — Telehealth: Payer: Self-pay | Admitting: *Deleted

## 2020-10-25 NOTE — Telephone Encounter (Signed)
Patient called to report side effects of diarrhea and sinus congestion since starting gemtesa and flomax. Concerned for side effects. He has been taking both for one week at the same time. Urinary symptoms have improved. He will take Flomax in the morning and Gemtesa in the evening for improvement in side effects. Per last visit to take [facility] for 2 weeks then let us know if his symptoms have improved. Patient voiced understanding. Will notify us if symptoms worsen.

## 2020-11-07 ENCOUNTER — Other Ambulatory Visit: Payer: Self-pay | Admitting: Physician Assistant

## 2020-11-07 DIAGNOSIS — I739 Peripheral vascular disease, unspecified: Secondary | ICD-10-CM

## 2020-11-12 ENCOUNTER — Encounter: Payer: Self-pay | Admitting: Dermatology

## 2020-11-13 NOTE — Telephone Encounter (Signed)
Discussed with patient that site should be evaluated based on symptoms. Appointment is scheduled for tomorrow at 10:30 am.

## 2020-11-14 ENCOUNTER — Other Ambulatory Visit: Payer: Self-pay

## 2020-11-14 ENCOUNTER — Ambulatory Visit (INDEPENDENT_AMBULATORY_CARE_PROVIDER_SITE_OTHER): Payer: Medicare Other | Admitting: Dermatology

## 2020-11-14 DIAGNOSIS — L986 Other infiltrative disorders of the skin and subcutaneous tissue: Secondary | ICD-10-CM

## 2020-11-14 DIAGNOSIS — I25810 Atherosclerosis of coronary artery bypass graft(s) without angina pectoris: Secondary | ICD-10-CM

## 2020-11-14 DIAGNOSIS — C4492 Squamous cell carcinoma of skin, unspecified: Secondary | ICD-10-CM

## 2020-11-14 DIAGNOSIS — D485 Neoplasm of uncertain behavior of skin: Secondary | ICD-10-CM

## 2020-11-14 DIAGNOSIS — C4442 Squamous cell carcinoma of skin of scalp and neck: Secondary | ICD-10-CM

## 2020-11-14 DIAGNOSIS — L578 Other skin changes due to chronic exposure to nonionizing radiation: Secondary | ICD-10-CM | POA: Diagnosis not present

## 2020-11-14 HISTORY — DX: Squamous cell carcinoma of skin, unspecified: C44.92

## 2020-11-14 NOTE — Patient Instructions (Addendum)
Wound Care Instructions  Cleanse wound gently with soap and water once a day then pat dry with clean gauze. Apply a thing coat of Petrolatum (petroleum jelly, "Vaseline") over the wound (unless you have an allergy to this). We recommend that you use a new, sterile tube of Vaseline. Do not pick or remove scabs. Do not remove the yellow or white "healing tissue" from the base of the wound.  Cover the wound with fresh, clean, nonstick gauze and secure with paper tape. You may use Band-Aids in place of gauze and tape if the would is small enough, but would recommend trimming much of the tape off as there is often too much. Sometimes Band-Aids can irritate the skin.  You should call the office for your biopsy report after 1 week if you have not already been contacted.  If you experience any problems, such as abnormal amounts of bleeding, swelling, significant bruising, significant pain, or evidence of infection, please call the office immediately.  FOR ADULT SURGERY PATIENTS: If you need something for pain relief you may take 1 extra strength Tylenol (acetaminophen) AND 2 Ibuprofen (200mg each) together every 4 hours as needed for pain. (do not take these if you are allergic to them or if you have a reason you should not take them.) Typically, you may only need pain medication for 1 to 3 days.   If you have any questions or concerns for your doctor, please call our main line at [phone] and press option 4 to reach your doctor's medical assistant. If no one answers, please leave a voicemail as directed and we will return your call as soon as possible. Messages left after 4 pm will be answered the following business day.   You may also send us a message via MyChart. We typically respond to MyChart messages within 1-2 business days.  For prescription refills, please ask your pharmacy to contact our office. Our fax number is [phone].  If you have an urgent issue when the clinic is closed that  cannot wait until the next business day, you can page your doctor at the number below.    Please note that while we do our best to be available for urgent issues outside of office hours, we are not available 24/7.   If you have an urgent issue and are unable to reach us, you may choose to seek medical care at your doctor's office, retail clinic, urgent care center, or emergency room.  If you have a medical emergency, please immediately call 911 or go to the emergency department.  Pager Numbers  - Dr. Kowalski: [phone]  - Dr. Moye: [phone]  - Dr. Stewart: [phone]  In the event of inclement weather, please call our main line at [phone] for an update on the status of any delays or closures.  Dermatology Medication Tips: Please keep the boxes that topical medications come in in order to help keep track of the instructions about where and how to use these. Pharmacies typically print the medication instructions only on the boxes and not directly on the medication tubes.   If your medication is too expensive, please contact our office at [phone] option 4 or send us a message through MyChart.   We are unable to tell what your co-pay for medications will be in advance as this is different depending on your insurance coverage. However, we may be able to find a substitute medication at lower cost or fill out paperwork to get insurance to cover a needed   medication.   If a prior authorization is required to get your medication covered by your insurance company, please allow us 1-2 business days to complete this process.  Drug prices often vary depending on where the prescription is filled and some pharmacies may offer cheaper prices.  The website www.goodrx.com contains coupons for medications through different pharmacies. The prices here do not account for what the cost may be with help from insurance (it may be cheaper with your insurance), but the website can give you the  price if you did not use any insurance.  - You can print the associated coupon and take it with your prescription to the pharmacy.  - You may also stop by our office during regular business hours and pick up a GoodRx coupon card.  - If you need your prescription sent electronically to a different pharmacy, notify our office through [facility] MyChart or by phone at [phone] option 4.   

## 2020-11-14 NOTE — Progress Notes (Signed)
   Follow-Up Visit   Subjective  Cory [name] is a 80 y.o. male who presents for the following: recheck SCC site (Previously tx with ED&C at the same time of biopsy on 10/05/20 but patient states that wound hasn't healed, is tender to the touch, and oozes. ). 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  He was advised to return if any evidence of regrowth.  The following portions of the chart were reviewed this encounter and updated as appropriate:   Tobacco  Allergies  Meds  Problems  Med Hx  Surg Hx  Fam Hx     Review of Systems:  No other skin or systemic complaints except as noted in HPI or Assessment and Plan.  Objective  Well appearing patient in no apparent distress; mood and affect are within normal limits.  A focused examination was performed including the scalp. Relevant physical exam findings are noted in the Assessment and Plan.  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  Skin / nail biopsy Type of biopsy: tangential   Informed consent: discussed and consent obtained   Timeout: patient name, date of birth, surgical site, and procedure verified   Procedure prep:  Patient was prepped and draped in usual sterile fashion Prep type:  Isopropyl alcohol Anesthesia: the lesion was anesthetized in a standard fashion   Anesthetic:  1% lidocaine w/ epinephrine 1-100,000 buffered w/ 8.4% NaHCO3 Instrument used: flexible razor blade   Hemostasis achieved with: pressure, aluminum chloride and electrodesiccation   Outcome: patient tolerated procedure well   Post-procedure details: sterile dressing applied and wound care instructions given   Dressing type: bandage and petrolatum    Specimen 1 -  Surgical pathology Differential Diagnosis: D48.5 r/o recurrent SCC Check Margins: No 3.5 x 3.2 cm Ulcerated red plaque [id number]  S/P ED&C on 10/05/2020 - R/O recurrent SCC   Recommend MOHS if positive for squamous cell carcinoma as suspected patient prefers [place] location.   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 Will treat these once squamous cell carcinoma definitively treated. - chronic, secondary to cumulative UV radiation exposure/sun exposure over time - diffuse scaly erythematous macules with underlying dyspigmentation - Recommend daily broad spectrum sunscreen SPF 30+ to sun-exposed areas, reapply every 2 hours as needed.  - Recommend staying in the shade or wearing long sleeves, sun glasses (UVA+UVB protection) and wide brim hats (4-inch brim around the entire circumference of the hat). - Call for new or changing lesions.   Return for appointment as scheduled.  Luther Redo, CMA, am acting as scribe for Sarina Ser, MD . Documentation: I have reviewed the above documentation for accuracy and completeness, and I agree with the above.  Sarina Ser, MD

## 2020-11-15 ENCOUNTER — Encounter: Payer: Self-pay | Admitting: Urology

## 2020-11-16 ENCOUNTER — Encounter: Payer: Self-pay | Admitting: Dermatology

## 2020-11-16 MED ORDER — VIBEGRON 75 MG PO TABS
75.0000 mg | ORAL_TABLET | Freq: Every day | ORAL | 0 refills | Status: DC
Start: 2020-11-16 — End: 2021-02-26

## 2020-11-16 MED ORDER — TAMSULOSIN HCL 0.4 MG PO CAPS: 0.4000 mg | ORAL_CAPSULE | Freq: Every day | ORAL | 3 refills | Status: DC

## 2020-11-16 NOTE — Telephone Encounter (Signed)
Spoke with patient, reviewed concerns and recommendations per Dr. Erlene Quan. Sent in Flomax and [place] to [facility] mail order pharmacy as requested for 90 days. Has been having improvement with Gemtesa and would like to try for 90 days. Sent in-patient aware.

## 2020-11-17 ENCOUNTER — Ambulatory Visit (INDEPENDENT_AMBULATORY_CARE_PROVIDER_SITE_OTHER): Payer: Medicare Other

## 2020-11-17 ENCOUNTER — Other Ambulatory Visit: Payer: Self-pay

## 2020-11-17 ENCOUNTER — Encounter: Payer: Self-pay | Admitting: Dermatology

## 2020-11-17 DIAGNOSIS — I739 Peripheral vascular disease, unspecified: Secondary | ICD-10-CM

## 2020-11-21 ENCOUNTER — Other Ambulatory Visit: Payer: Self-pay | Admitting: *Deleted

## 2020-11-21 DIAGNOSIS — I739 Peripheral vascular disease, unspecified: Secondary | ICD-10-CM

## 2020-11-27 ENCOUNTER — Encounter: Payer: Self-pay | Admitting: Family Medicine

## 2020-11-30 ENCOUNTER — Telehealth: Payer: Self-pay

## 2020-11-30 DIAGNOSIS — C4442 Squamous cell carcinoma of skin of scalp and neck: Secondary | ICD-10-CM

## 2020-11-30 NOTE — Telephone Encounter (Signed)
-----   Message from Ralene Bathe, MD sent at 11/29/2020  9:41 AM EDT ----- Diagnosis Skin , L scalp MODERATELY DIFFERENTIATED SQUAMOUS CELL CARCINOMA WITH FLORID LYMPHOCYTIC INFILTRATE COMPATIBLE WITH RECURRENT LESION, SEE DESCRIPTION  Recurrent SCC  Moderately differentiated (original lesion was POORLY differentiated) Schedule for MOHS asap - Pt prefers [place], [place] for MOHS)

## 2020-11-30 NOTE — Telephone Encounter (Signed)
Advised pt of bx results.  Advised pt we would send referral to [facility] for [facility].  Advised pt [facility] would call him with appt and if he had not heard from them in a week to call our office and let us know./sh

## 2020-12-08 ENCOUNTER — Other Ambulatory Visit: Payer: Self-pay

## 2020-12-08 ENCOUNTER — Encounter: Payer: Self-pay | Admitting: Cardiovascular Disease

## 2020-12-08 ENCOUNTER — Ambulatory Visit (INDEPENDENT_AMBULATORY_CARE_PROVIDER_SITE_OTHER): Payer: Medicare Other | Admitting: Cardiovascular Disease

## 2020-12-08 VITALS — BP 140/64 | HR 71 | Ht 65.0 in | Wt 167.0 lb

## 2020-12-08 DIAGNOSIS — I779 Disorder of arteries and arterioles, unspecified: Secondary | ICD-10-CM

## 2020-12-08 DIAGNOSIS — I10 Essential (primary) hypertension: Secondary | ICD-10-CM

## 2020-12-08 DIAGNOSIS — I2510 Atherosclerotic heart disease of native coronary artery without angina pectoris: Secondary | ICD-10-CM

## 2020-12-08 DIAGNOSIS — I739 Peripheral vascular disease, unspecified: Secondary | ICD-10-CM

## 2020-12-08 DIAGNOSIS — I25810 Atherosclerosis of coronary artery bypass graft(s) without angina pectoris: Secondary | ICD-10-CM | POA: Diagnosis not present

## 2020-12-08 DIAGNOSIS — E785 Hyperlipidemia, unspecified: Secondary | ICD-10-CM

## 2020-12-08 NOTE — Progress Notes (Signed)
Cardiology Office Note   Date:  12/08/2020   ID:  Cory [name], DOB 07-14-1940, MRN [id number]  PCP:  [name], MD  Cardiologist:  Dr. Martinique but transferring to [facility] office.  Chief Complaint  Patient presents with   Other    C/o edema ankles/legs discuss vascular study. Meds reviewed verbally with pt.      History of Present Illness: Cory [name] is a 80 y.o. male who presents for was referred by Cory [name] for evaluation management of peripheral arterial disease. He has known history of coronary artery disease status post CABG, history of CVA, essential hypertension, hyperlipidemia, type 2 diabetes and history of vasovagal syncope.  His CABG was in 2000.  He had PCI of SVG to OM in [id number] complicated by aphasia and confusion but negative stroke work-up. He has history of thoracic aortic aneurysm measuring 4.6 cm The patient moved from [facility] to be closer to his family.  He was hospitalized at [facility] in January of this year with expressive aphasia.  CT was negative.  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  Carotid Doppler showed 60% right carotid stenosis.  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 He reports prolonged history of left calf claudication that started about 3 or 4 years ago with gradual worsening since then.  The Discomfort started after walking half a block and sometimes he has to stop and rest before he can resume.  He has no rest pain or lower extremity ulceration.  He underwent vascular studies in July which showed an ABI of 1.04 on the right and 0.71 on the left.  Duplex showed significant stenosis in the left popliteal artery with scant flow in the proximal SFA.  Subtotal occlusion could not be ruled out.  He denies chest pain or worsening dyspnea.  Past Medical History:  Diagnosis Date   CAD (coronary artery disease)    GERD (gastroesophageal reflux  disease)    Heart murmur    Hyperlipidemia    Hypertension    SCC (squamous cell carcinoma) 11/14/2020   Recurrent SCC L scalp, schedule for MOHs   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   Left scalp - [facility]   Stroke ([place])    Vasovagal syncope     Past Surgical History:  Procedure Laterality Date   BACK SURGERY     CHOLECYSTECTOMY     CORONARY ARTERY BYPASS GRAFT  2000   NECK SURGERY       Current Outpatient Medications  Medication Sig Dispense Refill   acetaminophen (TYLENOL) 500 MG tablet Take 1,000 mg by mouth at bedtime.     amLODipine (NORVASC) 5 MG tablet Take 5 mg by mouth daily.     aspirin EC 81 MG tablet Take 1 tablet (81 mg total) by mouth daily. Swallow whole. 90 tablet 3   atorvastatin (LIPITOR) 40 MG tablet Take 1 tablet (40 mg total) by mouth daily. 90 tablet 3   cloNIDine (CATAPRES) 0.1 MG tablet Take 1 tablet (0.1 mg total) by mouth 2 (two) times daily. 180 tablet 3   clopidogrel (PLAVIX) 75 MG tablet Take 1 tablet (75 mg total) by mouth daily. 90 tablet 3   cycloSPORINE (RESTASIS) 0.05 % ophthalmic emulsion 1 drop 2 (two) times a day.     diclofenac Sodium (VOLTAREN) 1 % GEL Apply 2 g topically as needed.     escitalopram (LEXAPRO) 5 MG  tablet Take 5 mg by mouth daily.     famotidine (PEPCID) 20 MG tablet Take 20 mg by mouth at bedtime.     fluticasone (FLONASE) 50 MCG/ACT nasal spray 1 spray by Each Nare route nightly.     latanoprost (XALATAN) 0.005 % ophthalmic solution daily.     lisinopril (ZESTRIL) 10 MG tablet Take 1 tablet (10 mg total) by mouth daily. 90 tablet 3   melatonin 5 MG TABS Take 5 mg by mouth at bedtime.     metFORMIN (GLUCOPHAGE) 500 MG tablet Take 1 tablet (500 mg total) by mouth daily with breakfast. 90 tablet 3   montelukast (SINGULAIR) 10 MG tablet Take 1 tablet (10 mg total) by mouth at bedtime. 90 tablet 3   Multiple Vitamins-Minerals (PX COMPLETE SENIOR MULTIVITS) TABS Take by mouth daily.     nitroGLYCERIN (NITROSTAT) 0.4 MG SL  tablet Place 1 tablet (0.4 mg total) under the tongue every 5 (five) minutes as needed. 5 tablet 0   pantoprazole (PROTONIX) 40 MG tablet Take 1 tablet (40 mg total) by mouth 2 (two) times daily. (Patient taking differently: Take 40 mg by mouth daily.) 180 tablet 3   tamsulosin (FLOMAX) 0.4 MG CAPS capsule Take 1 capsule (0.4 mg total) by mouth daily. 90 capsule 3   Vibegron 75 MG TABS Take 75 mg by mouth daily at 6 (six) AM. 90 tablet 0   No current facility-administered medications for this visit.    Allergies:   Fesoterodine, Isosorbide, Tramadol, Grass pollen([id number] vern), Hydrocodone, Meperidine, and Penicillins    Social History:  The patient  reports that he has quit smoking. His smoking use included pipe. He has never used smokeless tobacco. He reports current drug use. He reports that he does not drink alcohol.   Family History:  The patient's family history includes Heart Problems in his father and mother; Heart attack in his father; Heart disease in his brother and mother.    ROS:  Please see the history of present illness.   Otherwise, review of systems are positive for none.   All other systems are reviewed and negative.    PHYSICAL EXAM: VS:  BP 140/64 (BP Location: Right Arm, Patient Position: Sitting, Cuff Size: Normal)   Pulse 71   Ht '5\' 5"'$  (1.651 m)   Wt 167 lb (75.8 kg)   SpO2 98%   BMI 27.79 kg/m  , BMI Body mass index is 27.79 kg/m. GEN: Well nourished, well developed, in no acute distress  HEENT: normal  Neck: no JVD, carotid bruits, or masses Cardiac: RRR; no rubs, or gallops,no edema .  2 out of 6 systolic murmur in the aortic area Respiratory:  clear to auscultation bilaterally, normal work of breathing GI: soft, nontender, nondistended, + BS MS: no deformity or atrophy  Skin: warm and dry, no rash Neuro:  Strength and sensation are intact Psych: euthymic mood, full affect Vascular: Femoral pulses normal bilaterally.  Distal pulses are not palpable  on the left side.  EKG:  EKG is ordered today. The ekg ordered today demonstrates sinus rhythm with first-degree heart block and old inferior infarct.   Recent Labs: 03/13/2020: TSH 1.860 05/06/2020: ALT 22; Hemoglobin 11.8; Platelets 226 05/07/2020: BUN 9; Creatinine, Ser 0.71; Magnesium 2.0; Potassium 4.0; Sodium 135    Lipid Panel    Component Value Date/Time   CHOL 116 05/07/2020 0512   CHOL 137 03/13/2020 1123   TRIG 101 05/07/2020 0512   HDL 40 (L) 05/07/2020 [id number]  HDL 47 03/13/2020 1123   CHOLHDL 2.9 05/07/2020 0512   VLDL 20 05/07/2020 0512   LDLCALC 56 05/07/2020 0512   LDLCALC 65 03/13/2020 1123      Wt Readings from Last 3 Encounters:  12/08/20 167 lb (75.8 kg)  10/10/20 156 lb 8 oz (71 kg)  09/26/20 168 lb (76.2 kg)       PAD Screen 12/08/2020  Previous PAD dx? No  Previous surgical procedure? Yes  Dates of procedures Hx GABG/Cardiac cath  Pain with walking? Yes  Subsides with rest? Yes  Feet/toe relief with dangling? No  Painful, non-healing ulcers? No  Extremities discolored? No      ASSESSMENT AND PLAN:  1.  Peripheral arterial disease: The patient has severe left calf claudication which seems to be due to SFA/popliteal disease.  I discussed with him the natural history and management of claudication.  Recommend aggressive treatment of risk factors.  I discussed with him the importance of starting an exercise program and he is going to do that.  Evaluate symptoms and 3 months and if there is no improvement, we can consider proceeding with angiography and possible endovascular intervention.  There is no evidence of critical limb ischemia.  2.  Moderate calcified right carotid stenosis: I requested a follow-up carotid Doppler to be done in 3 months from now and will follow this in our office.  Although the patient had TIA and stroke before, these were felt to be due to small vessel disease.  3.  Coronary artery disease involving native coronary artery  status post CABG: Currently with no anginal symptoms.  Previous PCI and SVG to OM.  Currently on dual antiplatelet therapy for both his coronary artery disease as well as recurrent strokes.  4.  Essential hypertension: Blood pressure is reasonably controlled on current medications.  5.  Hyperlipidemia: I reviewed most recent lipid profile which showed an LDL of 56.  Thus, I recommend continuing current dose of atorvastatin 40 mg daily.    Disposition:   FU with me in 3 months  Signed,  Kathlyn Sacramento, MD  12/08/2020 10:04 AM    [place]

## 2020-12-08 NOTE — Patient Instructions (Signed)
Medication Instructions:  Your physician recommends that you continue on your current medications as directed. Please refer to the Current Medication list given to you today.  *If you need a refill on your cardiac medications before your next appointment, please call your pharmacy*   Lab Work: None ordered  If you have labs (blood work) drawn today and your tests are completely normal, you will receive your results only by: [place] (if you have MyChart) OR A paper copy in the mail If you have any lab test that is abnormal or we need to change your treatment, we will call you to review the results.   Testing/Procedures: Your physician has requested that you have a carotid duplex. This test is an ultrasound of the carotid arteries in your neck. It looks at blood flow through these arteries that supply the brain with blood. Allow one hour for this exam. There are no restrictions or special instructions. (To be scheduled in 3 months)   Follow-Up: At [facility], you and your health needs are our priority.  As part of our continuing mission to provide you with exceptional heart care, we have created designated Provider Care Teams.  These Care Teams include your primary Cardiologist (physician) and Advanced Practice Providers (APPs -  Physician Assistants and Nurse Practitioners) who all work together to provide you with the care you need, when you need it.  We recommend signing up for the patient portal called "MyChart".  Sign up information is provided on this After Visit Summary.  MyChart is used to connect with patients for Virtual Visits (Telemedicine).  Patients are able to view lab/test results, encounter notes, upcoming appointments, etc.  Non-urgent messages can be sent to your provider as well.   To learn more about what you can do with MyChart, go to [email or web address].    Your next appointment:   3 month(s)  The format for your next appointment:   In  Person  Provider:   You may see Kathlyn Sacramento, MD or one of the following Advanced Practice Providers on your designated Care Team:   Murray Hodgkins, NP Christell Faith, PA-C Marrianne Mood, PA-C Cadence Kathlen Mody, [place]   Other Instructions N/A

## 2020-12-11 NOTE — Addendum Note (Signed)
Addended by: Britt Bottom on: 12/11/2020 03:04 PM   Modules accepted: Orders

## 2020-12-18 NOTE — Progress Notes (Signed)
[facility] [place]  ASSESSMENT / PLAN: Cory [name] is a 80 y.o. 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  Patient counseled patients with asymptomatic peripheral arterial disease or claudication have a 1-2% risk of developing chronic limb threatening ischemia, but a 15-30% risk of mortality in the next 5 years. Intervention should only be considered for medically optimized patients with disabling symptoms. .  Recommend the following which can slow the progression of atherosclerosis and reduce the risk of major adverse cardiac / limb events:  Complete cessation from all tobacco products. Blood glucose control with goal A1c < 7%. Blood pressure control with goal blood pressure < 140/90 mmHg. Lipid reduction therapy with goal LDL-C <100 mg/dL (<70 if symptomatic from PAD).  Aspirin '81mg'$  PO QD.  Atorvastatin 40-'80mg'$  PO QD (or other "high intensity" statin therapy). Daily walking to and past the point of discomfort. Patient counseled to keep a log of exercise distance. Adequate hydration (at least 2 liters / day) if patient's heart and kidney function is adequate.  Patient lives in [place] and would like follow-up closer to home.  Dr. Fletcher Anon has seen the patient recently.  I encouraged him to continue his follow-up with Dr. Fletcher Anon. I can see him on a PRN basis.  CHIEF COMPLAINT: leg cramping with walking  HISTORY OF PRESENT ILLNESS: Cory [name] is a 80 y.o. male who presents to clinic with a fairly classic history of intermittent claudication.  The patient reports he gets cramping in his bilateral calves, left greater than right, after walking about 100 yards.  After resting the pain resolves.  He has no symptoms typical of ischemic rest pain in either leg.  He has no nonhealing ulcers.  He does report swelling in his bilateral lower extremities.  Swelling is improved by compression and  elevation.  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   Left scalp - EDC   Stroke ([place])    Vasovagal syncope     Past Surgical History:  Procedure Laterality Date   BACK SURGERY     CHOLECYSTECTOMY     CORONARY ARTERY BYPASS GRAFT  2000   NECK SURGERY      Family History  Problem Relation Age of Onset   Heart Problems Mother    Heart disease Mother    Heart Problems Father    Heart attack Father    Heart disease Brother     Social History   Socioeconomic History   Marital status: Married    Spouse name: Not on file   Number of children: Not on file   Years of education: Not on file   Highest education level: Not on file  Occupational History   Occupation: retired  Tobacco Use   Smoking status: Former    Types: Pipe   Smokeless tobacco: Never  Scientific laboratory technician Use: Never used  Substance and Sexual Activity   Alcohol use: Never   Drug use: Yes   Sexual activity: Yes  Other Topics Concern   Not on file  Social History Narrative   Not on file   Social Determinants of Radio broadcast assistant  Strain: Not on file  Food Insecurity: Not on file  Transportation Needs: Not on file  Physical Activity: Not on file  Stress: Not on file  Social Connections: Not on file  Intimate Partner Violence: Not on file    Allergies  Allergen Reactions   Fesoterodine Other (See Comments)    Dry mouth and constipation   Isosorbide Other (See Comments)   Tramadol Other (See Comments)   Grass Pollen(K-O-R-T-Swt Vern) Other (See Comments)   Hydrocodone Anxiety   Meperidine Other (See Comments) and Rash    Other reaction(s): Other (See Comments) HALLUCINATIONS    Penicillins Rash    Current Outpatient  Medications  Medication Sig Dispense Refill   acetaminophen (TYLENOL) 500 MG tablet Take 1,000 mg by mouth at bedtime.     amLODipine (NORVASC) 5 MG tablet Take 5 mg by mouth daily.     aspirin EC 81 MG tablet Take 1 tablet (81 mg total) by mouth daily. Swallow whole. 90 tablet 3   atorvastatin (LIPITOR) 40 MG tablet Take 1 tablet (40 mg total) by mouth daily. 90 tablet 3   cloNIDine (CATAPRES) 0.1 MG tablet Take 1 tablet (0.1 mg total) by mouth 2 (two) times daily. 180 tablet 3   clopidogrel (PLAVIX) 75 MG tablet Take 1 tablet (75 mg total) by mouth daily. 90 tablet 3   cycloSPORINE (RESTASIS) 0.05 % ophthalmic emulsion 1 drop 2 (two) times a day.     diclofenac Sodium (VOLTAREN) 1 % GEL Apply 2 g topically as needed.     escitalopram (LEXAPRO) 5 MG tablet Take 5 mg by mouth daily.     famotidine (PEPCID) 20 MG tablet Take 20 mg by mouth at bedtime.     fluticasone (FLONASE) 50 MCG/ACT nasal spray 1 spray by Each Nare route nightly.     latanoprost (XALATAN) 0.005 % ophthalmic solution daily.     lisinopril (ZESTRIL) 10 MG tablet Take 1 tablet (10 mg total) by mouth daily. 90 tablet 3   melatonin 5 MG TABS Take 5 mg by mouth at bedtime.     metFORMIN (GLUCOPHAGE) 500 MG tablet Take 1 tablet (500 mg total) by mouth daily with breakfast. 90 tablet 3   montelukast (SINGULAIR) 10 MG tablet Take 1 tablet (10 mg total) by mouth at bedtime. 90 tablet 3   Multiple Vitamins-Minerals (PX COMPLETE SENIOR MULTIVITS) TABS Take by mouth daily.     nitroGLYCERIN (NITROSTAT) 0.4 MG SL tablet Place 1 tablet (0.4 mg total) under the tongue every 5 (five) minutes as needed. 5 tablet 0   pantoprazole (PROTONIX) 40 MG tablet Take 1 tablet (40 mg total) by mouth 2 (two) times daily. (Patient taking differently: Take 40 mg by mouth daily.) 180 tablet 3   tamsulosin (FLOMAX) 0.4 MG CAPS capsule Take 1 capsule (0.4 mg total) by mouth daily. 90 capsule 3   Vibegron 75 MG TABS Take 75 mg by mouth daily at 6 (six) AM. 90  tablet 0   No current facility-administered medications for this visit.    REVIEW OF SYSTEMS:  '[X]'$  denotes positive finding, '[ ]'$  denotes negative finding Cardiac  Comments:  Chest pain or chest pressure:    Shortness of breath upon exertion:    Short of breath when lying flat:    Irregular heart rhythm:        Vascular    Pain in calf, thigh, or hip brought on by ambulation:    Pain in feet at night that wakes you up from  your sleep:     Blood clot in your veins:    Leg swelling:         Pulmonary    Oxygen at home:    Productive cough:     Wheezing:         Neurologic    Sudden weakness in arms or legs:     Sudden numbness in arms or legs:     Sudden onset of difficulty speaking or slurred speech:    Temporary loss of vision in one eye:     Problems with dizziness:         Gastrointestinal    Blood in stool:     Vomited blood:         Genitourinary    Burning when urinating:     Blood in urine:        Psychiatric    Major depression:         Hematologic    Bleeding problems:    Problems with blood clotting too easily:        Skin    Rashes or ulcers:        Constitutional    Fever or chills:      PHYSICAL EXAM Vitals:   12/19/20 1024  BP: 110/62  Pulse: 61  Resp: 20  Temp: 98.1 F (36.7 C)  SpO2: 98%  Weight: 167 lb (75.8 kg)  Height: '5\' 5"'$  (1.651 m)    Constitutional: well appearing. no distress. Appears well nourished.  Neurologic: CN intact. no focal findings. no sensory loss. Psychiatric:  Mood and affect symmetric and appropriate. Eyes:  No icterus. No conjunctival pallor. Ears, nose, throat:  mucous membranes moist. Midline trachea.  Cardiac: regular rate and rhythm.  Respiratory:  unlabored. Abdominal: non-distended.  Peripheral vascular: No palpable pedal pulses.  Reticular veins about ankles bilaterally. Extremity: 1-2+ edema feet to knees bilaterally. no cyanosis. no pallor.  Skin: no gangrene. no ulceration.  Lymphatic: no  Stemmer's sign. no palpable lymphadenopathy.  PERTINENT LABORATORY AND RADIOLOGIC DATA  Most recent CBC CBC Latest Ref Rng & Units 05/06/2020 03/13/2020  WBC 4.0 - 10.5 K/uL 6.6 4.6  Hemoglobin 13.0 - 17.0 g/dL 11.8(L) 12.1(L)  Hematocrit 39.0 - 52.0 % 35.4(L) 37.0(L)  Platelets 150 - 400 K/uL 226 216     Most recent CMP CMP Latest Ref Rng & Units 05/07/2020 05/06/2020 03/13/2020  Glucose 70 - 99 mg/dL 99 139(H) 108(H)  BUN 8 - 23 mg/dL '9 11 10  '$ Creatinine 0.61 - 1.24 mg/dL 0.71 0.92 0.80  Sodium 135 - 145 mmol/L 135 130(L) 134  Potassium 3.5 - 5.1 mmol/L 4.0 4.1 5.0  Chloride 98 - 111 mmol/L 102 96(L) 98  CO2 22 - 32 mmol/L '24 22 21  '$ Calcium 8.9 - 10.3 mg/dL 9.0 9.2 9.4  Total Protein 6.5 - 8.1 g/dL - 7.2 6.4  Total Bilirubin 0.3 - 1.2 mg/dL - 0.7 0.2  Alkaline Phos 38 - 126 U/L - 49 55  AST 15 - 41 U/L - 26 25  ALT 0 - 44 U/L - 22 20    Renal function CrCl cannot be calculated (Patient's most recent lab result is older than the maximum 21 days allowed.).  Hemoglobin A1C (%)  Date Value  10/10/2020 6.4 (A)   Hgb A1c MFr Bld (%)  Date Value  05/07/2020 6.0 (H)    LDL Chol Calc (NIH)  Date Value Ref Range Status  03/13/2020 65 0 - 99 mg/dL Final   LDL  Cholesterol  Date Value Ref Range Status  05/07/2020 56 0 - 99 mg/dL Final    Comment:           Total Cholesterol/HDL:CHD Risk Coronary Heart Disease Risk Table                     Men   Women  1/2 Average Risk   3.4   3.3  Average Risk       5.0   4.4  2 X Average Risk   9.6   7.1  3 X Average Risk  23.4   11.0        Use the calculated Patient Ratio above and the CHD Risk Table to determine the patient's CHD Risk.        ATP [name] CLASSIFICATION (LDL):  <100     mg/dL   Optimal  100-129  mg/dL   Near or Above                    Optimal  130-159  mg/dL   Borderline  160-189  mg/dL   High  >190     mg/dL   Very High Performed at [facility], [address] [place], [place] [address]      [place] STUDY   Patient Name:  Cory [name]  Date of Exam:   11/17/2020  Medical Rec #: [id number]           Accession #:    [id number]  Date of Birth: 08-11-1940          Patient Gender: M  Patient Age:   30Y  Exam Location:  [facility]  Procedure:      VAS [place] LOWER EXTREMITY ARTERIAL DUPLEX  Referring Phys: [id number] HAO MENG    ---------------------------------------------------------------------------  -----     Indications: Claudication, and Left calf pain when walking 100-200 yds.  Resolves               with rest, after several minutes. Symptom has been present  for               years but is progressive.   High Risk Factors: Hypertension, hyperlipidemia, Diabetes, past history of                     smoking, coronary artery disease.   Other Factors: Suboptimal iliac images due to abundant bowel gas.   Vascular Interventions: Complicated medical history including a CVA in  2015,                          right hip replacement and tearing of his bilateral  quad                          tendons on 2017.  Current ABI:            1.04 right and 0.71 left   Comparison Study: None   Performing Technologist: Pilar Jarvis RDMS, RVT, RDCS      Examination Guidelines: A complete evaluation includes B-mode imaging,  spectral  Doppler, color Doppler, and power Doppler as needed of all accessible  portions  of each vessel. Bilateral testing is considered an integral part of a  complete  examination. Limited examinations for reoccurring indications may be  performed  as noted.        +----------+--------+-----+--------+---------+--------+  RIGHT  PSV cm/sRatioStenosisWaveform Comments  +----------+--------+-----+--------+---------+--------+  CIA Prox  95                                      +----------+--------+-----+--------+---------+--------+  CIA Distal96                                       +----------+--------+-----+--------+---------+--------+  EIA Prox  108                  triphasic          +----------+--------+-----+--------+---------+--------+  EIA Mid   95                   triphasic          +----------+--------+-----+--------+---------+--------+  EIA Distal92                   biphasic           +----------+--------+-----+--------+---------+--------+  CFA Prox  79                   biphasic           +----------+--------+-----+--------+---------+--------+  CFA Distal56                   triphasic          +----------+--------+-----+--------+---------+--------+  DFA       50                   biphasic           +----------+--------+-----+--------+---------+--------+  SFA Prox  49                   biphasic           +----------+--------+-----+--------+---------+--------+  SFA Mid   68                   triphasic          +----------+--------+-----+--------+---------+--------+  SFA Distal87                   triphasic          +----------+--------+-----+--------+---------+--------+  POP Prox  54                   biphasic           +----------+--------+-----+--------+---------+--------+  POP Mid   52                   triphasic          +----------+--------+-----+--------+---------+--------+  POP Distal51                   biphasic           +----------+--------+-----+--------+---------+--------+  TP Trunk  120                  biphasic           +----------+--------+-----+--------+---------+--------+  ATA Mid   47                                      +----------+--------+-----+--------+---------+--------+  PTA Mid   75                                      +----------+--------+-----+--------+---------+--------+  Peroneal artery not seen.       +----------+--------+-----+--------+----------+----------------------+  LEFT      PSV  cm/sRatioStenosisWaveform  Comments                +----------+--------+-----+--------+----------+----------------------+  CIA Prox  143                                                    +----------+--------+-----+--------+----------+----------------------+  CIA Mid   78                                                     +----------+--------+-----+--------+----------+----------------------+  EIA Mid   54                   biphasic                          +----------+--------+-----+--------+----------+----------------------+  EIA Distal66                   biphasic                          +----------+--------+-----+--------+----------+----------------------+  CFA Prox  68                   biphasic  rounded waveform        +----------+--------+-----+--------+----------+----------------------+  CFA Distal63                   biphasic  rounded waveform        +----------+--------+-----+--------+----------+----------------------+  DFA       94                   biphasic                          +----------+--------+-----+--------+----------+----------------------+  SFA Prox  34                   monophasicblunted and scant flow  +----------+--------+-----+--------+----------+----------------------+  SFA Mid   28                   monophasic                        +----------+--------+-----+--------+----------+----------------------+  SFA Distal15                   monophasicblunted waveform        +----------+--------+-----+--------+----------+----------------------+  POP Prox  111                  monophasic                        +----------+--------+-----+--------+----------+----------------------+  POP Mid   24                   monophasicblunted                 +----------+--------+-----+--------+----------+----------------------+  POP Distal296                  monophasic                         +----------+--------+-----+--------+----------+----------------------+  TP Trunk  187                  monophasicscant flow              +----------+--------+-----+--------+----------+----------------------+  ATA Mid   21                   monophasic                        +----------+--------+-----+--------+----------+----------------------+  PTA Mid   34                   monophasic                        +----------+--------+-----+--------+----------+----------------------+   A focal velocity elevation of 296 cm/s was obtained at distal popliteal  with a VR of 12.3. 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  Peroneal artery not seen.          Summary:  Right: Near normal examination.   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 Scant flow in prox  SFA. Sub-total occlusion cannot be ruled-out      See table(s) above for measurements and observations.    Vascular consult recommended.   Electronically signed by Larae Grooms MD on 11/18/2020 at 4:17:14 PM.  Yevonne Aline. Stanford Breed, MD [facility] Phone Number: [phone] 12/19/2020 11:18 AM  Total time spent on preparing this encounter including chart review, data review, collecting history, examining the patient, coordinating care for this new patient, 60 minutes.  Portions of this report may have been transcribed using voice recognition software.  Every effort has been made to ensure accuracy; however, inadvertent computerized transcription errors may still be present.

## 2020-12-19 ENCOUNTER — Encounter: Payer: Self-pay | Admitting: Vascular Surgery

## 2020-12-19 ENCOUNTER — Ambulatory Visit (INDEPENDENT_AMBULATORY_CARE_PROVIDER_SITE_OTHER): Payer: Medicare Other | Admitting: Vascular Surgery

## 2020-12-19 ENCOUNTER — Other Ambulatory Visit: Payer: Self-pay

## 2020-12-19 VITALS — BP 110/62 | HR 61 | Temp 98.1°F | Resp 20 | Ht 65.0 in | Wt 167.0 lb

## 2020-12-19 DIAGNOSIS — I739 Peripheral vascular disease, unspecified: Secondary | ICD-10-CM | POA: Diagnosis not present

## 2020-12-23 ENCOUNTER — Encounter: Payer: Self-pay | Admitting: Family Medicine

## 2020-12-27 ENCOUNTER — Encounter: Payer: Self-pay | Admitting: Plastic Surgery

## 2020-12-27 ENCOUNTER — Other Ambulatory Visit: Payer: Self-pay

## 2020-12-27 ENCOUNTER — Ambulatory Visit (INDEPENDENT_AMBULATORY_CARE_PROVIDER_SITE_OTHER): Payer: Medicare Other | Admitting: Plastic Surgery

## 2020-12-27 VITALS — BP 163/84 | HR 60 | Ht 65.0 in | Wt 165.0 lb

## 2020-12-27 DIAGNOSIS — C4442 Squamous cell carcinoma of skin of scalp and neck: Secondary | ICD-10-CM | POA: Diagnosis not present

## 2020-12-27 NOTE — Progress Notes (Signed)
Referring Provider [name], MD [place],  [place] [address]   CC:  Chief Complaint  Patient presents with   consult      Cory [name] is an 80 y.o. male.  HPI: Patient presents to discuss reconstruction of his scalp after upcoming Mohs excision.  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  Mohs excision is planned.  He is here to discuss his reconstructive options.  Allergies  Allergen Reactions   Fesoterodine Other (See Comments)    Dry mouth and constipation   Isosorbide Other (See Comments)   Tramadol Other (See Comments)   Grass Pollen(K-O-R-T-Swt Vern) Other (See Comments)   Hydrocodone Anxiety   Meperidine Other (See Comments) and Rash    Other reaction(s): Other (See Comments) HALLUCINATIONS    Penicillins Rash    Outpatient Encounter Medications as of 12/27/2020  Medication Sig   acetaminophen (TYLENOL) 500 MG tablet Take 1,000 mg by mouth at bedtime.   amLODipine (NORVASC) 5 MG tablet Take 5 mg by mouth daily.   aspirin EC 81 MG tablet Take 1 tablet (81 mg total) by mouth daily. Swallow whole.   atorvastatin (LIPITOR) 40 MG tablet Take 1 tablet (40 mg total) by mouth daily.   cloNIDine (CATAPRES) 0.1 MG tablet Take 1 tablet (0.1 mg total) by mouth 2 (two) times daily.   clopidogrel (PLAVIX) 75 MG tablet Take 1 tablet (75 mg total) by mouth daily.   cycloSPORINE (RESTASIS) 0.05 % ophthalmic emulsion 1 drop 2 (two) times a day.   diclofenac Sodium (VOLTAREN) 1 % GEL Apply 2 g topically as needed.   escitalopram (LEXAPRO) 5 MG tablet Take 5 mg by mouth daily.   famotidine (PEPCID) 20 MG tablet Take 20 mg by mouth at bedtime.   fluticasone (FLONASE) 50 MCG/ACT nasal spray 1 spray by Each Nare route nightly.   latanoprost (XALATAN) 0.005 % ophthalmic solution daily.   lisinopril (ZESTRIL) 10 MG tablet Take 1 tablet (10 mg total) by mouth daily.   melatonin 5 MG TABS Take 5 mg by mouth at bedtime.   metFORMIN (GLUCOPHAGE) 500 MG  tablet Take 1 tablet (500 mg total) by mouth daily with breakfast.   montelukast (SINGULAIR) 10 MG tablet Take 1 tablet (10 mg total) by mouth at bedtime.   Multiple Vitamins-Minerals (PX COMPLETE SENIOR MULTIVITS) TABS Take by mouth daily.   nitroGLYCERIN (NITROSTAT) 0.4 MG SL tablet Place 1 tablet (0.4 mg total) under the tongue every 5 (five) minutes as needed.   pantoprazole (PROTONIX) 40 MG tablet Take 1 tablet (40 mg total) by mouth 2 (two) times daily. (Patient taking differently: Take 40 mg by mouth daily.)   tamsulosin (FLOMAX) 0.4 MG CAPS capsule Take 1 capsule (0.4 mg total) by mouth daily.   Vibegron 75 MG TABS Take 75 mg by mouth daily at 6 (six) AM.   No facility-administered encounter medications on file as of 12/27/2020.     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   Left scalp - EDC   Stroke [facility])    Vasovagal syncope     Past Surgical History:  Procedure Laterality Date   BACK SURGERY  CHOLECYSTECTOMY     CORONARY ARTERY BYPASS GRAFT  2000   NECK SURGERY      Family History  Problem Relation Age of Onset   Heart Problems Mother    Heart disease Mother    Heart Problems Father    Heart attack Father    Heart disease Brother     Social History   Social History Narrative   Not on file     Review of Systems General: Denies fevers, chills, weight loss CV: Denies chest pain, shortness of breath, palpitations  Physical Exam Vitals with BMI 12/27/2020 12/19/2020 12/08/2020  Height '5\' 5"'$  '5\' 5"'$  -  Weight 165 lbs 167 lbs -  BMI 0000000 [id number] -  Systolic [id number]  Diastolic 84 62 64  Pulse 60 61 -    General:  No acute distress,  Alert and oriented, Non-Toxic, Normal speech and affect Examination  shows approximately 6 to 7 cm diameter ulcerative mass in the frontal scalp.  Assessment/Plan Had a long discussion with the patient about his options.  Due to the size that we will potentially be a complex reconstruction.  Explained there would be options between Integra versus adjacent tissue transfer.  Once I am able to visualize the defect I can talk more specifically about that with him.  We went over the pros and cons of the various options along with the risks include bleeding, infection, damage to surrounding structures need for additional procedures.  Cindra Presume 12/27/2020, 3:36 PM

## 2020-12-27 NOTE — H&P (View-Only) (Signed)
Referring Provider [name], MD [place],  [place] [address]   CC:  Chief Complaint  Patient presents with   consult      Cory [name] is an 80 y.o. male.  HPI: Patient presents to discuss reconstruction of his scalp after upcoming Mohs excision.  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  Mohs excision is planned.  He is here to discuss his reconstructive options.  Allergies  Allergen Reactions   Fesoterodine Other (See Comments)    Dry mouth and constipation   Isosorbide Other (See Comments)   Tramadol Other (See Comments)   Grass Pollen(K-O-R-T-Swt Vern) Other (See Comments)   Hydrocodone Anxiety   Meperidine Other (See Comments) and Rash    Other reaction(s): Other (See Comments) HALLUCINATIONS    Penicillins Rash    Outpatient Encounter Medications as of 12/27/2020  Medication Sig   acetaminophen (TYLENOL) 500 MG tablet Take 1,000 mg by mouth at bedtime.   amLODipine (NORVASC) 5 MG tablet Take 5 mg by mouth daily.   aspirin EC 81 MG tablet Take 1 tablet (81 mg total) by mouth daily. Swallow whole.   atorvastatin (LIPITOR) 40 MG tablet Take 1 tablet (40 mg total) by mouth daily.   cloNIDine (CATAPRES) 0.1 MG tablet Take 1 tablet (0.1 mg total) by mouth 2 (two) times daily.   clopidogrel (PLAVIX) 75 MG tablet Take 1 tablet (75 mg total) by mouth daily.   cycloSPORINE (RESTASIS) 0.05 % ophthalmic emulsion 1 drop 2 (two) times a day.   diclofenac Sodium (VOLTAREN) 1 % GEL Apply 2 g topically as needed.   escitalopram (LEXAPRO) 5 MG tablet Take 5 mg by mouth daily.   famotidine (PEPCID) 20 MG tablet Take 20 mg by mouth at bedtime.   fluticasone (FLONASE) 50 MCG/ACT nasal spray 1 spray by Each Nare route nightly.   latanoprost (XALATAN) 0.005 % ophthalmic solution daily.   lisinopril (ZESTRIL) 10 MG tablet Take 1 tablet (10 mg total) by mouth daily.   melatonin 5 MG TABS Take 5 mg by mouth at bedtime.   metFORMIN (GLUCOPHAGE) 500 MG  tablet Take 1 tablet (500 mg total) by mouth daily with breakfast.   montelukast (SINGULAIR) 10 MG tablet Take 1 tablet (10 mg total) by mouth at bedtime.   Multiple Vitamins-Minerals (PX COMPLETE SENIOR MULTIVITS) TABS Take by mouth daily.   nitroGLYCERIN (NITROSTAT) 0.4 MG SL tablet Place 1 tablet (0.4 mg total) under the tongue every 5 (five) minutes as needed.   pantoprazole (PROTONIX) 40 MG tablet Take 1 tablet (40 mg total) by mouth 2 (two) times daily. (Patient taking differently: Take 40 mg by mouth daily.)   tamsulosin (FLOMAX) 0.4 MG CAPS capsule Take 1 capsule (0.4 mg total) by mouth daily.   Vibegron 75 MG TABS Take 75 mg by mouth daily at 6 (six) AM.   No facility-administered encounter medications on file as of 12/27/2020.     Past Medical History:  Diagnosis Date   CAD (coronary artery disease)    GERD (gastroesophageal reflux disease)    Heart murmur    Hyperlipidemia    Hypertension    SCC (squamous cell carcinoma) 11/14/2020   Recurrent SCC L scalp, schedule for [facility]   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   Left scalp - [facility]   Stroke [facility])    Vasovagal syncope     Past Surgical History:  Procedure Laterality Date   BACK SURGERY  CHOLECYSTECTOMY     CORONARY ARTERY BYPASS GRAFT  2000   NECK SURGERY      Family History  Problem Relation Age of Onset   Heart Problems Mother    Heart disease Mother    Heart Problems Father    Heart attack Father    Heart disease Brother     Social History   Social History Narrative   Not on file     Review of Systems General: Denies fevers, chills, weight loss CV: Denies chest pain, shortness of breath, palpitations  Physical Exam Vitals with BMI 12/27/2020 12/19/2020 12/08/2020  Height '5\' 5"'$  '5\' 5"'$  -  Weight 165 lbs 167 lbs -  BMI 0000000 [id number] -  Systolic [id number]  Diastolic 84 62 64  Pulse 60 61 -    General:  No acute distress,  Alert and oriented, Non-Toxic, Normal speech and affect Examination  shows approximately 6 to 7 cm diameter ulcerative mass in the frontal scalp.  Assessment/Plan Had a long discussion with the patient about his options.  Due to the size that we will potentially be a complex reconstruction.  Explained there would be options between Integra versus adjacent tissue transfer.  Once I am able to visualize the defect I can talk more specifically about that with him.  We went over the pros and cons of the various options along with the risks include bleeding, infection, damage to surrounding structures need for additional procedures.  Cindra Presume 12/27/2020, 3:36 PM

## 2021-01-02 ENCOUNTER — Telehealth: Payer: Self-pay

## 2021-01-02 NOTE — Telephone Encounter (Signed)
   [facility] Pre-operative Risk Assessment    Patient Name: Cory [name]  DOB: 27-Mar-1941 MRN: [id number]  HEARTCARE STAFF:  - IMPORTANT!!!!!! Under Visit Info/Reason for Call, type in Other and utilize the format Clearance MM/DD/YY or Clearance TBD. Do not use dashes or single digits. - Please review there is not already an duplicate clearance open for this procedure. - If request is for dental extraction, please clarify the # of teeth to be extracted. - If the patient is currently at the dentist's office, call Pre-Op Callback Staff (MA/nurse) to input urgent request.  - If the patient is not currently in the dentist office, please route to the Pre-Op pool.  Request for surgical clearance:  What type of surgery is being performed? Scalp Reconstruction with adjacent tissue transfer.  When is this surgery scheduled? 01/23/21  What type of clearance is required (medical clearance vs. Pharmacy clearance to hold med vs. Both)? Both   Are there any medications that need to be held prior to surgery and how long? Plavix  Practice name and name of physician performing surgery? Plastic Surgery Specialist, Dr. Mingo Amber  What is the office phone number? [phone]   7.   What is the office fax number? [phone]  8.   Anesthesia type (None, local, MAC, general) ? General   Monia Pouch 01/02/2021, 3:52 PM  _________________________________________________________________   (provider comments below)

## 2021-01-03 NOTE — Telephone Encounter (Signed)
Plavix can be hold 5 to 7 days before the surgery.  Aspirin 81 mg daily should be continued without interruption.  He can proceed with surgery at an overall moderate risk.

## 2021-01-03 NOTE — Telephone Encounter (Signed)
Dr. Fletcher Anon Pt has a history of CAD s/p CABG with subsequent PCI to SVG-OM in 2019. He also has recurrent strokes. We are asked to hold plavix for scalp reconstruction. From a cardiac perspective, can he hold plavix? Do you recommend continuation of ASA?   I will also have the requesting office reach out to the PCP/neuro.   His activity level is limited by claudication and has seen for PAD. 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 Given his activity level, can you recommend clearance for surgery?

## 2021-01-09 ENCOUNTER — Encounter: Payer: Self-pay | Admitting: Family Medicine

## 2021-01-11 ENCOUNTER — Other Ambulatory Visit: Payer: Self-pay | Admitting: Family Medicine

## 2021-01-11 DIAGNOSIS — R7303 Prediabetes: Secondary | ICD-10-CM

## 2021-01-11 DIAGNOSIS — I6930 Unspecified sequelae of cerebral infarction: Secondary | ICD-10-CM

## 2021-01-15 ENCOUNTER — Telehealth: Payer: Self-pay | Admitting: Family Medicine

## 2021-01-15 NOTE — Telephone Encounter (Signed)
Received urgent request for surgical clearance from plastics for general anesthesia and scalp surgery.   Would advised appointment. Or if cleared by Cardiology - pt could call cardiology who he saw in August 2022 to see if clearance can be provided w/o appointment.

## 2021-01-16 ENCOUNTER — Encounter (HOSPITAL_BASED_OUTPATIENT_CLINIC_OR_DEPARTMENT_OTHER): Payer: Self-pay | Admitting: Plastic Surgery

## 2021-01-16 ENCOUNTER — Ambulatory Visit: Payer: Medicare Other | Admitting: Dermatology

## 2021-01-16 ENCOUNTER — Other Ambulatory Visit: Payer: Self-pay

## 2021-01-16 NOTE — Telephone Encounter (Signed)
Left VM (DPR) relaying dr. Verda Cumins message and asked pt to call cardiology to see if they will clear him w/o an appt.

## 2021-01-17 ENCOUNTER — Ambulatory Visit (INDEPENDENT_AMBULATORY_CARE_PROVIDER_SITE_OTHER): Payer: Medicare Other | Admitting: Plastic Surgery

## 2021-01-17 ENCOUNTER — Encounter (HOSPITAL_BASED_OUTPATIENT_CLINIC_OR_DEPARTMENT_OTHER)
Admission: RE | Admit: 2021-01-17 | Discharge: 2021-01-17 | Disposition: A | Discharge status: Home / Self Care | Payer: Medicare Other | Source: Ambulatory Visit | Attending: Plastic Surgery | Admitting: Plastic Surgery

## 2021-01-17 DIAGNOSIS — Z880 Allergy status to penicillin: Secondary | ICD-10-CM | POA: Diagnosis not present

## 2021-01-17 DIAGNOSIS — C4442 Squamous cell carcinoma of skin of scalp and neck: Secondary | ICD-10-CM | POA: Diagnosis present

## 2021-01-17 DIAGNOSIS — Z7984 Long term (current) use of oral hypoglycemic drugs: Secondary | ICD-10-CM | POA: Diagnosis not present

## 2021-01-17 DIAGNOSIS — Z885 Allergy status to narcotic agent status: Secondary | ICD-10-CM | POA: Diagnosis not present

## 2021-01-17 DIAGNOSIS — Z7982 Long term (current) use of aspirin: Secondary | ICD-10-CM | POA: Diagnosis not present

## 2021-01-17 DIAGNOSIS — Z888 Allergy status to other drugs, medicaments and biological substances status: Secondary | ICD-10-CM | POA: Diagnosis not present

## 2021-01-17 DIAGNOSIS — Z87891 Personal history of nicotine dependence: Secondary | ICD-10-CM | POA: Diagnosis not present

## 2021-01-17 DIAGNOSIS — Z79899 Other long term (current) drug therapy: Secondary | ICD-10-CM | POA: Diagnosis not present

## 2021-01-17 LAB — BASIC METABOLIC PANEL
Anion gap: 10 (ref 5–15)
BUN: 12 mg/dL (ref 8–23)
CO2: 22 mmol/L (ref 22–32)
Calcium: 9.1 mg/dL (ref 8.9–10.3)
Chloride: 96 mmol/L — ABNORMAL LOW (ref 98–111)
Creatinine, Ser: 0.99 mg/dL (ref 0.61–1.24)
GFR, Estimated: >60 mL/min (ref >60)
Glucose, Bld: 88 mg/dL (ref 70–99)
Potassium: 4.8 mmol/L (ref 3.5–5.1)
Sodium: 128 mmol/L — ABNORMAL LOW (ref 135–145)

## 2021-01-17 NOTE — Progress Notes (Signed)
Patient presents for follow-up regarding scalp skin cancer.  His Mohs excision was done yesterday.  He presents today for evaluation and discussion of his reconstructive options.  On exam he has about a 6 cm diameter defect.  There does appear to be some exposed calvarium centrally.  No surrounding skin changes or signs of infection.  No active bleeding on exam.  Had a discussion with the patient about his options.  We discussed Integra and adjacent tissue transfer.  Ultimately in this frontal scalp location I recommended Integra.  I did explain that there may be some time before we know whether or not the Integra has worked but it is certainly a smaller procedure than the scalp flaps.  He is understanding and wants to move forward with this.

## 2021-01-17 NOTE — Telephone Encounter (Signed)
Called patient, he has not heard back from Dr. Lu Duffel practice yet, he stated that Dr. Claudia Desanctis was his surgeon. Will try contacting the two and finding out what needs to be done to get him his clearance.

## 2021-01-19 ENCOUNTER — Ambulatory Visit (HOSPITAL_BASED_OUTPATIENT_CLINIC_OR_DEPARTMENT_OTHER): Payer: Medicare Other | Admitting: Certified Registered"

## 2021-01-19 ENCOUNTER — Telehealth: Payer: Self-pay

## 2021-01-19 ENCOUNTER — Encounter (HOSPITAL_BASED_OUTPATIENT_CLINIC_OR_DEPARTMENT_OTHER): Payer: Self-pay | Admitting: Plastic Surgery

## 2021-01-19 ENCOUNTER — Encounter (HOSPITAL_BASED_OUTPATIENT_CLINIC_OR_DEPARTMENT_OTHER)
Admission: RE | Disposition: A | Discharge status: Home / Self Care | Payer: Self-pay | Source: Home / Self Care | Attending: Plastic Surgery

## 2021-01-19 ENCOUNTER — Other Ambulatory Visit: Payer: Self-pay

## 2021-01-19 ENCOUNTER — Ambulatory Visit (HOSPITAL_COMMUNITY)
Admission: RE | Admit: 2021-01-19 | Discharge: 2021-01-19 | Disposition: A | Discharge status: Home / Self Care | Payer: Medicare Other | Attending: Plastic Surgery | Admitting: Plastic Surgery

## 2021-01-19 DIAGNOSIS — C4442 Squamous cell carcinoma of skin of scalp and neck: Secondary | ICD-10-CM | POA: Insufficient documentation

## 2021-01-19 DIAGNOSIS — Z79899 Other long term (current) drug therapy: Secondary | ICD-10-CM | POA: Insufficient documentation

## 2021-01-19 DIAGNOSIS — Z87891 Personal history of nicotine dependence: Secondary | ICD-10-CM | POA: Insufficient documentation

## 2021-01-19 DIAGNOSIS — Z7984 Long term (current) use of oral hypoglycemic drugs: Secondary | ICD-10-CM | POA: Insufficient documentation

## 2021-01-19 DIAGNOSIS — Z888 Allergy status to other drugs, medicaments and biological substances status: Secondary | ICD-10-CM | POA: Diagnosis not present

## 2021-01-19 DIAGNOSIS — Z885 Allergy status to narcotic agent status: Secondary | ICD-10-CM | POA: Insufficient documentation

## 2021-01-19 DIAGNOSIS — Z880 Allergy status to penicillin: Secondary | ICD-10-CM | POA: Insufficient documentation

## 2021-01-19 DIAGNOSIS — Z7982 Long term (current) use of aspirin: Secondary | ICD-10-CM | POA: Insufficient documentation

## 2021-01-19 HISTORY — DX: Depression, unspecified: F32.A

## 2021-01-19 HISTORY — DX: Type 2 diabetes mellitus without complications: E11.9

## 2021-01-19 HISTORY — DX: Anxiety disorder, unspecified: F41.9

## 2021-01-19 HISTORY — PX: DEBRIDEMENT AND CLOSURE WOUND: SHX5614

## 2021-01-19 LAB — GLUCOSE, CAPILLARY
Glucose-Capillary: 100 mg/dL — ABNORMAL HIGH (ref 70–99)
Glucose-Capillary: 156 mg/dL — ABNORMAL HIGH (ref 70–99)

## 2021-01-19 SURGERY — DEBRIDEMENT, WOUND, WITH CLOSURE
Anesthesia: General | Site: Scalp

## 2021-01-19 MED ORDER — ONDANSETRON HCL 4 MG/2ML IJ SOLN
INTRAMUSCULAR | Status: AC
  Filled 2021-01-19: qty 2

## 2021-01-19 MED ORDER — LIDOCAINE-EPINEPHRINE 1 %-1:100000 IJ SOLN
INTRAMUSCULAR | Status: AC
  Filled 2021-01-19: qty 1

## 2021-01-19 MED ORDER — LIDOCAINE-EPINEPHRINE 1 %-1:100000 IJ SOLN
INTRAMUSCULAR | Status: DC | PRN
  Administered 2021-01-19: 20 mL

## 2021-01-19 MED ORDER — DEXAMETHASONE SODIUM PHOSPHATE 4 MG/ML IJ SOLN
INTRAMUSCULAR | Status: DC | PRN
  Administered 2021-01-19: 4 mg via INTRAVENOUS

## 2021-01-19 MED ORDER — LACTATED RINGERS IV SOLN: INTRAVENOUS | Status: DC

## 2021-01-19 MED ORDER — BUPIVACAINE-EPINEPHRINE (PF) 0.25% -1:200000 IJ SOLN
INTRAMUSCULAR | Status: AC
  Filled 2021-01-19: qty 90

## 2021-01-19 MED ORDER — TRAMADOL HCL 50 MG PO TABS: 50.0000 mg | ORAL_TABLET | Freq: Four times a day (QID) | ORAL | 0 refills | Status: AC | PRN

## 2021-01-19 MED ORDER — EPHEDRINE SULFATE 50 MG/ML IJ SOLN
INTRAMUSCULAR | Status: DC | PRN
  Administered 2021-01-19: 5 mg via INTRAVENOUS
  Administered 2021-01-19: 10 mg via INTRAVENOUS
  Administered 2021-01-19: 5 mg via INTRAVENOUS

## 2021-01-19 MED ORDER — FENTANYL CITRATE (PF) 100 MCG/2ML IJ SOLN: 25.0000 ug | INTRAMUSCULAR | Status: DC | PRN

## 2021-01-19 MED ORDER — LIDOCAINE 2% (20 MG/ML) 5 ML SYRINGE
INTRAMUSCULAR | Status: DC | PRN
  Administered 2021-01-19: 20 mg via INTRAVENOUS

## 2021-01-19 MED ORDER — ONDANSETRON HCL 4 MG PO TABS: 4.0000 mg | ORAL_TABLET | Freq: Three times a day (TID) | ORAL | 0 refills | Status: DC | PRN

## 2021-01-19 MED ORDER — EPHEDRINE 5 MG/ML INJ
INTRAVENOUS | Status: AC
  Filled 2021-01-19: qty 5

## 2021-01-19 MED ORDER — CLINDAMYCIN PHOSPHATE 900 MG/50ML IV SOLN
900.0000 mg | INTRAVENOUS | Status: AC
  Administered 2021-01-19: 900 mg via INTRAVENOUS

## 2021-01-19 MED ORDER — PROPOFOL 10 MG/ML IV BOLUS
INTRAVENOUS | Status: AC
  Filled 2021-01-19: qty 20

## 2021-01-19 MED ORDER — BUPIVACAINE HCL (PF) 0.25 % IJ SOLN
INTRAMUSCULAR | Status: AC
  Filled 2021-01-19: qty 30

## 2021-01-19 MED ORDER — FENTANYL CITRATE (PF) 100 MCG/2ML IJ SOLN
INTRAMUSCULAR | Status: AC
  Filled 2021-01-19: qty 2

## 2021-01-19 MED ORDER — ACETAMINOPHEN 500 MG PO TABS
ORAL_TABLET | ORAL | Status: AC
  Filled 2021-01-19: qty 2

## 2021-01-19 MED ORDER — EPINEPHRINE PF 1 MG/ML IJ SOLN
INTRAMUSCULAR | Status: AC
  Filled 2021-01-19: qty 1

## 2021-01-19 MED ORDER — ONDANSETRON HCL 4 MG/2ML IJ SOLN
INTRAMUSCULAR | Status: DC | PRN
  Administered 2021-01-19: 4 mg via INTRAVENOUS

## 2021-01-19 MED ORDER — CLINDAMYCIN PHOSPHATE 900 MG/50ML IV SOLN
INTRAVENOUS | Status: AC
  Filled 2021-01-19: qty 50

## 2021-01-19 MED ORDER — BUPIVACAINE-EPINEPHRINE 0.25% -1:200000 IJ SOLN
INTRAMUSCULAR | Status: DC | PRN
  Administered 2021-01-19: 30 mL

## 2021-01-19 MED ORDER — PROPOFOL 10 MG/ML IV BOLUS
INTRAVENOUS | Status: DC | PRN
  Administered 2021-01-19: 40 mg via INTRAVENOUS
  Administered 2021-01-19: 90 mg via INTRAVENOUS

## 2021-01-19 MED ORDER — FENTANYL CITRATE (PF) 100 MCG/2ML IJ SOLN
INTRAMUSCULAR | Status: DC | PRN
  Administered 2021-01-19: 50 ug via INTRAVENOUS

## 2021-01-19 MED ORDER — BACITRACIN ZINC 500 UNIT/GM EX OINT
TOPICAL_OINTMENT | CUTANEOUS | Status: AC
  Filled 2021-01-19: qty 1.8

## 2021-01-19 MED ORDER — ACETAMINOPHEN 500 MG PO TABS
1000.0000 mg | ORAL_TABLET | Freq: Once | ORAL | Status: AC
  Administered 2021-01-19: 1000 mg via ORAL

## 2021-01-19 SURGICAL SUPPLY — 66 items
ADH SKN CLS APL DERMABOND .7 (GAUZE/BANDAGES/DRESSINGS)
BLADE CLIPPER SURG (BLADE) IMPLANT
BLADE SURG 15 STRL LF DISP TIS (BLADE) ×2 IMPLANT
BLADE SURG 15 STRL SS (BLADE) ×3
BNDG CONFORM 2 STRL LF (GAUZE/BANDAGES/DRESSINGS) IMPLANT
BNDG ELASTIC 2X5.8 VLCR STR LF (GAUZE/BANDAGES/DRESSINGS) IMPLANT
BNDG GAUZE ELAST 4 BULKY (GAUZE/BANDAGES/DRESSINGS) IMPLANT
BUR PEAR (BURR) ×3 IMPLANT
CANISTER SUCT 1200ML W/VALVE (MISCELLANEOUS) ×3 IMPLANT
CLSR STERI-STRIP ANTIMIC 1/2X4 (GAUZE/BANDAGES/DRESSINGS) IMPLANT
CORD BIPOLAR FORCEPS 12FT (ELECTRODE) IMPLANT
COVER BACK TABLE 60X90IN (DRAPES) ×3 IMPLANT
COVER MAYO STAND STRL (DRAPES) ×3 IMPLANT
DERMABOND ADVANCED (GAUZE/BANDAGES/DRESSINGS)
DERMABOND ADVANCED .7 DNX12 (GAUZE/BANDAGES/DRESSINGS) IMPLANT
DRAPE U-SHAPE 76X120 STRL (DRAPES) ×3 IMPLANT
DRSG ADAPTIC 3X8 NADH LF (GAUZE/BANDAGES/DRESSINGS) ×3 IMPLANT
DRSG EMULSION OIL 3X3 NADH (GAUZE/BANDAGES/DRESSINGS) IMPLANT
DRSG PAD ABDOMINAL 8X10 ST (GAUZE/BANDAGES/DRESSINGS) ×3 IMPLANT
ELECT COATED BLADE 2.86 ST (ELECTRODE) ×3 IMPLANT
ELECT NEEDLE BLADE 2-5/6 (NEEDLE) IMPLANT
ELECT REM PT RETURN 9FT ADLT (ELECTROSURGICAL) ×3
ELECT REM PT RETURN 9FT PED (ELECTROSURGICAL)
ELECTRODE REM PT RETRN 9FT PED (ELECTROSURGICAL) IMPLANT
ELECTRODE REM PT RTRN 9FT ADLT (ELECTROSURGICAL) ×2 IMPLANT
GAUZE SPONGE 4X4 12PLY STRL LF (GAUZE/BANDAGES/DRESSINGS) ×3 IMPLANT
GAUZE XEROFORM 1X8 LF (GAUZE/BANDAGES/DRESSINGS) IMPLANT
GAUZE XEROFORM 5X9 LF (GAUZE/BANDAGES/DRESSINGS) IMPLANT
GLOVE SRG 8 PF TXTR STRL LF DI (GLOVE) ×2 IMPLANT
GLOVE SURG ENC TEXT LTX SZ7.5 (GLOVE) ×3 IMPLANT
GLOVE SURG UNDER POLY LF SZ8 (GLOVE) ×3
GOWN STRL REUS W/ TWL LRG LVL3 (GOWN DISPOSABLE) ×4 IMPLANT
GOWN STRL REUS W/TWL LRG LVL3 (GOWN DISPOSABLE) ×6
MATRIX TISSUE MESHED 4X5 (Tissue) ×3 IMPLANT
NEEDLE HYPO 30GX1 BEV (NEEDLE) IMPLANT
NEEDLE PRECISIONGLIDE 27X1.5 (NEEDLE) ×3 IMPLANT
NEEDLE SPNL 18GX3.5 QUINCKE PK (NEEDLE) IMPLANT
NS IRRIG 1000ML POUR BTL (IV SOLUTION) ×3 IMPLANT
PACK BASIN DAY SURGERY FS (CUSTOM PROCEDURE TRAY) ×3 IMPLANT
PENCIL SMOKE EVACUATOR (MISCELLANEOUS) ×3 IMPLANT
SHEET MEDIUM DRAPE 40X70 STRL (DRAPES) IMPLANT
SLEEVE SCD COMPRESS KNEE MED (STOCKING) ×3 IMPLANT
SPONGE GAUZE 2X2 8PLY STRL LF (GAUZE/BANDAGES/DRESSINGS) IMPLANT
STAPLER VISISTAT 35W (STAPLE) ×3 IMPLANT
STOCKINETTE 6  STRL (DRAPES) ×3
STOCKINETTE 6 STRL (DRAPES) ×2 IMPLANT
STRIP CLOSURE SKIN 1/2X4 (GAUZE/BANDAGES/DRESSINGS) IMPLANT
STRIP SUTURE WOUND CLOSURE 1/2 (MISCELLANEOUS) IMPLANT
SUCTION FRAZIER HANDLE 10FR (MISCELLANEOUS) ×3
SUCTION TUBE FRAZIER 10FR DISP (MISCELLANEOUS) ×2 IMPLANT
SUT CHROMIC 4 0 P 3 18 (SUTURE) IMPLANT
SUT ETHILON 3 0 PS 1 (SUTURE) ×6 IMPLANT
SUT ETHILON 4 0 P 3 18 (SUTURE) IMPLANT
SUT ETHILON 4 0 PS 2 18 (SUTURE) IMPLANT
SUT MNCRL AB 3-0 PS2 18 (SUTURE) IMPLANT
SUT MNCRL AB 4-0 PS2 18 (SUTURE) IMPLANT
SUT PDS 3-0 CT2 (SUTURE)
SUT PDS II 3-0 CT2 27 ABS (SUTURE) IMPLANT
SUT PROLENE 4 0 PS 2 18 (SUTURE) IMPLANT
SUT VICRYL 4-0 PS2 18IN ABS (SUTURE) IMPLANT
SYR BULB EAR ULCER 3OZ GRN STR (SYRINGE) ×3 IMPLANT
SYR CONTROL 10ML LL (SYRINGE) ×3 IMPLANT
TOWEL GREEN STERILE FF (TOWEL DISPOSABLE) ×6 IMPLANT
TRAY DSU PREP LF (CUSTOM PROCEDURE TRAY) IMPLANT
TUBE CONNECTING 20X1/4 (TUBING) IMPLANT
TUBING INFILTRATION IT-10001 (TUBING) IMPLANT

## 2021-01-19 NOTE — Anesthesia Postprocedure Evaluation (Signed)
Anesthesia Post Note  Patient: Cory [name]  Procedure(s) Performed: Closure of scalp defect (Scalp) Integra placement (Scalp)     Patient location during evaluation: PACU Anesthesia Type: General Level of consciousness: awake and alert, patient cooperative and oriented Pain management: pain level controlled Vital Signs Assessment: post-procedure vital signs reviewed and stable Respiratory status: spontaneous breathing, nonlabored ventilation and respiratory function stable Cardiovascular status: blood pressure returned to baseline and stable Postop Assessment: no apparent nausea or vomiting, able to ambulate and adequate PO intake Anesthetic complications: no   No notable events documented.  Last Vitals:  Vitals:   01/19/21 0900 01/19/21 0907  BP: 136/64   Pulse: 88   Resp: (!) 21   Temp:    SpO2: 97% 98%    Last Pain:  Vitals:   01/19/21 0907  TempSrc:   PainSc: 0-No pain                 Bartosz Luginbill,E. Juelle Dickmann

## 2021-01-19 NOTE — Telephone Encounter (Signed)
Patient called to say that he had surgery with Dr. Claudia Desanctis today and would like to know when to start taking his medications again.  Patient said that if he does not hear from [place], then he will assume that he should start again tomorrow morning with his plavix.  Please call.

## 2021-01-19 NOTE — Interval H&P Note (Signed)
Patient seen and examined. Risk and benefits discussed. Proceed with surgery.

## 2021-01-19 NOTE — Transfer of Care (Signed)
Immediate Anesthesia Transfer of Care Note  Patient: Cory [name]  Procedure(s) Performed: Closure of scalp defect possible with adjacent tissue transfer possible  integra placement  Patient Location: PACU  Anesthesia Type:General  Level of Consciousness: drowsy  Airway & Oxygen Therapy: Patient Spontanous Breathing and Patient connected to face mask oxygen  Post-op Assessment: Report given to RN and Post -op Vital signs reviewed and stable  Post vital signs: Reviewed and stable  Last Vitals:  Vitals Value Taken Time  BP 144/72 01/19/21 0837  Temp    Pulse 92 01/19/21 0838  Resp 10 01/19/21 0838  SpO2 98 % 01/19/21 0838  Vitals shown include unvalidated device data.  Last Pain:  Vitals:   01/19/21 0647  TempSrc: Oral  PainSc: 2          Complications: No notable events documented.

## 2021-01-19 NOTE — Brief Op Note (Signed)
01/19/2021  8:37 AM  PATIENT:  Cory [name]  80 y.o. male  PRE-OPERATIVE DIAGNOSIS:  Squamous cell carcinoma of scalp  POST-OPERATIVE DIAGNOSIS:  Squamous cell carcinoma of scalp  PROCEDURE:  Procedure(s): Closure of scalp defect (N/A) possible with adjacent tissue transfer (N/A) possible  integra placement (N/A)  SURGEON:  Surgeon(s) and Role:    * Addilynne Olheiser, Steffanie Dunn, MD - Primary  PHYSICIAN ASSISTANT: Software engineer, PA  ASSISTANTS: none   ANESTHESIA:   general  EBL:  5 mL   BLOOD ADMINISTERED:none  DRAINS: none   LOCAL MEDICATIONS USED:  MARCAINE     SPECIMEN:  No Specimen  DISPOSITION OF SPECIMEN:  PATHOLOGY  COUNTS:  YES  TOURNIQUET:  * No tourniquets in log *  DICTATION: .Dragon Dictation  PLAN OF CARE: Discharge to home after PACU  PATIENT DISPOSITION:  PACU - hemodynamically stable.   Delay start of Pharmacological VTE agent (>24hrs) due to surgical blood loss or risk of bleeding: not applicable

## 2021-01-19 NOTE — Discharge Instructions (Addendum)
Activity As tolerated: Do not get your scalp wet, you can shower, but no water should come in contact with your scalp. NO driving No heavy activities  Diet: Regular. Drink plenty of fluids (Water. Avoid sugar/sodas/diet sodas)  Wound Care: Keep dressing clean & dry. Change gauze/pad dressing as needed.  Take tylenol as needed for moderate pain control. Recommend 2 tablets of 325mg  tylenol every 6 hours. You can also use narcotic (tramadol) as needed for severe pain. Prescription was sent to your pharmacy today (01/19/21) after your procedure.  Special Instructions: Call Doctor if any unusual problems occur such as pain, excessive bleeding, unrelieved Nausea/vomiting, Fever &/or chills Call with questions  Follow-up appointment: Scheduled for next week.   Post Anesthesia Home Care Instructions  Activity: Get plenty of rest for the remainder of the day. A responsible individual must stay with you for 24 hours following the procedure.  For the next 24 hours, DO NOT: -Drive a car -Paediatric nurse -Drink alcoholic beverages -Take any medication unless instructed by your physician -Make any legal decisions or sign important papers.  Meals: Start with liquid foods such as gelatin or soup. Progress to regular foods as tolerated. Avoid greasy, spicy, heavy foods. If nausea and/or vomiting occur, drink only clear liquids until the nausea and/or vomiting subsides. Call your physician if vomiting continues.  Special Instructions/Symptoms: Your throat may feel dry or sore from the anesthesia or the breathing tube placed in your throat during surgery. If this causes discomfort, gargle with warm salt water. The discomfort should disappear within 24 hours.  If you had a scopolamine patch placed behind your ear for the management of post- operative nausea and/or vomiting:  1. The medication in the patch is effective for 72 hours, after which it should be removed.  Wrap patch in a tissue and  discard in the trash. Wash hands thoroughly with soap and water. 2. You may remove the patch earlier than 72 hours if you experience unpleasant side effects which may include dry mouth, dizziness or visual disturbances. 3. Avoid touching the patch. Wash your hands with soap and water after contact with the patch.      Next dose of Tylenol at home after 1pm as needed for pain.

## 2021-01-19 NOTE — Anesthesia Preprocedure Evaluation (Addendum)
Anesthesia Evaluation  Patient identified by MRN, date of birth, ID band Patient awake    Reviewed: Allergy & Precautions, NPO status , Patient's Chart, lab work & pertinent test results  History of Anesthesia Complications Negative for: history of anesthetic complications  Airway Mallampati: II  TM Distance: >3 FB Neck ROM: Full    Dental  (+) Dental Advisory Given   Pulmonary former smoker,    breath sounds clear to auscultation       Cardiovascular hypertension, Pt. on medications (-) angina+ CAD, + Cardiac Stents and + CABG   Rhythm:Regular Rate:Normal  04/2020 ECHO: EF 60-65%, normal LVF with Grade 1 DD, mild MR   Neuro/Psych Anxiety Depression TIA   GI/Hepatic Neg liver ROS, GERD  Medicated and Controlled,  Endo/Other  diabetes (glu 100), Oral Hypoglycemic Agents  Renal/GU negative Renal ROS     Musculoskeletal   Abdominal   Peds  Hematology negative hematology ROS (+)   Anesthesia Other Findings   Reproductive/Obstetrics                            Anesthesia Physical Anesthesia Plan  ASA: 3  Anesthesia Plan: General   Post-op Pain Management:    Induction: Intravenous  PONV Risk Score and Plan: 2 and Ondansetron and Dexamethasone  Airway Management Planned: LMA  Additional Equipment: None  Intra-op Plan:   Post-operative Plan:   Informed Consent: I have reviewed the patients History and Physical, chart, labs and discussed the procedure including the risks, benefits and alternatives for the proposed anesthesia with the patient or authorized representative who has indicated his/her understanding and acceptance.     Dental advisory given  Plan Discussed with: CRNA and Surgeon  Anesthesia Plan Comments:        Anesthesia Quick Evaluation

## 2021-01-19 NOTE — Anesthesia Procedure Notes (Signed)
Procedure Name: LMA Insertion Date/Time: 01/19/2021 7:45 AM Performed by: Ezequiel Kayser, CRNA Pre-anesthesia Checklist: Patient identified, Emergency Drugs available, Suction available and Patient being monitored Patient Re-evaluated:Patient Re-evaluated prior to induction Oxygen Delivery Method: Circle System Utilized Preoxygenation: Pre-oxygenation with 100% oxygen Induction Type: IV induction Ventilation: Mask ventilation without difficulty LMA: LMA inserted LMA Size: 4.0 Number of attempts: 1 Airway Equipment and Method: Bite block Placement Confirmation: positive ETCO2 Tube secured with: Tape Dental Injury: Teeth and Oropharynx as per pre-operative assessment

## 2021-01-19 NOTE — Op Note (Signed)
Operative Note   DATE OF OPERATION: 01/19/2021  SURGICAL DEPARTMENT: Plastic Surgery  PREOPERATIVE DIAGNOSES: 7 x 5 cm scalp defect  POSTOPERATIVE DIAGNOSES:  same  PROCEDURE: 1.  Debridement of scalp defect totaling 7 x 5 cm including skin and subcutaneous tissue and bone 2.  Placement of Integra meshed bilayer skin substitute totaling 7 x 5 cm  SURGEON: Talmadge Coventry, MD  ASSISTANT: Verdie Shire, PA The advanced practice practitioner (APP) assisted throughout the case.  The APP was essential in retraction and counter traction when needed to make the case progress smoothly.  This retraction and assistance made it possible to see the tissue plans for the procedure.  The assistance was needed for blood control, tissue re-approximation and assisted with closure of the incision site.  ANESTHESIA:  General.   COMPLICATIONS: None.   INDICATIONS FOR PROCEDURE:  The patient, Cory [name] is a 80 y.o. male born on 10/04/1940, is here for treatment of scalp defect from skin cancer excision MRN: [id number]  CONSENT:  Informed consent was obtained directly from the patient. Risks, benefits and alternatives were fully discussed. Specific risks including but not limited to bleeding, infection, hematoma, seroma, scarring, pain, contracture, asymmetry, wound healing problems, and need for further surgery were all discussed. The patient did have an ample opportunity to have questions answered to satisfaction.   DESCRIPTION OF PROCEDURE:  The patient was taken to the operating room. SCDs were placed and antibiotics were given.  General anesthesia was administered.  The patient's operative site was prepped and draped in a sterile fashion. A time out was performed and all information was confirmed to be correct.  Started by examining the wound.  This was irregularly shaped 7 x 5 cm in size in the frontal scalp.  The central 3 x 3 cm were without pericranium and the skull was exposed.  I started by  debriding the wound after infiltrating local anesthetic.  I used a bur for the bone to freshen up the surface and removed any desiccated pericranium from around it.  The skin and subcutaneous tissues were then debrided to remove all devitalized or questionable tissue.  Wound was then copiously irrigated with saline.  Meshed Integra bilayer was then brought onto the field.  It was rinsed and saline for 2 to 3 minutes.  It was then stapled into position to the shape of the wound.  Bolster was then fashioned with Adaptic, scrub brush sponge, and 3-0 nylon sutures.  Soft dressing was then applied.  The patient tolerated the procedure well.  There were no complications. The patient was allowed to wake from anesthesia, extubated and taken to the recovery room in satisfactory condition.

## 2021-01-22 ENCOUNTER — Encounter: Payer: Self-pay | Admitting: Urology

## 2021-01-22 NOTE — Telephone Encounter (Addendum)
While on the phone the patient stated that he had surgery on Friday and he wanted to know when to take the dressing off.    Asked the patient when is his next appointment, and if he was having any issues.    Patient stated that his next appointment is Thursday, and he's not having any issues.  He's taking (2) Tylenol 500mg  for pain, but he also has Tramdol if needed.  He stated that he feels his dreams have been affected, and he's using a shower cap on his head.  Informed the patient he's not to remove the dressing from his head until he come in for his follow-up.   Patient verbalized understanding and agreed.//AB/CMA

## 2021-01-22 NOTE — Telephone Encounter (Signed)
Spoke with the patient on (01/19/21) regarding the message below.  Informed the patient that he will need to call and speak with his Cardiologist regarding when to restart the Plavix.  Patient verbalized understanding and agreed to call his Cardiologist.//AB/CMA

## 2021-01-22 NOTE — Telephone Encounter (Addendum)
Called and spoke with the patient to see if he called his Cardiologist on Friday regarding when to restart his Plavix.  Patient stated he did not speak with the Cardiologist office on Friday because it was to late to give them a call.    Patient stated that he restated his Plavix on Saturday.  Informed the patient to contact his Cardiologist today to verify when to continue on the Plavix.  Patient verbalized understanding and agreed.//AB/CMA

## 2021-01-23 ENCOUNTER — Ambulatory Visit: Payer: Medicare Other | Admitting: Family Medicine

## 2021-01-25 ENCOUNTER — Ambulatory Visit (INDEPENDENT_AMBULATORY_CARE_PROVIDER_SITE_OTHER): Payer: Medicare Other | Admitting: Plastic Surgery

## 2021-01-25 ENCOUNTER — Other Ambulatory Visit: Payer: Self-pay

## 2021-01-25 ENCOUNTER — Encounter (HOSPITAL_BASED_OUTPATIENT_CLINIC_OR_DEPARTMENT_OTHER): Payer: Self-pay | Admitting: Plastic Surgery

## 2021-01-25 DIAGNOSIS — C4442 Squamous cell carcinoma of skin of scalp and neck: Secondary | ICD-10-CM

## 2021-01-25 NOTE — Progress Notes (Signed)
Patient presents about 1 week postop from Integra placement for a frontal scalp defect with some areas of exposed skull.  He feels like he is doing well.  On exam the bolster was removed looks like the Integra is incorporating appropriately.  No surrounding erythema or purulence suggestive of infection.  We will plan to continue with conservative wound care after the Integra placement and I will see him again in 3 weeks.  All of his questions were answered.

## 2021-01-29 ENCOUNTER — Ambulatory Visit (INDEPENDENT_AMBULATORY_CARE_PROVIDER_SITE_OTHER): Payer: Medicare Other | Admitting: Family Medicine

## 2021-01-29 ENCOUNTER — Telehealth: Payer: Self-pay | Admitting: *Deleted

## 2021-01-29 ENCOUNTER — Other Ambulatory Visit: Payer: Self-pay

## 2021-01-29 VITALS — BP 120/62 | HR 71 | Temp 97.7°F | Ht 66.0 in | Wt 164.2 lb

## 2021-01-29 DIAGNOSIS — Z23 Encounter for immunization: Secondary | ICD-10-CM

## 2021-01-29 DIAGNOSIS — R7303 Prediabetes: Secondary | ICD-10-CM

## 2021-01-29 DIAGNOSIS — I69351 Hemiplegia and hemiparesis following cerebral infarction affecting right dominant side: Secondary | ICD-10-CM

## 2021-01-29 DIAGNOSIS — D649 Anemia, unspecified: Secondary | ICD-10-CM | POA: Diagnosis not present

## 2021-01-29 DIAGNOSIS — Z1159 Encounter for screening for other viral diseases: Secondary | ICD-10-CM | POA: Diagnosis not present

## 2021-01-29 DIAGNOSIS — I6930 Unspecified sequelae of cerebral infarction: Secondary | ICD-10-CM | POA: Diagnosis not present

## 2021-01-29 DIAGNOSIS — I10 Essential (primary) hypertension: Secondary | ICD-10-CM | POA: Diagnosis not present

## 2021-01-29 DIAGNOSIS — Z0000 Encounter for general adult medical examination without abnormal findings: Secondary | ICD-10-CM | POA: Diagnosis not present

## 2021-01-29 NOTE — Telephone Encounter (Signed)
Pt was seen today and has had his surgery.

## 2021-01-29 NOTE — Patient Instructions (Signed)
#  Referral - Physical Therapy I have placed a referral to a specialist for you. You should receive a phone call from the specialty office. Make sure your voicemail is not full and that if you are able to answer your phone to unknown or new numbers.   It may take up to 2 weeks to hear about the referral. If you do not hear anything in 2 weeks, please call our office and ask to speak with the referral coordinator.

## 2021-01-29 NOTE — Progress Notes (Signed)
Subjective:   Cory [name] is a 80 y.o. male who presents for Medicare Annual/Subsequent preventive examination.  Review of Systems    Review of Systems  Constitutional:  Negative for chills and fever.  HENT:  Negative for congestion and sore throat.   Eyes:  Negative for blurred vision and double vision.  Respiratory:  Negative for shortness of breath.   Cardiovascular:  Negative for chest pain.  Gastrointestinal:  Negative for heartburn, nausea and vomiting.  Genitourinary: Negative.   Musculoskeletal: Negative.  Negative for myalgias.       Balance concern  Skin:  Negative for rash.  Neurological:  Negative for dizziness and headaches.  Endo/Heme/Allergies:  Does not bruise/bleed easily.  Psychiatric/Behavioral:  Negative for depression. The patient is not nervous/anxious.          Objective:    Today's Vitals   01/29/21 1454 01/29/21 1504  BP: 120/62   Pulse: 71   Temp: 97.7 F (36.5 C)   TempSrc: Temporal   SpO2: 97%   Weight: 164 lb 4 oz (74.5 kg)   Height: 5\' 6"  (1.676 m)   PainSc:  0-No pain   Body mass index is 26.51 kg/m.  Advanced Directives 01/29/2021 01/19/2021 01/16/2021 07/18/2020 05/06/2020  Does Patient Have a Medical Advance Directive? Yes Yes Yes Yes No  Type of Paramedic of [place];Living will - [place];Living will - -  Does patient want to make changes to medical advance directive? Yes (MAU/Ambulatory/Procedural Areas - Information given) No - Patient declined No - Patient declined No - Patient declined -  Copy of [place] in Chart? No - copy requested - - - -  Would patient like information on creating a medical advance directive? - - - - No - Patient declined    Current Medications (verified) Outpatient Encounter Medications as of 01/29/2021  Medication Sig   acetaminophen (TYLENOL) 500 MG tablet Take 1,000 mg by mouth at bedtime.   amLODipine (NORVASC) 5 MG tablet Take 5 mg  by mouth daily.   aspirin EC 81 MG tablet Take 1 tablet (81 mg total) by mouth daily. Swallow whole.   atorvastatin (LIPITOR) 40 MG tablet TAKE 1 TABLET EVERY DAY   cloNIDine (CATAPRES) 0.1 MG tablet Take 1 tablet (0.1 mg total) by mouth 2 (two) times daily.   clopidogrel (PLAVIX) 75 MG tablet Take 1 tablet (75 mg total) by mouth daily.   cycloSPORINE (RESTASIS) 0.05 % ophthalmic emulsion 1 drop 2 (two) times a day.   diclofenac Sodium (VOLTAREN) 1 % GEL Apply 2 g topically as needed.   escitalopram (LEXAPRO) 5 MG tablet Take 10 mg by mouth daily.   famotidine (PEPCID) 20 MG tablet Take 20 mg by mouth at bedtime.   fluticasone (FLONASE) 50 MCG/ACT nasal spray 1 spray by Each Nare route nightly.   latanoprost (XALATAN) 0.005 % ophthalmic solution daily.   lisinopril (ZESTRIL) 10 MG tablet Take 1 tablet (10 mg total) by mouth daily.   melatonin 5 MG TABS Take 5 mg by mouth at bedtime.   metFORMIN (GLUCOPHAGE) 500 MG tablet TAKE 1 TABLET (500 MG TOTAL) BY MOUTH DAILY WITH BREAKFAST.   montelukast (SINGULAIR) 10 MG tablet Take 1 tablet (10 mg total) by mouth at bedtime.   Multiple Vitamins-Minerals (PX COMPLETE SENIOR MULTIVITS) TABS Take by mouth daily.   nitroGLYCERIN (NITROSTAT) 0.4 MG SL tablet Place 1 tablet (0.4 mg total) under the tongue every 5 (five) minutes as needed.  ondansetron (ZOFRAN) 4 MG tablet Take 1 tablet (4 mg total) by mouth every 8 (eight) hours as needed for nausea or vomiting.   pantoprazole (PROTONIX) 40 MG tablet Take 1 tablet (40 mg total) by mouth 2 (two) times daily. (Patient taking differently: Take 40 mg by mouth daily.)   tamsulosin (FLOMAX) 0.4 MG CAPS capsule Take 1 capsule (0.4 mg total) by mouth daily.   Vibegron 75 MG TABS Take 75 mg by mouth daily at 6 (six) AM.   No facility-administered encounter medications on file as of 01/29/2021.    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   Left scalp - EDC   Stroke ([place])    on Plavix and ASA   Vasovagal syncope    Past Surgical History:  Procedure Laterality Date   BACK SURGERY     CHOLECYSTECTOMY     CORONARY ARTERY BYPASS GRAFT  2000   DEBRIDEMENT AND CLOSURE WOUND N/A 01/19/2021   Procedure: Closure of scalp defect;  Surgeon: Cindra Presume, MD;  Location: [facility];  Service: Plastics;  Laterality: N/A;   NECK SURGERY     Family History  Problem Relation Age of Onset   Heart Problems Mother    Heart disease Mother    Heart Problems Father    Heart attack Father    Heart disease Brother    Social History   Socioeconomic History   Marital status: Married    Spouse name: Not on file   Number of children: Not on file   Years of education: Not on file   Highest education level: Not on file  Occupational History   Occupation: retired  Tobacco Use   Smoking status: Former    Types: Pipe   Smokeless tobacco: Never  Scientific laboratory technician Use: Never used  Substance and Sexual Activity   Alcohol use: Never   Drug use: Never   Sexual activity: Yes  Other Topics Concern   Not on file  Social History Narrative   Not on file   Social Determinants of Health   Financial Resource Strain: Not on file  Food Insecurity: Not on file  Transportation Needs: Not on file  Physical Activity: Not on file  Stress: Not on file  Social Connections: Not on file    Tobacco Counseling Counseling given: Not Answered   Clinical Intake:  Pre-visit preparation completed: No  Pain : No/denies  pain Pain Score: 0-No pain     BMI - recorded: 26.51 Nutritional Status: BMI 25 -29 Overweight Nutritional Risks: None Diabetes: No  How often do you need to have someone help you when you read instructions, pamphlets, or other written materials from your doctor or pharmacy?: 1 - Never What is the last grade level you completed in school?: college  Diabetic? no  Interpreter Needed?: No      Activities of Daily Living In your present state of health, do you have any difficulty performing the following activities: 01/19/2021  Hearing? Darreld Mclean  Vision? N  Difficulty concentrating or making decisions? N  Walking or climbing stairs? Y  Dressing or bathing? N  Some recent data might be hidden    Patient Care Team: [name], MD as PCP - General (Family Medicine) Martinique, Peter M, MD as PCP - Cardiology (Cardiology)  Indicate any recent Medical Services you may have received from other than [facility] providers in the past year (date may be approximate).     Assessment:   This is a routine wellness examination for [place].  Hearing/Vision screen Hearing Screening   250Hz  500Hz  1000Hz  2000Hz  4000Hz   Right ear 20 20 0 0 0  Left ear 20 20 20 25  0  Vision Screening - Comments:: Last eye exam at [facility] in August 2022  Dietary issues and exercise activities discussed:     Goals Addressed             This Visit's Progress    Patient Stated       Maintain general health      Depression Screen PHQ 2/9 Scores 04/13/2020  PHQ - 2 Score 0    Fall Risk Fall Risk  01/29/2021 04/13/2020  Falls in the past year? 0 1  Number falls in past yr: 0 0  Risk for fall due to : - Impaired balance/gait    FALL RISK PREVENTION PERTAINING TO THE HOME:  Any stairs in or around the home? Yes  If so, are there any without handrails? Yes  Home free of loose throw rugs in walkways, pet beds, electrical cords, etc? Yes  Adequate lighting in your home to reduce risk of falls?  Yes   ASSISTIVE DEVICES UTILIZED TO PREVENT FALLS:  Life alert? No  Use of a cane, walker or w/c? Yes  Grab bars in the bathroom? No  Shower chair or bench in shower? Yes  Elevated toilet seat or a handicapped toilet? No    Cognitive Function:        Mini-Cog - 01/29/21 1515     Normal clock drawing test? yes    How many words correct? 3              Immunizations Immunization History  Administered Date(s) Administered   DTaP 01/24/2020   Fluad Quad(high Dose 65+) 01/28/2020   PFIZER(Purple Top)SARS-COV-2 Vaccination 05/25/2019, 06/15/2019   Pneumococcal Conjugate-13 05/10/2013   Pneumococcal Polysaccharide-23 12/03/2005   Td 12/15/2018   Tdap 04/12/2011   Zoster Recombinat (Shingrix) 10/17/2017, 12/23/2017    TDAP status: Up to date  Flu Vaccine status: Completed at today's visit  Pneumococcal vaccine status: Up to date  Covid-19 vaccine status: Completed vaccines  Qualifies for Shingles Vaccine? Yes   Zostavax completed No   Shingrix Completed?: Yes  Screening Tests Health Maintenance  Topic Date Due   COVID-19 Vaccine (3 - Pfizer risk series) 07/13/2019   INFLUENZA VACCINE  11/27/2020   TETANUS/TDAP  12/14/2028   Zoster Vaccines- Shingrix  Completed   HPV VACCINES  Aged Out    Health Maintenance  Health Maintenance Due  Topic Date Due   COVID-19 Vaccine (3 - Pfizer risk series) 07/13/2019   INFLUENZA VACCINE  11/27/2020    Colorectal cancer screening: No longer required.   Lung Cancer Screening: (Low Dose CT Chest recommended if Age 71-80 years, 30 pack-year currently smoking OR have quit w/in 15years.) does not qualify.   Lung Cancer Screening Referral: n/a  Additional Screening:  Hepatitis C Screening: does qualify; never done - will do today  Vision Screening: Recommended annual ophthalmology exams for early detection of glaucoma and other disorders of the eye. Is the patient up to date with their annual eye exam?  Yes  Who is  the provider or what is the name of the office in which the patient attends annual eye exams? [facility] - [facility] Ramsay If pt is not established with a provider, would they like to be referred to a provider to establish care? No .   Dental Screening: Recommended annual dental exams for proper oral hygiene  Community Resource Referral / Chronic Care Management: CRR required this visit?  No   CCM required this visit?  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 [facility])   Relevant Orders   Ambulatory referral to Physical Therapy     Other   Prediabetes   Relevant Orders   Hemoglobin A1c   History of CVA with residual deficit   Relevant Orders   Comprehensive metabolic panel   Lipid panel   Ambulatory referral to Physical Therapy   Other Visit Diagnoses     Encounter for Medicare annual wellness exam    -  Primary   Need for influenza vaccination       Relevant Orders   Flu Vaccine QUAD High Dose(Fluad) (Completed)   Need for hepatitis C screening test       Relevant Orders   Hepatitis C antibody   Anemia, unspecified type       Relevant Orders   CBC        I have personally reviewed and noted the following in the patient's chart:   Medical and social history Use of alcohol, tobacco or illicit drugs  Current medications and supplements including opioid prescriptions. Patient is not currently taking opioid prescriptions. Functional ability and status Nutritional status Physical activity Advanced directives List of other physicians Hospitalizations, surgeries, and ER visits in previous 12 months Vitals Screenings to include cognitive, depression, and falls Referrals and appointments  In addition, I have reviewed and discussed with patient  certain preventive protocols, quality metrics, and best practice recommendations. A written personalized care plan for preventive services as well as general preventive health recommendations were provided to patient.     [name], MD   01/29/2021

## 2021-01-29 NOTE — Telephone Encounter (Signed)
Faxed order on (01/25/21) to [facility] for supplies for the patient.     Supplies:Gauze-(4x4)-Daily                Tape-Medipore-(4 inch)-Daily                Xeroform-(1x8in)-Daily                Non-Adherent Pad (3x4)-Daily   Confirmation received and copy scanned into the chart.//AB/CMA

## 2021-01-29 NOTE — Telephone Encounter (Signed)
Received Order Status Notification from [facility] stating:[facility] has provided service for the patient; no further action is required.//AB/CMA

## 2021-01-30 LAB — CBC
HCT: 33.2 % — ABNORMAL LOW (ref 39.0–52.0)
Hemoglobin: 10.7 g/dL — ABNORMAL LOW (ref 13.0–17.0)
MCHC: 32.3 g/dL (ref 30.0–36.0)
MCV: 86.5 fl (ref 78.0–100.0)
Platelets: 237 10*3/uL (ref 150.0–400.0)
RBC: 3.83 Mil/uL — ABNORMAL LOW (ref 4.22–5.81)
RDW: 14.1 % (ref 11.5–15.5)
WBC: 6.1 10*3/uL (ref 4.0–10.5)

## 2021-01-30 LAB — COMPREHENSIVE METABOLIC PANEL
ALT: 43 U/L (ref 0–53)
AST: 24 U/L (ref 0–37)
Albumin: 4.1 g/dL (ref 3.5–5.2)
Alkaline Phosphatase: 79 U/L (ref 39–117)
BUN: 15 mg/dL (ref 6–23)
CO2: 23 mEq/L (ref 19–32)
Calcium: 9 mg/dL (ref 8.4–10.5)
Chloride: 96 mEq/L (ref 96–112)
Creatinine, Ser: 1.05 mg/dL (ref 0.40–1.50)
GFR: 66.44 mL/min (ref >60.00)
Glucose, Bld: 92 mg/dL (ref 70–99)
Potassium: 4.5 mEq/L (ref 3.5–5.1)
Sodium: 129 mEq/L — ABNORMAL LOW (ref 135–145)
Total Bilirubin: 0.4 mg/dL (ref 0.2–1.2)
Total Protein: 6.5 g/dL (ref 6.0–8.3)

## 2021-01-30 LAB — LIPID PANEL
Cholesterol: 120 mg/dL (ref 0–200)
HDL: 44.2 mg/dL (ref >39.00)
NonHDL: 75.48
Total CHOL/HDL Ratio: 3
Triglycerides: 206 mg/dL — ABNORMAL HIGH (ref 0.0–149.0)
VLDL: 41.2 mg/dL — ABNORMAL HIGH (ref 0.0–40.0)

## 2021-01-30 LAB — LDL CHOLESTEROL, DIRECT: Direct LDL: 51 mg/dL

## 2021-01-30 LAB — HEMOGLOBIN A1C: Hgb A1c MFr Bld: 6.5 % (ref 4.6–6.5)

## 2021-01-31 ENCOUNTER — Other Ambulatory Visit: Payer: Self-pay | Admitting: Family Medicine

## 2021-01-31 ENCOUNTER — Encounter: Payer: Self-pay | Admitting: Family Medicine

## 2021-01-31 DIAGNOSIS — I10 Essential (primary) hypertension: Secondary | ICD-10-CM

## 2021-01-31 DIAGNOSIS — I6930 Unspecified sequelae of cerebral infarction: Secondary | ICD-10-CM

## 2021-01-31 DIAGNOSIS — E871 Hypo-osmolality and hyponatremia: Secondary | ICD-10-CM

## 2021-01-31 DIAGNOSIS — R7303 Prediabetes: Secondary | ICD-10-CM

## 2021-02-01 ENCOUNTER — Encounter: Payer: Self-pay | Admitting: Family Medicine

## 2021-02-01 ENCOUNTER — Other Ambulatory Visit: Payer: Self-pay | Admitting: *Deleted

## 2021-02-01 ENCOUNTER — Ambulatory Visit: Payer: Medicare Other | Admitting: Dermatology

## 2021-02-01 ENCOUNTER — Encounter: Payer: Self-pay | Admitting: Urology

## 2021-02-01 DIAGNOSIS — D649 Anemia, unspecified: Secondary | ICD-10-CM

## 2021-02-01 MED ORDER — METFORMIN HCL 500 MG PO TABS
500.0000 mg | ORAL_TABLET | Freq: Two times a day (BID) | ORAL | 0 refills | Status: DC
Start: 2021-02-01 — End: 2021-04-17

## 2021-02-03 LAB — TEST AUTHORIZATION

## 2021-02-03 LAB — FERRITIN: Ferritin: 12 ng/mL — ABNORMAL LOW (ref 24–380)

## 2021-02-03 LAB — HEPATITIS C ANTIBODY
Hepatitis C Ab: NONREACTIVE
SIGNAL TO CUT-OFF: 0.01 (ref <1.00)

## 2021-02-05 ENCOUNTER — Other Ambulatory Visit: Payer: Self-pay | Admitting: Family Medicine

## 2021-02-05 DIAGNOSIS — D509 Iron deficiency anemia, unspecified: Secondary | ICD-10-CM

## 2021-02-05 MED ORDER — IRON (FERROUS SULFATE) 325 (65 FE) MG PO TABS: 325.0000 mg | ORAL_TABLET | Freq: Every day | ORAL | 1 refills | Status: DC

## 2021-02-06 ENCOUNTER — Other Ambulatory Visit: Payer: Medicare Other

## 2021-02-06 ENCOUNTER — Other Ambulatory Visit: Payer: Self-pay

## 2021-02-06 DIAGNOSIS — D649 Anemia, unspecified: Secondary | ICD-10-CM

## 2021-02-06 DIAGNOSIS — D509 Iron deficiency anemia, unspecified: Secondary | ICD-10-CM

## 2021-02-06 DIAGNOSIS — E871 Hypo-osmolality and hyponatremia: Secondary | ICD-10-CM

## 2021-02-06 NOTE — Addendum Note (Signed)
Addended by: Ezequiel Ganser on: 02/06/2021 03:24 PM   Modules accepted: Orders

## 2021-02-07 NOTE — Addendum Note (Signed)
Addended by: Ezequiel Ganser on: 02/07/2021 03:54 PM   Modules accepted: Orders

## 2021-02-09 LAB — SODIUM, URINE, RANDOM: Sodium, Ur: 17 mmol/L — ABNORMAL LOW (ref 28–272)

## 2021-02-09 LAB — OSMOLALITY, URINE: Osmolality, Ur: 309 mOsm/kg (ref 50–1200)

## 2021-02-12 ENCOUNTER — Encounter: Payer: Self-pay | Admitting: Family Medicine

## 2021-02-12 ENCOUNTER — Other Ambulatory Visit: Payer: Self-pay | Admitting: Family Medicine

## 2021-02-12 DIAGNOSIS — E871 Hypo-osmolality and hyponatremia: Secondary | ICD-10-CM

## 2021-02-15 ENCOUNTER — Ambulatory Visit (INDEPENDENT_AMBULATORY_CARE_PROVIDER_SITE_OTHER): Payer: Medicare Other | Admitting: Plastic Surgery

## 2021-02-15 ENCOUNTER — Other Ambulatory Visit: Payer: Self-pay

## 2021-02-15 ENCOUNTER — Encounter: Payer: Self-pay | Admitting: Family Medicine

## 2021-02-15 DIAGNOSIS — C4442 Squamous cell carcinoma of skin of scalp and neck: Secondary | ICD-10-CM

## 2021-02-15 DIAGNOSIS — I10 Essential (primary) hypertension: Secondary | ICD-10-CM

## 2021-02-15 MED ORDER — CLONIDINE HCL 0.1 MG PO TABS: 0.1000 mg | ORAL_TABLET | Freq: Two times a day (BID) | ORAL | 3 refills | Status: DC

## 2021-02-15 NOTE — Telephone Encounter (Signed)
Refill request from [facility] for next refill. Last refilled on 04/13/20 #180 with 3 refills LOV 01/29/21 AWV Next appointment for follow up on 06/13/21

## 2021-02-15 NOTE — Progress Notes (Signed)
Patient presents about 4 weeks out from Integra placement for a large scalp wound.  He feels good overall.  On exam the outer silicone layer is still in place.  There was a little bit of fluid under the Integra centrally which I milked out.  I then cut a couple holes in the silicone layer to allow fluid to freely escape.  It appears that the majority of this has taken.  There may be a small defect centrally but I did not remove the silicone entirely to facilitate wound care.  Hopefully this will granulate in by his next visit.  We will plan to see him again in 3 weeks.  All of his questions were answered.

## 2021-02-16 MED ORDER — CLONIDINE HCL 0.1 MG PO TABS: 0.1000 mg | ORAL_TABLET | Freq: Two times a day (BID) | ORAL | 3 refills | Status: DC

## 2021-02-17 ENCOUNTER — Other Ambulatory Visit: Payer: Self-pay | Admitting: Family Medicine

## 2021-02-17 DIAGNOSIS — J302 Other seasonal allergic rhinitis: Secondary | ICD-10-CM

## 2021-02-22 ENCOUNTER — Encounter: Payer: Self-pay | Admitting: Podiatry

## 2021-02-22 ENCOUNTER — Other Ambulatory Visit: Payer: Self-pay

## 2021-02-22 ENCOUNTER — Ambulatory Visit (INDEPENDENT_AMBULATORY_CARE_PROVIDER_SITE_OTHER): Payer: Medicare Other | Admitting: Podiatry

## 2021-02-22 DIAGNOSIS — E119 Type 2 diabetes mellitus without complications: Secondary | ICD-10-CM | POA: Diagnosis not present

## 2021-02-22 DIAGNOSIS — M79675 Pain in left toe(s): Secondary | ICD-10-CM | POA: Diagnosis not present

## 2021-02-22 DIAGNOSIS — B351 Tinea unguium: Secondary | ICD-10-CM

## 2021-02-22 DIAGNOSIS — M79674 Pain in right toe(s): Secondary | ICD-10-CM | POA: Diagnosis not present

## 2021-02-22 NOTE — Progress Notes (Signed)
This patient returns to my office for at risk foot care.  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  This patient is unable to cut nails himself since the patient cannot reach his nails.These nails are painful walking and wearing shoes.  This patient presents for at risk foot care today.  General Appearance  Alert, conversant and in no acute stress.  Vascular  Dorsalis pedis and posterior tibial  pulses are palpable  bilaterally.  Capillary return is within normal limits  bilaterally. Temperature is within normal limits  bilaterally.  Neurologic  Senn-Weinstein monofilament wire test within normal limits  bilaterally. Muscle power within normal limits bilaterally.  Nails Thick disfigured discolored nails with subungual debris  from hallux to fifth toes right foot. No evidence of bacterial infection or drainage bilaterally.  Orthopedic .  No crepitus or effusions noted.  No bony pathology or digital deformities noted. No rearfoot motion STJ and ankle  right foot.  Skin  normotropic skin with no porokeratosis noted bilaterally.  No signs of infections or ulcers noted.     Onychomycosis  Pain in right toes  Pain in left toes  Consent was obtained for treatment procedures.   Mechanical debridement of nails 1-5  bilaterally performed with a nail nipper.  Filed with dremel without incident.    Return office visit   4 months                   Told patient to return for periodic foot care and evaluation due to potential at risk complications.   Gardiner Barefoot DPM

## 2021-02-24 ENCOUNTER — Other Ambulatory Visit: Payer: Self-pay | Admitting: Urology

## 2021-02-26 ENCOUNTER — Other Ambulatory Visit: Payer: Self-pay

## 2021-03-07 ENCOUNTER — Ambulatory Visit (INDEPENDENT_AMBULATORY_CARE_PROVIDER_SITE_OTHER): Payer: Medicare Other | Admitting: Plastic Surgery

## 2021-03-07 ENCOUNTER — Other Ambulatory Visit: Payer: Self-pay

## 2021-03-07 DIAGNOSIS — C4442 Squamous cell carcinoma of skin of scalp and neck: Secondary | ICD-10-CM | POA: Diagnosis not present

## 2021-03-07 NOTE — Progress Notes (Signed)
   Referring Provider [name], MD [place],  [place] [id number]   CC:  Chief Complaint  Patient presents with   Cory [name] is an 80 y.o. male.  HPI: Patient presents about 6 weeks out from Integra placement for scalp wound after Mohs excision.  He feels like things are going okay.  He still doing dressing changes every day and continuing to take showers and wash the area.  Review of Systems General: Denies fevers and chills  Physical Exam Vitals with BMI 01/29/2021 01/19/2021 01/19/2021  Height 5\' 6"  - -  Weight 164 lbs 4 oz - -  BMI 20.23 - -  Systolic 343 568 616  Diastolic 62 66 64  Pulse 71 95 88    General:  No acute distress,  Alert and oriented, Non-Toxic, Normal speech and affect I removed the silicone layer today over the Integra.  This revealed significant granulation tissue and what appears to be a complete take.  No obvious exposed bone.  No signs of infection.  Assessment/Plan Patient demonstrates good take of Integra over the bone.  We will plan to continue wound care over this area for another few weeks.  I offered skin graft to him and he is going to think about that and we can readdress it at his next visit.  All of his questions were answered.  Cindra Presume 03/07/2021, 5:34 PM

## 2021-03-08 ENCOUNTER — Ambulatory Visit: Payer: Medicare Other | Admitting: Surgical

## 2021-03-13 ENCOUNTER — Other Ambulatory Visit: Payer: Self-pay

## 2021-03-13 ENCOUNTER — Ambulatory Visit (INDEPENDENT_AMBULATORY_CARE_PROVIDER_SITE_OTHER): Payer: Medicare Other

## 2021-03-13 DIAGNOSIS — I6523 Occlusion and stenosis of bilateral carotid arteries: Secondary | ICD-10-CM | POA: Diagnosis not present

## 2021-03-13 DIAGNOSIS — I779 Disorder of arteries and arterioles, unspecified: Secondary | ICD-10-CM | POA: Diagnosis not present

## 2021-03-15 ENCOUNTER — Other Ambulatory Visit: Payer: Self-pay

## 2021-03-15 DIAGNOSIS — I6521 Occlusion and stenosis of right carotid artery: Secondary | ICD-10-CM

## 2021-03-19 ENCOUNTER — Other Ambulatory Visit: Payer: Self-pay | Admitting: Family Medicine

## 2021-03-20 ENCOUNTER — Ambulatory Visit (INDEPENDENT_AMBULATORY_CARE_PROVIDER_SITE_OTHER): Payer: Medicare Other | Admitting: Cardiovascular Disease

## 2021-03-20 ENCOUNTER — Encounter: Payer: Self-pay | Admitting: Family Medicine

## 2021-03-20 ENCOUNTER — Encounter: Payer: Self-pay | Admitting: Cardiovascular Disease

## 2021-03-20 ENCOUNTER — Other Ambulatory Visit: Payer: Self-pay

## 2021-03-20 VITALS — BP 158/70 | HR 65 | Ht 65.0 in | Wt 165.4 lb

## 2021-03-20 DIAGNOSIS — I2510 Atherosclerotic heart disease of native coronary artery without angina pectoris: Secondary | ICD-10-CM

## 2021-03-20 DIAGNOSIS — I6521 Occlusion and stenosis of right carotid artery: Secondary | ICD-10-CM | POA: Diagnosis not present

## 2021-03-20 DIAGNOSIS — E785 Hyperlipidemia, unspecified: Secondary | ICD-10-CM

## 2021-03-20 DIAGNOSIS — I10 Essential (primary) hypertension: Secondary | ICD-10-CM

## 2021-03-20 DIAGNOSIS — I739 Peripheral vascular disease, unspecified: Secondary | ICD-10-CM

## 2021-03-20 MED ORDER — FLUTICASONE PROPIONATE 50 MCG/ACT NA SUSP: 1.0000 | Freq: Every day | NASAL | 3 refills | Status: DC

## 2021-03-20 NOTE — Patient Instructions (Signed)

## 2021-03-20 NOTE — Progress Notes (Signed)
Cardiology Office Note   Date:  03/20/2021   ID:  Cory [name], DOB Oct 14, 1940, MRN [id number]  PCP:  [name], MD  Cardiologist:  Dr. Martinique but transferring to [facility] office.  Chief Complaint  Patient presents with   OTHER    Post carotid U/s c/o chest discomfort. Meds reviewed verbally with pt.      History of Present Illness: Cory [name] is a 80 y.o. male who presents for a follow-up visit regarding peripheral arterial disease and coronary artery disease.   He has known history of coronary artery disease status post CABG, history of CVA, essential hypertension, hyperlipidemia, type 2 diabetes and history of vasovagal syncope.  His CABG was in 2000.  He had PCI of SVG to OM in 0240 complicated by aphasia and confusion but negative stroke work-up. 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  He was hospitalized at [facility] in January of this year with expressive aphasia.  CT was negative.  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  Carotid Doppler showed 60% right carotid stenosis.  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 He was seen for left calf claudication.  Lower extremity arterial Doppler done in July showed an ABI of 1.04 on the right and 0.71 on the left.  Duplex showed significant stenosis in the left popliteal artery with scant flow in the proximal SFA.  Subtotal occlusion could not be ruled out.  I advised him to start a walking exercise program.  He has been getting physical therapy and reports gradual improvement in left calf claudication.  He reports that the discomfort is now minimal.  Since his last visit, he reports 2 episodes of chest pain.  One of the episodes responded to nitroglycerin.  He had recent surgery on his head related to squamous cell carcinoma.  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     amLODipine (NORVASC) 5 MG tablet TAKE 1 TABLET EVERY DAY 90 tablet 1   aspirin EC 81 MG tablet Take 1 tablet (81 mg total) by mouth daily. Swallow whole. 90 tablet 3   atorvastatin (LIPITOR) 40 MG tablet TAKE 1 TABLET EVERY DAY 90 tablet 0   cloNIDine (CATAPRES) 0.1 MG tablet Take 1 tablet (0.1 mg  total) by mouth 2 (two) times daily. 180 tablet 3   clopidogrel (PLAVIX) 75 MG tablet TAKE 1 TABLET EVERY DAY 90 tablet 3   cycloSPORINE (RESTASIS) 0.05 % ophthalmic emulsion 1 drop 2 (two) times a day.     diclofenac Sodium (VOLTAREN) 1 % GEL Apply 2 g topically as needed.     escitalopram (LEXAPRO) 5 MG tablet Take 10 mg by mouth daily.     famotidine (PEPCID) 20 MG tablet Take 20 mg by mouth at bedtime.     fluticasone (FLONASE) 50 MCG/ACT nasal spray 1 spray by Each Nare route nightly.     GEMTESA 75 MG TABS TAKE 1 TABLET EVERY DAY AT 6:00 AM 90 tablet 1   Iron, Ferrous Sulfate, 325 (65 Fe) MG TABS Take 325 mg by mouth daily. 90 tablet 1   latanoprost (XALATAN) 0.005 % ophthalmic  solution daily.     lisinopril (ZESTRIL) 10 MG tablet TAKE 1 TABLET (10 MG TOTAL) BY MOUTH DAILY. 90 tablet 3   melatonin 5 MG TABS Take 5 mg by mouth at bedtime.     metFORMIN (GLUCOPHAGE) 500 MG tablet Take 1 tablet (500 mg total) by mouth 2 (two) times daily with a meal. 180 tablet 0   montelukast (SINGULAIR) 10 MG tablet TAKE 1 TABLET (10 MG TOTAL) BY MOUTH AT BEDTIME. 90 tablet 3   nitroGLYCERIN (NITROSTAT) 0.4 MG SL tablet Place 1 tablet (0.4 mg total) under the tongue every 5 (five) minutes as needed. 5 tablet 0   pantoprazole (PROTONIX) 40 MG tablet Take 1 tablet (40 mg total) by mouth 2 (two) times daily. (Patient taking differently: Take 40 mg by mouth daily.) 180 tablet 3   tamsulosin (FLOMAX) 0.4 MG CAPS capsule Take 1 capsule (0.4 mg total) by mouth daily. 90 capsule 3   No current facility-administered medications for this visit.    Allergies:   Fesoterodine, Isosorbide, Tramadol, Grass pollen(k-o-r-t-swt vern), Hydrocodone, Meperidine, and Penicillins    Social History:  The patient  reports that he has quit smoking. His smoking use included pipe. He has never used smokeless tobacco. He reports that he does not drink alcohol and does not use drugs.   Family History:  The patient's family history includes Heart Problems in his father and mother; Heart attack in his father; Heart disease in his brother and mother.    ROS:  Please see the history of present illness.   Otherwise, review of systems are positive for none.   All other systems are reviewed and negative.    PHYSICAL EXAM: VS:  BP (!) 158/70 (BP Location: Left Arm, Patient Position: Sitting, Cuff Size: Normal)   Pulse 65   Ht 5\' 5"  (1.651 m)   Wt 165 lb 6 oz (75 kg)   SpO2 98%   BMI 27.52 kg/m  , BMI Body mass index is 27.52 kg/m. GEN: Well nourished, well developed, in no acute distress  HEENT: normal  Neck: no JVD, carotid bruits, or masses Cardiac: RRR; no rubs, or gallops,no edema .  2/6 systolic murmur in  the aortic area Respiratory:  clear to auscultation bilaterally, normal work of breathing GI: soft, nontender, nondistended, + BS MS: no deformity or atrophy  Skin: warm and dry, no rash Neuro:  Strength and sensation are intact Psych: euthymic mood, full affect   EKG:  EKG is ordered today. The ekg ordered today demonstrates sinus rhythm with first-degree heart block and old inferior infarct.  No new changes.   Recent Labs:  05/07/2020: Magnesium 2.0 01/29/2021: ALT 43; BUN 15; Creatinine, Ser 1.05; Hemoglobin 10.7; Platelets 237.0; Potassium 4.5; Sodium 129    Lipid Panel    Component Value Date/Time   CHOL 120 01/29/2021 1530   CHOL 137 03/13/2020 1123   TRIG 206.0 (H) 01/29/2021 1530   HDL 44.20 01/29/2021 1530   HDL 47 03/13/2020 1123   CHOLHDL 3 01/29/2021 1530   VLDL 41.2 (H) 01/29/2021 1530   LDLCALC 56 05/07/2020 0512   LDLCALC 65 03/13/2020 1123   LDLDIRECT 51.0 01/29/2021 1530      Wt Readings from Last 3 Encounters:  03/20/21 165 lb 6 oz (75 kg)  01/29/21 164 lb 4 oz (74.5 kg)  01/19/21 166 lb 7.2 oz (75.5 kg)       PAD Screen 12/08/2020  Previous PAD dx? No  Previous surgical procedure? Yes  Dates of procedures Hx GABG/Cardiac cath  Pain with walking? Yes  Subsides with rest? Yes  Feet/toe relief with dangling? No  Painful, non-healing ulcers? No  Extremities discolored? No      ASSESSMENT AND PLAN:  1.  Peripheral arterial disease: The patient now reports improvement in left calf claudication.  His symptoms currently are not lifestyle limiting and thus I recommend continuing medical therapy.    2.  Moderate calcified right carotid stenosis: I reviewed the results of recent carotid Doppler which showed stable moderate right carotid stenosis.  Repeat study in November 2023.  Continue treatment of risk factors.   3.  Coronary artery disease involving native coronary artery status post CABG: Currently with no anginal symptoms.  Previous PCI and SVG to  OM.  Currently on dual antiplatelet therapy for both his coronary artery disease as well as recurrent strokes.  He reports having 2 episodes of chest pain in the last month but they were short-lived and currently with no exertional symptoms.  EKG does not show any ischemic changes.  Recommend continuing medical therapy.  Ischemic evaluation can be considered if symptoms become more frequent and consistent.  4.  Essential hypertension: Blood pressure is elevated today but he reports significant fluctuation in blood pressure.  Continue to monitor closely and if blood pressure continues to be on the high side, we will plan on increasing the dose of lisinopril.  5.  Hyperlipidemia: I reviewed most recent labs done in October which showed an LDL of 51.  This is at target.  Continue atorvastatin 40 mg daily.    Disposition:   FU with me in 6 months  Signed,  Kathlyn Sacramento, MD  03/20/2021 2:36 PM    [facility]

## 2021-03-28 ENCOUNTER — Ambulatory Visit: Payer: Medicare Other | Admitting: Surgical

## 2021-03-29 ENCOUNTER — Other Ambulatory Visit: Payer: Self-pay | Admitting: Family Medicine

## 2021-03-29 DIAGNOSIS — K449 Diaphragmatic hernia without obstruction or gangrene: Secondary | ICD-10-CM

## 2021-04-02 ENCOUNTER — Ambulatory Visit (INDEPENDENT_AMBULATORY_CARE_PROVIDER_SITE_OTHER): Payer: Medicare Other | Admitting: Dermatology

## 2021-04-02 ENCOUNTER — Other Ambulatory Visit: Payer: Self-pay

## 2021-04-02 DIAGNOSIS — D692 Other nonthrombocytopenic purpura: Secondary | ICD-10-CM

## 2021-04-02 DIAGNOSIS — I6521 Occlusion and stenosis of right carotid artery: Secondary | ICD-10-CM | POA: Diagnosis not present

## 2021-04-02 DIAGNOSIS — L570 Actinic keratosis: Secondary | ICD-10-CM

## 2021-04-02 DIAGNOSIS — Z85828 Personal history of other malignant neoplasm of skin: Secondary | ICD-10-CM

## 2021-04-02 DIAGNOSIS — L578 Other skin changes due to chronic exposure to nonionizing radiation: Secondary | ICD-10-CM

## 2021-04-02 DIAGNOSIS — L820 Inflamed seborrheic keratosis: Secondary | ICD-10-CM | POA: Diagnosis not present

## 2021-04-02 NOTE — Patient Instructions (Addendum)
Actinic keratoses are precancerous spots that appear secondary to cumulative UV radiation exposure/sun exposure over time. They are chronic with expected duration over 1 year. A portion of actinic keratoses will progress to squamous cell carcinoma of the skin. It is not possible to reliably predict which spots will progress to skin cancer and so treatment is recommended to prevent development of skin cancer.  Recommend daily broad spectrum sunscreen SPF 30+ to sun-exposed areas, reapply every 2 hours as needed.  Recommend staying in the shade or wearing long sleeves, sun glasses (UVA+UVB protection) and wide brim hats (4-inch brim around the entire circumference of the hat). Call for new or changing lesions.   Cryotherapy Aftercare  Wash gently with soap and water everyday.   Apply Vaseline and Band-Aid daily until healed.         If You Need Anything After Your Visit  If you have any questions or concerns for your doctor, please call our main line at [phone] and press option 4 to reach your doctor's medical assistant. If no one answers, please leave a voicemail as directed and we will return your call as soon as possible. Messages left after 4 pm will be answered the following business day.   You may also send [place] a message via [place]. We typically respond to MyChart messages within 1-2 business days.  For prescription refills, please ask your pharmacy to contact our office. Our fax number is [phone].  If you have an urgent issue when the clinic is closed that cannot wait until the next business day, you can page your doctor at the number below.    Please note that while we do our best to be available for urgent issues outside of office hours, we are not available 24/7.   If you have an urgent issue and are unable to reach [place], you may choose to seek medical care at your doctor's office, retail clinic, urgent care center, or emergency room.  If you have a medical emergency,  please immediately call 911 or go to the emergency department.  Pager Numbers  - Dr. Nehemiah Massed: [phone]  - Dr. Laurence Ferrari: [phone]  - Dr. Nicole Kindred: [phone]  In the event of inclement weather, please call our main line at [phone] for an update on the status of any delays or closures.  Dermatology Medication Tips: Please keep the boxes that topical medications come in in order to help keep track of the instructions about where and how to use these. Pharmacies typically print the medication instructions only on the boxes and not directly on the medication tubes.   If your medication is too expensive, please contact our office at [phone] option 4 or send [place] a message through [place].   We are unable to tell what your co-pay for medications will be in advance as this is different depending on your insurance coverage. However, we may be able to find a substitute medication at lower cost or fill out paperwork to get insurance to cover a needed medication.   If a prior authorization is required to get your medication covered by your insurance company, please allow [place] 1-2 business days to complete this process.  Drug prices often vary depending on where the prescription is filled and some pharmacies may offer cheaper prices.  The website www.goodrx.com contains coupons for medications through different pharmacies. The prices here do not account for what the cost may be with help from insurance (it may be cheaper with your insurance), but the website can  give you the price if you did not use any insurance.  - You can print the associated coupon and take it with your prescription to the pharmacy.  - You may also stop by our office during regular business hours and pick up a GoodRx coupon card.  - If you need your prescription sent electronically to a different pharmacy, notify our office through [facility] or by phone at [phone] option 4.     Si Usted Necesita Algo  Despus de Su Visita  Tambin puede enviarnos un mensaje a travs de Pharmacist, community. Por lo general respondemos a los mensajes de MyChart en el transcurso de 1 a 2 das hbiles.  Para renovar recetas, por favor pida a su farmacia que se ponga en contacto con nuestra oficina. Harland Dingwall de fax es [place] [phone].  Si tiene un asunto urgente cuando la clnica est cerrada y que no puede esperar hasta el siguiente da hbil, puede llamar/localizar a su doctor(a) al nmero que aparece a continuacin.   Por favor, tenga en cuenta que aunque hacemos todo lo posible para estar disponibles para asuntos urgentes fuera del horario de [place], no estamos disponibles las 24 horas del da, los 7 das de la [place].   Si tiene un problema urgente y no puede comunicarse con nosotros, puede optar por buscar atencin mdica  en el consultorio de su doctor(a), en una clnica privada, en un centro de atencin urgente o en una sala de emergencias.  Si tiene Engineering geologist, por favor llame inmediatamente al 911 o vaya a la sala de emergencias.  Nmeros de bper  - Dr. Nehemiah Massed: [phone]  - Dra. Moye: [phone]  - Dra. Nicole Kindred: [phone]  En caso de inclemencias del [place], por favor llame a Johnsie Kindred principal al [phone] para una actualizacin sobre el [place] de cualquier retraso o cierre.  Consejos para la medicacin en dermatologa: Por favor, guarde las cajas en las que vienen los medicamentos de uso tpico para ayudarle a seguir las instrucciones sobre dnde y cmo usarlos. Las farmacias generalmente imprimen las instrucciones del medicamento slo en las cajas y no directamente en los tubos del [place].   Si su medicamento es muy caro, por favor, pngase en contacto con Zigmund Daniel llamando al [phone] y presione la opcin 4 o envenos un mensaje a travs de Pharmacist, community.   No podemos decirle cul ser su copago por los medicamentos por adelantado ya que esto es diferente  dependiendo de la cobertura de su seguro. Sin embargo, es posible que podamos encontrar un medicamento sustituto a Electrical engineer un formulario para que el seguro cubra el medicamento que se considera necesario.   Si se requiere una autorizacin previa para que su compaa de seguros [place] su medicamento, por favor permtanos de 1 a 2 das hbiles para completar este proceso.  Los precios de los medicamentos varan con frecuencia dependiendo del Environmental consultant de dnde se surte la receta y alguna farmacias pueden ofrecer precios ms baratos.  El sitio web www.goodrx.com tiene cupones para medicamentos de Airline pilot. Los precios aqu no tienen en cuenta lo que podra costar con la ayuda del seguro (puede ser ms barato con su seguro), pero el sitio web puede darle el precio si no utiliz Research scientist (physical sciences).  - Puede imprimir el cupn correspondiente y llevarlo con su receta a la farmacia.  - Tambin puede pasar por nuestra oficina durante el horario de atencin regular y Charity fundraiser una tarjeta de cupones de GoodRx.  -  Si necesita que su receta se enve electrnicamente a una farmacia diferente, informe a nuestra oficina a travs de MyChart de [place] o por telfono llamando al [phone] y presione la opcin 4.  

## 2021-04-02 NOTE — Progress Notes (Signed)
Follow-Up Visit   Subjective  Cory [name] is a 80 y.o. male who presents for the following: Follow-up (Patient here today for 3 month ak follow up and recheck. ).The patient has spots, moles and lesions to be evaluated, some may be new or changing and the patient has concerns that these could be cancer.  The following portions of the chart were reviewed this encounter and updated as appropriate:  Tobacco  Allergies  Meds  Problems  Med Hx  Surg Hx  Fam Hx     Review of Systems: No other skin or systemic complaints except as noted in HPI or Assessment and Plan.  Objective  Well appearing patient in no apparent distress; mood and affect are within normal limits.  A focused examination was performed including face, scalp, ears, back. Relevant physical exam findings are noted in the Assessment and Plan.  left nose, left temple, left temple, face x 15 (15) Erythematous thin papules/macules with gritty scale.   Scalp 4.5 x 2      back x 1 Erythematous keratotic or waxy stuck-on papule or plaque.   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 They are chronic with expected duration over 1 year. A portion of actinic keratoses will progress to squamous cell carcinoma of the skin. It is not possible to reliably predict which spots will progress to skin cancer and so treatment is recommended to prevent development of skin cancer. Recommend daily broad spectrum sunscreen SPF 30+ to sun-exposed areas, reapply every 2 hours as needed.  Recommend staying in the shade or wearing long sleeves, sun glasses (UVA+UVB protection) and wide brim hats (4-inch brim around the entire circumference of the hat). Call for new or changing lesions. Destruction of lesion - left  nose, left temple, left temple, face x 15 Complexity: simple   Destruction method: cryotherapy   Informed consent: discussed and consent obtained   Timeout:  patient name, date of birth, surgical site, and procedure verified Lesion destroyed using liquid nitrogen: Yes   Region frozen until ice ball extended beyond lesion: Yes   Outcome: patient tolerated procedure well with no complications   Post-procedure details: wound care instructions given   Additional details:  Prior to procedure, discussed risks of blister formation, small wound, skin dyspigmentation, or rare scar following cryotherapy. Recommend Vaseline ointment to treated areas while healing.  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 Clear. Observe for recurrence. Call clinic for new or changing lesions.  Recommend regular skin exams, daily broad-spectrum spf 30+ sunscreen use, and photoprotection.    Inflamed seborrheic keratosis back x 1 Destruction of lesion - back x 1 Complexity: simple   Destruction method: cryotherapy   Informed consent: discussed and consent obtained   Timeout:  patient name, date of birth, surgical site, and procedure verified Lesion destroyed using liquid nitrogen: Yes   Region frozen until ice ball extended beyond lesion: Yes   Outcome: patient tolerated procedure well with no complications   Post-procedure details: wound care instructions given   Additional details:  Prior to procedure, discussed risks of blister formation, small wound, skin dyspigmentation, or rare scar following cryotherapy. Recommend Vaseline ointment to treated areas while healing.  Purpura - Chronic; persistent and recurrent.  Treatable, but not curable. - Violaceous macules and patches - Benign - Related to trauma, age, sun damage and/or use of blood thinners, chronic use of topical and/or oral steroids - Observe - Can use OTC arnica containing  moisturizer such as Dermend Bruise Formula if desired - Call for worsening or other concerns  History of Squamous Cell Carcinoma of the Skin - No evidence of recurrence today - No lymphadenopathy - Recommend regular full body skin exams - Recommend daily broad spectrum sunscreen SPF 30+ to sun-exposed areas, reapply every 2 hours as needed.  - Call if any new or changing lesions are noted between office visits  Actinic Damage - chronic, secondary to cumulative UV radiation exposure/sun exposure over time - diffuse scaly erythematous macules with underlying dyspigmentation - Recommend daily broad spectrum sunscreen SPF 30+ to sun-exposed areas, reapply every 2 hours as needed.  - Recommend staying in the shade or wearing long sleeves, sun glasses (UVA+UVB protection) and wide brim hats (4-inch brim around the entire circumference of the hat). - Call for new or changing lesions.  Return for 2 month ak followup. IRuthell Rummage, CMA, am acting as scribe for Sarina Ser, MD. Documentation: I have reviewed the above documentation for accuracy and completeness, and I agree with the above.  Sarina Ser, MD

## 2021-04-04 ENCOUNTER — Emergency Department: Payer: Medicare Other

## 2021-04-04 ENCOUNTER — Other Ambulatory Visit: Payer: Self-pay

## 2021-04-04 ENCOUNTER — Emergency Department
Admission: EM | Admit: 2021-04-04 | Discharge: 2021-04-05 | Disposition: A | Discharge status: Home / Self Care | Payer: Medicare Other | Attending: Emergency Medicine | Admitting: Emergency Medicine

## 2021-04-04 DIAGNOSIS — Z951 Presence of aortocoronary bypass graft: Secondary | ICD-10-CM | POA: Diagnosis not present

## 2021-04-04 DIAGNOSIS — R55 Syncope and collapse: Secondary | ICD-10-CM | POA: Insufficient documentation

## 2021-04-04 DIAGNOSIS — Z7982 Long term (current) use of aspirin: Secondary | ICD-10-CM | POA: Insufficient documentation

## 2021-04-04 DIAGNOSIS — Z79899 Other long term (current) drug therapy: Secondary | ICD-10-CM | POA: Diagnosis not present

## 2021-04-04 DIAGNOSIS — Z20822 Contact with and (suspected) exposure to covid-19: Secondary | ICD-10-CM | POA: Insufficient documentation

## 2021-04-04 DIAGNOSIS — I10 Essential (primary) hypertension: Secondary | ICD-10-CM | POA: Diagnosis not present

## 2021-04-04 DIAGNOSIS — Z7984 Long term (current) use of oral hypoglycemic drugs: Secondary | ICD-10-CM | POA: Insufficient documentation

## 2021-04-04 DIAGNOSIS — I2510 Atherosclerotic heart disease of native coronary artery without angina pectoris: Secondary | ICD-10-CM | POA: Diagnosis not present

## 2021-04-04 DIAGNOSIS — Z23 Encounter for immunization: Secondary | ICD-10-CM | POA: Diagnosis not present

## 2021-04-04 DIAGNOSIS — Z85828 Personal history of other malignant neoplasm of skin: Secondary | ICD-10-CM | POA: Diagnosis not present

## 2021-04-04 DIAGNOSIS — Z87891 Personal history of nicotine dependence: Secondary | ICD-10-CM | POA: Insufficient documentation

## 2021-04-04 DIAGNOSIS — R531 Weakness: Secondary | ICD-10-CM | POA: Diagnosis not present

## 2021-04-04 DIAGNOSIS — Z7902 Long term (current) use of antithrombotics/antiplatelets: Secondary | ICD-10-CM | POA: Insufficient documentation

## 2021-04-04 DIAGNOSIS — E119 Type 2 diabetes mellitus without complications: Secondary | ICD-10-CM | POA: Diagnosis not present

## 2021-04-04 LAB — URINALYSIS, ROUTINE W REFLEX MICROSCOPIC
Bacteria, UA: NONE SEEN
Bilirubin Urine: NEGATIVE
Glucose, UA: NEGATIVE mg/dL
Ketones, ur: NEGATIVE mg/dL
Leukocytes,Ua: NEGATIVE
Nitrite: NEGATIVE
Protein, ur: NEGATIVE mg/dL
Specific Gravity, Urine: 1.02 (ref 1.005–1.030)
pH: 7 (ref 5.0–8.0)

## 2021-04-04 LAB — CBC
HCT: 38.4 % — ABNORMAL LOW (ref 39.0–52.0)
Hemoglobin: 12.6 g/dL — ABNORMAL LOW (ref 13.0–17.0)
MCH: 29.3 pg (ref 26.0–34.0)
MCHC: 32.8 g/dL (ref 30.0–36.0)
MCV: 89.3 fL (ref 80.0–100.0)
Platelets: 199 10*3/uL (ref 150–400)
RBC: 4.3 MIL/uL (ref 4.22–5.81)
RDW: 15.6 % — ABNORMAL HIGH (ref 11.5–15.5)
WBC: 9.7 10*3/uL (ref 4.0–10.5)
nRBC: 0 % (ref 0.0–0.2)

## 2021-04-04 LAB — TROPONIN I (HIGH SENSITIVITY)
Troponin I (High Sensitivity): 10 ng/L (ref <18)
Troponin I (High Sensitivity): 9 ng/L (ref <18)

## 2021-04-04 LAB — BASIC METABOLIC PANEL
Anion gap: 11 (ref 5–15)
BUN: 13 mg/dL (ref 8–23)
CO2: 18 mmol/L — ABNORMAL LOW (ref 22–32)
Calcium: 9.1 mg/dL (ref 8.9–10.3)
Chloride: 99 mmol/L (ref 98–111)
Creatinine, Ser: 0.96 mg/dL (ref 0.61–1.24)
GFR, Estimated: >60 mL/min (ref >60)
Glucose, Bld: 111 mg/dL — ABNORMAL HIGH (ref 70–99)
Potassium: 4.3 mmol/L (ref 3.5–5.1)
Sodium: 128 mmol/L — ABNORMAL LOW (ref 135–145)

## 2021-04-04 LAB — RESP PANEL BY RT-PCR (FLU A&B, COVID) ARPGX2
Influenza A by PCR: NEGATIVE
Influenza B by PCR: NEGATIVE
SARS Coronavirus 2 by RT PCR: NEGATIVE

## 2021-04-04 MED ORDER — SODIUM CHLORIDE 0.9 % IV BOLUS
500.0000 mL | Freq: Once | INTRAVENOUS | Status: AC
  Administered 2021-04-04: 500 mL via INTRAVENOUS

## 2021-04-04 MED ORDER — TETANUS-DIPHTH-ACELL PERTUSSIS 5-2.5-18.5 LF-MCG/0.5 IM SUSY
0.5000 mL | PREFILLED_SYRINGE | Freq: Once | INTRAMUSCULAR | Status: AC
  Administered 2021-04-04: 0.5 mL via INTRAMUSCULAR
  Filled 2021-04-04: qty 0.5

## 2021-04-04 NOTE — ED Provider Notes (Signed)
Emergency Medicine Provider Triage Evaluation Note  Cory [name] , a 80 y.o. male  was evaluated in triage.  Pt complains of near syncopal episode and lightheadedness that occurred while he was on the commode and episode of diarrhea.  Denies any chest pain or pressure did not hit his head.  No numbness or tingling..  Review of Systems  Positive: diarrhea Negative: Chest pain  Physical Exam  BP 138/74 (BP Location: Left Arm)   Pulse (!) 58   Temp 97.6 F (36.4 C) (Oral)   Resp 16   Ht 5\' 5"  (1.651 m)   Wt 75 kg   SpO2 100%   BMI 27.51 kg/m  Gen:   Awake, no distress   Resp:  Normal effort  MSK:   Moves extremities without difficulty  Other:    Medical Decision Making  Medically screening exam initiated at 1:43 PM.  Appropriate orders placed.  Cory [name] was informed that the remainder of the evaluation will be completed by another provider, this initial triage assessment does not replace that evaluation, and the importance of remaining in the ED until their evaluation is complete.     Merlyn Lot, MD 04/04/21 1344

## 2021-04-04 NOTE — ED Triage Notes (Addendum)
Pt states that he was feeling the urge to have a bowel movement, states as he was sitting on the toilet he began to feel hot, sweaty, and vomited and defecated, pt states that he cont to feel weak, pt has a skin tear to his right elbow and a previous bandage to his head from a skin procedure  Initial bp with fire dept was 98 systolic Cbg 915

## 2021-04-04 NOTE — ED Provider Notes (Signed)
[facility] Emergency Department Provider Note  ____________________________________________   Event Date/Time   First MD Initiated Contact with Patient 04/04/21 2133     (approximate)  I have reviewed the triage vital signs and the nursing notes.   HISTORY  Chief Complaint Near Syncope and Weakness    HPI Cory [name] is a 80 y.o. male with diabetes, hypertension, hyperlipidemia, stroke on Plavix who comes in with concerns for near syncope.  Patient reports that he is having the urge to have a bowel movement.  He was sitting on the toilet when he started to feel like he was going to pass out.  Did not fully lose consciousness but he fell off the toilet onto his left side and then had an episode of vomiting and diarrhea.  He states that he felt extremely weak and felt like he could not get up so he had a family member called EMS.  He was not on the ground for prolonged period of time.  He states that he is got some abrasions on his right elbow but he is not sure if he hit his head or not.  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   Left scalp - EDC   Stroke ([place])    on Plavix and ASA   Vasovagal syncope     Patient Active Problem List   Diagnosis Date Noted   Diabetes mellitus without complication ([place]) 17/79/3903   History of basal cell carcinoma (BCC) 08/30/2020   Trigger index finger of left hand 07/10/2020   Blurry vision 07/10/2020   Aneurysm ([place]) 05/11/2020   TIA (transient ischemic attack) 05/06/2020   Type 2  diabetes mellitus with other specified complication ([place]) 00/92/3300   Osteoarthritis 04/13/2020   Hypertension 04/13/2020   CAD (coronary artery disease), native coronary artery 04/13/2020   History of CVA with residual deficit 04/13/2020   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 ([place]) 04/13/2020   Hiatal hernia 04/13/2020   Seasonal allergies 04/13/2020   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 Authorizing Provider  acetaminophen (TYLENOL) 500 MG tablet Take 1,000 mg by mouth at bedtime.    [provider]  amLODipine (NORVASC) 5 MG tablet TAKE 1 TABLET EVERY DAY 02/01/21   [name], MD  aspirin EC 81 MG tablet Take 1 tablet (81 mg total) by mouth daily. Swallow whole.  03/13/20   [place], Peter M, MD  atorvastatin (LIPITOR) 40 MG tablet TAKE 1 TABLET EVERY DAY 01/14/21   [name], MD  cloNIDine (CATAPRES) 0.1 MG tablet Take 1 tablet (0.1 mg total) by mouth 2 (two) times daily. 02/16/21   [name], MD  clopidogrel (PLAVIX) 75 MG tablet TAKE 1 TABLET EVERY DAY 02/01/21   [name], MD  cycloSPORINE (RESTASIS) 0.05 % ophthalmic emulsion 1 drop 2 (two) times a day.    [provider]  diclofenac Sodium (VOLTAREN) 1 % GEL Apply 2 g topically as needed. 07/22/17   [provider]  escitalopram (LEXAPRO) 5 MG tablet Take 10 mg by mouth daily.    [provider]  famotidine (PEPCID) 20 MG tablet Take 20 mg  by mouth at bedtime.    [provider]  fluticasone (FLONASE) 50 MCG/ACT nasal spray Place 1 spray into both nostrils daily. 03/20/21   [name], MD  GEMTESA 75 MG TABS TAKE 1 TABLET EVERY DAY AT 6:00 AM 02/26/21   Hollice Espy, MD  Iron, Ferrous Sulfate, 325 (65 Fe) MG TABS Take 325 mg by mouth daily. 02/05/21   [name], MD  latanoprost (XALATAN) 0.005 % ophthalmic solution daily. 12/01/20   [provider]  lisinopril (ZESTRIL) 10 MG tablet TAKE 1 TABLET (10 MG TOTAL) BY MOUTH DAILY. 02/01/21   [name], MD  melatonin 5 MG TABS Take 5 mg by mouth at bedtime.    [provider]  metFORMIN (GLUCOPHAGE) 500 MG tablet Take 1 tablet (500 mg total) by mouth 2 (two) times daily with a meal. 02/01/21   [name], MD  montelukast (SINGULAIR) 10 MG tablet TAKE 1 TABLET (10 MG TOTAL) BY MOUTH AT BEDTIME. 02/22/21   [name], MD  nitroGLYCERIN (NITROSTAT) 0.4 MG SL tablet Place 1 tablet (0.4 mg total) under the tongue every 5 (five) minutes as needed. 10/10/20   [name], MD  pantoprazole (PROTONIX) 40 MG tablet Take 1 tablet (40 mg total) by mouth daily. 03/29/21   [name], MD  tamsulosin (FLOMAX) 0.4 MG CAPS capsule Take 1 capsule (0.4 mg total) by mouth daily. 11/16/20   Hollice Espy, MD    Allergies Fesoterodine, Isosorbide, Tramadol, Grass pollen([id number]-swt vern), Hydrocodone, Meperidine, and Penicillins  Family History  Problem Relation Age of Onset   Heart Problems Mother    Heart disease Mother    Heart Problems Father    Heart attack Father    Heart disease Brother     Social History Social History   Tobacco Use   Smoking status: Former    Types: Pipe   Smokeless tobacco: Never  Scientific laboratory technician Use: Never used  Substance Use Topics   Alcohol use: Never   Drug use: Never      Review of Systems Constitutional: No fever/chills Eyes: No visual changes. ENT: No sore throat. Cardiovascular: Denies  chest pain. Respiratory: Denies shortness of breath. Gastrointestinal: No abdominal pain.  No nausea, no vomiting.  No diarrhea.  No constipation. Genitourinary: Negative for dysuria. Musculoskeletal: Negative for back pain. Skin: Negative for rash. Neurological: Negative for headaches, focal weakness or numbness. All other ROS negative ____________________________________________   PHYSICAL EXAM:  VITAL SIGNS: ED Triage Vitals  Enc Vitals Group     BP 04/04/21 1337 138/74     Pulse Rate 04/04/21 1337 (!) 58     Resp 04/04/21 1337 16  Temp 04/04/21 1337 97.6 F (36.4 C)     Temp Source 04/04/21 1337 Oral     SpO2 04/04/21 1337 100 %     Weight 04/04/21 1338 165 lb 5.5 oz (75 kg)     Height 04/04/21 1338 5\' 5"  (1.651 m)     Head Circumference --      Peak Flow --      Pain Score 04/04/21 1338 0     Pain Loc --      Pain Edu? --      Excl. in [place]? --     Constitutional: Alert and oriented. Well appearing and in no acute distress. Eyes: Conjunctivae are normal. EOMI. Head: Atraumatic.  He has a dressing in the top of his head from where he had a lesion removed Nose: No congestion/rhinnorhea. Mouth/Throat: Mucous membranes are moist.   Neck: No stridor. Trachea Midline. FROM Cardiovascular: Normal rate, regular rhythm. Grossly normal heart sounds.  Good peripheral circulation. Respiratory: Normal respiratory effort.  No retractions. Lungs CTAB. Gastrointestinal: Soft and nontender. No distention. No abdominal bruits.  Musculoskeletal: Skin tear to the right elbow with lots of bruising noted.  No joint effusions. Neurologic:  Normal speech and language. No gross focal neurologic deficits are appreciated.  Equal strength in arms and legs.  A little bit of sensation change in the right leg which is baseline from his prior stroke.  Cranial nerves are intact otherwise Skin:  Skin is warm, dry and intact. No rash noted. Psychiatric: Mood and affect are normal. Speech and behavior  are normal. GU: Deferred   ____________________________________________   LABS (all labs ordered are listed, but only abnormal results are displayed)  Labs Reviewed  BASIC METABOLIC PANEL - Abnormal; Notable for the following components:      Result Value   Sodium 128 (*)    CO2 18 (*)    Glucose, Bld 111 (*)    All other components within normal limits  CBC - Abnormal; Notable for the following components:   Hemoglobin 12.6 (*)    HCT 38.4 (*)    RDW 15.6 (*)    All other components within normal limits  URINALYSIS, ROUTINE W REFLEX MICROSCOPIC - Abnormal; Notable for the following components:   APPearance CLEAR (*)    Hgb urine dipstick TRACE (*)    All other components within normal limits  CBG MONITORING, ED   ____________________________________________   ED ECG REPORT I, Vanessa [place], the attending physician, personally viewed and interpreted this ECG.  Normal sinus rate of 62, no ST elevation, type I AV block   RADIOLOGY I, Vanessa Fergus, personally viewed and evaluated these images (plain radiographs) as part of my medical decision making, as well as reviewing the written report by the radiologist.  ED MD interpretation: No fracture noted  Official radiology report(s): DG Elbow Complete Right  Result Date: 04/04/2021 CLINICAL DATA:  Fall and trauma to the right elbow. EXAM: RIGHT ELBOW - COMPLETE 3+ VIEW COMPARISON:  None. FINDINGS: There is no acute fracture or dislocation. The bones are osteopenic. No joint effusion. There is a 1 cm olecranon spur. The soft tissues are unremarkable. Vascular calcifications noted. IMPRESSION: No acute fracture or dislocation. Electronically Signed   By: Anner Crete M.D.   On: 04/04/2021 22:08    ____________________________________________   PROCEDURES  Procedure(s) performed (including Critical Care):  .1-3 Lead EKG Interpretation Performed by: Vanessa [place], MD Authorized by: Vanessa [place], MD     Interpretation:  normal     ECG rate:  60s   ECG rate assessment: normal     Rhythm: sinus rhythm     Ectopy: none     Conduction: normal     ____________________________________________   INITIAL IMPRESSION / ASSESSMENT AND PLAN / ED COURSE  [name] was evaluated in Emergency Department on 04/04/2021 for the symptoms described in the history of present illness. He was evaluated in the context of the global COVID-19 pandemic, which necessitated consideration that the patient might be at risk for infection with the SARS-CoV-2 virus that causes COVID-19. Institutional protocols and algorithms that pertain to the evaluation of patients at risk for COVID-19 are in a state of rapid change based on information released by regulatory bodies including the CDC and federal and state organizations. These policies and algorithms were followed during the patient's care in the ED.    Patient comes in for near syncopal episode.  I suspect this is most likely vasovagal but given patient's risk factors and age troponins were added on.  Labs to evaluate for electrolyte abnormalities, AKI.  We will keep patient on the cardiac monitor.  Initial EKG without evidence of arrhythmia.  Patient not sure if he hit his head.  No C-spine tenderness unlikely cervical injury.  No evidence of trauma but given patient on Plavix will get CT head to evaluate for any intracranial hemorrhage.  X-ray of his arm to make sure no fracture.  Nothing that appears to be needing suturing.  patient's abdomen is soft and nontender low suspicion for acute abdominal process.  He is  no shortness of breath low suspicion for PE.  Urine without evidence of UTI, sodium is low at 128 but that is where he normally runs for the past 2 months.  His bicarb is slightly low at 18 this could be from the episode of bowel movement..  Hemoglobin is stable, no evidence of infection  X-ray is negative.  Patient be handed off to oncoming team after fluids ambulation  trial.  If patient is feeling better I feel he can be reasonable for patient to go home if repeat labs are reassuring     ____________________________________________   FINAL CLINICAL IMPRESSION(S) / ED DIAGNOSES   Final diagnoses:  Vasovagal episode      MEDICATIONS GIVEN DURING THIS VISIT:  Medications  sodium chloride 0.9 % bolus 500 mL (has no administration in time range)  Tdap (BOOSTRIX) injection 0.5 mL (0.5 mLs Intramuscular Given 04/04/21 2220)     ED Discharge Orders     None        Note:  This document was prepared using Dragon voice recognition software and may include unintentional dictation errors.    Vanessa [place], MD 04/04/21 2231

## 2021-04-04 NOTE — ED Notes (Signed)
Pt ambulated around room with 1+ assist at this time.

## 2021-04-05 ENCOUNTER — Ambulatory Visit: Payer: Medicare Other | Admitting: Surgical

## 2021-04-05 NOTE — ED Provider Notes (Signed)
12:05 AM  Assumed care at shift change.  Patient here after what sounds like a vasovagal episode.  Plan was to follow-up on repeat troponin, repeat vital signs and p.o. challenge and ambulate patient.  Second troponin negative.  He continues to be asymptomatic.  Vital signs show mild hypertension but otherwise reassuring.  Able to tolerate p.o. and ambulate without difficulty or becoming symptomatic.  He denies any preceding chest pain, shortness of breath or palpitations that led to his symptoms.  He has had an episode of vasovagal syncope before.  He feels comfortable with plan for discharge home.  Recommended close follow-up with his primary care doctor.  Discussed return precautions.  At this time, I do not feel there is any life-threatening condition present. I have reviewed, interpreted and discussed all results (EKG, imaging, lab, urine as appropriate) and exam findings with patient/family. I have reviewed nursing notes and appropriate previous records.  I feel the patient is safe to be discharged home without further emergent workup and can continue workup as an outpatient as needed. Discussed usual and customary return precautions. Patient/family verbalize understanding and are comfortable with this plan.  Outpatient follow-up has been provided as needed. All questions have been answered.    Marieme Mcmackin, Delice Bison, DO 04/05/21 0006

## 2021-04-11 ENCOUNTER — Encounter: Payer: Self-pay | Admitting: Dermatology

## 2021-04-15 ENCOUNTER — Other Ambulatory Visit: Payer: Self-pay | Admitting: Family Medicine

## 2021-04-15 DIAGNOSIS — R7303 Prediabetes: Secondary | ICD-10-CM

## 2021-04-19 ENCOUNTER — Ambulatory Visit: Payer: Medicare Other | Admitting: Physician Assistant

## 2021-04-25 ENCOUNTER — Ambulatory Visit (INDEPENDENT_AMBULATORY_CARE_PROVIDER_SITE_OTHER): Payer: Medicare Other | Admitting: Surgical

## 2021-04-25 ENCOUNTER — Other Ambulatory Visit: Payer: Self-pay

## 2021-04-25 DIAGNOSIS — C4442 Squamous cell carcinoma of skin of scalp and neck: Secondary | ICD-10-CM | POA: Diagnosis not present

## 2021-04-25 NOTE — Progress Notes (Signed)
° °  Referring Provider [name], MD [place],  [place] [address]   CC:  Chief Complaint  Patient presents with   Cory [name] is an 80 y.o. male.  HPI: 80 year old male here for follow-up after placement of Integra on his scalp wound after Mohs excision.  He is doing well.  He has been doing Xeroform dressing changes daily.  He is continue to wash the area.  He is not having any issues.  His wife has been helping with dressing changes.  Review of Systems General: No fevers or chills  Physical Exam Vitals with BMI 04/04/2021 04/04/2021 04/04/2021  Height - - -  Weight - - -  BMI - - -  Systolic 703 403 524  Diastolic 87 89 83  Pulse 70 67 66    General:  No acute distress,  Alert and oriented, Non-Toxic, Normal speech and affect Skin: Scalp wound with a significant amount of epithelialization noted.  There is some scabbing noted centrally, there is no erythema or cellulitic changes.  No exposed skull.      Assessment/Plan Recommend continue with wound care over the next few weeks, recommend following up in a few weeks for reevaluation.  He can continue to shower and get the area wet.  All of his questions were answered to his content.  Recommend calling with any additional questions or concerns.  Pictures were obtained of the patient and placed in the chart with the patient's or guardian's permission.   Cory [name] 04/25/2021, 4:07 PM

## 2021-05-01 ENCOUNTER — Encounter: Payer: Self-pay | Admitting: Family

## 2021-05-01 ENCOUNTER — Other Ambulatory Visit: Payer: Self-pay

## 2021-05-01 ENCOUNTER — Ambulatory Visit (INDEPENDENT_AMBULATORY_CARE_PROVIDER_SITE_OTHER): Payer: Medicare Other | Admitting: Family

## 2021-05-01 VITALS — BP 104/64 | HR 56 | Temp 97.3°F | Ht 65.0 in | Wt 159.0 lb

## 2021-05-01 DIAGNOSIS — J0110 Acute frontal sinusitis, unspecified: Secondary | ICD-10-CM

## 2021-05-01 DIAGNOSIS — E871 Hypo-osmolality and hyponatremia: Secondary | ICD-10-CM

## 2021-05-01 DIAGNOSIS — D509 Iron deficiency anemia, unspecified: Secondary | ICD-10-CM | POA: Diagnosis not present

## 2021-05-01 DIAGNOSIS — I10 Essential (primary) hypertension: Secondary | ICD-10-CM

## 2021-05-01 DIAGNOSIS — E1169 Type 2 diabetes mellitus with other specified complication: Secondary | ICD-10-CM

## 2021-05-01 DIAGNOSIS — R413 Other amnesia: Secondary | ICD-10-CM | POA: Diagnosis not present

## 2021-05-01 DIAGNOSIS — E1165 Type 2 diabetes mellitus with hyperglycemia: Secondary | ICD-10-CM | POA: Diagnosis not present

## 2021-05-01 LAB — COMPREHENSIVE METABOLIC PANEL
ALT: 21 U/L (ref 0–53)
AST: 27 U/L (ref 0–37)
Albumin: 3.9 g/dL (ref 3.5–5.2)
Alkaline Phosphatase: 48 U/L (ref 39–117)
BUN: 11 mg/dL (ref 6–23)
CO2: 23 mEq/L (ref 19–32)
Calcium: 9 mg/dL (ref 8.4–10.5)
Chloride: 98 mEq/L (ref 96–112)
Creatinine, Ser: 0.91 mg/dL (ref 0.40–1.50)
GFR: 78.75 mL/min (ref >60.00)
Glucose, Bld: 83 mg/dL (ref 70–99)
Potassium: 4.9 mEq/L (ref 3.5–5.1)
Sodium: 131 mEq/L — ABNORMAL LOW (ref 135–145)
Total Bilirubin: 0.5 mg/dL (ref 0.2–1.2)
Total Protein: 6.3 g/dL (ref 6.0–8.3)

## 2021-05-01 LAB — B12 AND FOLATE PANEL
Folate: >24.2 ng/mL (ref >5.9)
Vitamin B-12: 940 pg/mL — ABNORMAL HIGH (ref 211–911)

## 2021-05-01 LAB — HEMOGLOBIN A1C: Hgb A1c MFr Bld: 6.5 % (ref 4.6–6.5)

## 2021-05-01 LAB — TSH: TSH: 1.8 u[IU]/mL (ref 0.35–5.50)

## 2021-05-01 MED ORDER — DOXYCYCLINE HYCLATE 100 MG PO CAPS: 100.0000 mg | ORAL_CAPSULE | Freq: Two times a day (BID) | ORAL | 0 refills | Status: AC

## 2021-05-01 MED ORDER — IRON (FERROUS SULFATE) 325 (65 FE) MG PO TABS: 325.0000 mg | ORAL_TABLET | Freq: Every day | ORAL | 1 refills | Status: DC

## 2021-05-01 NOTE — Progress Notes (Signed)
Established Patient Office Visit  Subjective:  Patient ID: Cory [name], male    DOB: 02/19/1941  Age: 81 y.o. MRN: [id number]  CC:  Chief Complaint  Patient presents with   bleeding from nostril    When blowing nose. Now he hasn't had any problems.     HPI Cory [name] [name] is here today with c/o PND and dry non productive cough. He has occasional sore throat, nasal congestion, and just 'cant seem to kick this cold'. He initially started with symptoms about two weeks ago. Denies fever and or chills.  He does have concerns over his memory. He states that  He did have a stroke in 2015, he does find that at time since then it is hard for him to find the right words to say. He does report lately having increased problems with recalling things. He does state he keeps a calender on him that helps, but he does feel as though he has a lot of weight on his shoulders to keep his memory keen as his wife is starting with memory loss and so he is in charge of helping her at home with her medications and memory as well. He is established with Dr. Brigitte Pulse, neurology.  04/05/21 went to ED for near syncope and weakness. While trying to have a bowel movement he felt like he was going to pass out on the toilet, did not fully lose consciousness and fell off toilet onto left side followed by episode of vomiting and diarrhea.  -Reviewed CT head w/o contrast with no acute findings, atrophy and chronic small vessel ischemic changes of white matter.  -Reviewed right elbow xray with no acute fracture seen, small 1 cm spur.  Cbc , anemia improved. Low sodium however stable. Pt has not seen endo.  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   Left scalp - EDC   Stroke ([place])    on Plavix and ASA   Vasovagal syncope     Past Surgical History:  Procedure Laterality Date   BACK SURGERY     CHOLECYSTECTOMY     CORONARY ARTERY BYPASS GRAFT  2000   DEBRIDEMENT AND CLOSURE WOUND N/A 01/19/2021   Procedure: Closure of scalp defect;  Surgeon: Cindra Presume, MD;  Location: [facility];  Service: Plastics;  Laterality: N/A;   NECK SURGERY      Family History  Problem Relation Age of Onset   Heart Problems Mother    Heart disease Mother    Heart Problems Father    Heart attack Father    Heart disease Brother     Social History   Socioeconomic History   Marital status: Married    Spouse name: Not on file   Number of children: Not on file   Years of education: Not on file   Highest education level: Not on file  Occupational History   Occupation: retired  Tobacco Use   Smoking status: Former    Types: Pipe   Smokeless tobacco: Never  Scientific laboratory technician Use: Never used  Substance  and Sexual Activity   Alcohol use: Never   Drug use: Never   Sexual activity: Yes  Other Topics Concern   Not on file  Social History Narrative   Not on file   Social Determinants of Health   Financial Resource Strain: Not on file  Food Insecurity: Not on file  Transportation Needs: Not on file  Physical Activity: Not on file  Stress: Not on file  Social Connections: Not on file  Intimate Partner Violence: Not on file    Outpatient Medications Prior to Visit  Medication Sig Dispense Refill   acetaminophen (TYLENOL) 500 MG tablet Take 1,000 mg by mouth at bedtime.     amLODipine (NORVASC) 5 MG tablet TAKE 1 TABLET EVERY DAY 90 tablet 1   aspirin EC 81 MG tablet Take 1 tablet (81 mg total) by mouth daily. Swallow whole. 90 tablet 3   atorvastatin (LIPITOR) 40 MG tablet TAKE 1 TABLET EVERY DAY 90 tablet 0   cloNIDine (CATAPRES) 0.1 MG tablet Take 1  tablet (0.1 mg total) by mouth 2 (two) times daily. 180 tablet 3   clopidogrel (PLAVIX) 75 MG tablet TAKE 1 TABLET EVERY DAY 90 tablet 3   cycloSPORINE (RESTASIS) 0.05 % ophthalmic emulsion 1 drop 2 (two) times a day.     diclofenac Sodium (VOLTAREN) 1 % GEL Apply 2 g topically as needed.     escitalopram (LEXAPRO) 5 MG tablet Take 10 mg by mouth daily.     famotidine (PEPCID) 20 MG tablet Take 20 mg by mouth at bedtime.     fluticasone (FLONASE) 50 MCG/ACT nasal spray Place 1 spray into both nostrils daily. 16 g 3   GEMTESA 75 MG TABS TAKE 1 TABLET EVERY DAY AT 6:00 AM 90 tablet 1   latanoprost (XALATAN) 0.005 % ophthalmic solution daily.     lisinopril (ZESTRIL) 10 MG tablet TAKE 1 TABLET (10 MG TOTAL) BY MOUTH DAILY. 90 tablet 3   melatonin 5 MG TABS Take 5 mg by mouth at bedtime.     metFORMIN (GLUCOPHAGE) 500 MG tablet TAKE 1 TABLET TWICE DAILY WITH MEALS 180 tablet 1   montelukast (SINGULAIR) 10 MG tablet TAKE 1 TABLET (10 MG TOTAL) BY MOUTH AT BEDTIME. 90 tablet 3   nitroGLYCERIN (NITROSTAT) 0.4 MG SL tablet Place 1 tablet (0.4 mg total) under the tongue every 5 (five) minutes as needed. 5 tablet 0   pantoprazole (PROTONIX) 40 MG tablet Take 1 tablet (40 mg total) by mouth daily. 90 tablet 3   tamsulosin (FLOMAX) 0.4 MG CAPS capsule Take 1 capsule (0.4 mg total) by mouth daily. 90 capsule 3   Iron, Ferrous Sulfate, 325 (65 Fe) MG TABS Take 325 mg by mouth daily. 90 tablet 1   No facility-administered medications prior to visit.    Allergies  Allergen Reactions   Fesoterodine Other (See Comments)    Dry mouth and constipation   Isosorbide Other (See Comments)   Tramadol Other (See Comments)   Grass Pollen(K-O-R-T-Swt Vern) Other (See Comments)   Hydrocodone Anxiety   Meperidine Other (See Comments) and Rash    Other reaction(s): Other (See Comments) HALLUCINATIONS    Penicillins Rash    ROS Review of Systems  Constitutional:  Negative for chills and fever.  HENT:   Positive for congestion and sore throat. Negative for ear pain and sinus pain.   Respiratory:  Positive for cough (dry non productive). Negative for shortness of breath and wheezing.   Cardiovascular:  Negative for chest pain  and palpitations.     Objective:    Physical Exam Constitutional:      General: He is not in acute distress.    Appearance: Normal appearance. He is normal weight. He is not ill-appearing, toxic-appearing or diaphoretic.  HENT:     Head: Normocephalic.     Right Ear: Tympanic membrane normal.     Left Ear: Tympanic membrane normal.     Nose: Nose normal.     Mouth/Throat:     Mouth: Mucous membranes are moist.  Cardiovascular:     Rate and Rhythm: Normal rate and regular rhythm.     Pulses: Normal pulses.     Heart sounds: Normal heart sounds.  Pulmonary:     Effort: Pulmonary effort is normal.     Breath sounds: Normal breath sounds.  Neurological:     General: No focal deficit present.     Mental Status: He is alert and oriented to person, place, and time.     Cranial Nerves: No cranial nerve deficit.  Psychiatric:        Mood and Affect: Mood normal.        Behavior: Behavior normal.        Thought Content: Thought content normal.        Judgment: Judgment normal.    BP 104/64    Pulse (!) 56    Temp (!) 97.3 F (36.3 C) (Temporal)    Ht 5\' 5"  (1.651 m)    Wt 159 lb (72.1 kg)    SpO2 97%    BMI 26.46 kg/m  Wt Readings from Last 3 Encounters:  05/01/21 159 lb (72.1 kg)  04/04/21 165 lb 5.5 oz (75 kg)  03/20/21 165 lb 6 oz (75 kg)     Health Maintenance Due  Topic Date Due   OPHTHALMOLOGY EXAM  Never done   COVID-19 Vaccine (3 - Pfizer risk series) 07/13/2019    There are no preventive care reminders to display for this patient.  Lab Results  Component Value Date   TSH 1.80 05/01/2021   Lab Results  Component Value Date   WBC 9.7 04/04/2021   HGB 12.6 (L) 04/04/2021   HCT 38.4 (L) 04/04/2021   MCV 89.3 04/04/2021   PLT 199  04/04/2021   Lab Results  Component Value Date   NA 131 (L) 05/01/2021   K 4.9 05/01/2021   CO2 23 05/01/2021   GLUCOSE 83 05/01/2021   BUN 11 05/01/2021   CREATININE 0.91 05/01/2021   BILITOT 0.5 05/01/2021   ALKPHOS 48 05/01/2021   AST 27 05/01/2021   ALT 21 05/01/2021   PROT 6.3 05/01/2021   ALBUMIN 3.9 05/01/2021   CALCIUM 9.0 05/01/2021   ANIONGAP 11 04/04/2021   GFR 78.75 05/01/2021   Lab Results  Component Value Date   HGBA1C 6.5 05/01/2021      Assessment & Plan:   Problem List Items Addressed This Visit       Cardiovascular and Mediastinum   Hypertension    Continue medications as prescribed monitor blood pressure periodically and low-sodium diet.        Respiratory   Acute non-recurrent frontal sinusitis - Primary    Prescription given for augmentin 875/125 mg po bid for ten days. . Continue with humidifier prn and steam showers recommended as well. instructed If no symptom improvement in 48 hours please f/u       Relevant Medications   doxycycline (VIBRAMYCIN) 100 MG capsule     Endocrine  Type 2 diabetes mellitus with hyperglycemia, without long-term current use of insulin (HCC)    Hemoglobin A1c ordered pending results.  Continue with diabetic diet monitor feet daily for signs and symptoms of infection exercise as tolerated.  Diabetes is currently diet controlled      Relevant Orders   Hemoglobin A1c (Completed)   Comprehensive metabolic panel (Completed)   RESOLVED: Type 2 diabetes mellitus with other specified complication (HCC)     Other   Iron deficiency anemia    Iron panel and CBC ordered today pending results.  Advised patient that since iron is causing constipation to take with daily stool softener.  If constipation is still an issue he can take iron supplement every other day.  Refill provided to pharmacy      Relevant Medications   Iron, Ferrous Sulfate, 325 (65 Fe) MG TABS   Hyponatremia    Still considering Endo referral per  his primary care request last visit.  Lets check sodium again labs ordered pending results for further guidance.,       Relevant Orders   TSH (Completed)   Comprehensive metabolic panel (Completed)   Memory impairment    Suggested patient follow-up with his neurologist that he is already established with, patient states he will call and make an appointment in regards to worsening memory      Relevant Orders   TSH (Completed)   B12 and Folate Panel (Completed)    Meds ordered this encounter  Medications   Iron, Ferrous Sulfate, 325 (65 Fe) MG TABS    Sig: Take 325 mg by mouth daily.    Dispense:  90 tablet    Refill:  1    Order Specific Question:   Supervising Provider    Answer:   BEDSOLE, AMY E [[id number]]   doxycycline (VIBRAMYCIN) 100 MG capsule    Sig: Take 1 capsule (100 mg total) by mouth 2 (two) times daily for 10 days.    Dispense:  20 capsule    Refill:  0    Order Specific Question:   Supervising Provider    Answer:   Diona Browner, AMY E [[id number]]    Follow-up: Return in about 3 months (around 07/30/2021) for regular folllow up appointmet.    Cory Pancoast, FNP

## 2021-05-01 NOTE — Patient Instructions (Addendum)
Stop by the lab prior to leaving today. I will notify you of your results once received.   Repeat labs today which will direct the course of what we need to do going forward, and to rule out other causes of worsening memory loss. Also, will repeat sodium, will try to rule out some other causes, but if all normal range will consider referral to endocrinologist as Dr. [name] has suggested this for possible adrenal causes?   An antibiotic was prescribed to your preferred pharmacy today, please pick up and take as directed.If you still notice memory disturances after feeling better from your sinusitis, get in touch with your neurologist for further evaluation. As we dicussed sometimes infection can worsen memory in the elderly. I will also check your B12 in your lab work today.   Pick up a stool softener (colace) and continue daily iron tablets. Iron likely causing constipation.

## 2021-05-02 DIAGNOSIS — E1165 Type 2 diabetes mellitus with hyperglycemia: Secondary | ICD-10-CM | POA: Insufficient documentation

## 2021-05-02 DIAGNOSIS — E871 Hypo-osmolality and hyponatremia: Secondary | ICD-10-CM | POA: Insufficient documentation

## 2021-05-02 DIAGNOSIS — D509 Iron deficiency anemia, unspecified: Secondary | ICD-10-CM | POA: Insufficient documentation

## 2021-05-02 DIAGNOSIS — R413 Other amnesia: Secondary | ICD-10-CM | POA: Insufficient documentation

## 2021-05-02 DIAGNOSIS — J0110 Acute frontal sinusitis, unspecified: Secondary | ICD-10-CM | POA: Insufficient documentation

## 2021-05-02 DIAGNOSIS — G3184 Mild cognitive impairment, so stated: Secondary | ICD-10-CM | POA: Insufficient documentation

## 2021-05-02 NOTE — Assessment & Plan Note (Signed)
Continue medications as prescribed monitor blood pressure periodically and low-sodium diet.

## 2021-05-02 NOTE — Assessment & Plan Note (Signed)
Suggested patient follow-up with his neurologist that he is already established with, patient states he will call and make an appointment in regards to worsening memory

## 2021-05-02 NOTE — Assessment & Plan Note (Deleted)
Hemoglobin A1c ordered pending results.  Continue with diabetic diet monitor feet daily for signs and symptoms of infection exercise as tolerated.  Diabetes is currently diet controlled

## 2021-05-02 NOTE — Assessment & Plan Note (Signed)
Prescription given for augmentin 875/125 mg po bid for ten days. . Continue with humidifier prn and steam showers recommended as well. instructed If no symptom improvement in 48 hours please f/u

## 2021-05-02 NOTE — Assessment & Plan Note (Signed)
Iron panel and CBC ordered today pending results.  Advised patient that since iron is causing constipation to take with daily stool softener.  If constipation is still an issue he can take iron supplement every other day.  Refill provided to pharmacy

## 2021-05-02 NOTE — Assessment & Plan Note (Signed)
Still considering Endo referral per his primary care request last visit.  Lets check sodium again labs ordered pending results for further guidance.,

## 2021-05-02 NOTE — Assessment & Plan Note (Signed)
Hemoglobin A1c ordered pending results.  Continue with diabetic diet monitor feet daily for signs and symptoms of infection exercise as tolerated.  Diabetes is currently diet controlled

## 2021-05-07 ENCOUNTER — Ambulatory Visit: Payer: Medicare Other | Admitting: Physician Assistant

## 2021-05-15 NOTE — Progress Notes (Signed)
05/16/2021 3:02 PM   Cory [name] 01-Dec-1940 [id number]  Referring provider: [name], MD [place],  [place] [id number]  Chief Complaint  Patient presents with   Benign Prostatic Hypertrophy   Urinary Frequency   Urological history 1. BPH with LU TS -aged out of PSA screening -cysto 06/2020 - mild prostamegaly with bladder trabeculation -I PSS 11/3 -PVR 147 mL -managed with tamsulosin 0.4 mg daily  2. Urinary frequency -contributing factors of age, BPH, stroke, HTN, diabetes, anxiety and depression -PVR 147 mL -managed with Gemtesa 75 mg daily    HPI: Cory [name] is a 81 y.o. male who presents today for 6 month follow up.  PVR 147 mL  He would like further discussions regarding the mechanism of action for the tamsulosin and Gemtesa  He has noticed a decrease in the force of his urinary stream a night.  Patient denies any modifying or aggravating factors.  Patient denies any gross hematuria, dysuria or suprapubic/flank pain.  Patient denies any fevers, chills, nausea or vomiting.     IPSS     Row Name 05/16/21 1400         International Prostate Symptom Score   How often have you had the sensation of not emptying your bladder? Not at All     How often have you had to urinate less than every two hours? Less than 1 in 5 times     How often have you found you stopped and started again several times when you urinated? Less than half the time     How often have you found it difficult to postpone urination? Less than half the time     How often have you had a weak urinary stream? Less than half the time     How often have you had to strain to start urination? Less than 1 in 5 times     How many times did you typically get up at night to urinate? 3 Times     Total IPSS Score 11       Quality of Life due to urinary symptoms   If you were to spend the rest of your life with your urinary condition just the way it is now how would you  feel about that? 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   Left scalp - EDC   Stroke ([facility])    on Plavix and ASA   Vasovagal syncope     Surgical History: Past Surgical History:  Procedure Laterality Date   BACK SURGERY     CHOLECYSTECTOMY     CORONARY ARTERY BYPASS GRAFT  2000   DEBRIDEMENT AND CLOSURE WOUND N/A 01/19/2021   Procedure: Closure of scalp defect;  Surgeon: Cindra Presume, MD;  Location: [facility];  Service: Plastics;  Laterality: N/A;   NECK SURGERY      Home Medications:  Allergies  as of 05/16/2021       Reactions   Fesoterodine Other (See Comments)   Dry mouth and constipation   Isosorbide Other (See Comments)   Tramadol Other (See Comments)   Grass Pollen(k-o-r-t-swt Vern) Other (See Comments)   Hydrocodone Anxiety   Meperidine Other (See Comments), Rash   Other reaction(s): Other (See Comments) HALLUCINATIONS   Penicillins Rash        Medication List        Accurate as of May 16, 2021  3:02 PM. If you have any questions, ask your nurse or doctor.          acetaminophen 500 MG tablet Commonly known as: TYLENOL Take 1,000 mg by mouth at bedtime.   amLODipine 5 MG tablet Commonly known as: NORVASC TAKE 1 TABLET EVERY DAY   aspirin EC 81 MG tablet Take 1 tablet (81 mg total) by mouth daily. Swallow whole.   atorvastatin 40 MG tablet Commonly known as: LIPITOR TAKE 1 TABLET EVERY DAY   cloNIDine 0.1 MG tablet Commonly known as: CATAPRES Take 1 tablet (0.1 mg total) by mouth 2 (two) times daily.    clopidogrel 75 MG tablet Commonly known as: PLAVIX TAKE 1 TABLET EVERY DAY   cycloSPORINE 0.05 % ophthalmic emulsion Commonly known as: RESTASIS 1 drop 2 (two) times a day.   diclofenac Sodium 1 % Gel Commonly known as: VOLTAREN Apply 2 g topically as needed.   escitalopram 5 MG tablet Commonly known as: LEXAPRO Take 10 mg by mouth daily.   famotidine 20 MG tablet Commonly known as: PEPCID Take 20 mg by mouth at bedtime.   fluticasone 50 MCG/ACT nasal spray Commonly known as: FLONASE Place 1 spray into both nostrils daily.   Gemtesa 75 MG Tabs Generic drug: Vibegron Take 75 mg by mouth daily. What changed: See the new instructions. Changed by: Zara Council, PA-C   Iron (Ferrous Sulfate) 325 (65 Fe) MG Tabs Take 325 mg by mouth daily.   latanoprost 0.005 % ophthalmic solution Commonly known as: XALATAN daily.   lisinopril 10 MG tablet Commonly known as: ZESTRIL TAKE 1 TABLET (10 MG TOTAL) BY MOUTH DAILY.   melatonin 5 MG Tabs Take 5 mg by mouth at bedtime.   metFORMIN 500 MG tablet Commonly known as: GLUCOPHAGE TAKE 1 TABLET TWICE DAILY WITH MEALS   montelukast 10 MG tablet Commonly known as: SINGULAIR TAKE 1 TABLET (10 MG TOTAL) BY MOUTH AT BEDTIME.   nitroGLYCERIN 0.4 MG SL tablet Commonly known as: NITROSTAT Place 1 tablet (0.4 mg total) under the tongue every 5 (five) minutes as needed.   pantoprazole 40 MG tablet Commonly known as: PROTONIX Take 1 tablet (40 mg total) by mouth daily.   tamsulosin 0.4 MG Caps capsule Commonly known as: FLOMAX Take 1 capsule (0.4 mg total) by mouth daily.        Allergies:  Allergies  Allergen Reactions   Fesoterodine Other (See Comments)    Dry mouth and constipation   Isosorbide Other (See Comments)   Tramadol Other (See Comments)   Grass Pollen(K-O-R-T-Swt Vern) Other (See Comments)   Hydrocodone Anxiety   Meperidine Other (See Comments) and Rash    Other reaction(s): Other (See  Comments) HALLUCINATIONS    Penicillins Rash    Family History: Family History  Problem Relation Age of Onset   Heart Problems Mother    Heart disease Mother    Heart Problems Father    Heart attack Father    Heart disease Brother  Social History:  reports that he has quit smoking. His smoking use included pipe. He has never used smokeless tobacco. He reports that he does not drink alcohol and does not use drugs.  ROS: Pertinent ROS in HPI  Physical Exam: BP (!) 148/67    Pulse 68    Ht 5\' 5"  (1.651 m)    Wt 160 lb (72.6 kg)    BMI 26.63 kg/m   Constitutional:  Well nourished. Alert and oriented, No acute distress. HEENT: [place] AT, mask in place.  Trachea midline Cardiovascular: No clubbing, cyanosis, or edema. Respiratory: Normal respiratory effort, no increased work of breathing. Neurologic: Grossly intact, no focal deficits, moving all 4 extremities. Psychiatric: Normal mood and affect.  Laboratory Data: Lab Results  Component Value Date   WBC 9.7 04/04/2021   HGB 12.6 (L) 04/04/2021   HCT 38.4 (L) 04/04/2021   MCV 89.3 04/04/2021   PLT 199 04/04/2021    Lab Results  Component Value Date   CREATININE 0.91 05/01/2021    Lab Results  Component Value Date   HGBA1C 6.5 05/01/2021    Lab Results  Component Value Date   TSH 1.80 05/01/2021       Component Value Date/Time   CHOL 120 01/29/2021 1530   CHOL 137 03/13/2020 1123   HDL 44.20 01/29/2021 1530   HDL 47 03/13/2020 1123   CHOLHDL 3 01/29/2021 1530   VLDL 41.2 (H) 01/29/2021 1530   LDLCALC 56 05/07/2020 0512   LDLCALC 65 03/13/2020 1123    Lab Results  Component Value Date   AST 27 05/01/2021   Lab Results  Component Value Date   ALT 21 05/01/2021    Urinalysis    Component Value Date/Time   COLORURINE YELLOW 04/04/2021 1342   APPEARANCEUR CLEAR (A) 04/04/2021 1342   APPEARANCEUR Hazy (A) 07/18/2020 1424   LABSPEC 1.020 04/04/2021 1342   PHURINE 7.0 04/04/2021 1342   GLUCOSEU  NEGATIVE 04/04/2021 1342   HGBUR TRACE (A) 04/04/2021 1342   BILIRUBINUR NEGATIVE 04/04/2021 1342   BILIRUBINUR Negative 07/18/2020 1424   KETONESUR NEGATIVE 04/04/2021 1342   PROTEINUR NEGATIVE 04/04/2021 1342   NITRITE NEGATIVE 04/04/2021 1342   LEUKOCYTESUR NEGATIVE 04/04/2021 1342  I have reviewed the labs.   Pertinent Imaging:  05/16/21 14:41  Scan Result 173mL    Assessment & Plan:    1. BPH with LUTS -PVR < 300 cc -bothersome symptoms of a weaken stream, mostly at night -continue conservative management, avoiding bladder irritants and timed voiding's -Continue tamsulosin 0.4 mg daily - refills given -explained how the tamsulosin works -not interested in bladder outlet procedure at this time   2. Frequency -continue Gemtesa 75 mg daily -explained how the [place] works    Return for IPSS and PVR.  These notes generated with voice recognition software. I apologize for typographical errors.  Zara Council, PA-C  [facility] [address]  [place], [place] [address] [phone]

## 2021-05-16 ENCOUNTER — Other Ambulatory Visit: Payer: Self-pay

## 2021-05-16 ENCOUNTER — Ambulatory Visit (INDEPENDENT_AMBULATORY_CARE_PROVIDER_SITE_OTHER): Payer: Medicare Other | Admitting: Urology

## 2021-05-16 ENCOUNTER — Encounter: Payer: Self-pay | Admitting: Urology

## 2021-05-16 VITALS — BP 148/67 | HR 68 | Ht 65.0 in | Wt 160.0 lb

## 2021-05-16 DIAGNOSIS — N138 Other obstructive and reflux uropathy: Secondary | ICD-10-CM | POA: Diagnosis not present

## 2021-05-16 DIAGNOSIS — R350 Frequency of micturition: Secondary | ICD-10-CM

## 2021-05-16 DIAGNOSIS — N401 Enlarged prostate with lower urinary tract symptoms: Secondary | ICD-10-CM | POA: Diagnosis not present

## 2021-05-16 LAB — BLADDER SCAN AMB NON-IMAGING

## 2021-05-16 MED ORDER — TAMSULOSIN HCL 0.4 MG PO CAPS: 0.4000 mg | ORAL_CAPSULE | Freq: Every day | ORAL | 3 refills | Status: DC

## 2021-05-16 MED ORDER — GEMTESA 75 MG PO TABS: 75.0000 mg | ORAL_TABLET | Freq: Every day | ORAL | 3 refills | Status: DC

## 2021-05-18 ENCOUNTER — Encounter: Payer: Self-pay | Admitting: Urology

## 2021-05-22 ENCOUNTER — Ambulatory Visit (INDEPENDENT_AMBULATORY_CARE_PROVIDER_SITE_OTHER): Payer: Medicare Other | Admitting: Surgical

## 2021-05-22 ENCOUNTER — Other Ambulatory Visit: Payer: Self-pay

## 2021-05-22 DIAGNOSIS — C4442 Squamous cell carcinoma of skin of scalp and neck: Secondary | ICD-10-CM

## 2021-05-22 NOTE — Progress Notes (Signed)
° °  Referring Provider [name], MD [place],  [place] [address]   CC:  Chief Complaint  Patient presents with   Cory [name] is an 81 y.o. male.  HPI: 81 year old male here for follow-up after placement of Integra and a scalp wound after Mohs excision.  Patient reports he is overall doing well in regards to his scalp.  He is not having any issues.  Review of Systems General: No fevers or chills  Physical Exam Vitals with BMI 05/16/2021 05/01/2021 04/04/2021  Height 5\' 5"  5\' 5"  -  Weight 160 lbs 159 lbs -  BMI 15.83 09.40 -  Systolic 768 088 110  Diastolic 67 64 87  Pulse 68 56 70    General:  No acute distress,  Alert and oriented, Non-Toxic, Normal speech and affect Scalp: Scalp wound has completely epithelialized, no open granulation tissue noted.  No surrounding erythema or cellulitic changes.  No drainage is noted.     Assessment/Plan No further wound care necessary in regards to the scalp wound, he does have a lesion on his forehead that he is going to see the dermatologist in regards to in a few weeks.    Recommend following up as needed.  Picture was taken and placed in the patient's chart with patient's permission.  Carola Rhine Kelli Egolf 05/22/2021, 1:56 PM

## 2021-06-04 ENCOUNTER — Other Ambulatory Visit: Payer: Self-pay

## 2021-06-04 ENCOUNTER — Ambulatory Visit (INDEPENDENT_AMBULATORY_CARE_PROVIDER_SITE_OTHER): Payer: Medicare Other | Admitting: Dermatology

## 2021-06-04 ENCOUNTER — Encounter: Payer: Self-pay | Admitting: Dermatology

## 2021-06-04 DIAGNOSIS — L820 Inflamed seborrheic keratosis: Secondary | ICD-10-CM

## 2021-06-04 DIAGNOSIS — L570 Actinic keratosis: Secondary | ICD-10-CM | POA: Diagnosis not present

## 2021-06-04 DIAGNOSIS — L578 Other skin changes due to chronic exposure to nonionizing radiation: Secondary | ICD-10-CM | POA: Diagnosis not present

## 2021-06-04 DIAGNOSIS — L821 Other seborrheic keratosis: Secondary | ICD-10-CM | POA: Diagnosis not present

## 2021-06-04 DIAGNOSIS — Z85828 Personal history of other malignant neoplasm of skin: Secondary | ICD-10-CM

## 2021-06-04 NOTE — Patient Instructions (Addendum)
Cryotherapy Aftercare  Wash gently with soap and water everyday.   Apply Vaseline and Band-Aid daily until healed.   Prior to procedure, discussed risks of blister formation, small wound, skin dyspigmentation, or rare scar following cryotherapy. Recommend Vaseline ointment to treated areas while healing.   Recommend daily broad spectrum sunscreen SPF 30+ to sun-exposed areas, reapply every 2 hours as needed. Call for new or changing lesions.  Staying in the shade or wearing long sleeves, sun glasses (UVA+UVB protection) and wide brim hats (4-inch brim around the entire circumference of the hat) are also recommended for sun protection.   If You Need Anything After Your Visit  If you have any questions or concerns for your doctor, please call our main line at [phone] and press option 4 to reach your doctor's medical assistant. If no one answers, please leave a voicemail as directed and we will return your call as soon as possible. Messages left after 4 pm will be answered the following business day.   You may also send [place] a message via [place]. We typically respond to MyChart messages within 1-2 business days.  For prescription refills, please ask your pharmacy to contact our office. Our fax number is [phone].  If you have an urgent issue when the clinic is closed that cannot wait until the next business day, you can page your doctor at the number below.    Please note that while we do our best to be available for urgent issues outside of office hours, we are not available 24/7.   If you have an urgent issue and are unable to reach [place], you may choose to seek medical care at your doctor's office, retail clinic, urgent care center, or emergency room.  If you have a medical emergency, please immediately call 911 or go to the emergency department.  Pager Numbers  - Dr. Nehemiah Massed: [phone]  - Dr. Laurence Ferrari: [phone]  - Dr. Nicole Kindred: [phone]  In the event of inclement  weather, please call our main line at [phone] for an update on the status of any delays or closures.  Dermatology Medication Tips: Please keep the boxes that topical medications come in in order to help keep track of the instructions about where and how to use these. Pharmacies typically print the medication instructions only on the boxes and not directly on the medication tubes.   If your medication is too expensive, please contact our office at [phone] option 4 or send [place] a message through [place].   We are unable to tell what your co-pay for medications will be in advance as this is different depending on your insurance coverage. However, we may be able to find a substitute medication at lower cost or fill out paperwork to get insurance to cover a needed medication.   If a prior authorization is required to get your medication covered by your insurance company, please allow [place] 1-2 business days to complete this process.  Drug prices often vary depending on where the prescription is filled and some pharmacies may offer cheaper prices.  The website www.goodrx.com contains coupons for medications through different pharmacies. The prices here do not account for what the cost may be with help from insurance (it may be cheaper with your insurance), but the website can give you the price if you did not use any insurance.  - You can print the associated coupon and take it with your prescription to the pharmacy.  - You may also stop by our office during regular  business hours and pick up a GoodRx coupon card.  - If you need your prescription sent electronically to a different pharmacy, notify our office through [facility] or by phone at [phone] option 4.     Si Usted Necesita Algo Despus de Su Visita  Tambin puede enviarnos un mensaje a travs de Pharmacist, community. Por lo general respondemos a los mensajes de MyChart en el transcurso de 1 a 2 das hbiles.  Para renovar recetas,  por favor pida a su farmacia que se ponga en contacto con nuestra oficina. Harland Dingwall de fax es [place] [phone].  Si tiene un asunto urgente cuando la clnica est cerrada y que no puede esperar hasta el siguiente da hbil, puede llamar/localizar a su doctor(a) al nmero que aparece a continuacin.   Por favor, tenga en cuenta que aunque hacemos todo lo posible para estar disponibles para asuntos urgentes fuera del horario de [place], no estamos disponibles las 24 horas del da, los 7 das de la [place].   Si tiene un problema urgente y no puede comunicarse con nosotros, puede optar por buscar atencin mdica  en el consultorio de su doctor(a), en una clnica privada, en un centro de atencin urgente o en una sala de emergencias.  Si tiene Engineering geologist, por favor llame inmediatamente al 911 o vaya a la sala de emergencias.  Nmeros de bper  - Dr. Nehemiah Massed: [phone]  - Dra. Moye: [phone]  - Dra. Nicole Kindred: [phone]  En caso de inclemencias del [place], por favor llame a Johnsie Kindred principal al [phone] para una actualizacin sobre el [place] de cualquier retraso o cierre.  Consejos para la medicacin en dermatologa: Por favor, guarde las cajas en las que vienen los medicamentos de uso tpico para ayudarle a seguir las instrucciones sobre dnde y cmo usarlos. Las farmacias generalmente imprimen las instrucciones del medicamento slo en las cajas y no directamente en los tubos del [place].   Si su medicamento es muy caro, por favor, pngase en contacto con Zigmund Daniel llamando al [phone] y presione la opcin 4 o envenos un mensaje a travs de Pharmacist, community.   No podemos decirle cul ser su copago por los medicamentos por adelantado ya que esto es diferente dependiendo de la cobertura de su seguro. Sin embargo, es posible que podamos encontrar un medicamento sustituto a Electrical engineer un formulario para que el seguro cubra el medicamento que se  considera necesario.   Si se requiere una autorizacin previa para que su compaa de seguros [place] su medicamento, por favor permtanos de 1 a 2 das hbiles para completar este proceso.  Los precios de los medicamentos varan con frecuencia dependiendo del Environmental consultant de dnde se surte la receta y alguna farmacias pueden ofrecer precios ms baratos.  El sitio web www.goodrx.com tiene cupones para medicamentos de Airline pilot. Los precios aqu no tienen en cuenta lo que podra costar con la ayuda del seguro (puede ser ms barato con su seguro), pero el sitio web puede darle el precio si no utiliz Research scientist (physical sciences).  - Puede imprimir el cupn correspondiente y llevarlo con su receta a la farmacia.  - Tambin puede pasar por nuestra oficina durante el horario de atencin regular y Charity fundraiser una tarjeta de cupones de GoodRx.  - Si necesita que su receta se enve electrnicamente a una farmacia diferente, informe a nuestra oficina a travs de MyChart de [place] o por telfono llamando al [phone] y presione la opcin 4.

## 2021-06-04 NOTE — Progress Notes (Signed)
Follow-Up Visit   Subjective  Cory [name] is a 81 y.o. male who presents for the following: AKs (2 month recheck. left nose, left temple, left temple, face. Hypertrophic AK left nose, left ear , left temple. C/O spot on scalp, below scar, that bleeds. Lesion on right forehead is irritated). The patient has spots, moles and lesions to be evaluated, some may be new or changing and the patient has concerns that these could be cancer.  The following portions of the chart were reviewed this encounter and updated as appropriate:  Tobacco   Allergies   Meds   Problems   Med Hx   Surg Hx   Fam Hx      Review of Systems: No other skin or systemic complaints except as noted in HPI or Assessment and Plan.  Objective  Well appearing patient in no apparent distress; mood and affect are within normal limits.  A focused examination was performed including scalp, face, arms, hands, back. Relevant physical exam findings are noted in the Assessment and Plan.  Scalp, hypertrophic x15, left forehead x3, right forehead x1, left ear x1 (20) Erythematous hyperkeratotic papules   Back x8 (8) Erythematous keratotic or waxy stuck-on papule or plaque.   Assessment & Plan   Actinic Damage - chronic, secondary to cumulative UV radiation exposure/sun exposure over time - diffuse scaly erythematous macules with underlying dyspigmentation - Recommend daily broad spectrum sunscreen SPF 30+ to sun-exposed areas, reapply every 2 hours as needed.  - Recommend staying in the shade or wearing long sleeves, sun glasses (UVA+UVB protection) and wide brim hats (4-inch brim around the entire circumference of the hat). - Call for new or changing lesions.  History of Squamous Cell Carcinoma of the Skin - No evidence of recurrence today at left frontal scalp - No lymphadenopathy - Recommend regular full body skin exams - Recommend daily broad spectrum sunscreen SPF 30+ to sun-exposed areas, reapply every 2 hours as  needed.  - Call if any new or changing lesions are noted between office visits  Seborrheic Keratoses - Stuck-on, waxy, tan-brown papules and/or plaques  - Benign-appearing - Discussed benign etiology and prognosis. 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 They are chronic with expected duration over 1 year. A portion of actinic keratoses will progress to squamous cell carcinoma of the skin. It is not possible to reliably predict which spots will progress to skin cancer and so treatment is recommended to prevent development of skin cancer.  Recommend daily broad spectrum sunscreen SPF 30+ to sun-exposed areas, reapply every 2 hours as needed.  Recommend staying in the shade or wearing long sleeves, sun glasses (UVA+UVB protection) and wide brim hats (4-inch brim around the entire circumference of the hat). Call for new or changing lesions.  Destruction of lesion - Scalp, hypertrophic x15, left forehead x3, right forehead x1, left ear x1 Complexity: simple   Destruction method: cryotherapy   Informed consent: discussed and consent obtained   Timeout:  patient name, date of birth, surgical site, and procedure verified Lesion destroyed using liquid nitrogen: Yes   Region frozen until ice ball extended beyond lesion: Yes   Outcome: patient tolerated procedure well with no complications   Post-procedure details: wound care instructions given    Inflamed seborrheic keratosis (8) Back  x8  Destruction of lesion - Back x8 Complexity: simple   Destruction method: cryotherapy   Informed consent: discussed and consent obtained   Timeout:  patient name, date of birth, surgical site, and procedure verified Lesion destroyed using liquid nitrogen: Yes   Region frozen until ice  ball extended beyond lesion: Yes   Outcome: patient tolerated procedure well with no complications   Post-procedure details: wound care instructions given     Return in about 2 months (around 08/02/2021) for AK Follow Up.  I, Emelia Salisbury, CMA, am acting as scribe for Sarina Ser, MD. Documentation: I have reviewed the above documentation for accuracy and completeness, and I agree with the above.  Sarina Ser, MD

## 2021-06-05 ENCOUNTER — Encounter: Payer: Self-pay | Admitting: Dermatology

## 2021-06-06 ENCOUNTER — Ambulatory Visit
Admission: RE | Admit: 2021-06-06 | Discharge: 2021-06-06 | Disposition: A | Discharge status: Home / Self Care | Payer: Medicare Other | Source: Ambulatory Visit | Attending: Neurosurgery | Admitting: Neurosurgery

## 2021-06-06 ENCOUNTER — Other Ambulatory Visit: Payer: Self-pay

## 2021-06-06 DIAGNOSIS — I671 Cerebral aneurysm, nonruptured: Secondary | ICD-10-CM | POA: Diagnosis not present

## 2021-06-06 LAB — POCT I-STAT CREATININE: Creatinine, Ser: 0.9 mg/dL (ref 0.61–1.24)

## 2021-06-06 MED ORDER — IOHEXOL 350 MG/ML SOLN
75.0000 mL | Freq: Once | INTRAVENOUS | Status: AC | PRN
  Administered 2021-06-06: 75 mL via INTRAVENOUS

## 2021-06-13 ENCOUNTER — Other Ambulatory Visit: Payer: Self-pay

## 2021-06-13 ENCOUNTER — Ambulatory Visit: Payer: Medicare Other | Admitting: Family Medicine

## 2021-06-13 VITALS — BP 130/70 | HR 66 | Temp 97.6°F | Ht 64.75 in | Wt 162.1 lb

## 2021-06-13 DIAGNOSIS — K644 Residual hemorrhoidal skin tags: Secondary | ICD-10-CM | POA: Diagnosis not present

## 2021-06-13 DIAGNOSIS — R94120 Abnormal auditory function study: Secondary | ICD-10-CM | POA: Diagnosis not present

## 2021-06-13 DIAGNOSIS — Z0000 Encounter for general adult medical examination without abnormal findings: Secondary | ICD-10-CM

## 2021-06-13 NOTE — Progress Notes (Signed)
Subjective:   Cory [name] is a 81 y.o. male who presents for Medicare Annual/Subsequent preventive examination.  Review of Systems    Review of Systems  Constitutional:  Negative for chills and fever.  HENT:  Negative for congestion and sore throat.   Eyes:  Negative for blurred vision and double vision.  Respiratory:  Negative for shortness of breath.   Cardiovascular:  Negative for chest pain.  Gastrointestinal:  Negative for heartburn, nausea and vomiting.  Genitourinary: Negative.   Musculoskeletal: Negative.  Negative for myalgias.  Skin:  Negative for rash.  Neurological:  Negative for dizziness and headaches.       Balance trouble  Endo/Heme/Allergies:  Does not bruise/bleed easily.  Psychiatric/Behavioral:  Negative for depression. The patient is not nervous/anxious.    Cardiac Risk Factors include: advanced age (>54men, >80 women);hypertension;male gender;diabetes mellitus;sedentary lifestyle;smoking/ tobacco exposure     Objective:    Today's Vitals   06/13/21 1110 06/13/21 1121  BP: 130/70   Pulse: 66   Temp: 97.6 F (36.4 C)   TempSrc: Oral   SpO2: 98%   Weight: 162 lb 1 oz (73.5 kg)   Height: 5' 4.75" (1.645 m)   PainSc:  1    Body mass index is 27.18 kg/m.  Advanced Directives 06/13/2021 04/04/2021 01/29/2021 01/19/2021 01/16/2021 07/18/2020 05/06/2020  Does Patient Have a Medical Advance Directive? No Yes Yes Yes Yes Yes No  Type of Advance Directive - Living will [place];Living will - [place];Living will - -  Does patient want to make changes to medical advance directive? Yes (MAU/Ambulatory/Procedural Areas - Information given) - Yes (MAU/Ambulatory/Procedural Areas - Information given) No - Patient declined No - Patient declined No - Patient declined -  Copy of [place] in Chart? - - No - copy requested - - - -  Would patient like information on creating a medical advance directive? - No -  Patient declined - - - - No - Patient declined    Current Medications (verified) Outpatient Encounter Medications as of 06/13/2021  Medication Sig   acetaminophen (TYLENOL) 500 MG tablet Take 1,000 mg by mouth at bedtime.   amLODipine (NORVASC) 5 MG tablet TAKE 1 TABLET EVERY DAY   aspirin EC 81 MG tablet Take 1 tablet (81 mg total) by mouth daily. Swallow whole.   atorvastatin (LIPITOR) 40 MG tablet TAKE 1 TABLET EVERY DAY   cloNIDine (CATAPRES) 0.1 MG tablet Take 1 tablet (0.1 mg total) by mouth 2 (two) times daily.   clopidogrel (PLAVIX) 75 MG tablet TAKE 1 TABLET EVERY DAY   cycloSPORINE (RESTASIS) 0.05 % ophthalmic emulsion 1 drop 2 (two) times a day.   diclofenac Sodium (VOLTAREN) 1 % GEL Apply 2 g topically as needed.   escitalopram (LEXAPRO) 5 MG tablet Take 10 mg by mouth daily.   famotidine (PEPCID) 20 MG tablet Take 20 mg by mouth at bedtime.   fluticasone (FLONASE) 50 MCG/ACT nasal spray Place 1 spray into both nostrils daily.   latanoprost (XALATAN) 0.005 % ophthalmic solution daily.   lisinopril (ZESTRIL) 10 MG tablet TAKE 1 TABLET (10 MG TOTAL) BY MOUTH DAILY.   melatonin 5 MG TABS Take 5 mg by mouth at bedtime.   metFORMIN (GLUCOPHAGE) 500 MG tablet TAKE 1 TABLET TWICE DAILY WITH MEALS   montelukast (SINGULAIR) 10 MG tablet TAKE 1 TABLET (10 MG TOTAL) BY MOUTH AT BEDTIME.   nitroGLYCERIN (NITROSTAT) 0.4 MG SL tablet Place 1 tablet (0.4  mg total) under the tongue every 5 (five) minutes as needed.   pantoprazole (PROTONIX) 40 MG tablet Take 1 tablet (40 mg total) by mouth daily.   tamsulosin (FLOMAX) 0.4 MG CAPS capsule Take 1 capsule (0.4 mg total) by mouth daily.   Vibegron (GEMTESA) 75 MG TABS Take 75 mg by mouth daily.   Iron, Ferrous Sulfate, 325 (65 Fe) MG TABS Take 325 mg by mouth daily. (Patient not taking: Reported on 06/13/2021)   No facility-administered encounter medications on file as of 06/13/2021.    Allergies (verified) Fesoterodine, Isosorbide, Tramadol,  Grass pollen([id number]-o-r-t-swt vern), Hydrocodone, Meperidine, and Penicillins   History: 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   Left scalp - EDC   Stroke ([place])    on Plavix and ASA   Vasovagal syncope    Past Surgical History:  Procedure Laterality Date   BACK SURGERY     CHOLECYSTECTOMY     CORONARY ARTERY BYPASS GRAFT  2000   DEBRIDEMENT AND CLOSURE WOUND N/A 01/19/2021   Procedure: Closure of scalp defect;  Surgeon: Cindra Presume, MD;  Location: [facility];  Service: Plastics;  Laterality: N/A;   NECK SURGERY     Family History  Problem Relation Age of Onset   Heart Problems Mother    Heart disease Mother    Heart Problems Father    Heart attack Father    Heart disease Brother    Social History   Socioeconomic History   Marital status: Married    Spouse name: Not on file   Number of children: Not on file   Years of education: Not on file   Highest education level: Not on file  Occupational History   Occupation: retired  Tobacco Use   Smoking status: Former    Types: Pipe   Smokeless tobacco: Never  Scientific laboratory technician Use: Never used  Substance and Sexual Activity   Alcohol use: Never   Drug use: Never   Sexual activity: Yes  Other Topics Concern   Not on file  Social History Narrative   Not on file   Social Determinants of Health   Financial Resource Strain: Not on file  Food Insecurity: Not on file  Transportation Needs: Not on file  Physical Activity: Not on file  Stress: Not on file  Social Connections: Not on file    Tobacco Counseling Counseling given: Not Answered   Clinical  Intake:  Pre-visit preparation completed: No  Pain : 0-10 Pain Score: 1  Pain Type: Acute pain Pain Location: Back Pain Descriptors / Indicators: Aching Pain Onset: Yesterday Pain Frequency: Rarely     BMI - recorded: 27.18 Nutritional Status: BMI 25 -29 Overweight Nutritional Risks: None Diabetes: Yes CBG done?: No Did pt. bring in CBG monitor from home?: No  How often do you need to have someone help you when you read instructions, pamphlets, or other written materials from your doctor or pharmacy?: 1 - Never What is the last grade level you completed in school?: college  Diabetic? no  Interpreter Needed?: No  Activities of Daily Living In your present state of health, do you have any difficulty performing the following activities: 06/13/2021 01/29/2021  Hearing? Y N  Vision? N N  Difficulty concentrating or making decisions? Y N  Comment has noticed some memory issues -  Walking or climbing stairs? N Y  Comment - has a cane  Dressing or bathing? N N  Doing errands, shopping? N N  Preparing Food and eating ? N N  Using the Toilet? N N  In the past six months, have you accidently leaked urine? Y N  Comment on medication -  Do you have problems with loss of bowel control? N N  Managing your Medications? N N  Managing your Finances? N N  Housekeeping or managing your Housekeeping? N N  Some recent data might be hidden    Patient Care Team: [name], MD as PCP - General (Family Medicine) [place], Peter M, MD as PCP - Cardiology (Cardiology) Hollice Espy, MD as Consulting Physician (Urology)  Indicate any recent Medical Services you may have received from other than [facility] providers in the past year (date may be approximate).     Assessment:   This is a routine wellness examination for [place].  Hearing/Vision screen Hearing Screening   250Hz  500Hz  1000Hz  2000Hz  4000Hz   Right ear 20 25 25 25  0  Left ear 20 25 25 25  0  Vision Screening -  Comments:: Last eye exam at [facility] in 2023  Dietary issues and exercise activities discussed: Current Exercise Habits: Home exercise routine, Type of exercise: walking, Time (Minutes): 10, Frequency (Times/Week): 3, Weekly Exercise (Minutes/Week): 30, Intensity: Mild, Exercise limited by: orthopedic condition(s)   Goals Addressed             This Visit's Progress    Exercise 3x per week (30 min per time)       Work on new step at home, walk the dog park      Depression Screen PHQ 2/9 Scores 04/13/2020  PHQ - 2 Score 0    Fall Risk Fall Risk  06/13/2021 01/29/2021 04/13/2020  Falls in the past year? 0 0 1  Number falls in past yr: 0 0 0  Risk for fall due to : Impaired balance/gait - Impaired balance/gait    FALL RISK PREVENTION PERTAINING TO THE HOME:  Any stairs in or around the home? Yes  If so, are there any without handrails? Yes  Home free of loose throw rugs in walkways, pet beds, electrical cords, etc? No  Adequate lighting in your home to reduce risk of falls? Yes   ASSISTIVE DEVICES UTILIZED TO PREVENT FALLS:  Life alert? No  Use of a cane, walker or w/c? Yes  Grab bars in the bathroom? No  Shower chair or bench in shower? No  Elevated toilet seat or a handicapped toilet? No     Cognitive Function:        Mini-Cog - 06/13/21 1133     Normal clock drawing test? no    How many words correct? 3              Immunizations Immunization History  Administered Date(s) Administered   DTaP 01/24/2020   Fluad Quad(high Dose 65+) 01/28/2020, 01/29/2021   PFIZER(Purple Top)SARS-COV-2 Vaccination 05/25/2019, 06/15/2019   Pneumococcal Conjugate-13 05/10/2013   Pneumococcal Polysaccharide-23 12/03/2005   Td 12/15/2018   Tdap 04/12/2011, 04/04/2021   Zoster Recombinat (Shingrix) 10/17/2017, 12/23/2017    TDAP status: Up to date  Flu  Vaccine status: Up to date  Pneumococcal vaccine status: Up to date  Covid-19 vaccine status: Completed  vaccines  Qualifies for Shingles Vaccine? Yes   Zostavax completed No   Shingrix Completed?: Yes  Screening Tests Health Maintenance  Topic Date Due   OPHTHALMOLOGY EXAM  Never done   COVID-19 Vaccine (3 - Pfizer risk series) 07/13/2019   HEMOGLOBIN A1C  10/29/2021   FOOT EXAM  02/22/2022   TETANUS/TDAP  04/05/2031   Pneumonia Vaccine 59+ Years old  Completed   INFLUENZA VACCINE  Completed   Zoster Vaccines- Shingrix  Completed   HPV VACCINES  Aged Out    Health Maintenance  Health Maintenance Due  Topic Date Due   OPHTHALMOLOGY EXAM  Never done   COVID-19 Vaccine (3 - Pfizer risk series) 07/13/2019    Colorectal cancer screening: No longer required.   Lung Cancer Screening: (Low Dose CT Chest recommended if Age 45-80 years, 30 pack-year currently smoking OR have quit w/in 15years.) does not qualify.   Lung Cancer Screening Referral: n/a  Additional Screening:  Hepatitis C Screening: does qualify; Completed 01/2021  Vision Screening: Recommended annual ophthalmology exams for early detection of glaucoma and other disorders of the eye. Is the patient up to date with their annual eye exam?  Yes  Who is the provider or what is the name of the office in which the patient attends annual eye exams? [facility] If pt is not established with a provider, would they like to be referred to a provider to establish care?  N/a .   Dental Screening: Recommended annual dental exams for proper oral hygiene  Community Resource Referral / Chronic Care Management: CRR required this visit?  No   CCM required this visit?  No      Plan:       I have personally reviewed and noted the following in the patients chart:   Medical and social history Use of alcohol, tobacco or illicit drugs  Current medications and supplements including opioid prescriptions. Patient is not currently taking opioid prescriptions. Functional ability and status Nutritional status Physical  activity Advanced directives List of other physicians Hospitalizations, surgeries, and ER visits in previous 12 months Vitals Screenings to include cognitive, depression, and falls Referrals and appointments  In addition, I have reviewed and discussed with patient certain preventive protocols, quality metrics, and best practice recommendations. A written personalized care plan for preventive services as well as general preventive health recommendations were provided to patient.     [name], MD   06/13/2021

## 2021-06-22 ENCOUNTER — Emergency Department: Payer: Medicare Other

## 2021-06-22 ENCOUNTER — Encounter: Payer: Self-pay | Admitting: Family Medicine

## 2021-06-22 ENCOUNTER — Other Ambulatory Visit: Payer: Self-pay

## 2021-06-22 ENCOUNTER — Encounter: Payer: Self-pay | Admitting: Emergency Medicine

## 2021-06-22 ENCOUNTER — Emergency Department
Admission: EM | Admit: 2021-06-22 | Discharge: 2021-06-22 | Disposition: A | Discharge status: Home / Self Care | Payer: Medicare Other | Attending: Emergency Medicine | Admitting: Emergency Medicine

## 2021-06-22 ENCOUNTER — Encounter: Payer: Self-pay | Admitting: *Deleted

## 2021-06-22 DIAGNOSIS — E119 Type 2 diabetes mellitus without complications: Secondary | ICD-10-CM | POA: Diagnosis not present

## 2021-06-22 DIAGNOSIS — I10 Essential (primary) hypertension: Secondary | ICD-10-CM | POA: Insufficient documentation

## 2021-06-22 DIAGNOSIS — R600 Localized edema: Secondary | ICD-10-CM | POA: Diagnosis not present

## 2021-06-22 DIAGNOSIS — G8929 Other chronic pain: Secondary | ICD-10-CM | POA: Insufficient documentation

## 2021-06-22 DIAGNOSIS — I2510 Atherosclerotic heart disease of native coronary artery without angina pectoris: Secondary | ICD-10-CM | POA: Insufficient documentation

## 2021-06-22 DIAGNOSIS — Z8673 Personal history of transient ischemic attack (TIA), and cerebral infarction without residual deficits: Secondary | ICD-10-CM | POA: Diagnosis not present

## 2021-06-22 DIAGNOSIS — M79604 Pain in right leg: Secondary | ICD-10-CM | POA: Insufficient documentation

## 2021-06-22 DIAGNOSIS — Z7902 Long term (current) use of antithrombotics/antiplatelets: Secondary | ICD-10-CM | POA: Diagnosis not present

## 2021-06-22 LAB — CBC WITH DIFFERENTIAL/PLATELET
Abs Immature Granulocytes: 0.02 10*3/uL (ref 0.00–0.07)
Basophils Absolute: 0 10*3/uL (ref 0.0–0.1)
Basophils Relative: 1 %
Eosinophils Absolute: 0.2 10*3/uL (ref 0.0–0.5)
Eosinophils Relative: 3 %
HCT: 39.7 % (ref 39.0–52.0)
Hemoglobin: 12.8 g/dL — ABNORMAL LOW (ref 13.0–17.0)
Immature Granulocytes: 0 %
Lymphocytes Relative: 21 %
Lymphs Abs: 1.3 10*3/uL (ref 0.7–4.0)
MCH: 29.6 pg (ref 26.0–34.0)
MCHC: 32.2 g/dL (ref 30.0–36.0)
MCV: 91.9 fL (ref 80.0–100.0)
Monocytes Absolute: 0.8 10*3/uL (ref 0.1–1.0)
Monocytes Relative: 12 %
Neutro Abs: 4 10*3/uL (ref 1.7–7.7)
Neutrophils Relative %: 63 %
Platelets: 197 10*3/uL (ref 150–400)
RBC: 4.32 MIL/uL (ref 4.22–5.81)
RDW: 13.9 % (ref 11.5–15.5)
WBC: 6.4 10*3/uL (ref 4.0–10.5)
nRBC: 0 % (ref 0.0–0.2)

## 2021-06-22 LAB — COMPREHENSIVE METABOLIC PANEL
ALT: 22 U/L (ref 0–44)
AST: 30 U/L (ref 15–41)
Albumin: 3.7 g/dL (ref 3.5–5.0)
Alkaline Phosphatase: 44 U/L (ref 38–126)
Anion gap: 9 (ref 5–15)
BUN: 15 mg/dL (ref 8–23)
CO2: 24 mmol/L (ref 22–32)
Calcium: 8.9 mg/dL (ref 8.9–10.3)
Chloride: 99 mmol/L (ref 98–111)
Creatinine, Ser: 1.17 mg/dL (ref 0.61–1.24)
GFR, Estimated: >60 mL/min (ref >60)
Glucose, Bld: 107 mg/dL — ABNORMAL HIGH (ref 70–99)
Potassium: 4.6 mmol/L (ref 3.5–5.1)
Sodium: 132 mmol/L — ABNORMAL LOW (ref 135–145)
Total Bilirubin: 0.6 mg/dL (ref 0.3–1.2)
Total Protein: 6.5 g/dL (ref 6.5–8.1)

## 2021-06-22 LAB — BRAIN NATRIURETIC PEPTIDE: B Natriuretic Peptide: 112.1 pg/mL — ABNORMAL HIGH (ref 0.0–100.0)

## 2021-06-22 LAB — TROPONIN I (HIGH SENSITIVITY): Troponin I (High Sensitivity): 8 ng/L (ref <18)

## 2021-06-22 NOTE — ED Notes (Signed)
Sent urine to lab

## 2021-06-22 NOTE — Telephone Encounter (Signed)
I spoke with pt and pt is not sure how long noticed problem with leg.. now rt lower leg is swollen with pain in middle of rt lower leg between calf and foot. No redness. Pt is concerned about possible clot. No CP or SOB. Pain level now is 7-8.  Pt is going [facility] ED now by car. Declines 911. Sending note to Dr Damita Dunnings and Alyse Low CMA.

## 2021-06-22 NOTE — Discharge Instructions (Signed)
Use Tylenol for pain and fevers.  Up to 1000 mg per dose, up to 4 times per day.  Do not take more than 4000 mg of Tylenol/acetaminophen within 24 hours..  

## 2021-06-22 NOTE — ED Triage Notes (Signed)
Pt comes into the ED via POV c/o right lower leg pain.  Pt states the pain has been ongoing for a month intermittently, but now it is getting worse and is tender to palpation.  Pt has been wearing compression stockings to help.  Pt does have swelling noted to the legs.  Pt is currently on blood thinners.  Pt denies any SHOB and is in NAD with even and unlabored respirations.

## 2021-06-22 NOTE — ED Provider Notes (Signed)
[facility] Provider Note    Event Date/Time   First MD Initiated Contact with Patient 06/22/21 2029     (approximate)   History   Leg Pain   HPI  FOREST REDWINE III is a 81 y.o. male who presents to the ED for evaluation of Leg Pain   I reviewed PCP visit from 2/15.Routine Medicare visit. He has history of CAD s/p CABG, HTN, HLD, DM and stroke on DAPT with Plavix.  81 year old male presents to the ED for evaluation of chronic right leg pain.  He reports an atraumatic area of erythema and pain to his right anterolateral mid shin.  He says been there for a few weeks and is tender to palpation.  Reports he can walk on it without significant pain.  Denies chest pain, shortness of breath, fever, nausea, emesis or systemic symptoms.  Feels well otherwise.  He is s/p right-sided hip and knee replacements with some chronic swelling to the right leg that is at its baseline.  Physical Exam   Triage Vital Signs: ED Triage Vitals  Enc Vitals Group     BP 06/22/21 1815 (!) 153/83     Pulse Rate 06/22/21 1815 70     Resp 06/22/21 1815 18     Temp 06/22/21 1815 98.2 F (36.8 C)     Temp Source 06/22/21 1815 Oral     SpO2 06/22/21 1815 97 %     Weight 06/22/21 1815 162 lb 1 oz (73.5 kg)     Height 06/22/21 1815 5\' 4"  (1.626 m)     Head Circumference --      Peak Flow --      Pain Score 06/22/21 1818 1     Pain Loc --      Pain Edu? --      Excl. in [place]? --     Most recent vital signs: Vitals:   06/22/21 1815 06/22/21 1958  BP: (!) 153/83 (!) 179/79  Pulse: 70 62  Resp: 18 19  Temp: 98.2 F (36.8 C) 98 F (36.7 C)  SpO2: 97% 99%    General: Awake, no distress.  Pleasant and conversational in full sentences.  Ambulatory with a normal gait independently. CV:  Good peripheral perfusion.  RRR Resp:  Normal effort.  CTAB Abd:  No distention.  MSK:  Trace pitting edema to the right leg, slightly worse than the left.  As pictured below, has an area of  about 1 x 2 cm of induration, erythema and tenderness to palpation.  Not coming to ahead.  No purulence. Neuro:  No focal deficits appreciated. Cranial nerves II through XII intact 5/5 strength and sensation in all 4 extremities Other:       ED Results / Procedures / Treatments   Labs (all labs ordered are listed, but only abnormal results are displayed) Labs Reviewed  CBC WITH DIFFERENTIAL/PLATELET - Abnormal; Notable for the following components:      Result Value   Hemoglobin 12.8 (*)    All other components within normal limits  COMPREHENSIVE METABOLIC PANEL - Abnormal; Notable for the following components:   Sodium 132 (*)    Glucose, Bld 107 (*)    All other components within normal limits  BRAIN NATRIURETIC PEPTIDE - Abnormal; Notable for the following components:   B Natriuretic Peptide 112.1 (*)    All other components within normal limits  TROPONIN I (HIGH SENSITIVITY)  TROPONIN I (HIGH SENSITIVITY)    EKG  RADIOLOGY Venous ultrasound reviewed by me without evidence of DVT. Plain film of the right tib-fib reviewed by me without evidence of bony pathology. Bedside ultrasound performed by me looking specifically at that area of erythema and tenderness.  Utilizing linear probe, just beneath the skin, about a centimeter beneath the skin and the subcutaneous tissue, has a simple fluid collection without internal echogenicity.  Appears to be a simple cyst.  Official radiology report(s): DG Tibia/Fibula Right  Result Date: 06/22/2021 CLINICAL DATA:  painful red bump to antero lateral right mid shin, eval underlying bony path EXAM: RIGHT TIBIA AND FIBULA - 2 VIEW COMPARISON:  None. FINDINGS: No acute bony abnormality. Specifically, no fracture, subluxation, or dislocation. No bone destruction to suggest osteomyelitis. No soft tissue gas or radiopaque foreign body. Diffuse vascular calcifications. IMPRESSION: No acute bony abnormality. Electronically Signed   By: Rolm Baptise  M.D.   On: 06/22/2021 22:14   US Venous Img Lower Unilateral Right  Result Date: 06/22/2021 CLINICAL DATA:  Right lower leg swelling and pain EXAM: RIGHT LOWER EXTREMITY VENOUS DOPPLER ULTRASOUND TECHNIQUE: Gray-scale sonography with compression, as well as color and duplex ultrasound, were performed to evaluate the deep venous system(s) from the level of the common femoral vein through the popliteal and proximal calf veins. COMPARISON:  None. FINDINGS: VENOUS Normal compressibility of the common femoral, superficial femoral, and popliteal veins, as well as the visualized calf veins. Visualized portions of profunda femoral vein and great saphenous vein unremarkable. No filling defects to suggest DVT on grayscale or color Doppler imaging. Doppler waveforms show normal direction of venous flow, normal respiratory plasticity and response to augmentation. Limited views of the contralateral common femoral vein are unremarkable. OTHER None. Limitations: none IMPRESSION: Negative. Electronically Signed   By: Merilyn Baba M.D.   On: 06/22/2021 21:25    PROCEDURES and INTERVENTIONS:  Procedures  Medications - No data to display   IMPRESSION / MDM / [place] / ED COURSE  I reviewed the triage vital signs and the nursing notes.  81 year old male presents to the ED with subacute/chronic atraumatic area of pain to his right leg, possibly a subcutaneous cyst, and suitable for outpatient management with dermatology follow-up.  He looks clinically well to me.  He is ambulatory and pleasant.  No signs of systemic illness, neurologic or vascular deficits.  Blood work is benign without evidence of leukocytosis to suggest infectious pathology such as cellulitis or abscess.  Reassuring metabolic panel.  DVT ultrasound without evidence of DVT.  Plain film without evidence of bony pathology.  My bedside ultrasound with simple fluid collection concerning for a cyst.  No barriers to outpatient management.  He is  already well-established with dermatology and we will have him follow-up as an outpatient.  Return precautions discussed.  Clinical Course as of 06/22/21 2241  Fri Jun 22, 2021  2144 I perform bedside u/s,. We discuss possibilities [DS]  2230 Reassessed and discussed workup, recommendations and return precautions [DS]    Clinical Course User Index [DS] Vladimir Crofts, MD     FINAL CLINICAL IMPRESSION(S) / ED DIAGNOSES   Final diagnoses:  Right leg pain     Rx / DC Orders   ED Discharge Orders     None        Note:  This document was prepared using Dragon voice recognition software and may include unintentional dictation errors.   Vladimir Crofts, MD 06/22/21 [id number]

## 2021-06-25 ENCOUNTER — Encounter: Payer: Self-pay | Admitting: Dermatology

## 2021-06-25 NOTE — Telephone Encounter (Signed)
Noted, he plans to see dermatology per note

## 2021-06-25 NOTE — Telephone Encounter (Signed)
Noted.  Thanks.  Per EMR, was evaluated at ER.  Routed to PCP as FYI.

## 2021-06-28 ENCOUNTER — Encounter: Payer: Self-pay | Admitting: Podiatry

## 2021-06-28 ENCOUNTER — Other Ambulatory Visit: Payer: Self-pay

## 2021-06-28 ENCOUNTER — Ambulatory Visit (INDEPENDENT_AMBULATORY_CARE_PROVIDER_SITE_OTHER): Payer: Medicare Other | Admitting: Podiatry

## 2021-06-28 DIAGNOSIS — M79675 Pain in left toe(s): Secondary | ICD-10-CM | POA: Diagnosis not present

## 2021-06-28 DIAGNOSIS — M79674 Pain in right toe(s): Secondary | ICD-10-CM

## 2021-06-28 DIAGNOSIS — E119 Type 2 diabetes mellitus without complications: Secondary | ICD-10-CM | POA: Diagnosis not present

## 2021-06-28 DIAGNOSIS — B351 Tinea unguium: Secondary | ICD-10-CM | POA: Diagnosis not present

## 2021-06-28 NOTE — Progress Notes (Signed)
This patient returns to my office for at risk foot care.  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  This patient is unable to cut nails himself since the patient cannot reach his nails.These nails are painful walking and wearing shoes.  This patient presents for at risk foot care today. ? ?General Appearance  Alert, conversant and in no acute stress. ? ?Vascular  Dorsalis pedis and posterior tibial  pulses are palpable  bilaterally.  Capillary return is within normal limits  bilaterally. Temperature is within normal limits  bilaterally. ? ?Neurologic  Senn-Weinstein monofilament wire test within normal limits  bilaterally. Muscle power within normal limits bilaterally. ? ?Nails Thick disfigured discolored nails with subungual debris  from hallux to fifth toes right foot. No evidence of bacterial infection or drainage bilaterally. ? ?Orthopedic .  No crepitus or effusions noted.  No bony pathology or digital deformities noted. No rearfoot motion STJ and ankle  right foot. ? ?Skin  normotropic skin with no porokeratosis noted bilaterally.  No signs of infections or ulcers noted.    ? ?Onychomycosis  Pain in right toes  Pain in left toes ? ?Consent was obtained for treatment procedures.   Mechanical debridement of nails 1-5  bilaterally performed with a nail nipper.  Filed with dremel without incident.  ? ? ?Return office visit   3 months                   Told patient to return for periodic foot care and evaluation due to potential at risk complications. ? ? ?Gardiner Barefoot DPM   ?

## 2021-07-03 ENCOUNTER — Ambulatory Visit (INDEPENDENT_AMBULATORY_CARE_PROVIDER_SITE_OTHER): Payer: Medicare Other | Admitting: Surgery

## 2021-07-03 ENCOUNTER — Other Ambulatory Visit: Payer: Self-pay

## 2021-07-03 ENCOUNTER — Encounter: Payer: Self-pay | Admitting: Surgery

## 2021-07-03 VITALS — BP 167/70 | HR 76 | Temp 97.7°F | Ht 64.0 in | Wt 164.6 lb

## 2021-07-03 DIAGNOSIS — K5902 Outlet dysfunction constipation: Secondary | ICD-10-CM | POA: Diagnosis not present

## 2021-07-03 NOTE — Patient Instructions (Addendum)
Advised to pursue a goal of 25 to 30 g of fiber daily.  Made aware that the majority of this may be through natural sources, but advised to be aware of actual consumption and to ensure minimal consumption by daily supplementation.  Various forms of supplements discussed.  Recommended Psyllium husk, that mixes well with applesauce, or the powder which goes down well shaken with chocolate milk.  ?Strongly advised to consume more fluids to ensure adequate hydration, instructed to watch color of urine to determine adequacy of hydration.  Clarity is pursued in urine output, and bowel activity that correlates to significant meal intake.   ?We need to avoid deferring having bowel movements, advised to take the time at the first sign of sensation, typically following meals, and in the morning.   ?Subsequent utilization of MiraLAX may be needed ensure at least daily movement, ideally twice daily bowel movements.  If multiple doses of MiraLAX are necessary utilize them. ?Never skip a day...  ?To be regular, we must do the above EVERY day.  ? ? ?If you have any concerns or questions, please feel free to call our office. Follow up as needed.  ? ? ?Hemorrhoids ?Hemorrhoids are swollen veins that may develop: ?In the butt (rectum). These are called internal hemorrhoids. ?Around the opening of the butt (anus). These are called external hemorrhoids. ?Hemorrhoids can cause pain, itching, or bleeding. Most of the time, they do not cause serious problems. They usually get better with diet changes, lifestyle changes, and other home treatments. ?What are the causes? ?This condition may be caused by: ?Having trouble pooping (constipation). ?Pushing hard (straining) to poop. ?Watery poop (diarrhea). ?Pregnancy. ?Being very overweight (obese). ?Sitting for long periods of time. ?Heavy lifting or other activity that causes you to strain. ?Anal sex. ?Riding a bike for a long period of time. ?What are the signs or symptoms? ?Symptoms of this  condition include: ?Pain. ?Itching or soreness in the butt. ?Bleeding from the butt. ?Leaking poop. ?Swelling in the area. ?One or more lumps around the opening of your butt. ?How is this diagnosed? ?A doctor can often diagnose this condition by looking at the affected area. The doctor may also: ?Do an exam that involves feeling the area with a gloved hand (digital rectal exam). ?Examine the area inside your butt using a small tube (anoscope). ?Order blood tests. This may be done if you have lost a lot of blood. ?Have you get a test that involves looking inside the colon using a flexible tube with a camera on the end (sigmoidoscopy or colonoscopy). ?How is this treated? ?This condition can usually be treated at home. Your doctor may tell you to change what you eat, make lifestyle changes, or try home treatments. If these do not help, procedures can be done to remove the hemorrhoids or make them smaller. These may involve: ?Placing rubber bands at the base of the hemorrhoids to cut off their blood supply. ?Injecting medicine into the hemorrhoids to shrink them. ?Shining a type of light energy onto the hemorrhoids to cause them to fall off. ?Doing surgery to remove the hemorrhoids or cut off their blood supply. ?Follow these instructions at home: ?Eating and drinking ? ?Eat foods that have a lot of fiber in them. These include whole grains, beans, nuts, fruits, and vegetables. ?Ask your doctor about taking products that have added fiber (fibersupplements). ?Reduce the amount of fat in your diet. You can do this by: ?Eating low-fat dairy products. ?Eating less red meat. ?Avoiding  processed foods. ?Drink enough fluid to keep your pee (urine) pale yellow. ?Managing pain and swelling ? ?Take a warm-water bath (sitz bath) for 20 minutes to ease pain. Do this 3-4 times a day. You may do this in a bathtub or using a portable sitz bath that fits over the toilet. ?If told, put ice on the painful area. It may be helpful to use  ice between your warm baths. ?Put ice in a plastic bag. ?Place a towel between your skin and the bag. ?Leave the ice on for 20 minutes, 2-3 times a day. ?General instructions ?Take over-the-counter and prescription medicines only as told by your doctor. ?Medicated creams and medicines may be used as told. ?Exercise often. Ask your doctor how much and what kind of exercise is best for you. ?Go to the bathroom when you have the urge to poop. Do not wait. ?Avoid pushing too hard when you poop. ?Keep your butt dry and clean. Use wet toilet paper or moist towelettes after pooping. ?Do not sit on the toilet for a long time. ?Keep all follow-up visits as told by your doctor. This is important. ?Contact a doctor if you: ?Have pain and swelling that do not get better with treatment or medicine. ?Have trouble pooping. ?Cannot poop. ?Have pain or swelling outside the area of the hemorrhoids. ?Get help right away if you have: ?Bleeding that will not stop. ?Summary ?Hemorrhoids are swollen veins in the butt or around the opening of the butt. ?They can cause pain, itching, or bleeding. ?Eat foods that have a lot of fiber in them. These include whole grains, beans, nuts, fruits, and vegetables. ?Take a warm-water bath (sitz bath) for 20 minutes to ease pain. Do this 3-4 times a day. ?This information is not intended to replace advice given to you by your health care provider. Make sure you discuss any questions you have with your health care provider. ? ?Constipation, Adult ?Constipation is when a person has trouble pooping (having a bowel movement). When you have this condition, you may poop fewer than 3 times a week. Your poop (stool) may also be dry, hard, or bigger than normal. ?Follow these instructions at home: ?Eating and drinking ? ?Eat foods that have a lot of fiber, such as: ?Fresh fruits and vegetables. ?Whole grains. ?Beans. ?Eat less of foods that are low in fiber and high in fat and sugar, such as: ?[place]  fries. ?Hamburgers. ?Cookies. ?Candy. ?Soda. ?Drink enough fluid to keep your pee (urine) pale yellow. ?General instructions ?Exercise regularly or as told by your doctor. Try to do 150 minutes of exercise each week. ?Go to the restroom when you feel like you need to poop. Do not hold it in. ?Take over-the-counter and prescription medicines only as told by your doctor. These include any fiber supplements. ?When you poop: ?Do deep breathing while relaxing your lower belly (abdomen). ?Relax your pelvic floor. The pelvic floor is a group of muscles that support the rectum, bladder, and intestines (as well as the uterus in women). ?Watch your condition for any changes. Tell your doctor if you notice any. ?Keep all follow-up visits as told by your doctor. This is important. ?Contact a doctor if: ?You have pain that gets worse. ?You have a fever. ?You have not pooped for 4 days. ?You vomit. ?You are not hungry. ?You lose weight. ?You are bleeding from the opening of the butt (anus). ?You have thin, pencil-like poop. ?Get help right away if: ?You have a fever, and your  symptoms suddenly get worse. ?You leak poop or have blood in your poop. ?Your belly feels hard or bigger than normal (bloated). ?You have very bad belly pain. ?You feel dizzy or you faint. ?Summary ?Constipation is when a person poops fewer than 3 times a week, has trouble pooping, or has poop that is dry, hard, or bigger than normal. ?Eat foods that have a lot of fiber. ?Drink enough fluid to keep your pee (urine) pale yellow. ?Take over-the-counter and prescription medicines only as told by your doctor. These include any fiber supplements. ?This information is not intended to replace advice given to you by your health care provider. Make sure you discuss any questions you have with your health care provider. ?Document Revised: 03/03/2019 Document Reviewed: 03/03/2019 ?Elsevier Patient Education ? [place]. ? ?

## 2021-07-04 DIAGNOSIS — K5902 Outlet dysfunction constipation: Secondary | ICD-10-CM | POA: Insufficient documentation

## 2021-07-04 NOTE — Progress Notes (Signed)
Patient ID: Cory [name], male   DOB: 05/09/1940, 81 y.o.   MRN: [id number]  Chief Complaint: Prolapsing hemorrhoid  History of Present Illness Cory [name] is a 81 y.o. 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  Most recent episode was just yesterday.  He reports when he strains and requires bearing down/extra effort he has a hemorrhoid that protrudes and he feels the need to mechanically reduce it.  He has never given it time to see if it reduces on its own.  He reports his bowel movements are typically daily but he does skip days and he does have times when he strains significantly.  He does have times when there are difficult to pass bowel movements that go from firm, to very soft.  He denies bleeding.  He denies itching.  He denies pain.  He feels it inhibits his bowel movement.  He has not utilized any medications or sits baths.  He does not take any laxatives or fiber supplements.  He does take prunes on a regular basis along with oatmeal.  He reports he avoids consuming significant volumes of water for urological reasons.  And notes this could be a place where improvement can happen.  His last colonoscopy was 10 years ago..  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   Left scalp - [facility]   Stroke ([place])    on Plavix and ASA   Vasovagal syncope       Past Surgical History:  Procedure Laterality Date   BACK SURGERY     CHOLECYSTECTOMY     CORONARY ARTERY BYPASS GRAFT  2000   DEBRIDEMENT AND CLOSURE WOUND N/A 01/19/2021   Procedure: Closure of scalp defect;  Surgeon: Cindra Presume, MD;  Location: [facility];  Service: Plastics;  Laterality: N/A;   NECK SURGERY      Allergies  Allergen Reactions   Fesoterodine Other (See Comments)    Dry mouth and constipation   Isosorbide Other (See Comments)   Tramadol Other (See Comments)   Grass Pollen(K-O-R-T-Swt Vern) Other (See Comments)   Hydrocodone Anxiety   Meperidine Other (See Comments) and Rash    Other reaction(s): Other (See Comments) HALLUCINATIONS    Penicillins Rash    Current Outpatient Medications  Medication Sig Dispense Refill   acetaminophen (TYLENOL) 500 MG tablet Take 1,000 mg by mouth at bedtime.     amLODipine (NORVASC) 5 MG tablet TAKE 1 TABLET EVERY DAY 90 tablet 1   aspirin EC 81 MG tablet Take 1 tablet (81 mg total) by mouth daily. Swallow whole. 90 tablet 3   atorvastatin (LIPITOR) 40 MG tablet TAKE 1 TABLET EVERY DAY 90 tablet 0   cloNIDine (CATAPRES) 0.1 MG tablet Take 1 tablet (0.1 mg total) by mouth 2 (two) times daily. 180 tablet 3   clopidogrel (PLAVIX) 75 MG tablet TAKE 1 TABLET EVERY DAY 90 tablet 3   cycloSPORINE (RESTASIS) 0.05 % ophthalmic emulsion 1 drop 2 (two) times a day.     diclofenac Sodium (VOLTAREN) 1 % GEL Apply 2 g  topically as needed.     escitalopram (LEXAPRO) 5 MG tablet Take 10 mg by mouth daily.     famotidine (PEPCID) 20 MG tablet Take 20 mg by mouth at bedtime.     fluticasone (FLONASE) 50 MCG/ACT nasal spray Place 1 spray into both nostrils daily. 16 g 3   Iron, Ferrous Sulfate, 325 (65 Fe) MG TABS Take 325 mg by mouth daily. 90 tablet 1   latanoprost (XALATAN) 0.005 % ophthalmic solution daily.     lisinopril (ZESTRIL) 10 MG tablet TAKE 1 TABLET (10 MG TOTAL) BY MOUTH DAILY. 90 tablet 3   melatonin 5 MG TABS Take 5 mg by mouth at bedtime.     metFORMIN (GLUCOPHAGE) 500 MG tablet TAKE 1 TABLET TWICE DAILY WITH MEALS 180 tablet 1   montelukast (SINGULAIR) 10 MG tablet TAKE 1 TABLET (10 MG TOTAL) BY MOUTH AT BEDTIME. 90 tablet 3   nitroGLYCERIN (NITROSTAT) 0.4 MG SL tablet Place 1  tablet (0.4 mg total) under the tongue every 5 (five) minutes as needed. 5 tablet 0   pantoprazole (PROTONIX) 40 MG tablet Take 1 tablet (40 mg total) by mouth daily. 90 tablet 3   tamsulosin (FLOMAX) 0.4 MG CAPS capsule Take 1 capsule (0.4 mg total) by mouth daily. 90 capsule 3   Vibegron (GEMTESA) 75 MG TABS Take 75 mg by mouth daily. 90 tablet 3   No current facility-administered medications for this visit.    Family History Family History  Problem Relation Age of Onset   Heart Problems Mother    Heart disease Mother    Heart Problems Father    Heart attack Father    Heart disease Brother       Social History Social History   Tobacco Use   Smoking status: Former    Types: Pipe   Smokeless tobacco: Never  Scientific laboratory technician Use: Never used  Substance Use Topics   Alcohol use: Never   Drug use: Never        Review of Systems  Constitutional: Negative.   HENT:  Positive for hearing loss and nosebleeds.   Eyes:  Positive for pain.  Respiratory: Negative.    Cardiovascular:  Positive for leg swelling.  Gastrointestinal:  Positive for constipation and heartburn.  Genitourinary:  Positive for urgency.  Skin:  Positive for itching.  Neurological: Negative.   Psychiatric/Behavioral: Negative.       Physical Exam Blood pressure (!) 167/70, pulse 76, temperature 97.7 F (36.5 C), temperature source Oral, height '5\' 4"'$  (1.626 m), weight 164 lb 9.6 oz (74.7 kg), SpO2 94 %. Last Weight  Most recent update: 07/03/2021  2:15 PM    Weight  74.7 kg (164 lb 9.6 oz)             CONSTITUTIONAL: Well developed, and nourished, appropriately responsive and aware without distress.   EYES: Sclera non-icteric.   EARS, NOSE, MOUTH AND THROAT: Mask worn.   Hearing is intact to voice.  NECK: Trachea is midline, and there is no jugular venous distension.  LYMPH NODES:  Lymph nodes in the neck are not enlarged. RESPIRATORY:  Lungs are clear, and breath sounds are equal bilaterally.  Normal respiratory effort without pathologic use of accessory muscles. CARDIOVASCULAR: Heart is regular in rate and rhythm. GI: The abdomen is  soft, nontender, and nondistended. There were no palpable masses. I did not appreciate hepatosplenomegaly.  GU: Remarkably on digital rectal exam, there are no external hemorrhoids and no internal hemorrhoidal redundancy.  There seems to be a degree of any elasticity of the anal sphincter.  I do not believe this is due to high sphincter tone but perhaps due to scarring.  There are no anal canal or distal rectal masses.  His prostate is quite enlarged and somewhat tender on exam.  Again I do not appreciate any remarkable hemorrhoidal piles or hemorrhoidal redundancy that would protrude with bowel movement.  No bright red blood.  No stool in the rectal vault. MUSCULOSKELETAL:  Symmetrical muscle tone appreciated in all four extremities.    SKIN: Skin turgor is normal. No pathologic skin lesions appreciated.  Crusted skin lesion on top of forehead.  NEUROLOGIC:  Motor and sensation appear grossly normal.  Cranial nerves are grossly without defect. PSYCH:  Alert and oriented to person, place and time. Affect is appropriate for situation.  Data Reviewed I have personally reviewed what is currently available of the patient's imaging, recent labs and medical records.   Labs:  CBC Latest Ref Rng & Units 06/22/2021 04/04/2021 01/29/2021  WBC 4.0 - 10.5 K/uL 6.4 9.7 6.1  Hemoglobin 13.0 - 17.0 g/dL 12.8(L) 12.6(L) 10.7(L)  Hematocrit 39.0 - 52.0 % 39.7 38.4(L) 33.2(L)  Platelets 150 - 400 K/uL 197 199 237.0   CMP Latest Ref Rng & Units 06/22/2021 06/06/2021 05/01/2021  Glucose 70 - 99 mg/dL 107(H) - 83  BUN 8 - 23 mg/dL 15 - 11  Creatinine 0.61 - 1.24 mg/dL 1.17 0.90 0.91  Sodium 135 - 145 mmol/L 132(L) - 131(L)  Potassium 3.5 - 5.1 mmol/L 4.6 - 4.9  Chloride 98 - 111 mmol/L 99 - 98  CO2 22 - 32 mmol/L 24 - 23  Calcium 8.9 - 10.3 mg/dL 8.9 - 9.0  Total Protein 6.5 -  8.1 g/dL 6.5 - 6.3  Total Bilirubin 0.3 - 1.2 mg/dL 0.6 - 0.5  Alkaline Phos 38 - 126 U/L 44 - 48  AST 15 - 41 U/L 30 - 27  ALT 0 - 44 U/L 22 - 21      Imaging:  Within last 24 hrs: No results found.  Assessment    He may be noting a degree of physiologic distal rectal prolapse associated with excessive straining.  There is a degree of outlet irregularity which may be challenging to correct if any presenting physiologic obstruction is the issue. Patient Active Problem List   Diagnosis Date Noted   Iron deficiency anemia 05/02/2021   Acute non-recurrent frontal sinusitis 05/02/2021   Hyponatremia 05/02/2021   Memory impairment 05/02/2021   Type 2 diabetes mellitus with hyperglycemia, without long-term current use of insulin ([place]) 05/02/2021   History of basal cell carcinoma (BCC) 08/30/2020   Trigger index finger of left hand 07/10/2020   Blurry vision 07/10/2020   Aneurysm ([facility]) 05/11/2020   TIA (transient ischemic attack) 05/06/2020   Osteoarthritis 04/13/2020   Hypertension 04/13/2020   CAD (coronary artery disease), native coronary artery 04/13/2020   History of CVA with residual deficit 04/13/2020   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 ([place]) 04/13/2020   Hiatal hernia 04/13/2020   Seasonal allergies 04/13/2020   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  I believe twice daily fiber supplementation with intentional  rehydration during the earlier part of the day would be very helpful in terms of providing some easier to move stool consistency.  I suspect this may help him resolve what concerns him.  I will be glad to see him again should this not improve with the regimen as discussed, encouraged below.  Advised to pursue a goal of 25 to 30 g of fiber daily.  Made aware that the majority of this may be through natural sources, but advised to be aware of actual consumption and to ensure minimal consumption by daily supplementation.  Various forms of supplements discussed.  Recommended Psyllium husk, that mixes well with applesauce, or the powder which goes down well shaken with chocolate milk.  Strongly advised to consume more fluids to ensure adequate hydration, instructed to watch color of urine to determine adequacy of hydration.  Clarity is pursued in urine output, and bowel activity that correlates to significant meal intake.   We need to avoid deferring having bowel movements, advised to take the time at the first sign of sensation, typically following meals, and in the morning.   Subsequent utilization of MiraLAX may be needed ensure at least daily movement, ideally twice daily bowel movements.  If multiple doses of MiraLAX are necessary utilize them. Never skip a day...  To be regular, we must do the above EVERY day.    Face-to-face time spent with the patient and accompanying care providers(if present) was 45 minutes, with more than 50% of the time spent counseling, educating, and coordinating care of the patient.    These notes generated with voice recognition software. I apologize for typographical errors.  Cory [name] M.D., FACS 07/04/2021, 8:40 AM

## 2021-07-15 ENCOUNTER — Encounter: Payer: Self-pay | Admitting: Family Medicine

## 2021-07-15 DIAGNOSIS — K644 Residual hemorrhoidal skin tags: Secondary | ICD-10-CM

## 2021-07-19 ENCOUNTER — Telehealth: Payer: Self-pay

## 2021-07-19 NOTE — Telephone Encounter (Signed)
SCHEDULED FOR 11/08/2021 ?

## 2021-07-31 ENCOUNTER — Ambulatory Visit: Payer: Medicare Other | Admitting: Family

## 2021-08-01 ENCOUNTER — Other Ambulatory Visit: Payer: Self-pay | Admitting: Family Medicine

## 2021-08-01 DIAGNOSIS — I6930 Unspecified sequelae of cerebral infarction: Secondary | ICD-10-CM

## 2021-08-06 ENCOUNTER — Encounter: Payer: Self-pay | Admitting: Internal Medicine

## 2021-08-06 ENCOUNTER — Ambulatory Visit (INDEPENDENT_AMBULATORY_CARE_PROVIDER_SITE_OTHER): Payer: Medicare Other | Admitting: Internal Medicine

## 2021-08-06 VITALS — BP 130/70 | HR 71 | Ht 64.0 in | Wt 166.8 lb

## 2021-08-06 DIAGNOSIS — E871 Hypo-osmolality and hyponatremia: Secondary | ICD-10-CM | POA: Diagnosis not present

## 2021-08-06 LAB — BASIC METABOLIC PANEL
BUN: 15 mg/dL (ref 6–23)
CO2: 26 mEq/L (ref 19–32)
Calcium: 9.4 mg/dL (ref 8.4–10.5)
Chloride: 96 mEq/L (ref 96–112)
Creatinine, Ser: 0.87 mg/dL (ref 0.40–1.50)
GFR: 80.47 mL/min (ref >60.00)
Glucose, Bld: 83 mg/dL (ref 70–99)
Potassium: 5.2 mEq/L — ABNORMAL HIGH (ref 3.5–5.1)
Sodium: 130 mEq/L — ABNORMAL LOW (ref 135–145)

## 2021-08-06 NOTE — Progress Notes (Signed)
? ? ?Name: ZOHAIR EPP III  ?MRN/ DOB: [id number], May 03, 1940    ?Age/ Sex: 81 y.o., male   ? ?PCP: [name], MD   ?Reason for Endocrinology Evaluation: Hyponatremia  ?   ?Date of Initial Endocrinology Evaluation: 08/06/2021   ? ? ?HPI: ?Mr. ANNETTE LIOTTA III is a 81 y.o. male with a past medical history of HTN, CAD, Hx of CVA, Barrett's esophagus, and T2DM. The patient presented for initial endocrinology clinic visit on 08/06/2021 for consultative assistance with his hyponatremia.  ? ?Patient has been noted with hyponatremia in 2022 with a nadir of 128 mmol/L in  03/2021 ? ? ?He has occasional dizziness over the past few days  ?He also has recent diarrhea  ?He denies weight loss  ?He had recent nausea just a few days ago but not chronic  ?Had a vasovagal even a few weeks ago  ? ?He drinks ~ 2-3 cups of coffee each morning, glass of teat at night and around 1 bottle of water .  ? ?Denies head injusry  ?He is scheduled to see dermatology next week . Has hx of Moh's scalp sx  ? ?Has LE edema , wears compression socks daily  ?No Hx of thyroid disease  ? ?Had leg  pain several weeks ago  ?He takes Tylenol but no NSAID's  ? ? ?HISTORY:  ?Past Medical History:  ?Past Medical History:  ?Diagnosis Date  ? Anxiety   ? CAD (coronary artery disease)   ? CABG  ? Depression   ? Diabetes mellitus without complication ([place])   ? GERD (gastroesophageal reflux disease)   ? Heart murmur   ? Hyperlipidemia   ? Hypertension   ? SCC (squamous cell carcinoma) 11/14/2020  ? 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 Left scalp - [facility]  ? Stroke [facility])   ? on Plavix and ASA  ? Vasovagal syncope   ? ?Past Surgical History:  ?Past Surgical History:  ?Procedure Laterality Date  ? BACK SURGERY    ? CHOLECYSTECTOMY    ? CORONARY ARTERY BYPASS GRAFT  2000  ? DEBRIDEMENT AND CLOSURE WOUND N/A 01/19/2021  ? Procedure: Closure of scalp defect;  Surgeon: Cindra Presume, MD;  Location: [facility];  Service: Plastics;  Laterality: N/A;  ? NECK SURGERY    ?  ?Social History:  reports that he has quit smoking. His smoking use included pipe. He has never used smokeless tobacco. He reports that he does not drink alcohol and does not use drugs. ?Family History: family history includes Heart Problems in his father and mother; Heart attack in his father; Heart disease in his brother and mother. ? ? ?HOME MEDICATIONS: ?Allergies as of 08/06/2021   ? ?   Reactions  ? Fesoterodine Other (See Comments)  ? Dry mouth and constipation  ? Isosorbide Other (See Comments)  ? Tramadol Other (See Comments)  ? Grass Pollen(k-o-r-t-swt Vern) Other (See Comments)  ? Hydrocodone Anxiety  ? Meperidine Other (See Comments), Rash  ? Other reaction(s): Other (See Comments) ?HALLUCINATIONS  ? Penicillins Rash  ? ?  ? ?  ?Medication List  ?  ? ?  ? Accurate as of August 06, 2021  1:36 PM. If you have any questions, ask your nurse or doctor.  ?  ?  ? ?  ? ?acetaminophen 500 MG tablet ?Commonly known as: TYLENOL ?Take 1,000 mg by mouth at bedtime. ?  ?amLODipine  5 MG tablet ?Commonly known as: NORVASC ?TAKE 1 TABLET EVERY DAY ?  ?aspirin EC 81 MG tablet ?Take 1 tablet (81 mg total) by mouth daily. Swallow whole. ?  ?atorvastatin 40 MG tablet ?Commonly known as: LIPITOR ?TAKE 1 TABLET EVERY DAY ?  ?cloNIDine 0.1 MG tablet ?Commonly known as: CATAPRES ?Take 1 tablet (0.1 mg total) by mouth 2 (two) times daily. ?  ?clopidogrel 75 MG tablet ?Commonly known as: PLAVIX ?TAKE 1 TABLET EVERY DAY ?  ?cycloSPORINE 0.05 % ophthalmic emulsion ?Commonly known as: RESTASIS ?1 drop 2 (two) times a day. ?  ?diclofenac Sodium 1 % Gel ?Commonly known as: VOLTAREN ?Apply 2 g topically as needed. ?  ?escitalopram 5 MG tablet ?Commonly known as: LEXAPRO ?Take 10 mg by mouth daily. ?  ?famotidine 20 MG tablet ?Commonly known as: PEPCID ?Take 20 mg by mouth at bedtime. ?  ?fluticasone 50 MCG/ACT nasal spray ?Commonly known as: FLONASE ?Place 1 spray  into both nostrils daily. ?  ?Gemtesa 75 MG Tabs ?Generic drug: Vibegron ?Take 75 mg by mouth daily. ?  ?Iron (Ferrous Sulfate) 325 (65 Fe) MG Tabs ?Take 325 mg by mouth daily. ?  ?latanoprost 0.005 % ophthalmic solution ?Commonly known as: XALATAN ?daily. ?  ?lisinopril 10 MG tablet ?Commonly known as: ZESTRIL ?TAKE 1 TABLET (10 MG TOTAL) BY MOUTH DAILY. ?  ?melatonin 5 MG Tabs ?Take 5 mg by mouth at bedtime. ?  ?metFORMIN 500 MG tablet ?Commonly known as: GLUCOPHAGE ?TAKE 1 TABLET TWICE DAILY WITH MEALS ?  ?montelukast 10 MG tablet ?Commonly known as: SINGULAIR ?TAKE 1 TABLET (10 MG TOTAL) BY MOUTH AT BEDTIME. ?  ?nitroGLYCERIN 0.4 MG SL tablet ?Commonly known as: NITROSTAT ?Place 1 tablet (0.4 mg total) under the tongue every 5 (five) minutes as needed. ?  ?pantoprazole 40 MG tablet ?Commonly known as: PROTONIX ?Take 1 tablet (40 mg total) by mouth daily. ?  ?tamsulosin 0.4 MG Caps capsule ?Commonly known as: FLOMAX ?Take 1 capsule (0.4 mg total) by mouth daily. ?  ? ?  ?  ? ? ?REVIEW OF SYSTEMS: ?A comprehensive ROS was conducted with the patient and is negative except as per HPI  ? ? ?OBJECTIVE:  ?VS: BP 130/70 (BP Location: Left Arm, Patient Position: Sitting, Cuff Size: Small)   Pulse 71   Ht '5\' 4"'$  (1.626 m)   Wt 166 lb 12.8 oz (75.7 kg)   SpO2 99%   BMI 28.63 kg/m?   ? ?Wt Readings from Last 3 Encounters:  ?08/06/21 166 lb 12.8 oz (75.7 kg)  ?07/03/21 164 lb 9.6 oz (74.7 kg)  ?06/22/21 162 lb 1 oz (73.5 kg)  ? ? ? ?EXAM: ?General: Pt appears well and is in NAD  ?Neck: General: Supple without adenopathy. ?Thyroid: Thyroid size normal.  No goiter or nodules appreciated.   ?Lungs: Clear with good BS bilat with no rales, rhonchi, or wheezes  ?Heart: Auscultation: RRR.+ systolic murmur   ?Abdomen: Normoactive bowel sounds, soft, nontender, without masses or organomegaly palpable  ?Extremities:  ?BL LE: 1+ pretibial edema   ?Mental Status: Judgment, insight: Intact ?Orientation: Oriented to time, place, and  person ?Mood and affect: No depression, anxiety, or agitation  ? ? ? ?DATA REVIEWED: ? Latest Reference Range & Units 08/06/21 13:57  ?Sodium 135 - 145 mEq/L 130 (L)  ?Potassium 3.5 - 5.1 mEq/L 5.2 No hemolysis seen (H)  ?Chloride 96 - 112 mEq/L 96  ?CO2 19 - 32 mEq/L 26  ?Glucose 70 - 99 mg/dL 83  ?  BUN 6 - 23 mg/dL 15  ?Creatinine 0.40 - 1.50 mg/dL 0.87  ?Calcium 8.4 - 10.5 mg/dL 9.4  ?GFR >60.00 mL/min 80.47  ? ? Latest Reference Range & Units 08/06/21 13:57  ?Osmolality 278 - 305 mOsm/kg 279  ? ? ? Latest Reference Range & Units 08/06/21 13:57  ?Osmolality, Urine 50 - 1,200 mOsm/kg 499  ?Sodium, Urine 28 - 272 mmol/L 21 (L)  ? ?  ? ?ASSESSMENT/PLAN/RECOMMENDATIONS:  ? ?Hyponatremia: ? ?Hyponatremia is due to low effective arterial blood volume which is evidenced by peripheral edema, the cause of this is unclear to me, as I don't see Hx of CHF or Cirrhosis in the chart, but the patient does have a systolic murmur, and I do suspect this is cardiac in origin.  Low effective arterial blood volume will cause release of ADH. Typically those patient do not respond very well to fluid restriction and it may be necessary to treat the underlying cause of the edema.  ? ?-Serum sodium goal at or above 130 mmol/L ?-His serum osmolality is low, his urine sodium< 30 at 21 mmol/L , this excludes SIADH as a diagnosis, which at this point his hyponatremia is beyond the scope of endocrinology ?-Given that he was also noted with hyperkalemia, will proceed with cosyntropin stimulation test ? ? ?Follow-up in 4 months ? ?Signed electronically by: ?Abby Nena Jordan, MD ? ?[facility]  ?[facility] ?[address] ?[place], [place] [address] ?Phone: [phone] ?FAX: [phone] ? ? ?CC: ?[name], MD ?[place] [address] ?Phone: [phone] ?Fax: [phone] ? ? ?Return to Endocrinology clinic as below: ?Future Appointments  ?Date Time Provider [place]   ?08/13/2021  1:30 PM Ralene Bathe, MD [facility] None  ?09/18/2021  2:20 PM Wellington Hampshire, MD [facility]  ?10/04/2021  1:30 PM Gardiner Barefoot, DPM [facility]  ?11/08/2021  2:45 PM Jonathon Bellows,

## 2021-08-08 LAB — OSMOLALITY: Osmolality: 279 mOsm/kg (ref 278–305)

## 2021-08-08 LAB — OSMOLALITY, URINE: Osmolality, Ur: 499 mOsm/kg (ref 50–1200)

## 2021-08-08 LAB — SODIUM, URINE, RANDOM: Sodium, Ur: 21 mmol/L — ABNORMAL LOW (ref 28–272)

## 2021-08-09 ENCOUNTER — Telehealth: Payer: Self-pay | Admitting: Internal Medicine

## 2021-08-09 DIAGNOSIS — E871 Hypo-osmolality and hyponatremia: Secondary | ICD-10-CM

## 2021-08-09 NOTE — Telephone Encounter (Signed)
Patient has been scheduled and is aware of the time frame.  ?

## 2021-08-09 NOTE — Telephone Encounter (Signed)
Please let the patient know that his sodium level continues to be low and his potassium is elevated and based on that information I will need to check on cortisol levels for him. ? ? ? ?Please schedule the patient for cosyntropin stimulation test, please let him know that he will be here for 1.5 hours and this will require receiving an injection of a synthetic hormone followed by blood work 30 and 60 minutes after the injection ? ? ? ?Thank you ?

## 2021-08-13 ENCOUNTER — Ambulatory Visit: Payer: Medicare Other | Admitting: Dermatology

## 2021-08-15 ENCOUNTER — Ambulatory Visit: Payer: Medicare Other

## 2021-08-15 ENCOUNTER — Ambulatory Visit (INDEPENDENT_AMBULATORY_CARE_PROVIDER_SITE_OTHER): Payer: Medicare Other | Admitting: Dermatology

## 2021-08-15 DIAGNOSIS — R21 Rash and other nonspecific skin eruption: Secondary | ICD-10-CM

## 2021-08-15 DIAGNOSIS — L859 Epidermal thickening, unspecified: Secondary | ICD-10-CM | POA: Diagnosis not present

## 2021-08-15 DIAGNOSIS — L820 Inflamed seborrheic keratosis: Secondary | ICD-10-CM | POA: Diagnosis not present

## 2021-08-15 DIAGNOSIS — L578 Other skin changes due to chronic exposure to nonionizing radiation: Secondary | ICD-10-CM

## 2021-08-15 DIAGNOSIS — Z85828 Personal history of other malignant neoplasm of skin: Secondary | ICD-10-CM

## 2021-08-15 DIAGNOSIS — L570 Actinic keratosis: Secondary | ICD-10-CM | POA: Diagnosis not present

## 2021-08-15 NOTE — Patient Instructions (Addendum)
Recommend starting 40% Urea cream at bedtime and CLN cleanser daily ? ? ? ?If You Need Anything After Your Visit ? ?If you have any questions or concerns for your doctor, please call our main line at [phone] and press option 4 to reach your doctor's medical assistant. If no one answers, please leave a voicemail as directed and we will return your call as soon as possible. Messages left after 4 pm will be answered the following business day.  ? ?You may also send [place] a message via MyChart. We typically respond to MyChart messages within 1-2 business days. ? ?For prescription refills, please ask your pharmacy to contact our office. Our fax number is [phone]. ? ?If you have an urgent issue when the clinic is closed that cannot wait until the next business day, you can page your doctor at the number below.   ? ?Please note that while we do our best to be available for urgent issues outside of office hours, we are not available 24/7.  ? ?If you have an urgent issue and are unable to reach [place], you may choose to seek medical care at your doctor's office, retail clinic, urgent care center, or emergency room. ? ?If you have a medical emergency, please immediately call 911 or go to the emergency department. ? ?Pager Numbers ? ?- Dr. Nehemiah Massed: [phone] ? ?- Dr. Laurence Ferrari: [phone] ? ?- Dr. Nicole Kindred: [phone] ? ?In the event of inclement weather, please call our main line at [phone] for an update on the status of any delays or closures. ? ?Dermatology Medication Tips: ?Please keep the boxes that topical medications come in in order to help keep track of the instructions about where and how to use these. Pharmacies typically print the medication instructions only on the boxes and not directly on the medication tubes.  ? ?If your medication is too expensive, please contact our office at [phone] option 4 or send [place] a message through [place].  ? ?We are unable to tell what your co-pay for medications will  be in advance as this is different depending on your insurance coverage. However, we may be able to find a substitute medication at lower cost or fill out paperwork to get insurance to cover a needed medication.  ? ?If a prior authorization is required to get your medication covered by your insurance company, please allow [place] 1-2 business days to complete this process. ? ?Drug prices often vary depending on where the prescription is filled and some pharmacies may offer cheaper prices. ? ?The website www.goodrx.com contains coupons for medications through different pharmacies. The prices here do not account for what the cost may be with help from insurance (it may be cheaper with your insurance), but the website can give you the price if you did not use any insurance.  ?- You can print the associated coupon and take it with your prescription to the pharmacy.  ?- You may also stop by our office during regular business hours and pick up a GoodRx coupon card.  ?- If you need your prescription sent electronically to a different pharmacy, notify our office through [facility] or by phone at [phone] option 4. ? ? ? ? ?Si Usted Necesita Algo Despu?s de Su Visita ? ?Tambi?n puede enviarnos un mensaje a trav?s de MyChart. Por lo general respondemos a los mensajes de MyChart en el transcurso de 1 a 2 d?as h?biles. ? ?Para renovar recetas, por favor pida a su farmacia que se ponga en  contacto con nuestra oficina. Nuestro n?mero de fax es el [phone]. ? ?Si tiene un asunto urgente cuando la cl?nica est? cerrada y que no puede esperar hasta el siguiente d?a h?bil, puede llamar/localizar a su doctor(a) al n?mero que aparece a continuaci?n.  ? ?Por favor, tenga en cuenta que aunque hacemos todo lo posible para estar disponibles para asuntos urgentes fuera del horario de oficina, no estamos disponibles las 24 horas del d?a, los 7 d?as de la semana.  ? ?Si tiene un problema urgente y no puede comunicarse con nosotros,  puede optar por buscar atenci?n m?dica  en el consultorio de su doctor(a), en una cl?nica privada, en un centro de atenci?n urgente o en una sala de emergencias. ? ?Si tiene Engineer, maintenance (IT) m?dica, por favor llame inmediatamente al 911 o vaya a la sala de emergencias. ? ?N?meros de b?per ? ?- Dr. Nehemiah Massed: [phone] ? ?- Dra. Moye: [phone] ? ?- Dra. Nicole Kindred: [phone] ? ?En caso de inclemencias del tiempo, por favor llame a nuestra l?nea principal al [phone] para una actualizaci?n sobre el estado de cualquier retraso o cierre. ? ?Consejos para la medicaci?n en dermatolog?a: ?Por favor, guarde las cajas en las que vienen los medicamentos de uso t?pico para ayudarle a seguir las instrucciones sobre d?nde y c?mo usarlos. Las farmacias generalmente imprimen las instrucciones del medicamento s?lo en las cajas y no directamente en los tubos del [place].  ? ?Si su medicamento es muy caro, por favor, p?ngase en contacto con Zigmund Daniel llamando al [phone] y presione la opci?n 4 o env?enos un mensaje a trav?s de MyChart.  ? ?No podemos decirle cu?l ser? su copago por los medicamentos por adelantado ya que esto es diferente dependiendo de la cobertura de su seguro. Sin embargo, es posible que podamos encontrar un medicamento sustituto a Electrical engineer un formulario para que el seguro cubra el medicamento que se considera necesario.  ? ?Si se requiere Ardelia Mems autorizaci?n previa para que su compa??a de seguros [place] su medicamento, por favor perm?tanos de 1 a 2 d?as h?biles para completar este proceso. ? ?Los precios de los medicamentos var?an con frecuencia dependiendo del Environmental consultant de d?nde se surte la receta y alguna farmacias pueden ofrecer precios m?s baratos. ? ?El sitio web www.goodrx.com tiene cupones para medicamentos de Airline pilot. Los precios aqu? no tienen en cuenta lo que podr?a costar con la ayuda del seguro (puede ser m?s barato con su seguro), pero el sitio web puede darle el  precio si no utiliz? ning?n seguro.  ?- Puede imprimir el cup?n correspondiente y llevarlo con su receta a la farmacia.  ?- Tambi?n puede pasar por nuestra oficina durante el horario de atenci?n regular y recoger una tarjeta de cupones de GoodRx.  ?- Si necesita que su receta se env?e electr?nicamente a Chiropodist, informe a nuestra oficina a trav?s de MyChart de [place] o por tel?fono llamando al [phone] y presione la opci?n 4.  ?

## 2021-08-15 NOTE — Progress Notes (Signed)
? ?  Follow-Up Visit ?  ?Subjective  ?Cory [name] is a 81 y.o. male who presents for the following: Actinic Keratosis (2 month follow up - Hypertrophic AK of scalp x 15 treated with LN2) and Other (Possible cyst of right calf per ER doctor). ? ?The following portions of the chart were reviewed this encounter and updated as appropriate:  ? Tobacco  Allergies  Meds  Problems  Med Hx  Surg Hx  Fam Hx   ?  ?Review of Systems:  No other skin or systemic complaints except as noted in HPI or Assessment and Plan. ? ?Objective  ?Well appearing patient in no apparent distress; mood and affect are within normal limits. ? ?A focused examination was performed including scalp, right leg. Relevant physical exam findings are noted in the Assessment and Plan. ? ?Scalp ?Hyperkeratosis ? ? ? ? ?Scalp (17) ?Erythematous thin papules/macules with gritty scale.  ? ?Scalp ?Erythematous stuck-on, waxy papule or plaque ? ? ?Assessment & Plan  ? ?Actinic Damage ?- chronic, secondary to cumulative UV radiation exposure/sun exposure over time ?- diffuse scaly erythematous macules with underlying dyspigmentation ?- Recommend daily broad spectrum sunscreen SPF 30+ to sun-exposed areas, reapply every 2 hours as needed.  ?- Recommend staying in the shade or wearing long sleeves, sun glasses (UVA+UVB protection) and wide brim hats (4-inch brim around the entire circumference of the hat). ?- Call for new or changing lesions. ? ?History of Squamous Cell Carcinoma of the Skin ?- No evidence of recurrence today ?- No lymphadenopathy ?- Recommend regular full body skin exams ?- Recommend daily broad spectrum sunscreen SPF 30+ to sun-exposed areas, reapply every 2 hours as needed.  ?- Call if any new or changing lesions are noted between office visits ? ?Hyperkeratosis at site of recent [facility] on scalp in setting of severely sun damaged skin and very atrophic skin. ?Scalp ?Recommend starting 40% Urea cream at bedtime and CLN cleanser daily ?May  consider mechanical debridement on follow up if not improved ? 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 ?Inflamed seborrheic keratosis ?Scalp ?Destruction of lesion - Scalp ?Complexity: simple   ?Destruction method: cryotherapy   ?Informed consent: discussed and consent obtained   ?Timeout:  patient name, date of birth, surgical site, and procedure verified ?Lesion destroyed using liquid nitrogen: Yes   ?Region frozen until ice ball extended beyond lesion: Yes   ?Outcome: patient tolerated procedure well with no complications   ?Post-procedure details: wound care instructions given   ? ?Lesion? ?Right Lower Leg - Posterior ?Patient was referred from ER for possible epidermoid cyst. Reviewed Ultrasound from ER today.  ?No evidence of cyst per exam today - could have been possible fluid collection seen on Ultrasound.  Now resolved. ?No evidence of cyst on skin exam and palpation today. ? ?Return for 3-4 weeks. ? ?I, Ashok Cordia, CMA, am acting as scribe for Sarina Ser, MD . ?Documentation: I have reviewed the above documentation for accuracy and completeness, and I agree with the above. ? ?Sarina Ser, MD ? ?

## 2021-08-17 ENCOUNTER — Other Ambulatory Visit (INDEPENDENT_AMBULATORY_CARE_PROVIDER_SITE_OTHER): Payer: Medicare Other

## 2021-08-17 ENCOUNTER — Ambulatory Visit (INDEPENDENT_AMBULATORY_CARE_PROVIDER_SITE_OTHER): Payer: Medicare Other

## 2021-08-17 DIAGNOSIS — E871 Hypo-osmolality and hyponatremia: Secondary | ICD-10-CM | POA: Diagnosis not present

## 2021-08-17 LAB — CORTISOL
Cortisol, Plasma: 13.2 ug/dL
Cortisol, Plasma: 21 ug/dL
Cortisol, Plasma: 26.3 ug/dL

## 2021-08-17 MED ORDER — COSYNTROPIN 0.25 MG IJ SOLR
0.2500 mg | Freq: Once | INTRAMUSCULAR | Status: AC
  Administered 2021-08-17: 0.25 mg via INTRAVENOUS

## 2021-08-17 NOTE — Progress Notes (Signed)
After obtaining consent, and per orders of Dr. Kelton Pillar, injection of Cosyntropin given by Bhavin Monjaraz L Graylin Sperling in left arm.   ?

## 2021-08-20 ENCOUNTER — Other Ambulatory Visit: Payer: Self-pay

## 2021-08-20 ENCOUNTER — Telehealth: Payer: Self-pay | Admitting: Internal Medicine

## 2021-08-20 MED ORDER — FUROSEMIDE 20 MG PO TABS: 20.0000 mg | ORAL_TABLET | Freq: Every day | ORAL | 1 refills | Status: DC

## 2021-08-20 MED ORDER — FUROSEMIDE 20 MG PO TABS: 20.0000 mg | ORAL_TABLET | Freq: Every day | ORAL | 6 refills | Status: DC

## 2021-08-20 NOTE — Telephone Encounter (Signed)
Please let the patient know that his cortisol test have come back normal and I would like for him to start a water pill to help get rid of the extra water in his body and hopefully that will improve his sodium and potassium levels... ? ? ?Medication ? ?Start furosemide 20 mg daily ? ? ?Thanks ? ?Abby Nena Jordan, MD ? ?[facility]  ?[facility] ?[address] ?[place], [place] [address] ?Phone: [phone] ?FAX: [phone] ? ?

## 2021-08-20 NOTE — Telephone Encounter (Signed)
Patient notified of results and verbalized understanding.  

## 2021-08-21 ENCOUNTER — Encounter: Payer: Self-pay | Admitting: Dermatology

## 2021-09-08 ENCOUNTER — Other Ambulatory Visit: Payer: Self-pay | Admitting: Family Medicine

## 2021-09-11 ENCOUNTER — Telehealth: Payer: Self-pay | Admitting: Family Medicine

## 2021-09-11 NOTE — Telephone Encounter (Signed)
Please look into the following billing issue: ? ?Step 1: Is the patient calling in regard to a [employer] or [employer]?  N ?If yes, please have patient reach out to [employer] or [employer] located on their bill received.  ? ?If patient was advised by [employer] or [employer] to reach out to the office, what did their billing department advise? N/A ? ?Step 2: verify that the patient has contacted billing at [phone] ? ?No, Ask the patient to contact billing ? ?Yes, what did billing tell the patient: ? ?Step 3: Is it  a bill for services performed in our office? Is it for a provider visit or lab visit? ? ?Patient Name: Cory [name] ?DOB:  08-13-1940 ?MRN:  [id number] ?Date of Service:  2.15.23 ?Amount: $295 ?Billing issue: Patient has received a bill that has gone to collections for DOS 2.15.23, Patient has Medicare A/B and primary and [employer] ? ? ?Patient was advised that this process can take up to a week or more. Patient also was advised that they may receive additional bills while this is being looked into and they are to hold onto all statements until resolved.   ? ?Please send this completed information to [employer] office supervisor or [place]  ?

## 2021-09-12 ENCOUNTER — Ambulatory Visit (INDEPENDENT_AMBULATORY_CARE_PROVIDER_SITE_OTHER): Payer: Medicare Other | Admitting: Dermatology

## 2021-09-12 DIAGNOSIS — L570 Actinic keratosis: Secondary | ICD-10-CM | POA: Diagnosis not present

## 2021-09-12 DIAGNOSIS — L578 Other skin changes due to chronic exposure to nonionizing radiation: Secondary | ICD-10-CM | POA: Diagnosis not present

## 2021-09-12 DIAGNOSIS — Z85828 Personal history of other malignant neoplasm of skin: Secondary | ICD-10-CM

## 2021-09-12 DIAGNOSIS — D692 Other nonthrombocytopenic purpura: Secondary | ICD-10-CM

## 2021-09-12 DIAGNOSIS — L821 Other seborrheic keratosis: Secondary | ICD-10-CM

## 2021-09-12 DIAGNOSIS — I6521 Occlusion and stenosis of right carotid artery: Secondary | ICD-10-CM

## 2021-09-12 DIAGNOSIS — L859 Epidermal thickening, unspecified: Secondary | ICD-10-CM

## 2021-09-12 NOTE — Progress Notes (Signed)
   Follow-Up Visit   Subjective  Cory [name] is a 81 y.o. male who presents for the following: Hyperkeratosis (Of the scalp at [facility] Mohs surgery site- improved with Urea cream but still some thickened areas at site) and Actinic Keratosis (Of the scalp - check for new or persistent precancerous skin lesions). The patient has spots, moles and lesions to be evaluated, some may be new or changing and the patient has concerns that these could be cancer.  The following portions of the chart were reviewed this encounter and updated as appropriate:   Tobacco  Allergies  Meds  Problems  Med Hx  Surg Hx  Fam Hx     Review of Systems:  No other skin or systemic complaints except as noted in HPI or Assessment and Plan.  Objective  Well appearing patient in no apparent distress; mood and affect are within normal limits.  A focused examination was performed including the face, arms, hands, and scalp. Relevant physical exam findings are noted in the Assessment and Plan.  Scalp, face, L ears x 17 (17) Erythematous thin papules/macules with gritty scale.   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) Scalp, face, L ears x 17  Destruction of lesion - Scalp, face, L ears x 17 Complexity: simple   Destruction method: cryotherapy   Informed consent: discussed and consent obtained   Timeout:  patient name, date of birth, surgical site, and procedure verified Lesion destroyed using liquid nitrogen: Yes   Region frozen until ice ball extended beyond lesion: Yes   Outcome: patient tolerated procedure well with no complications   Post-procedure details: wound care instructions given    Hyperkeratosis Scalp At [facility] Mohs surgery site - much improved with urea cream OTC May restart urea cream to aa's QD PRN.   Actinic Damage - chronic, secondary to cumulative UV radiation exposure/sun exposure over time - diffuse scaly erythematous macules with underlying dyspigmentation -  Recommend daily broad spectrum sunscreen SPF 30+ to sun-exposed areas, reapply every 2 hours as needed.  - Recommend staying in the shade or wearing long sleeves, sun glasses (UVA+UVB protection) and wide brim hats (4-inch brim around the entire circumference of the hat). - Call for new or changing lesions.  Seborrheic Keratoses - Stuck-on, waxy, tan-brown papules and/or plaques  - Benign-appearing - Discussed benign etiology and prognosis. - Observe - Call for any changes  Purpura - Chronic; persistent and recurrent.  Treatable, but not curable. - Violaceous macules and patches - Benign - Related to trauma, age, sun damage and/or use of blood thinners, chronic use of topical and/or oral steroids - Observe - Can use OTC arnica containing moisturizer such as Dermend Bruise Formula if desired - Call for worsening or other concerns  Return for AK follow up in 3-4 mths.  Luther Redo, CMA, am acting as scribe for Sarina Ser, MD . Documentation: I have reviewed the above documentation for accuracy and completeness, and I agree with the above.  Sarina Ser, MD

## 2021-09-12 NOTE — Patient Instructions (Signed)

## 2021-09-18 ENCOUNTER — Encounter: Payer: Self-pay | Admitting: Cardiovascular Disease

## 2021-09-18 ENCOUNTER — Ambulatory Visit (INDEPENDENT_AMBULATORY_CARE_PROVIDER_SITE_OTHER): Payer: Medicare Other | Admitting: Cardiovascular Disease

## 2021-09-18 VITALS — BP 148/68 | HR 72 | Ht 65.5 in | Wt 164.1 lb

## 2021-09-18 DIAGNOSIS — I739 Peripheral vascular disease, unspecified: Secondary | ICD-10-CM | POA: Diagnosis not present

## 2021-09-18 DIAGNOSIS — E785 Hyperlipidemia, unspecified: Secondary | ICD-10-CM

## 2021-09-18 DIAGNOSIS — I10 Essential (primary) hypertension: Secondary | ICD-10-CM | POA: Diagnosis not present

## 2021-09-18 DIAGNOSIS — I2510 Atherosclerotic heart disease of native coronary artery without angina pectoris: Secondary | ICD-10-CM

## 2021-09-18 DIAGNOSIS — I6521 Occlusion and stenosis of right carotid artery: Secondary | ICD-10-CM

## 2021-09-18 MED ORDER — CARVEDILOL 6.25 MG PO TABS: 6.2500 mg | ORAL_TABLET | Freq: Two times a day (BID) | ORAL | 1 refills | Status: DC

## 2021-09-18 MED ORDER — LISINOPRIL 20 MG PO TABS: 20.0000 mg | ORAL_TABLET | Freq: Every day | ORAL | 2 refills | Status: DC

## 2021-09-18 NOTE — Patient Instructions (Signed)
Medication Instructions:  Your physician has recommended you make the following change in your medication:   - STOP Clonidine - START Carvedilol 6.25 mg twice a day  - INCREASE Lisinopril to 20 mg daily  *If you need a refill on your cardiac medications before your next appointment, please call your pharmacy*   Lab Work: None ordered If you have labs (blood work) drawn today and your tests are completely normal, you will receive your results only by: [place] (if you have MyChart) OR A paper copy in the mail If you have any lab test that is abnormal or we need to change your treatment, we will call you to review the results.   Testing/Procedures: None ordered   Follow-Up: At [facility], you and your health needs are our priority.  As part of our continuing mission to provide you with exceptional heart care, we have created designated Provider Care Teams.  These Care Teams include your primary Cardiologist (physician) and Advanced Practice Providers (APPs -  Physician Assistants and Nurse Practitioners) who all work together to provide you with the care you need, when you need it.  We recommend signing up for the patient portal called "MyChart".  Sign up information is provided on this After Visit Summary.  MyChart is used to connect with patients for Virtual Visits (Telemedicine).  Patients are able to view lab/test results, encounter notes, upcoming appointments, etc.  Non-urgent messages can be sent to your provider as well.   To learn more about what you can do with MyChart, go to [email or web address].    Your next appointment:   6 month(s)  The format for your next appointment:   In Person  Provider:   You may see Kathlyn Sacramento, MD or one of the following Advanced Practice Providers on your designated Care Team:   Murray Hodgkins, NP Christell Faith, PA-C Cadence Kathlen Mody, [place]   Other Instructions N/A  Important Information About Sugar

## 2021-09-18 NOTE — Progress Notes (Unsigned)
Cardiology Office Note   Date:  09/18/2021   ID:  [name], Cory 07-11-1940, MRN [id number]  PCP:  [name], MD  Cardiologist:  Dr. Martinique but transferring to [facility] office.  Chief Complaint  Patient presents with   Other    6 Month f/u no complaints today. Meds reviewed verbally with pt.      History of Present Illness: Cory [name] is a 81 y.o. male who presents for a follow-up visit regarding peripheral arterial disease and coronary artery disease.   He has known history of coronary artery disease status post CABG, history of CVA, essential hypertension, hyperlipidemia, type 2 diabetes and history of vasovagal syncope.  His CABG was in 2000.  He had PCI of SVG to OM in 9390 complicated by aphasia and confusion but negative stroke work-up. He has history of thoracic aortic aneurysm measuring 4.6 cm The patient moved from [facility] to be closer to his family.  He was hospitalized at [facility] in January of this year with expressive aphasia.  CT was negative.  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  Carotid Doppler showed 60% right carotid stenosis.  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 He was seen for left calf claudication.  Lower extremity arterial Doppler done in July showed an ABI of 1.04 on the right and 0.71 on the left.  Duplex showed significant stenosis in the left popliteal artery.  Subtotal occlusion could not be ruled out.  He is more bothered by right leg pain and swelling.  He denies chest pain or worsening dyspnea.  He stopped taking furosemide due to incontinence.  He does wear knee-high support stockings.  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   Left scalp - EDC   Stroke ([place])    on Plavix and ASA   Vasovagal syncope     Past Surgical History:  Procedure Laterality Date   BACK SURGERY     CHOLECYSTECTOMY     CORONARY ARTERY BYPASS GRAFT  2000   DEBRIDEMENT AND CLOSURE WOUND N/A 01/19/2021   Procedure: Closure of scalp defect;  Surgeon: Cindra Presume, MD;  Location: [facility];  Service: Plastics;  Laterality: N/A;   MOHS SURGERY     NECK SURGERY       Current Outpatient Medications  Medication Sig Dispense Refill   acetaminophen (TYLENOL) 500 MG tablet Take 1,000 mg by mouth at bedtime.     amLODipine (NORVASC) 5 MG tablet TAKE 1 TABLET EVERY DAY 90 tablet 1   aspirin EC 81 MG tablet Take 1 tablet (81 mg total) by mouth daily. Swallow whole. 90 tablet 3   atorvastatin (LIPITOR) 40 MG tablet TAKE 1 TABLET EVERY DAY 90 tablet 3   cloNIDine (CATAPRES) 0.1 MG tablet Take 1 tablet (0.1 mg total) by mouth 2 (two) times daily. 180 tablet 3   clopidogrel (PLAVIX) 75 MG tablet TAKE 1 TABLET EVERY DAY 90 tablet 3   cycloSPORINE (RESTASIS) 0.05 % ophthalmic emulsion 1  drop 2 (two) times a day.     diclofenac Sodium (VOLTAREN) 1 % GEL Apply 2 g topically as needed.     escitalopram (LEXAPRO) 5 MG tablet Take 10 mg by mouth daily.     famotidine (PEPCID) 20 MG tablet Take 20 mg by mouth at bedtime.     fluticasone (FLONASE) 50 MCG/ACT nasal spray Place 1 spray into both nostrils daily. 16 g 3   latanoprost (XALATAN) 0.005 % ophthalmic solution daily.     lisinopril (ZESTRIL) 10 MG tablet TAKE 1 TABLET (10 MG TOTAL) BY MOUTH DAILY. 90 tablet 3   melatonin 5 MG TABS Take 5 mg by mouth at bedtime.     metFORMIN (GLUCOPHAGE) 500 MG tablet TAKE 1 TABLET TWICE DAILY WITH MEALS 180 tablet 1   montelukast (SINGULAIR) 10 MG tablet TAKE 1 TABLET (10 MG TOTAL) BY MOUTH AT BEDTIME. 90 tablet 3    nitroGLYCERIN (NITROSTAT) 0.4 MG SL tablet Place 1 tablet (0.4 mg total) under the tongue every 5 (five) minutes as needed. 5 tablet 0   pantoprazole (PROTONIX) 40 MG tablet Take 1 tablet (40 mg total) by mouth daily. (Patient taking differently: Take 40 mg by mouth 2 (two) times daily.) 90 tablet 3   No current facility-administered medications for this visit.    Allergies:   Fesoterodine, Isosorbide, Tramadol, Grass pollen([id number] vern), Hydrocodone, Meperidine, and Penicillins    Social History:  The patient  reports that he has quit smoking. His smoking use included pipe. He has never used smokeless tobacco. He reports that he does not drink alcohol and does not use drugs.   Family History:  The patient's family history includes Heart Problems in his father and mother; Heart attack in his father; Heart disease in his brother and mother.    ROS:  Please see the history of present illness.   Otherwise, review of systems are positive for none.   All other systems are reviewed and negative.    PHYSICAL EXAM: VS:  BP (!) 148/68 (BP Location: Left Arm, Patient Position: Sitting, Cuff Size: Normal)   Pulse 72   Ht 5' 5.5" (1.664 m)   Wt 164 lb 2 oz (74.4 kg)   SpO2 98%   BMI 26.90 kg/m  , BMI Body mass index is 26.9 kg/m. GEN: Well nourished, well developed, in no acute distress  HEENT: normal  Neck: no JVD, carotid bruits, or masses Cardiac: RRR; no rubs, or gallops,no edema .  2/6 systolic murmur in the aortic area Respiratory:  clear to auscultation bilaterally, normal work of breathing GI: soft, nontender, nondistended, + BS MS: no deformity or atrophy  Skin: warm and dry, no rash Neuro:  Strength and sensation are intact Psych: euthymic mood, full affect   EKG:  EKG is ordered today. The ekg ordered today demonstrates sinus rhythm with first-degree AV block and old inferior infarct.   Recent Labs: 05/01/2021: TSH 1.80 06/22/2021: ALT 22; B Natriuretic Peptide 112.1;  Hemoglobin 12.8; Platelets 197 08/06/2021: BUN 15; Creatinine, Ser 0.87; Potassium 5.2 No hemolysis seen; Sodium 130    Lipid Panel    Component Value Date/Time   CHOL 120 01/29/2021 1530   CHOL 137 03/13/2020 1123   TRIG 206.0 (H) 01/29/2021 1530   HDL 44.20 01/29/2021 1530   HDL 47 03/13/2020 1123   CHOLHDL 3 01/29/2021 1530   VLDL 41.2 (H) 01/29/2021 1530   LDLCALC 56 05/07/2020 0512   LDLCALC 65 03/13/2020 1123   LDLDIRECT 51.0 01/29/2021 1530  Wt Readings from Last 3 Encounters:  09/18/21 164 lb 2 oz (74.4 kg)  08/06/21 166 lb 12.8 oz (75.7 kg)  07/03/21 164 lb 9.6 oz (74.7 kg)          12/08/2020    9:55 AM  PAD Screen  Previous PAD dx? No  Previous surgical procedure? Yes  Dates of procedures Hx GABG/Cardiac cath  Pain with walking? Yes  Subsides with rest? Yes  Feet/toe relief with dangling? No  Painful, non-healing ulcers? No  Extremities discolored? No      ASSESSMENT AND PLAN:  1.  Peripheral arterial disease: The patient has mild left leg claudication.  His symptoms are not lifestyle limiting.  Recommend continuing medical therapy.  2.  Moderate calcified right carotid stenosis: I reviewed the results of recent carotid Doppler which showed stable moderate right carotid stenosis.  Recommend repeat carotid Doppler in November of this year.  3.  Coronary artery disease involving native coronary artery status post CABG: Currently with no anginal symptoms.  Previous PCI and SVG to OM.  Currently on dual antiplatelet therapy for both his coronary artery disease as well as recurrent strokes.    4.  Essential hypertension: Blood pressure is mildly elevated.  Clonidine is not a good option considering his age.  I elected to stop clonidine and start carvedilol 6.25 mg twice daily.  In addition, increase lisinopril to 20 mg daily.  Continue amlodipine.  5.  Hyperlipidemia: I reviewed most recent labs done in October which showed an LDL of 51.  This is at  target.  Continue atorvastatin 40 mg daily.    Disposition:   FU with me in 6 months  Signed,  Kathlyn Sacramento, MD  09/18/2021 3:06 PM    [facility]

## 2021-09-22 ENCOUNTER — Encounter: Payer: Self-pay | Admitting: Dermatology

## 2021-10-04 ENCOUNTER — Ambulatory Visit (INDEPENDENT_AMBULATORY_CARE_PROVIDER_SITE_OTHER): Payer: Medicare Other | Admitting: Podiatry

## 2021-10-04 ENCOUNTER — Encounter: Payer: Self-pay | Admitting: Podiatry

## 2021-10-04 DIAGNOSIS — M79675 Pain in left toe(s): Secondary | ICD-10-CM | POA: Diagnosis not present

## 2021-10-04 DIAGNOSIS — M79674 Pain in right toe(s): Secondary | ICD-10-CM | POA: Diagnosis not present

## 2021-10-04 DIAGNOSIS — B351 Tinea unguium: Secondary | ICD-10-CM

## 2021-10-04 DIAGNOSIS — E119 Type 2 diabetes mellitus without complications: Secondary | ICD-10-CM

## 2021-10-04 NOTE — Progress Notes (Signed)
This patient returns to my office for at risk foot care.  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  This patient is unable to cut nails himself since the patient cannot reach his nails.These nails are painful walking and wearing shoes.  This patient presents for at risk foot care today.  General Appearance  Alert, conversant and in no acute stress.  Vascular  Dorsalis pedis and posterior tibial  pulses are palpable  bilaterally.  Capillary return is within normal limits  bilaterally. Temperature is within normal limits  bilaterally.  Neurologic  Senn-Weinstein monofilament wire test within normal limits  bilaterally. Muscle power within normal limits bilaterally.  Nails Thick disfigured discolored nails with subungual debris  from hallux to fifth toes right foot. No evidence of bacterial infection or drainage bilaterally.  Orthopedic .  No crepitus or effusions noted.  No bony pathology or digital deformities noted. No rearfoot motion STJ and ankle  right foot.  Skin  normotropic skin with no porokeratosis noted bilaterally.  No signs of infections or ulcers noted.     Onychomycosis  Pain in right toes  Pain in left toes  Consent was obtained for treatment procedures.   Mechanical debridement of nails 1-5  bilaterally performed with a nail nipper.  Filed with dremel without incident.    Return office visit   3 months                   Told patient to return for periodic foot care and evaluation due to potential at risk complications.   Gardiner Barefoot DPM

## 2021-10-20 ENCOUNTER — Emergency Department (HOSPITAL_COMMUNITY): Payer: Medicare Other

## 2021-10-20 ENCOUNTER — Other Ambulatory Visit: Payer: Self-pay

## 2021-10-20 ENCOUNTER — Inpatient Hospital Stay (HOSPITAL_COMMUNITY)
Admission: EM | Admit: 2021-10-20 | Discharge: 2021-10-23 | DRG: 871 | Disposition: A | Discharge status: Home Health | Payer: Medicare Other | Attending: Internal Medicine | Admitting: Internal Medicine

## 2021-10-20 ENCOUNTER — Encounter (HOSPITAL_COMMUNITY): Payer: Self-pay | Admitting: Family Medicine

## 2021-10-20 DIAGNOSIS — Z79899 Other long term (current) drug therapy: Secondary | ICD-10-CM

## 2021-10-20 DIAGNOSIS — Z880 Allergy status to penicillin: Secondary | ICD-10-CM

## 2021-10-20 DIAGNOSIS — J9601 Acute respiratory failure with hypoxia: Secondary | ICD-10-CM | POA: Diagnosis present

## 2021-10-20 DIAGNOSIS — Z8249 Family history of ischemic heart disease and other diseases of the circulatory system: Secondary | ICD-10-CM | POA: Diagnosis not present

## 2021-10-20 DIAGNOSIS — I5033 Acute on chronic diastolic (congestive) heart failure: Secondary | ICD-10-CM | POA: Diagnosis not present

## 2021-10-20 DIAGNOSIS — R6520 Severe sepsis without septic shock: Secondary | ICD-10-CM | POA: Diagnosis present

## 2021-10-20 DIAGNOSIS — R7989 Other specified abnormal findings of blood chemistry: Secondary | ICD-10-CM

## 2021-10-20 DIAGNOSIS — A419 Sepsis, unspecified organism: Secondary | ICD-10-CM | POA: Diagnosis not present

## 2021-10-20 DIAGNOSIS — A4151 Sepsis due to Escherichia coli [E. coli]: Secondary | ICD-10-CM | POA: Diagnosis present

## 2021-10-20 DIAGNOSIS — R296 Repeated falls: Secondary | ICD-10-CM | POA: Diagnosis present

## 2021-10-20 DIAGNOSIS — R413 Other amnesia: Secondary | ICD-10-CM | POA: Diagnosis present

## 2021-10-20 DIAGNOSIS — D509 Iron deficiency anemia, unspecified: Secondary | ICD-10-CM | POA: Diagnosis present

## 2021-10-20 DIAGNOSIS — R338 Other retention of urine: Secondary | ICD-10-CM | POA: Diagnosis present

## 2021-10-20 DIAGNOSIS — E1165 Type 2 diabetes mellitus with hyperglycemia: Secondary | ICD-10-CM | POA: Diagnosis present

## 2021-10-20 DIAGNOSIS — Z20822 Contact with and (suspected) exposure to covid-19: Secondary | ICD-10-CM | POA: Diagnosis present

## 2021-10-20 DIAGNOSIS — Z7984 Long term (current) use of oral hypoglycemic drugs: Secondary | ICD-10-CM

## 2021-10-20 DIAGNOSIS — N401 Enlarged prostate with lower urinary tract symptoms: Secondary | ICD-10-CM | POA: Diagnosis present

## 2021-10-20 DIAGNOSIS — I10 Essential (primary) hypertension: Secondary | ICD-10-CM | POA: Diagnosis present

## 2021-10-20 DIAGNOSIS — Z885 Allergy status to narcotic agent status: Secondary | ICD-10-CM

## 2021-10-20 DIAGNOSIS — I69351 Hemiplegia and hemiparesis following cerebral infarction affecting right dominant side: Secondary | ICD-10-CM

## 2021-10-20 DIAGNOSIS — R778 Other specified abnormalities of plasma proteins: Secondary | ICD-10-CM

## 2021-10-20 DIAGNOSIS — Z87891 Personal history of nicotine dependence: Secondary | ICD-10-CM | POA: Diagnosis not present

## 2021-10-20 DIAGNOSIS — Z951 Presence of aortocoronary bypass graft: Secondary | ICD-10-CM | POA: Diagnosis not present

## 2021-10-20 DIAGNOSIS — N179 Acute kidney failure, unspecified: Secondary | ICD-10-CM | POA: Diagnosis present

## 2021-10-20 DIAGNOSIS — Z7902 Long term (current) use of antithrombotics/antiplatelets: Secondary | ICD-10-CM

## 2021-10-20 DIAGNOSIS — N138 Other obstructive and reflux uropathy: Secondary | ICD-10-CM | POA: Diagnosis present

## 2021-10-20 DIAGNOSIS — I248 Other forms of acute ischemic heart disease: Secondary | ICD-10-CM | POA: Diagnosis not present

## 2021-10-20 DIAGNOSIS — E860 Dehydration: Secondary | ICD-10-CM | POA: Diagnosis present

## 2021-10-20 DIAGNOSIS — I714 Abdominal aortic aneurysm, without rupture, unspecified: Secondary | ICD-10-CM | POA: Diagnosis present

## 2021-10-20 DIAGNOSIS — I498 Other specified cardiac arrhythmias: Secondary | ICD-10-CM

## 2021-10-20 DIAGNOSIS — E872 Acidosis, unspecified: Secondary | ICD-10-CM | POA: Diagnosis present

## 2021-10-20 DIAGNOSIS — I110 Hypertensive heart disease with heart failure: Secondary | ICD-10-CM | POA: Diagnosis present

## 2021-10-20 DIAGNOSIS — Z7982 Long term (current) use of aspirin: Secondary | ICD-10-CM

## 2021-10-20 DIAGNOSIS — Z888 Allergy status to other drugs, medicaments and biological substances status: Secondary | ICD-10-CM

## 2021-10-20 DIAGNOSIS — E871 Hypo-osmolality and hyponatremia: Secondary | ICD-10-CM | POA: Diagnosis present

## 2021-10-20 DIAGNOSIS — T503X5A Adverse effect of electrolytic, caloric and water-balance agents, initial encounter: Secondary | ICD-10-CM | POA: Diagnosis present

## 2021-10-20 DIAGNOSIS — I739 Peripheral vascular disease, unspecified: Secondary | ICD-10-CM

## 2021-10-20 DIAGNOSIS — E785 Hyperlipidemia, unspecified: Secondary | ICD-10-CM | POA: Diagnosis present

## 2021-10-20 DIAGNOSIS — E1151 Type 2 diabetes mellitus with diabetic peripheral angiopathy without gangrene: Secondary | ICD-10-CM | POA: Diagnosis present

## 2021-10-20 DIAGNOSIS — G3184 Mild cognitive impairment, so stated: Secondary | ICD-10-CM | POA: Diagnosis present

## 2021-10-20 DIAGNOSIS — I2510 Atherosclerotic heart disease of native coronary artery without angina pectoris: Secondary | ICD-10-CM | POA: Diagnosis present

## 2021-10-20 DIAGNOSIS — N10 Acute tubulo-interstitial nephritis: Secondary | ICD-10-CM | POA: Diagnosis present

## 2021-10-20 LAB — PROTIME-INR
INR: 1.3 — ABNORMAL HIGH (ref 0.8–1.2)
Prothrombin Time: 16.4 seconds — ABNORMAL HIGH (ref 11.4–15.2)

## 2021-10-20 LAB — URINALYSIS, ROUTINE W REFLEX MICROSCOPIC
Bilirubin Urine: NEGATIVE
Glucose, UA: NEGATIVE mg/dL
Ketones, ur: NEGATIVE mg/dL
Nitrite: NEGATIVE
Protein, ur: 100 mg/dL — AB
Specific Gravity, Urine: 1.013 (ref 1.005–1.030)
WBC, UA: >50 WBC/hpf — ABNORMAL HIGH (ref 0–5)
pH: 5 (ref 5.0–8.0)

## 2021-10-20 LAB — COMPREHENSIVE METABOLIC PANEL
ALT: 26 U/L (ref 0–44)
AST: 53 U/L — ABNORMAL HIGH (ref 15–41)
Albumin: 3.1 g/dL — ABNORMAL LOW (ref 3.5–5.0)
Alkaline Phosphatase: 64 U/L (ref 38–126)
Anion gap: 11 (ref 5–15)
BUN: 37 mg/dL — ABNORMAL HIGH (ref 8–23)
CO2: 20 mmol/L — ABNORMAL LOW (ref 22–32)
Calcium: 9.1 mg/dL (ref 8.9–10.3)
Chloride: 101 mmol/L (ref 98–111)
Creatinine, Ser: 2.3 mg/dL — ABNORMAL HIGH (ref 0.61–1.24)
GFR, Estimated: 28 mL/min — ABNORMAL LOW (ref >60)
Glucose, Bld: 130 mg/dL — ABNORMAL HIGH (ref 70–99)
Potassium: 4.2 mmol/L (ref 3.5–5.1)
Sodium: 132 mmol/L — ABNORMAL LOW (ref 135–145)
Total Bilirubin: 1.2 mg/dL (ref 0.3–1.2)
Total Protein: 6.3 g/dL — ABNORMAL LOW (ref 6.5–8.1)

## 2021-10-20 LAB — CBC WITH DIFFERENTIAL/PLATELET
Abs Immature Granulocytes: 0.34 10*3/uL — ABNORMAL HIGH (ref 0.00–0.07)
Basophils Absolute: 0 10*3/uL (ref 0.0–0.1)
Basophils Relative: 0 %
Eosinophils Absolute: 0 10*3/uL (ref 0.0–0.5)
Eosinophils Relative: 0 %
HCT: 34 % — ABNORMAL LOW (ref 39.0–52.0)
Hemoglobin: 11.4 g/dL — ABNORMAL LOW (ref 13.0–17.0)
Immature Granulocytes: 4 %
Lymphocytes Relative: 3 %
Lymphs Abs: 0.3 10*3/uL — ABNORMAL LOW (ref 0.7–4.0)
MCH: 29.7 pg (ref 26.0–34.0)
MCHC: 33.5 g/dL (ref 30.0–36.0)
MCV: 88.5 fL (ref 80.0–100.0)
Monocytes Absolute: 0.2 10*3/uL (ref 0.1–1.0)
Monocytes Relative: 3 %
Neutro Abs: 8.5 10*3/uL — ABNORMAL HIGH (ref 1.7–7.7)
Neutrophils Relative %: 90 %
Platelets: 144 10*3/uL — ABNORMAL LOW (ref 150–400)
RBC: 3.84 MIL/uL — ABNORMAL LOW (ref 4.22–5.81)
RDW: 14.2 % (ref 11.5–15.5)
WBC: 9.4 10*3/uL (ref 4.0–10.5)
nRBC: 0 % (ref 0.0–0.2)

## 2021-10-20 LAB — GLUCOSE, CAPILLARY: Glucose-Capillary: 102 mg/dL — ABNORMAL HIGH (ref 70–99)

## 2021-10-20 LAB — CK: Total CK: 591 U/L — ABNORMAL HIGH (ref 49–397)

## 2021-10-20 LAB — RESP PANEL BY RT-PCR (FLU A&B, COVID) ARPGX2
Influenza A by PCR: NEGATIVE
Influenza B by PCR: NEGATIVE
SARS Coronavirus 2 by RT PCR: NEGATIVE

## 2021-10-20 LAB — TROPONIN I (HIGH SENSITIVITY)
Troponin I (High Sensitivity): 99 ng/L — ABNORMAL HIGH (ref <18)
Troponin I (High Sensitivity): 179 ng/L (ref <18)

## 2021-10-20 LAB — LACTIC ACID, PLASMA
Lactic Acid, Venous: 2.5 mmol/L (ref 0.5–1.9)
Lactic Acid, Venous: 3 mmol/L (ref 0.5–1.9)
Lactic Acid, Venous: 2.3 mmol/L (ref 0.5–1.9)

## 2021-10-20 LAB — MAGNESIUM: Magnesium: 1.6 mg/dL — ABNORMAL LOW (ref 1.7–2.4)

## 2021-10-20 MED ORDER — VANCOMYCIN HCL IN DEXTROSE 1-5 GM/200ML-% IV SOLN: 1000.0000 mg | Freq: Once | INTRAVENOUS | Status: DC

## 2021-10-20 MED ORDER — MONTELUKAST SODIUM 10 MG PO TABS
10.0000 mg | ORAL_TABLET | Freq: Every day | ORAL | Status: DC
  Administered 2021-10-20 – 2021-10-22 (×3): 10 mg via ORAL
  Filled 2021-10-20 (×3): qty 1

## 2021-10-20 MED ORDER — ACETAMINOPHEN 325 MG PO TABS: 650.0000 mg | ORAL_TABLET | Freq: Four times a day (QID) | ORAL | Status: DC | PRN

## 2021-10-20 MED ORDER — LACTATED RINGERS IV BOLUS (SEPSIS)
1000.0000 mL | Freq: Once | INTRAVENOUS | Status: AC
Start: 2021-10-20 — End: 2021-10-20
  Administered 2021-10-20: 1000 mL via INTRAVENOUS

## 2021-10-20 MED ORDER — ESCITALOPRAM OXALATE 10 MG PO TABS
10.0000 mg | ORAL_TABLET | Freq: Every day | ORAL | Status: DC
  Administered 2021-10-20 – 2021-10-23 (×4): 10 mg via ORAL
  Filled 2021-10-20 (×4): qty 1

## 2021-10-20 MED ORDER — ATORVASTATIN CALCIUM 40 MG PO TABS
40.0000 mg | ORAL_TABLET | Freq: Every day | ORAL | Status: DC
  Administered 2021-10-21 – 2021-10-23 (×3): 40 mg via ORAL
  Filled 2021-10-20 (×3): qty 1

## 2021-10-20 MED ORDER — VANCOMYCIN VARIABLE DOSE PER UNSTABLE RENAL FUNCTION (PHARMACIST DOSING): Status: DC

## 2021-10-20 MED ORDER — ENOXAPARIN SODIUM 30 MG/0.3ML IJ SOSY
30.0000 mg | PREFILLED_SYRINGE | INTRAMUSCULAR | Status: DC
  Administered 2021-10-20 – 2021-10-22 (×3): 30 mg via SUBCUTANEOUS
  Filled 2021-10-20 (×3): qty 0.3

## 2021-10-20 MED ORDER — CLOPIDOGREL BISULFATE 75 MG PO TABS
75.0000 mg | ORAL_TABLET | Freq: Every day | ORAL | Status: DC
  Administered 2021-10-20 – 2021-10-23 (×4): 75 mg via ORAL
  Filled 2021-10-20 (×4): qty 1

## 2021-10-20 MED ORDER — LATANOPROST 0.005 % OP SOLN
1.0000 [drp] | Freq: Every day | OPHTHALMIC | Status: DC
  Administered 2021-10-20 – 2021-10-22 (×3): 1 [drp] via OPHTHALMIC
  Filled 2021-10-20: qty 2.5

## 2021-10-20 MED ORDER — VANCOMYCIN HCL 1500 MG/300ML IV SOLN
1500.0000 mg | Freq: Once | INTRAVENOUS | Status: AC
  Administered 2021-10-20: 1500 mg via INTRAVENOUS
  Filled 2021-10-20: qty 300

## 2021-10-20 MED ORDER — METRONIDAZOLE 500 MG/100ML IV SOLN
500.0000 mg | Freq: Once | INTRAVENOUS | Status: AC
  Administered 2021-10-20: 500 mg via INTRAVENOUS
  Filled 2021-10-20: qty 100

## 2021-10-20 MED ORDER — METRONIDAZOLE 500 MG/100ML IV SOLN
500.0000 mg | Freq: Two times a day (BID) | INTRAVENOUS | Status: DC
Start: 2021-10-20 — End: 2021-10-20

## 2021-10-20 MED ORDER — CYCLOSPORINE 0.05 % OP EMUL
1.0000 [drp] | Freq: Two times a day (BID) | OPHTHALMIC | Status: DC
  Administered 2021-10-20 – 2021-10-23 (×6): 1 [drp] via OPHTHALMIC
  Filled 2021-10-20 (×7): qty 30

## 2021-10-20 MED ORDER — METRONIDAZOLE 500 MG/100ML IV SOLN
500.0000 mg | Freq: Two times a day (BID) | INTRAVENOUS | Status: DC
Start: 2021-10-21 — End: 2021-10-21
  Administered 2021-10-21: 500 mg via INTRAVENOUS
  Filled 2021-10-20: qty 100

## 2021-10-20 MED ORDER — ACETAMINOPHEN 650 MG RE SUPP: 650.0000 mg | Freq: Four times a day (QID) | RECTAL | Status: DC | PRN

## 2021-10-20 MED ORDER — SODIUM CHLORIDE 0.9 % IV SOLN: 2.0000 g | INTRAVENOUS | Status: DC

## 2021-10-20 MED ORDER — INSULIN ASPART 100 UNIT/ML IJ SOLN
0.0000 [IU] | Freq: Three times a day (TID) | INTRAMUSCULAR | Status: DC
  Administered 2021-10-22: 2 [IU] via SUBCUTANEOUS
  Administered 2021-10-23: 1 [IU] via SUBCUTANEOUS

## 2021-10-20 MED ORDER — MAGNESIUM SULFATE 2 GM/50ML IV SOLN
2.0000 g | Freq: Once | INTRAVENOUS | Status: AC
  Administered 2021-10-20: 2 g via INTRAVENOUS
  Filled 2021-10-20: qty 50

## 2021-10-20 MED ORDER — ACETAMINOPHEN 325 MG PO TABS
650.0000 mg | ORAL_TABLET | Freq: Once | ORAL | Status: AC
  Administered 2021-10-20: 650 mg via ORAL
  Filled 2021-10-20: qty 2

## 2021-10-20 MED ORDER — SODIUM CHLORIDE 0.9 % IV SOLN
2.0000 g | Freq: Once | INTRAVENOUS | Status: AC
  Administered 2021-10-20: 2 g via INTRAVENOUS
  Filled 2021-10-20: qty 12.5

## 2021-10-20 MED ORDER — PANTOPRAZOLE SODIUM 40 MG PO TBEC
40.0000 mg | DELAYED_RELEASE_TABLET | Freq: Two times a day (BID) | ORAL | Status: DC
  Administered 2021-10-20 – 2021-10-23 (×6): 40 mg via ORAL
  Filled 2021-10-20 (×6): qty 1

## 2021-10-20 MED ORDER — MAGNESIUM SULFATE IN D5W 1-5 GM/100ML-% IV SOLN
1.0000 g | Freq: Once | INTRAVENOUS | Status: DC
  Filled 2021-10-20: qty 100

## 2021-10-20 MED ORDER — LACTATED RINGERS IV SOLN: INTRAVENOUS | Status: DC

## 2021-10-20 MED ORDER — LACTATED RINGERS IV BOLUS (SEPSIS)
250.0000 mL | Freq: Once | INTRAVENOUS | Status: AC
  Administered 2021-10-20: 250 mL via INTRAVENOUS

## 2021-10-20 MED ORDER — LACTATED RINGERS IV BOLUS (SEPSIS)
1000.0000 mL | Freq: Once | INTRAVENOUS | Status: AC
  Administered 2021-10-20: 1000 mL via INTRAVENOUS

## 2021-10-20 MED ORDER — ASPIRIN 81 MG PO TBEC
81.0000 mg | DELAYED_RELEASE_TABLET | Freq: Every day | ORAL | Status: DC
Start: 2021-10-20 — End: 2021-10-23
  Administered 2021-10-20 – 2021-10-23 (×4): 81 mg via ORAL
  Filled 2021-10-20 (×4): qty 1

## 2021-10-20 NOTE — ED Triage Notes (Addendum)
Pt via [facility] from home with report of hematuria x 2 days. On arrival EMS found that pt has had multiple falls over the past 3 days with worsening weakness, n/v. Bruising noted to right ribs and pt reports pain to right flank. Pt takes blood thinners, denies hitting head. A/O x 4 per EMS but seems confused on arrival.

## 2021-10-20 NOTE — Assessment & Plan Note (Addendum)
Baseline sodium: 128-131 and has had urine studies done that ruled out SIADH.  Low serum osmolality, urine sodium <30-->low effective arterial blood volume No hx of CHF, cirrhosis or nephrotic syndrome ? If PPI and lexapro contributing  Monitor with IVF hydration

## 2021-10-20 NOTE — Assessment & Plan Note (Addendum)
Blood pressures have trended down and are soft. Maintaining MAP Hold home norvasc, coreg and lisinopril with AKI

## 2021-10-20 NOTE — Assessment & Plan Note (Addendum)
S/p CABG in 2000 then PCI of SVG to OM in 2019 Currently on dual antiplatelet therapy for both his coronary artery disease as well as recurrent strokes.  Continue Asa, plavix, coreg, lipitor

## 2021-10-20 NOTE — Assessment & Plan Note (Addendum)
CT head with no acute finding and no neuro deficits Continue with DAPT and statin.

## 2021-10-20 NOTE — Assessment & Plan Note (Signed)
ekg with rate of 85 in setting of sepsis + fever to 105 and new finding of junctional rhythm.  On telemetry Repeating ekg now and tomorrow

## 2021-10-20 NOTE — ED Provider Notes (Addendum)
[facility] EMERGENCY DEPARTMENT Provider Note   CSN: [id number] Arrival date & time: 10/20/21  1352     History  Chief Complaint  Patient presents with   Fall   Weakness   Hematuria    Cory [name] is a 81 y.o. male.   Fall  Weakness Associated symptoms: arthralgias, dysuria and fever   Hematuria  Patient presents for multiple complaints.  He has had hematuria and worsening generalized weakness over the past 2 to 3 days.  Over this time, he has also had multiple falls.  Medical history includes BPH, arthritis, HTN, CAD, CVA with residual deficits, anemia, memory impairment, T2DM, carotid stenosis, and PAD.  He is on DAPT but not anticoagulation.  EMS reported that patient was alert and oriented on scene.  No interventions were provided prior to arrival.  He did have vomiting shortly prior to arrival.  Patient describes recent dysuria and gross hematuria over the past several days.  He also endorses bilateral hip pain and fatigue.     Home Medications Prior to Admission medications   Medication Sig Start Date End Date Taking? Authorizing Provider  acetaminophen (TYLENOL) 500 MG tablet Take 1,000 mg by mouth at bedtime.    [provider]  amLODipine (NORVASC) 5 MG tablet TAKE 1 TABLET EVERY DAY 09/10/21   Lynnda Child, MD  aspirin EC 81 MG tablet Take 1 tablet (81 mg total) by mouth daily. Swallow whole. 03/13/20   [place], Peter M, MD  atorvastatin (LIPITOR) 40 MG tablet TAKE 1 TABLET EVERY DAY 08/02/21   Lynnda Child, MD  carvedilol (COREG) 6.25 MG tablet Take 1 tablet (6.25 mg total) by mouth 2 (two) times daily. 09/18/21   Iran Ouch, MD  clopidogrel (PLAVIX) 75 MG tablet TAKE 1 TABLET EVERY DAY 02/01/21   Lynnda Child, MD  cycloSPORINE (RESTASIS) 0.05 % ophthalmic emulsion 1 drop 2 (two) times a day.    [provider]  diclofenac Sodium (VOLTAREN) 1 % GEL Apply 2 g topically as needed. 07/22/17   [provider]   escitalopram (LEXAPRO) 5 MG tablet Take 10 mg by mouth daily.    [provider]  famotidine (PEPCID) 20 MG tablet Take 20 mg by mouth at bedtime.    [provider]  fluticasone (FLONASE) 50 MCG/ACT nasal spray Place 1 spray into both nostrils daily. 03/20/21   Lynnda Child, MD  latanoprost (XALATAN) 0.005 % ophthalmic solution daily. 12/01/20   [provider]  lisinopril (ZESTRIL) 20 MG tablet Take 1 tablet (20 mg total) by mouth daily. 09/18/21   Iran Ouch, MD  melatonin 5 MG TABS Take 5 mg by mouth at bedtime.    [provider]  metFORMIN (GLUCOPHAGE) 500 MG tablet TAKE 1 TABLET TWICE DAILY WITH MEALS 04/17/21   Lynnda Child, MD  montelukast (SINGULAIR) 10 MG tablet TAKE 1 TABLET (10 MG TOTAL) BY MOUTH AT BEDTIME. 02/22/21   Lynnda Child, MD  nitroGLYCERIN (NITROSTAT) 0.4 MG SL tablet Place 1 tablet (0.4 mg total) under the tongue every 5 (five) minutes as needed. 10/10/20   Lynnda Child, MD  pantoprazole (PROTONIX) 40 MG tablet Take 1 tablet (40 mg total) by mouth daily. Patient taking differently: Take 40 mg by mouth 2 (two) times daily. 03/29/21   Lynnda Child, MD      Allergies    Fesoterodine, Isosorbide, Tramadol, Grass pollen([id number] vern), Hydrocodone, Meperidine, and Penicillins    Review  of Systems   Review of Systems  Constitutional:  Positive for activity change, appetite change, fatigue and fever.  Genitourinary:  Positive for dysuria and hematuria.  Musculoskeletal:  Positive for arthralgias.  Neurological:  Positive for weakness (Generalized).  Hematological:  Bruises/bleeds easily (On DAPT).  All other systems reviewed and are negative.   Physical Exam Updated Vital Signs BP 139/65   Pulse 99   Temp (!) 105 F (40.6 C) (Rectal)   Resp (!) 23   Ht 5' 5.5" (1.664 m)   Wt 74.8 kg   SpO2 94%   BMI 27.04 kg/m  Physical Exam Vitals and nursing note reviewed.  Constitutional:      General: He is in  acute distress.     Appearance: He is well-developed and normal weight. He is ill-appearing. He is not diaphoretic.  HENT:     Head: Normocephalic and atraumatic.     Right Ear: External ear normal.     Left Ear: External ear normal.     Nose: Nose normal.     Mouth/Throat:     Mouth: Mucous membranes are dry.  Eyes:     Extraocular Movements: Extraocular movements intact.     Conjunctiva/sclera: Conjunctivae normal.  Cardiovascular:     Rate and Rhythm: Normal rate and regular rhythm.     Heart sounds: No murmur heard. Pulmonary:     Effort: Pulmonary effort is normal. No respiratory distress.     Breath sounds: Normal breath sounds. No wheezing or rales.  Chest:     Chest wall: No tenderness.  Abdominal:     General: There is no distension.     Palpations: Abdomen is soft.     Tenderness: There is no abdominal tenderness.     Comments: Bruising to left side of abdomen  Musculoskeletal:        General: No swelling or deformity. Normal range of motion.     Cervical back: Normal range of motion and neck supple. No rigidity.     Right lower leg: No edema.     Left lower leg: No edema.  Skin:    General: Skin is warm and dry.     Coloration: Skin is pale. Skin is not jaundiced.  Neurological:     General: No focal deficit present.     Mental Status: He is alert and oriented to person, place, and time.     Cranial Nerves: No cranial nerve deficit.     Sensory: No sensory deficit.     Motor: No weakness.  Psychiatric:        Mood and Affect: Mood normal.        Behavior: Behavior normal.     ED Results / Procedures / Treatments   Labs (all labs ordered are listed, but only abnormal results are displayed) Labs Reviewed  COMPREHENSIVE METABOLIC PANEL - Abnormal; Notable for the following components:      Result Value   Sodium 132 (*)    CO2 20 (*)    Glucose, Bld 130 (*)    BUN 37 (*)    Creatinine, Ser 2.30 (*)    Total Protein 6.3 (*)    Albumin 3.1 (*)    AST 53  (*)    GFR, Estimated 28 (*)    All other components within normal limits  LACTIC ACID, PLASMA - Abnormal; Notable for the following components:   Lactic Acid, Venous 2.5 (*)    All other components within normal limits  LACTIC ACID,  PLASMA - Abnormal; Notable for the following components:   Lactic Acid, Venous 3.0 (*)    All other components within normal limits  CBC WITH DIFFERENTIAL/PLATELET - Abnormal; Notable for the following components:   RBC 3.84 (*)    Hemoglobin 11.4 (*)    HCT 34.0 (*)    Platelets 144 (*)    Neutro Abs 8.5 (*)    Lymphs Abs 0.3 (*)    Abs Immature Granulocytes 0.34 (*)    All other components within normal limits  PROTIME-INR - Abnormal; Notable for the following components:   Prothrombin Time 16.4 (*)    INR 1.3 (*)    All other components within normal limits  URINALYSIS, ROUTINE W REFLEX MICROSCOPIC - Abnormal; Notable for the following components:   APPearance HAZY (*)    Hgb urine dipstick MODERATE (*)    Protein, ur 100 (*)    Leukocytes,Ua LARGE (*)    WBC, UA >50 (*)    Bacteria, UA RARE (*)    All other components within normal limits  MAGNESIUM - Abnormal; Notable for the following components:   Magnesium 1.6 (*)    All other components within normal limits  TROPONIN I (HIGH SENSITIVITY) - Abnormal; Notable for the following components:   Troponin I (High Sensitivity) 99 (*)    All other components within normal limits  RESP PANEL BY RT-PCR (FLU A&B, COVID) ARPGX2  CULTURE, BLOOD (ROUTINE X 2)  CULTURE, BLOOD (ROUTINE X 2)  URINE CULTURE  LACTIC ACID, PLASMA  LACTIC ACID, PLASMA  CK  MAGNESIUM  TROPONIN I (HIGH SENSITIVITY)    EKG EKG Interpretation  Date/Time:  Saturday October 20 2021 13:54:48 EDT Ventricular Rate:  85 PR Interval:    QRS Duration: 103 QT Interval:  341 QTC Calculation: 406 R Axis:   101 Text Interpretation: Accelerated junctional rhythm Inferior infarct, age indeterminate Confirmed by Gloris Manchester [phone]) on  10/20/2021 2:13:55 PM  Radiology CT CHEST ABDOMEN PELVIS WO CONTRAST  Result Date: 10/20/2021 CLINICAL DATA:  Multiple falls over the past 3 days with worsening weakness. EXAM: CT CHEST, ABDOMEN AND PELVIS WITHOUT CONTRAST TECHNIQUE: Multidetector CT imaging of the chest, abdomen and pelvis was performed following the standard protocol without IV contrast. RADIATION DOSE REDUCTION: This exam was performed according to the departmental dose-optimization program which includes automated exposure control, adjustment of the mA and/or kV according to patient size and/or use of iterative reconstruction technique. COMPARISON:  None Available. FINDINGS: CT CHEST FINDINGS Cardiovascular: The heart is within normal limits in size for age. 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 Surgical changes from coronary artery bypass surgery. Extensive three-vessel coronary artery calcifications. Mediastinum/Nodes: Small scattered mediastinal and hilar lymph nodes but no mass, adenopathy the or hematoma. There is a moderate to large hiatal hernia. Lungs/Pleura: Small bilateral pleural effusions. Mild emphysematous changes and areas of pulmonary scarring. No infiltrates or pulmonary edema. No pulmonary contusion or pneumothorax. No worrisome pulmonary lesions. Musculoskeletal: Intact bony thorax. No sternal, rib or thoracic vertebral body fractures. CT ABDOMEN PELVIS FINDINGS Hepatobiliary: No acute hepatic injury or perihepatic fluid collection. The gallbladder is surgically absent. No common bile duct dilatation. Pancreas: No mass, inflammation or ductal dilatation. No evidence of acute pancreatic injury or peripancreatic fluid collection. Spleen: Normal size.  No acute injury or perihepatic hematoma. Adrenals/Urinary Tract: The adrenal glands and kidneys are unremarkable. No acute renal injury or perinephric fluid collection. The bladder is unremarkable. Stomach/Bowel: The  stomach,  duodenum, small bowel and colon are grossly normal without oral contrast. No inflammatory changes, mass lesions or obstructive findings. The appendix is normal. Diffuse colonic diverticulosis. Vascular/Lymphatic: Advanced atherosclerotic calcification involving the aorta and branch vessels but no aneurysm. No mesenteric or retroperitoneal mass, adenopathy or hematoma. Reproductive: The prostate gland and seminal vesicles are grossly normal. Other: No pelvic mass or adenopathy. No free pelvic fluid collections. No inguinal mass or adenopathy. No abdominal wall hernia or subcutaneous lesions. Musculoskeletal: No acute bony findings. The right hip prosthesis is normally located. No periprosthetic fracture. The pubic symphysis and SI joints are intact. No pelvic fractures. IMPRESSION: 1. No acute findings in the chest, abdomen or pelvis. 2. Small bilateral pleural effusions. 3. Moderate to large hiatal hernia. 4. Advanced atherosclerotic calcification involving the thoracic and abdominal aorta and branch vessels. 5. Status post cholecystectomy. 6. Diffuse colonic diverticulosis without findings for acute diverticulitis. 7. No significant bony findings. Aortic Atherosclerosis (ICD10-I70.0). Electronically Signed   By: Rudie Meyer M.D.   On: 10/20/2021 16:45   CT Head Wo Contrast  Result Date: 10/20/2021 CLINICAL DATA:  Head trauma. Multiple falls over the past 3 days with progressive weakness. EXAM: CT HEAD WITHOUT CONTRAST CT CERVICAL SPINE WITHOUT CONTRAST TECHNIQUE: Multidetector CT imaging of the head and cervical spine was performed following the standard protocol without intravenous contrast. Multiplanar CT image reconstructions of the cervical spine were also generated. RADIATION DOSE REDUCTION: This exam was performed according to the departmental dose-optimization program which includes automated exposure control, adjustment of the mA and/or kV according to patient size and/or use of iterative  reconstruction technique. COMPARISON:  04/04/2021 FINDINGS: CT HEAD FINDINGS Brain: No evidence of acute infarction, hemorrhage, hydrocephalus, extra-axial collection or mass lesion/mass effect. Remote right basal ganglia and left thalamic lacunar infarcts. There is mild diffuse low-attenuation within the subcortical and periventricular white matter compatible with chronic microvascular disease. Prominence of sulci and ventricles compatible with brain atrophy. Vascular: No hyperdense vessel or unexpected calcification. Skull: Normal. Negative for fracture or focal lesion. Sinuses/Orbits: No acute finding. Other: No signs of scalp laceration or hematoma CT CERVICAL SPINE FINDINGS Alignment: Normal. Skull base and vertebrae: No acute fracture. No primary bone lesion or focal pathologic process. Soft tissues and spinal canal: No prevertebral fluid or swelling. No visible canal hematoma. Disc levels: Marked degenerative disc disease identified at C5-6. Large ventral osteophytes are identified throughout the cervical spine. Upper chest: Negative. Other: None IMPRESSION: 1. No acute intracranial abnormality. 2. Chronic microvascular disease and brain atrophy. 3. No evidence for cervical spine fracture or subluxation. 4. Cervical degenerative disc disease as above. Electronically Signed   By: Signa Kell M.D.   On: 10/20/2021 16:43   CT Cervical Spine Wo Contrast  Result Date: 10/20/2021 CLINICAL DATA:  Head trauma. Multiple falls over the past 3 days with progressive weakness. EXAM: CT HEAD WITHOUT CONTRAST CT CERVICAL SPINE WITHOUT CONTRAST TECHNIQUE: Multidetector CT imaging of the head and cervical spine was performed following the standard protocol without intravenous contrast. Multiplanar CT image reconstructions of the cervical spine were also generated. RADIATION DOSE REDUCTION: This exam was performed according to the departmental dose-optimization program which includes automated exposure control,  adjustment of the mA and/or kV according to patient size and/or use of iterative reconstruction technique. COMPARISON:  04/04/2021 FINDINGS: CT HEAD FINDINGS Brain: No evidence of acute infarction, hemorrhage, hydrocephalus, extra-axial collection or mass lesion/mass effect. Remote right basal ganglia and left thalamic lacunar infarcts. There is mild diffuse low-attenuation within the subcortical and  periventricular white matter compatible with chronic microvascular disease. Prominence of sulci and ventricles compatible with brain atrophy. Vascular: No hyperdense vessel or unexpected calcification. Skull: Normal. Negative for fracture or focal lesion. Sinuses/Orbits: No acute finding. Other: No signs of scalp laceration or hematoma CT CERVICAL SPINE FINDINGS Alignment: Normal. Skull base and vertebrae: No acute fracture. No primary bone lesion or focal pathologic process. Soft tissues and spinal canal: No prevertebral fluid or swelling. No visible canal hematoma. Disc levels: Marked degenerative disc disease identified at C5-6. Large ventral osteophytes are identified throughout the cervical spine. Upper chest: Negative. Other: None IMPRESSION: 1. No acute intracranial abnormality. 2. Chronic microvascular disease and brain atrophy. 3. No evidence for cervical spine fracture or subluxation. 4. Cervical degenerative disc disease as above. Electronically Signed   By: Signa Kell M.D.   On: 10/20/2021 16:43   DG Chest Portable 1 View  Result Date: 10/20/2021 CLINICAL DATA:  Sepsis EXAM: PORTABLE CHEST 1 VIEW COMPARISON:  None Available. 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 Both lungs are clear. The visualized skeletal structures are unremarkable. IMPRESSION: Cardiomegaly without acute abnormality of the lungs in AP portable projection. Electronically Signed   By: Jearld Lesch M.D.   On: 10/20/2021 15:18    Procedures Procedures    Medications Ordered in ED Medications  lactated  ringers infusion (0 mLs Intravenous Stopped 10/20/21 1823)  vancomycin variable dose per unstable renal function (pharmacist dosing) (has no administration in time range)  ceFEPIme (MAXIPIME) 2 g in sodium chloride 0.9 % 100 mL IVPB (has no administration in time range)  magnesium sulfate IVPB 2 g 50 mL (2 g Intravenous New Bag/Given 10/20/21 1850)  lactated ringers bolus 1,000 mL (0 mLs Intravenous Stopped 10/20/21 1721)    And  lactated ringers bolus 1,000 mL (0 mLs Intravenous Stopped 10/20/21 1721)    And  lactated ringers bolus 250 mL (250 mLs Intravenous New Bag/Given 10/20/21 1823)  ceFEPIme (MAXIPIME) 2 g in sodium chloride 0.9 % 100 mL IVPB (0 g Intravenous Stopped 10/20/21 1608)  metroNIDAZOLE (FLAGYL) IVPB 500 mg (0 mg Intravenous Stopped 10/20/21 1609)  acetaminophen (TYLENOL) tablet 650 mg (650 mg Oral Given 10/20/21 1443)  vancomycin (VANCOREADY) IVPB 1500 mg/300 mL (0 mg Intravenous Stopped 10/20/21 1824)    ED Course/ Medical Decision Making/ A&P                           Medical Decision Making Amount and/or Complexity of Data Reviewed Labs: ordered. Radiology: ordered. ECG/medicine tests: ordered.  Risk OTC drugs. Prescription drug management. Decision regarding hospitalization.   This patient presents to the ED for concern of generalized weakness, dysuria, and hematuria, this involves an extensive number of treatment options, and is a complaint that carries with it a high risk of complications and morbidity.  The differential diagnosis includes urosepsis, pyelonephritis, prostatitis, neoplasm, dehydration, polypharmacy   Co morbidities that complicate the patient evaluation  BPH, arthritis, HTN, CAD, CVA with residual deficits, anemia, memory impairment, T2DM, carotid stenosis, and PAD   Additional history obtained:  Additional history obtained from N/A External records from outside source obtained and reviewed including EMR   Lab Tests:  I Ordered, and  personally interpreted labs.  The pertinent results include: AKI is present.  Patient has hypomagnesemia and mild hyponatremia with otherwise normal electrolytes.  Anemia is baseline.  Although he has no leukocytosis, he does have a neutrophilia.  Platelets are slightly decreased from baseline.  Lactic acid is elevated consistent with sepsis.  Troponin is elevated and demand ischemia is suspected.   Imaging Studies ordered:  I ordered imaging studies including CT imaging of head, cervical spine, chest, abdomen, and pelvis I independently visualized and interpreted imaging which showed small bilateral pleural effusions with no other acute findings I agree with the radiologist interpretation   Cardiac Monitoring: / EKG:  The patient was maintained on a cardiac monitor.  I personally viewed and interpreted the cardiac monitored which showed an underlying rhythm of: Initial EKG showed accelerated junctional rhythm.  Patient subsequently found to be in sinus rhythm.   Problem List / ED Course / Critical interventions / Medication management  Patient is 81 year old male presenting from home for worsening generalized weakness over the past 3 days.  This weakness has resulted in several falls.  Patient did have episode of vomiting prior to arrival.  On arrival, rectal temperature found to be 105 degrees.  Tylenol was given for antipyresis.  Septic work-up was initiated.  Interestingly, he was not tachycardic.  On initial EKG, tracing consistent with accelerated junctional rhythm.  Patient was treated with 30 cc/kg of IV fluids and broad-spectrum antibiotics.  He does endorse hematuria and dysuria over the past 3 days urinary source is suspected.  On exam, he is also noted to have dry oral mucosa consistent with dehydration.  Following IV fluids, on bladder ultrasound, patient had approximately 200 cc of urine.  Enlarged prostate was noted. Urinalysis sample was obtained.  Initial lab work showed lactic  acidosis and AKI consistent with severe sepsis.  Patient also developed mild hypoxia while in the ED with SPO2 of 88% on room air.  He was placed on 2 L home oxygen.  Although initially confused, patient had normal mentation following treatment of fever.  Initial troponin was elevated at 99.  This is likely from demand ischemia.  I do not feel that heparin is indicated.  Elevated troponin is a marker of poor prognosis.  Patient mains alert and oriented while in the ED.  He remained hemodynamically stable.  He did initially have accelerated junctional rhythm on EKG but this resolved to sinus rhythm.  Because of his multiple falls at home and evidence of bruising on his abdomen, patient underwent CT scans of head, cervical spine, chest, abdomen, and pelvis.  Results of these images showed some mild bilateral pleural effusions but were otherwise unremarkable.  Repeat lactic acid showed further increase.  Additional lactates were ordered.  Patient was admitted to hospitalist for further management. I ordered medication including IV fluids and broad-spectrum antibiotics for treatment of sepsis; Tylenol for antipyresis; magnesium sulfate for hypomagnesemia Reevaluation of the patient after these medicines showed that the patient improved I have reviewed the patients home medicines and have made adjustments as needed   Social Determinants of Health:  Lives at home, has access to outpatient care   CRITICAL CARE Performed by: Gloris Manchester   Total critical care time: 45 minutes  Critical care time was exclusive of separately billable procedures and treating other patients.  Critical care was necessary to treat or prevent imminent or life-threatening deterioration.  Critical care was time spent personally by me on the following activities: development of treatment plan with patient and/or surrogate as well as nursing, discussions with consultants, evaluation of patient's response to treatment, examination of  patient, obtaining history from patient or surrogate, ordering and performing treatments and interventions, ordering and review of laboratory studies, ordering and review of radiographic studies,  pulse oximetry and re-evaluation of patient's condition.       Final Clinical Impression(s) / ED Diagnoses Final diagnoses:  Sepsis with acute renal failure without septic shock, due to unspecified organism, unspecified acute renal failure type (HCC)  Acute respiratory failure with hypoxia (HCC)  Acute pyelonephritis    Rx / DC Orders ED Discharge Orders     None         Gloris Manchester, MD 10/20/21 1859    Gloris Manchester, MD 10/20/21 1904

## 2021-10-20 NOTE — Assessment & Plan Note (Signed)
In setting of sepsis.  No syncope that he reports PT/OT to eval

## 2021-10-20 NOTE — Sepsis Progress Note (Signed)
Discussion taken place via secure chat with current bedside RN for need of ordered lactic acid to be drawn

## 2021-10-20 NOTE — ED Notes (Signed)
Pt placed on 2 liters N/C due to sats of 88% on room air

## 2021-10-20 NOTE — Sepsis Progress Note (Signed)
Elink following code sepsis °

## 2021-10-21 ENCOUNTER — Inpatient Hospital Stay (HOSPITAL_COMMUNITY): Payer: Medicare Other

## 2021-10-21 DIAGNOSIS — A419 Sepsis, unspecified organism: Secondary | ICD-10-CM | POA: Diagnosis not present

## 2021-10-21 DIAGNOSIS — I248 Other forms of acute ischemic heart disease: Secondary | ICD-10-CM | POA: Diagnosis not present

## 2021-10-21 DIAGNOSIS — R6520 Severe sepsis without septic shock: Secondary | ICD-10-CM | POA: Diagnosis not present

## 2021-10-21 LAB — BLOOD CULTURE ID PANEL (REFLEXED) - BCID2

## 2021-10-21 LAB — BRAIN NATRIURETIC PEPTIDE: B Natriuretic Peptide: 717.5 pg/mL — ABNORMAL HIGH (ref 0.0–100.0)

## 2021-10-21 LAB — COMPREHENSIVE METABOLIC PANEL
ALT: 25 U/L (ref 0–44)
AST: 49 U/L — ABNORMAL HIGH (ref 15–41)
Albumin: 2.5 g/dL — ABNORMAL LOW (ref 3.5–5.0)
Alkaline Phosphatase: 48 U/L (ref 38–126)
Anion gap: 9 (ref 5–15)
BUN: 37 mg/dL — ABNORMAL HIGH (ref 8–23)
CO2: 22 mmol/L (ref 22–32)
Calcium: 8.5 mg/dL — ABNORMAL LOW (ref 8.9–10.3)
Chloride: 103 mmol/L (ref 98–111)
Creatinine, Ser: 2.14 mg/dL — ABNORMAL HIGH (ref 0.61–1.24)
GFR, Estimated: 30 mL/min — ABNORMAL LOW (ref >60)
Glucose, Bld: 106 mg/dL — ABNORMAL HIGH (ref 70–99)
Potassium: 4.3 mmol/L (ref 3.5–5.1)
Sodium: 134 mmol/L — ABNORMAL LOW (ref 135–145)
Total Bilirubin: 0.8 mg/dL (ref 0.3–1.2)
Total Protein: 5.3 g/dL — ABNORMAL LOW (ref 6.5–8.1)

## 2021-10-21 LAB — MAGNESIUM: Magnesium: 2.1 mg/dL (ref 1.7–2.4)

## 2021-10-21 LAB — GLUCOSE, CAPILLARY
Glucose-Capillary: 95 mg/dL (ref 70–99)
Glucose-Capillary: 104 mg/dL — ABNORMAL HIGH (ref 70–99)
Glucose-Capillary: 109 mg/dL — ABNORMAL HIGH (ref 70–99)
Glucose-Capillary: 108 mg/dL — ABNORMAL HIGH (ref 70–99)

## 2021-10-21 LAB — CBC
HCT: 32.3 % — ABNORMAL LOW (ref 39.0–52.0)
Hemoglobin: 10.1 g/dL — ABNORMAL LOW (ref 13.0–17.0)
MCH: 29.4 pg (ref 26.0–34.0)
MCHC: 31.3 g/dL (ref 30.0–36.0)
MCV: 94.2 fL (ref 80.0–100.0)
Platelets: 121 10*3/uL — ABNORMAL LOW (ref 150–400)
RBC: 3.43 MIL/uL — ABNORMAL LOW (ref 4.22–5.81)
RDW: 14.6 % (ref 11.5–15.5)
WBC: 13.6 10*3/uL — ABNORMAL HIGH (ref 4.0–10.5)
nRBC: 0 % (ref 0.0–0.2)

## 2021-10-21 LAB — ECHOCARDIOGRAM COMPLETE
AR max vel: 1.98 cm2
AV Area VTI: 2.47 cm2
AV Area mean vel: 2.21 cm2
AV Mean grad: 5 mmHg
AV Peak grad: 11.2 mmHg
Ao pk vel: 1.67 m/s
Area-P 1/2: 3.66 cm2
Calc EF: 44.9 %
Height: 65 in
MV VTI: 2.13 cm2
S' Lateral: 3.25 cm
Single Plane A2C EF: 41.1 %
Single Plane A4C EF: 47.2 %
Weight: 2582.03 oz

## 2021-10-21 LAB — LACTIC ACID, PLASMA: Lactic Acid, Venous: 1.7 mmol/L (ref 0.5–1.9)

## 2021-10-21 LAB — PROCALCITONIN: Procalcitonin: 32.85 ng/mL

## 2021-10-21 MED ORDER — TAMSULOSIN HCL 0.4 MG PO CAPS
0.4000 mg | ORAL_CAPSULE | Freq: Every day | ORAL | Status: DC
  Administered 2021-10-21 – 2021-10-23 (×3): 0.4 mg via ORAL
  Filled 2021-10-21 (×3): qty 1

## 2021-10-21 MED ORDER — SODIUM CHLORIDE 0.9 % IV SOLN
2.0000 g | INTRAVENOUS | Status: DC
  Administered 2021-10-21 – 2021-10-22 (×2): 2 g via INTRAVENOUS
  Filled 2021-10-21 (×2): qty 20

## 2021-10-21 MED ORDER — LACTATED RINGERS IV SOLN: INTRAVENOUS | Status: DC

## 2021-10-22 DIAGNOSIS — A419 Sepsis, unspecified organism: Secondary | ICD-10-CM | POA: Diagnosis not present

## 2021-10-22 DIAGNOSIS — R6520 Severe sepsis without septic shock: Secondary | ICD-10-CM | POA: Diagnosis not present

## 2021-10-22 LAB — COMPREHENSIVE METABOLIC PANEL
ALT: 25 U/L (ref 0–44)
AST: 41 U/L (ref 15–41)
Albumin: 2.4 g/dL — ABNORMAL LOW (ref 3.5–5.0)
Alkaline Phosphatase: 46 U/L (ref 38–126)
Anion gap: 8 (ref 5–15)
BUN: 41 mg/dL — ABNORMAL HIGH (ref 8–23)
CO2: 22 mmol/L (ref 22–32)
Calcium: 8.5 mg/dL — ABNORMAL LOW (ref 8.9–10.3)
Chloride: 104 mmol/L (ref 98–111)
Creatinine, Ser: 1.86 mg/dL — ABNORMAL HIGH (ref 0.61–1.24)
GFR, Estimated: 36 mL/min — ABNORMAL LOW (ref >60)
Glucose, Bld: 110 mg/dL — ABNORMAL HIGH (ref 70–99)
Potassium: 3.5 mmol/L (ref 3.5–5.1)
Sodium: 134 mmol/L — ABNORMAL LOW (ref 135–145)
Total Bilirubin: 0.5 mg/dL (ref 0.3–1.2)
Total Protein: 5.5 g/dL — ABNORMAL LOW (ref 6.5–8.1)

## 2021-10-22 LAB — CBC WITH DIFFERENTIAL/PLATELET
Abs Immature Granulocytes: 0.06 10*3/uL (ref 0.00–0.07)
Basophils Absolute: 0 10*3/uL (ref 0.0–0.1)
Basophils Relative: 0 %
Eosinophils Absolute: 0 10*3/uL (ref 0.0–0.5)
Eosinophils Relative: 0 %
HCT: 29.5 % — ABNORMAL LOW (ref 39.0–52.0)
Hemoglobin: 10.2 g/dL — ABNORMAL LOW (ref 13.0–17.0)
Immature Granulocytes: 1 %
Lymphocytes Relative: 4 %
Lymphs Abs: 0.4 10*3/uL — ABNORMAL LOW (ref 0.7–4.0)
MCH: 30.6 pg (ref 26.0–34.0)
MCHC: 34.6 g/dL (ref 30.0–36.0)
MCV: 88.6 fL (ref 80.0–100.0)
Monocytes Absolute: 0.6 10*3/uL (ref 0.1–1.0)
Monocytes Relative: 6 %
Neutro Abs: 8.9 10*3/uL — ABNORMAL HIGH (ref 1.7–7.7)
Neutrophils Relative %: 89 %
Platelets: 104 10*3/uL — ABNORMAL LOW (ref 150–400)
RBC: 3.33 MIL/uL — ABNORMAL LOW (ref 4.22–5.81)
RDW: 14.2 % (ref 11.5–15.5)
WBC: 9.9 10*3/uL (ref 4.0–10.5)
nRBC: 0 % (ref 0.0–0.2)

## 2021-10-22 LAB — BRAIN NATRIURETIC PEPTIDE: B Natriuretic Peptide: 806.4 pg/mL — ABNORMAL HIGH (ref 0.0–100.0)

## 2021-10-22 LAB — GLUCOSE, CAPILLARY
Glucose-Capillary: 97 mg/dL (ref 70–99)
Glucose-Capillary: 103 mg/dL — ABNORMAL HIGH (ref 70–99)
Glucose-Capillary: 162 mg/dL — ABNORMAL HIGH (ref 70–99)
Glucose-Capillary: 182 mg/dL — ABNORMAL HIGH (ref 70–99)

## 2021-10-22 LAB — URINE CULTURE: Culture: 1000 — AB

## 2021-10-22 LAB — PROCALCITONIN: Procalcitonin: 20.97 ng/mL

## 2021-10-22 LAB — MAGNESIUM: Magnesium: 2.2 mg/dL (ref 1.7–2.4)

## 2021-10-22 MED ORDER — POTASSIUM CHLORIDE CRYS ER 20 MEQ PO TBCR
40.0000 meq | EXTENDED_RELEASE_TABLET | Freq: Once | ORAL | Status: AC
  Administered 2021-10-22: 40 meq via ORAL
  Filled 2021-10-22: qty 2

## 2021-10-22 MED ORDER — FUROSEMIDE 20 MG PO TABS
20.0000 mg | ORAL_TABLET | Freq: Once | ORAL | Status: AC
  Administered 2021-10-22: 20 mg via ORAL
  Filled 2021-10-22: qty 1

## 2021-10-22 MED ORDER — SODIUM CHLORIDE 0.9 % IV SOLN
2.0000 g | INTRAVENOUS | Status: DC
  Administered 2021-10-23: 2 g via INTRAVENOUS
  Filled 2021-10-22: qty 20

## 2021-10-22 NOTE — Consult Note (Signed)
   [facility] Inpatient Consult   10/22/2021  MARKEE LIENEMANN III Feb 02, 1941 [id number]  [facility] [[facility]]  Accountable Care Organization [ACO] Patient: Medicare ACO REACH  Primary Care Provider:  Lynnda Child, MD, [place] [facility], is an embedded provider with a Chronic Care Management team and program, and is listed for the transition of care follow up and appointments.  Patient was screened for Embedded practice service needs for chronic care management. Met with the patient at the bedside.  Patient is sitting up in recliner. He endorses PCP.  Patient SDOH reviewed for transportation, food insecurity and medication.  Patient denies any current issues. Gave patient a reminder appointment follow up with PCP office and he endorses that he has the PCP and notes he has had is [facility].   Plan: No current needs assessed for post hospital care coordination at this time. Will continue to follow.  Please contact for further questions,  Charlesetta Shanks, RN BSN CCM [facility]  [phone] business mobile phone Toll free office [phone]  Fax number: [phone] [name]

## 2021-10-22 NOTE — Progress Notes (Signed)
Physical Therapy Treatment Patient Details Name: Cory [name] MRN: [id number] DOB: [birth year] Today's Date: 10/22/2021   History of Present Illness 81 y.o. male who presented to ED 10/20/21 with complaints of not feeling well for 4-5 days and complaints of hematuria, urgency and frequency and dysuria. He has also fallen a few times over the last few days which is out of the ordinary for him. +sepsis; AKI;  PMH significant of HTN, HLD, T2DM, CAD s/p CABG, PAD,  hx of CVA, thoracic AAA, depression and anxiety,    PT Comments    Pt sitting up in chair on entry, reports he thinks that he needs to go to bathroom. Pt stands and is immediately incontinent of stool and urine, however remained hooked to Pureflow. Had pt sit and requires multiple sit<>stand for pericare and clean gown placement. Pt is min guard to min A for transfers and min A for ambulation in room with RW. Pt requires constant cuing for safety with RW around obstacles in room. D/c plans remain appropriate. PT will continue to follow acutely.    Recommendations for follow up therapy are one component of a multi-disciplinary discharge planning process, led by the attending physician.  Recommendations may be updated based on patient status, additional functional criteria and insurance authorization.  Follow Up Recommendations  Home health PT (Home safety evaluation)     Assistance Recommended at Discharge Set up Supervision/Assistance  Patient can return home with the following A little help with bathing/dressing/bathroom;Assistance with cooking/housework   Equipment Recommendations  None recommended by PT       Precautions / Restrictions Precautions Precautions: Fall Restrictions Weight Bearing Restrictions: No     Mobility  Bed Mobility               General bed mobility comments: sitting up in recliner on entry    Transfers Overall transfer level: Needs assistance Equipment used: Rolling walker (2  wheels) Transfers: Sit to/from Stand Sit to Stand: Min assist, Min guard           General transfer comment: total of 4 x STS secondary to incontinence of stool with initial stand and multiple sit<>stand for cleaning, last STS required min A for power up due to fatigue    Ambulation/Gait Ambulation/Gait assistance: Min assist Gait Distance (Feet): 20 Feet Assistive device: Rolling walker (2 wheels) Gait Pattern/deviations: Step-through pattern, Decreased stride length, Trunk flexed Gait velocity: slowed Gait velocity interpretation: <1.31 ft/sec, indicative of household ambulator   General Gait Details: pt wants to stay in room due to incontinence, min A for steadying, constant cuing for proximity to RW and upright posture especially for navigation around obstacles in room         Balance Overall balance assessment: Needs assistance, History of Falls Sitting-balance support: No upper extremity supported, Feet supported Sitting balance-Leahy Scale: Fair     Standing balance support: Bilateral upper extremity supported, Reliant on assistive device for balance Standing balance-Leahy Scale: Poor                              Cognition Arousal/Alertness: Awake/alert Behavior During Therapy: Flat affect Overall Cognitive Status: No family/caregiver present to determine baseline cognitive functioning                                 General Comments: pt requires increased cuing for completion of tasks  General Comments General comments (skin integrity, edema, etc.): Pt on 2L O2 via [place] with SpO2 96% on RA remained 90-92%O2,      Pertinent Vitals/Pain Pain Assessment Pain Assessment: Faces Faces Pain Scale: Hurts a little bit Pain Location: R big toe Pain Descriptors / Indicators: Aching, Grimacing, Discomfort Pain Intervention(s): Monitored during session, Repositioned     PT Goals (current goals can now be found in the care plan  section) Acute Rehab PT Goals Patient Stated Goal: return home to wife and dog PT Goal Formulation: With patient Time For Goal Achievement: 11/04/21 Potential to Achieve Goals: Good Progress towards PT goals: Progressing toward goals    Frequency    Min 3X/week      PT Plan Current plan remains appropriate       AM-PAC PT "6 Clicks" Mobility   Outcome Measure  Help needed turning from your back to your side while in a flat bed without using bedrails?: A Little Help needed moving from lying on your back to sitting on the side of a flat bed without using bedrails?: A Lot Help needed moving to and from a bed to a chair (including a wheelchair)?: A Little Help needed standing up from a chair using your arms (e.g., wheelchair or bedside chair)?: A Little Help needed to walk in hospital room?: A Little Help needed climbing 3-5 steps with a railing? : A Lot 6 Click Score: 16    End of Session Equipment Utilized During Treatment: Gait belt Activity Tolerance: Patient limited by fatigue Patient left: in chair;with call bell/phone within reach Nurse Communication: Mobility status PT Visit Diagnosis: Unsteadiness on feet (R26.81);Repeated falls (R29.6);Muscle weakness (generalized) (M62.81)     Time: 3474-2595 PT Time Calculation (min) (ACUTE ONLY): 36 min  Charges:  $Gait Training: 8-22 mins $Therapeutic Activity: 8-22 mins                     Ormand Senn B. Beverely Risen PT, DPT Acute Rehabilitation Services Please use secure chat or  Call Office [phone]    Elon Alas [facility] 10/22/2021, 5:04 PM

## 2021-10-23 ENCOUNTER — Other Ambulatory Visit (HOSPITAL_COMMUNITY): Payer: Self-pay

## 2021-10-23 DIAGNOSIS — A419 Sepsis, unspecified organism: Secondary | ICD-10-CM | POA: Diagnosis not present

## 2021-10-23 DIAGNOSIS — R6520 Severe sepsis without septic shock: Secondary | ICD-10-CM | POA: Diagnosis not present

## 2021-10-23 LAB — COMPREHENSIVE METABOLIC PANEL
ALT: 26 U/L (ref 0–44)
AST: 38 U/L (ref 15–41)
Albumin: 2.5 g/dL — ABNORMAL LOW (ref 3.5–5.0)
Alkaline Phosphatase: 53 U/L (ref 38–126)
Anion gap: 10 (ref 5–15)
BUN: 38 mg/dL — ABNORMAL HIGH (ref 8–23)
CO2: 22 mmol/L (ref 22–32)
Calcium: 8.4 mg/dL — ABNORMAL LOW (ref 8.9–10.3)
Chloride: 101 mmol/L (ref 98–111)
Creatinine, Ser: 1.72 mg/dL — ABNORMAL HIGH (ref 0.61–1.24)
GFR, Estimated: 39 mL/min — ABNORMAL LOW (ref >60)
Glucose, Bld: 116 mg/dL — ABNORMAL HIGH (ref 70–99)
Potassium: 3.6 mmol/L (ref 3.5–5.1)
Sodium: 133 mmol/L — ABNORMAL LOW (ref 135–145)
Total Bilirubin: 0.7 mg/dL (ref 0.3–1.2)
Total Protein: 5.8 g/dL — ABNORMAL LOW (ref 6.5–8.1)

## 2021-10-23 LAB — PROCALCITONIN: Procalcitonin: 10.65 ng/mL

## 2021-10-23 LAB — CBC WITH DIFFERENTIAL/PLATELET
Abs Immature Granulocytes: 0.03 10*3/uL (ref 0.00–0.07)
Basophils Absolute: 0 10*3/uL (ref 0.0–0.1)
Basophils Relative: 0 %
Eosinophils Absolute: 0.1 10*3/uL (ref 0.0–0.5)
Eosinophils Relative: 1 %
HCT: 35.2 % — ABNORMAL LOW (ref 39.0–52.0)
Hemoglobin: 11.9 g/dL — ABNORMAL LOW (ref 13.0–17.0)
Immature Granulocytes: 1 %
Lymphocytes Relative: 10 %
Lymphs Abs: 0.7 10*3/uL (ref 0.7–4.0)
MCH: 29.9 pg (ref 26.0–34.0)
MCHC: 33.8 g/dL (ref 30.0–36.0)
MCV: 88.4 fL (ref 80.0–100.0)
Monocytes Absolute: 0.8 10*3/uL (ref 0.1–1.0)
Monocytes Relative: 13 %
Neutro Abs: 5 10*3/uL (ref 1.7–7.7)
Neutrophils Relative %: 75 %
Platelets: 114 10*3/uL — ABNORMAL LOW (ref 150–400)
RBC: 3.98 MIL/uL — ABNORMAL LOW (ref 4.22–5.81)
RDW: 14.1 % (ref 11.5–15.5)
WBC: 6.6 10*3/uL (ref 4.0–10.5)
nRBC: 0 % (ref 0.0–0.2)

## 2021-10-23 LAB — CULTURE, BLOOD (ROUTINE X 2): Special Requests: ADEQUATE

## 2021-10-23 LAB — GLUCOSE, CAPILLARY
Glucose-Capillary: 106 mg/dL — ABNORMAL HIGH (ref 70–99)
Glucose-Capillary: 123 mg/dL — ABNORMAL HIGH (ref 70–99)

## 2021-10-23 LAB — BRAIN NATRIURETIC PEPTIDE: B Natriuretic Peptide: 483.3 pg/mL — ABNORMAL HIGH (ref 0.0–100.0)

## 2021-10-23 LAB — MAGNESIUM: Magnesium: 2.1 mg/dL (ref 1.7–2.4)

## 2021-10-23 MED ORDER — TAMSULOSIN HCL 0.4 MG PO CAPS
0.4000 mg | ORAL_CAPSULE | Freq: Every day | ORAL | 0 refills | Status: DC
  Filled 2021-10-23: qty 30, 30d supply, fill #0

## 2021-10-23 MED ORDER — CEPHALEXIN 500 MG PO CAPS
500.0000 mg | ORAL_CAPSULE | Freq: Three times a day (TID) | ORAL | 0 refills | Status: AC
  Filled 2021-10-23: qty 21, 7d supply, fill #0

## 2021-10-23 MED ORDER — CARVEDILOL 6.25 MG PO TABS
6.2500 mg | ORAL_TABLET | Freq: Two times a day (BID) | ORAL | Status: DC
  Administered 2021-10-23: 6.25 mg via ORAL
  Filled 2021-10-23: qty 1

## 2021-10-23 MED ORDER — FUROSEMIDE 20 MG PO TABS
20.0000 mg | ORAL_TABLET | Freq: Once | ORAL | Status: AC
  Administered 2021-10-23: 20 mg via ORAL
  Filled 2021-10-23: qty 1

## 2021-10-23 MED ORDER — POTASSIUM CHLORIDE CRYS ER 20 MEQ PO TBCR
40.0000 meq | EXTENDED_RELEASE_TABLET | Freq: Once | ORAL | Status: AC
  Administered 2021-10-23: 40 meq via ORAL
  Filled 2021-10-23: qty 2

## 2021-10-23 NOTE — Discharge Summary (Signed)
Cory [name] ZOX:[id number] DOB: 10/09/1940 DOA: 10/20/2021  PCP: Lynnda Child, MD  Admit date: 10/20/2021  Discharge date: 10/23/2021  Admitted From: Home   Disposition:  Home   Recommendations for Outpatient Follow-up:   Follow up with PCP in 1-2 weeks  PCP Please obtain BMP/CBC, 2 view CXR in 1week,  (see Discharge instructions)   PCP Please follow up on the following pending results:    Home Health: PT   Equipment/Devices: Walker, 2 L nasal cannula oxygen as needed. Consultations: None  Discharge Condition: Stable    CODE STATUS: Full    Diet Recommendation: Heart Healthy Low Carb    Chief Complaint  Patient presents with   Fall   Weakness   Hematuria     Brief history of present illness from the day of admission and additional interim summary    81 y.o. male with medical history significant of HTN, HLD, T2DM, CAD s/p CABG, PAD,  hx of CVA, thoracic AAA, depression and anxiety, who presented to ED with complaints of not feeling well for 4-5 days and complaints of hematuria, urgency and frequency and dysuria. Also has suprapubic tenderness.  He also has new CVA tenderness on the left side.  He was diagnosed with sepsis due to UTI/left-sided pyelonephritis and admitted to the hospital                                                                 Hospital Course    Severe sepsis E. coli bacteremia due to UTI/with possible left-sided pyelonephritis.  Patient has underlying history of BPH, likely causing urinary retention or at least incomplete bladder emptying, on empiric IV Rocephin, sepsis pathophysiology has resolved, he is at baseline and he appears nontoxic.  Has received IV Rocephin 2 g dose on his hospital days will be transition to 7 more days of oral Keflex, Flomax added.  Currently close  to baseline and symptom-free will be discharged with outpatient follow-up with PCP and his primary urologist in [place] within a week of discharge.   AKI (acute kidney injury) due to sepsis with some dehydration and hyponatremia- due to #1 above, ACE inhibitor held, has been adequately hydrated, renal function has serially improved, CT abdomen pelvis does not show any signs of urinary retention, good urine output, will be discharged home we will continue to hold lisinopril upon discharge as renal function still not at baseline.  Follow-up with PCP within a week PCP to recheck CBC, BMP next visit and readjust blood pressure medications as appropriate.       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  Currently not a candidate for invasive testing or procedures due to sepsis and bacteremia.  Blood pressure has stabilized he will continue his low-dose beta-blocker at home along with DAPT and statin.  Gentle diuresis with oral Lasix, renal function improving on low-dose Lasix.  1 more dose today, at rest he is on room air upon ambulation he does require some oxygen intermittently and will give 2 L of home oxygen to be used on a as needed basis.  Post discharge PCP to monitor   Junctional rhythm - ekg with rate of 85 in setting of sepsis + fever to 105 and new finding of junctional rhythm.  Stable on repeat EKG on 10/21/2021.  Stable on telemetry.  Outpatient follow-up with primary cardiologist postdischarge.   Frequent falls - In setting of sepsis. No syncope that he reports, no loss of consciousness, no focal deficits, head and C-spine CT stable.  We will  get home PT upon discharge.   CAD (coronary artery disease), native coronary artery - S/p CABG in 2000 then PCI of SVG to OM in 2019, Currently on dual antiplatelet therapy for both his coronary artery disease as well as recurrent strokes. 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    Hypertension - Blood pressures has improved, low-dose Coreg and monitor.  Holding Norvasc and ACE inhibitor.     Iron deficiency anemia - Stable at baseline   PAD/carotid stenosis  - Lower extremity arterial Doppler done in July showed an ABI of 1.04 on the right and 0.71 on the left.  Duplex showed significant stenosis in the left popliteal artery.-follows with cardiology, medical management. Moderate right carotid artery stenosis, routine f/u. Due for carotid US in 02/2022, Continue DAPT/statin    Type 2 diabetes mellitus with hyperglycemia, without long-term current use of insulin (HCC) - A1c in 04/2021 was 6.5, continue home regimen upon discharge.   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  Get  CBC, CMP,  -  checked next visit within 1 week by Primary MD    Activity: As tolerated with Full fall precautions use walker/cane & assistance as needed  Disposition Home    Diet: Heart Healthy Low Carb  Special Instructions: If you have smoked or chewed Tobacco  in the last 2 yrs please stop smoking, stop any regular Alcohol  and  or any Recreational drug use.  On your next visit with your primary care physician please Get Medicines reviewed and adjusted.  Please request your Prim.MD to go over all Hospital Tests and Procedure/Radiological results at the follow up, please get all Hospital records sent to your Prim MD by signing hospital release before you go home.  If you experience worsening of your admission symptoms, develop shortness of breath, life threatening emergency, suicidal or homicidal thoughts you must seek medical attention immediately by calling 911 or calling your MD immediately  if symptoms less severe.  You Must read complete instructions/literature along with all the possible adverse reactions/side effects for all the Medicines you take and that have been prescribed to you. Take any new Medicines after you have completely understood and accpet all the possible adverse reactions/side effects.   Increase activity slowly   Complete by: As directed        Discharge Medications   Allergies as of 10/23/2021       Reactions   Fesoterodine Other (See Comments)   Dry mouth and constipation   Isosorbide Other (See Comments)   Tramadol Other (See Comments)   Grass Pollen([id number]-swt Vern) Other (See Comments)   Hydrocodone Anxiety   Meperidine Other (See Comments), Rash   Other reaction(s): Other (See Comments) HALLUCINATIONS   Penicillins Rash        Medication List     STOP taking these medications    lisinopril 20 MG tablet Commonly known as: ZESTRIL       TAKE these medications    acetaminophen 500 MG tablet Commonly known as: TYLENOL Take 1,000 mg  by mouth at bedtime.   amLODipine 5 MG tablet Commonly known as: NORVASC TAKE 1 TABLET EVERY DAY   aspirin EC 81 MG tablet Take 1 tablet (81 mg total) by mouth daily. Swallow whole.   atorvastatin 40 MG tablet Commonly known as: LIPITOR TAKE 1 TABLET EVERY DAY   carvedilol 6.25 MG tablet Commonly known as: COREG Take 1 tablet (6.25 mg total) by mouth 2 (two) times daily.   cephALEXin 500 MG capsule Commonly known as: KEFLEX Take 1 capsule (500 mg total) by mouth 3 (three) times daily for 7 days.   clopidogrel 75 MG tablet Commonly known as: PLAVIX TAKE 1 TABLET EVERY DAY   cycloSPORINE 0.05 % ophthalmic emulsion Commonly known as: RESTASIS 1 drop 2 (two) times a day.   diclofenac Sodium 1 % Gel Commonly known as: VOLTAREN Apply 2 g topically as needed.   escitalopram 5 MG tablet Commonly known as: LEXAPRO Take 10 mg by mouth daily.   famotidine 20 MG tablet Commonly known as: PEPCID Take 20 mg by mouth at bedtime.   fluticasone 50 MCG/ACT nasal spray Commonly known as: FLONASE Place 1 spray into both nostrils daily.   latanoprost 0.005 % ophthalmic solution Commonly known as: XALATAN daily.   melatonin 5 MG Tabs Take 5 mg by mouth at bedtime.   metFORMIN 500 MG tablet Commonly known as: GLUCOPHAGE TAKE 1 TABLET TWICE DAILY WITH MEALS   montelukast 10 MG tablet Commonly known as: SINGULAIR TAKE 1 TABLET (10 MG TOTAL) BY MOUTH AT BEDTIME.   nitroGLYCERIN 0.4 MG SL tablet Commonly known as: NITROSTAT Place 1 tablet (0.4 mg total) under the tongue every 5 (five) minutes as needed.   pantoprazole 40 MG tablet Commonly known as: PROTONIX Take 1 tablet (40 mg total) by mouth daily. What changed: when to take this   tamsulosin 0.4 MG Caps capsule  Commonly known as: FLOMAX Take 1 capsule (0.4 mg total) by mouth daily. Start taking on: October 24, 2021               Durable Medical Equipment  (From admission, onward)           Start      Ordered   10/23/21 0929  For home use only DME oxygen  Once       Question Answer Comment  Length of Need 6 Months   Mode or (Route) Nasal cannula   Liters per Minute 2   Frequency Continuous (stationary and portable oxygen unit needed)   Oxygen conserving device Yes   Oxygen delivery system Gas      10/23/21 0928   10/23/21 0916  For home use only DME Walker rolling  Once       Comments: 5 wheel  Question Answer Comment  Walker: With 5 Inch Wheels   Patient needs a walker to treat with the following condition Weakness      10/23/21 0915             Follow-up Information     [facility] Follow up.   Specialty: Home Health Services Why: Someone from [facility] will contact you to arrange start for your Home Health therapy. Contact information: [address] [place] [address] [phone]         Lynnda Child, MD. Schedule an appointment as soon as possible for a visit in 1 week(s).   Specialty: Family Medicine Why: Follow-up with your urologist within a week of discharge Contact information: [address] [place] [address] [phone]         [place], Peter M, MD .   Specialty: Cardiology Contact information: [address] [place] [address] [phone]                 Major procedures and Radiology Reports - PLEASE review detailed and final reports thoroughly  -       ECHOCARDIOGRAM COMPLETE  Result Date: 10/21/2021    ECHOCARDIOGRAM REPORT   Patient Name:   TAVION SEELIGER [name] Date of Exam: 10/21/2021 Medical Rec #:  [id number]          Height:       65.0 in Accession #:    [id number]         Weight:       161.4 lb Date of Birth:  1940/11/09         BSA:          1.806 m Patient Age:    80 years           BP:           132/65 mmHg Patient Gender: M                  HR:           79 bpm. Exam Location:  Inpatient Procedure: 2D Echo, Cardiac Doppler and Color Doppler Indications:     Acute ischemic heart disease  History:        Patient has prior history of Echocardiogram examinations, most                 recent 05/07/2020. CAD, Stroke, Signs/Symptoms:Murmur; Risk                 Factors:Hypertension and Diabetes.  Sonographer:    Mikki Harbor Referring  Phys: [id number] [facility] IMPRESSIONS  1. Left ventricular ejection fraction, by estimation, is 55 to 60%. The left ventricle has normal function. The left ventricle has no regional wall motion abnormalities. There is mild concentric left ventricular hypertrophy. Left ventricular diastolic parameters are consistent with Grade I diastolic dysfunction (impaired relaxation).  2. Right ventricular systolic function is normal. The right ventricular size is normal. There is mildly elevated pulmonary artery systolic pressure. The estimated right ventricular systolic pressure is 41.4 mmHg.  3. Left atrial size was moderately dilated.  4. The mitral valve is grossly normal. Mild mitral valve regurgitation.  5. The aortic valve is tricuspid. There is moderate calcification of the aortic valve. There is moderate thickening of the aortic valve. Aortic valve regurgitation is trivial. Aortic valve sclerosis/calcification is present, without any evidence of aortic stenosis.  6. Aortic dilatation noted. There is mild dilatation of the ascending aorta, measuring 39 mm.  7. The inferior vena cava is dilated in size with >50% respiratory variability, suggesting right atrial pressure of 8 mmHg. Comparison(s): No significant change from prior TTE on 04/2020. FINDINGS  Left Ventricle: Left ventricular ejection fraction, by estimation, is 55 to 60%. The left ventricle has normal function. The left ventricle has no regional wall motion abnormalities. The left ventricular internal cavity size was normal in size. There is  mild concentric left ventricular hypertrophy. Left ventricular diastolic parameters are consistent with Grade I diastolic dysfunction (impaired  relaxation). Right Ventricle: The right ventricular size is normal. No increase in right ventricular wall thickness. Right ventricular systolic function is normal. There is mildly elevated pulmonary artery systolic pressure. The tricuspid regurgitant velocity is 2.89  m/s, and with an assumed right atrial pressure of 8 mmHg, the estimated right ventricular systolic pressure is 41.4 mmHg. Left Atrium: Left atrial size was moderately dilated. Right Atrium: Right atrial size was normal in size. Pericardium: There is no evidence of pericardial effusion. Mitral Valve: The mitral valve is grossly normal. There is mild thickening of the mitral valve leaflet(s). There is mild calcification of the mitral valve leaflet(s). Mild to moderate mitral annular calcification. Mild mitral valve regurgitation. MV peak  gradient, 6.9 mmHg. The mean mitral valve gradient is 3.0 mmHg. Tricuspid Valve: The tricuspid valve is normal in structure. Tricuspid valve regurgitation is mild. Aortic Valve: The aortic valve is tricuspid. There is moderate calcification of the aortic valve. There is moderate thickening of the aortic valve. Aortic valve regurgitation is trivial. Aortic valve sclerosis/calcification is present, without any evidence of aortic stenosis. Aortic valve mean gradient measures 5.0 mmHg. Aortic valve peak gradient measures 11.2 mmHg. Aortic valve area, by VTI measures 2.47 cm. Pulmonic Valve: The pulmonic valve was normal in structure. Pulmonic valve regurgitation is trivial. Aorta: Aortic dilatation noted. There is mild dilatation of the ascending aorta, measuring 39 mm. Venous: The inferior vena cava is dilated in size with greater than 50% respiratory variability, suggesting right atrial pressure of 8 mmHg. IAS/Shunts: The atrial septum is grossly normal.  LEFT VENTRICLE PLAX 2D LVIDd:         4.40 cm     Diastology LVIDs:         3.25 cm     LV e' medial:    5.55 cm/s LV PW:         1.40 cm     LV E/e' medial:  20.5 LV  IVS:        1.20 cm     LV e' lateral:  9.25 cm/s LVOT diam:     2.10 cm     LV E/e' lateral: 12.3 LV SV:         81 LV SV Index:   45 LVOT Area:     3.46 cm  LV Volumes (MOD) LV vol d, MOD A2C: 65.9 ml LV vol d, MOD A4C: 79.2 ml LV vol s, MOD A2C: 38.8 ml LV vol s, MOD A4C: 41.8 ml LV SV MOD A2C:     27.1 ml LV SV MOD A4C:     79.2 ml LV SV MOD BP:      32.6 ml RIGHT VENTRICLE RV Basal diam:  3.10 cm RV Mid diam:    3.60 cm RV S prime:     10.40 cm/s TAPSE (M-mode): 1.7 cm LEFT ATRIUM             Index        RIGHT ATRIUM           Index LA diam:        3.80 cm 2.10 cm/m   RA Area:     18.10 cm LA Vol (A2C):   58.6 ml 32.45 ml/m  RA Volume:   45.40 ml  25.14 ml/m LA Vol (A4C):   76.5 ml 42.37 ml/m LA Biplane Vol: 68.3 ml 37.82 ml/m  AORTIC VALVE                     PULMONIC VALVE AV Area (Vmax):    1.98 cm      PV Vmax:       0.95 m/s AV Area (Vmean):   2.21 cm      PV Peak grad:  3.6 mmHg AV Area (VTI):     2.47 cm AV Vmax:           167.00 cm/s AV Vmean:          105.000 cm/s AV VTI:            0.328 m AV Peak Grad:      11.2 mmHg AV Mean Grad:      5.0 mmHg LVOT Vmax:         95.30 cm/s LVOT Vmean:        66.900 cm/s LVOT VTI:          0.234 m LVOT/AV VTI ratio: 0.71  AORTA Ao Root diam: 3.50 cm Ao Asc diam:  3.90 cm MITRAL VALVE                TRICUSPID VALVE MV Area (PHT): 3.66 cm     TR Peak grad:   33.4 mmHg MV Area VTI:   2.13 cm     TR Vmax:        289.00 cm/s MV Peak grad:  6.9 mmHg MV Mean grad:  3.0 mmHg     SHUNTS MV Vmax:       1.31 m/s     Systemic VTI:  0.23 m MV Vmean:      72.8 cm/s    Systemic Diam: 2.10 cm MV Decel Time: 207 msec MV E velocity: 114.00 cm/s MV A velocity: 116.00 cm/s MV E/A ratio:  0.98 Laurance Flatten MD Electronically signed by Laurance Flatten MD Signature Date/Time: 10/21/2021/11:56:39 AM    Final    CT CHEST ABDOMEN PELVIS WO CONTRAST  Result Date: 10/20/2021 CLINICAL DATA:  Multiple falls over the past 3 days with worsening weakness. EXAM: CT CHEST,  ABDOMEN AND PELVIS WITHOUT CONTRAST TECHNIQUE:  Multidetector CT imaging of the chest, abdomen and pelvis was performed following the standard protocol without IV contrast. RADIATION DOSE REDUCTION: This exam was performed according to the departmental dose-optimization program which includes automated exposure control, adjustment of the mA and/or kV according to patient size and/or use of iterative reconstruction technique. COMPARISON:  None Available. FINDINGS: CT CHEST FINDINGS Cardiovascular: The heart is within normal limits in size for age. 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 Surgical changes from coronary artery bypass surgery. Extensive three-vessel coronary artery calcifications. Mediastinum/Nodes: Small scattered mediastinal and hilar lymph nodes but no mass, adenopathy the or hematoma. There is a moderate to large hiatal hernia. Lungs/Pleura: Small bilateral pleural effusions. Mild emphysematous changes and areas of pulmonary scarring. No infiltrates or pulmonary edema. No pulmonary contusion or pneumothorax. No worrisome pulmonary lesions. Musculoskeletal: Intact bony thorax. No sternal, rib or thoracic vertebral body fractures. CT ABDOMEN PELVIS FINDINGS Hepatobiliary: No acute hepatic injury or perihepatic fluid collection. The gallbladder is surgically absent. No common bile duct dilatation. Pancreas: No mass, inflammation or ductal dilatation. No evidence of acute pancreatic injury or peripancreatic fluid collection. Spleen: Normal size.  No acute injury or perihepatic hematoma. Adrenals/Urinary Tract: The adrenal glands and kidneys are unremarkable. No acute renal injury or perinephric fluid collection. The bladder is unremarkable. Stomach/Bowel: The stomach, duodenum, small bowel and colon are grossly normal without oral contrast. No inflammatory changes, mass lesions or obstructive findings. The appendix is normal. Diffuse colonic  diverticulosis. Vascular/Lymphatic: Advanced atherosclerotic calcification involving the aorta and branch vessels but no aneurysm. No mesenteric or retroperitoneal mass, adenopathy or hematoma. Reproductive: The prostate gland and seminal vesicles are grossly normal. Other: No pelvic mass or adenopathy. No free pelvic fluid collections. No inguinal mass or adenopathy. No abdominal wall hernia or subcutaneous lesions. Musculoskeletal: No acute bony findings. The right hip prosthesis is normally located. No periprosthetic fracture. The pubic symphysis and SI joints are intact. No pelvic fractures. IMPRESSION: 1. No acute findings in the chest, abdomen or pelvis. 2. Small bilateral pleural effusions. 3. Moderate to large hiatal hernia. 4. Advanced atherosclerotic calcification involving the thoracic and abdominal aorta and branch vessels. 5. Status post cholecystectomy. 6. Diffuse colonic diverticulosis without findings for acute diverticulitis. 7. No significant bony findings. Aortic Atherosclerosis (ICD10-I70.0). Electronically Signed   By: Rudie Meyer M.D.   On: 10/20/2021 16:45   CT Head Wo Contrast  Result Date: 10/20/2021 CLINICAL DATA:  Head trauma. Multiple falls over the past 3 days with progressive weakness. EXAM: CT HEAD WITHOUT CONTRAST CT CERVICAL SPINE WITHOUT CONTRAST TECHNIQUE: Multidetector CT imaging of the head and cervical spine was performed following the standard protocol without intravenous contrast. Multiplanar CT image reconstructions of the cervical spine were also generated. RADIATION DOSE REDUCTION: This exam was performed according to the departmental dose-optimization program which includes automated exposure control, adjustment of the mA and/or kV according to patient size and/or use of iterative reconstruction technique. COMPARISON:  04/04/2021 FINDINGS: CT HEAD FINDINGS Brain: No evidence of acute infarction, hemorrhage, hydrocephalus, extra-axial collection or mass lesion/mass  effect. Remote right basal ganglia and left thalamic lacunar infarcts. There is mild diffuse low-attenuation within the subcortical and periventricular white matter compatible with chronic microvascular disease. Prominence of sulci and ventricles compatible with brain atrophy. Vascular: No hyperdense vessel or unexpected calcification. Skull: Normal. Negative for fracture or focal lesion. Sinuses/Orbits: No acute finding. Other: No signs of scalp laceration or hematoma CT CERVICAL SPINE FINDINGS Alignment: Normal. Skull  base and vertebrae: No acute fracture. No primary bone lesion or focal pathologic process. Soft tissues and spinal canal: No prevertebral fluid or swelling. No visible canal hematoma. Disc levels: Marked degenerative disc disease identified at C5-6. Large ventral osteophytes are identified throughout the cervical spine. Upper chest: Negative. Other: None IMPRESSION: 1. No acute intracranial abnormality. 2. Chronic microvascular disease and brain atrophy. 3. No evidence for cervical spine fracture or subluxation. 4. Cervical degenerative disc disease as above. Electronically Signed   By: Signa Kell M.D.   On: 10/20/2021 16:43   CT Cervical Spine Wo Contrast  Result Date: 10/20/2021 CLINICAL DATA:  Head trauma. Multiple falls over the past 3 days with progressive weakness. EXAM: CT HEAD WITHOUT CONTRAST CT CERVICAL SPINE WITHOUT CONTRAST TECHNIQUE: Multidetector CT imaging of the head and cervical spine was performed following the standard protocol without intravenous contrast. Multiplanar CT image reconstructions of the cervical spine were also generated. RADIATION DOSE REDUCTION: This exam was performed according to the departmental dose-optimization program which includes automated exposure control, adjustment of the mA and/or kV according to patient size and/or use of iterative reconstruction technique. COMPARISON:  04/04/2021 FINDINGS: CT HEAD FINDINGS Brain: No evidence of acute  infarction, hemorrhage, hydrocephalus, extra-axial collection or mass lesion/mass effect. Remote right basal ganglia and left thalamic lacunar infarcts. There is mild diffuse low-attenuation within the subcortical and periventricular white matter compatible with chronic microvascular disease. Prominence of sulci and ventricles compatible with brain atrophy. Vascular: No hyperdense vessel or unexpected calcification. Skull: Normal. Negative for fracture or focal lesion. Sinuses/Orbits: No acute finding. Other: No signs of scalp laceration or hematoma CT CERVICAL SPINE FINDINGS Alignment: Normal. Skull base and vertebrae: No acute fracture. No primary bone lesion or focal pathologic process. Soft tissues and spinal canal: No prevertebral fluid or swelling. No visible canal hematoma. Disc levels: Marked degenerative disc disease identified at C5-6. Large ventral osteophytes are identified throughout the cervical spine. Upper chest: Negative. Other: None IMPRESSION: 1. No acute intracranial abnormality. 2. Chronic microvascular disease and brain atrophy. 3. No evidence for cervical spine fracture or subluxation. 4. Cervical degenerative disc disease as above. Electronically Signed   By: Signa Kell M.D.   On: 10/20/2021 16:43   DG Chest Portable 1 View  Result Date: 10/20/2021 CLINICAL DATA:  Sepsis EXAM: PORTABLE CHEST 1 VIEW COMPARISON:  None Available. 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 Both lungs are clear. The visualized skeletal structures are unremarkable. IMPRESSION: Cardiomegaly without acute abnormality of the lungs in AP portable projection. Electronically Signed   By: Jearld Lesch M.D.   On: 10/20/2021 15:18     Today   Subjective    Cory [name] today has no headache,no chest abdominal pain,no new weakness tingling or numbness, feels much better wants to go home today.     Objective   Blood pressure (!) 157/84, pulse 91, temperature 98.6 F (37 C),  temperature source Oral, resp. rate 12, height 5\' 5"  (1.651 m), weight 73.2 kg, SpO2 94 %.  No intake or output data in the 24 hours ending 10/23/21 0928  Exam  Awake Alert, No new F.N deficits,    [place].AT,PERRAL Supple Neck,   Symmetrical Chest wall movement, Good air movement bilaterally, CTAB RRR,No Gallops,   +ve B.Sounds, Abd Soft, Non tender,  No Cyanosis, Clubbing or edema    Data Review   Recent Labs  Lab 10/20/21 1358 10/21/21 0115 10/22/21 0221 10/23/21 0249  WBC 9.4 13.6* 9.9 6.6  HGB 11.4* 10.1*  10.2* 11.9*  HCT 34.0* 32.3* 29.5* 35.2*  PLT 144* 121* 104* 114*  MCV 88.5 94.2 88.6 88.4  MCH 29.7 29.4 30.6 29.9  MCHC 33.5 31.3 34.6 33.8  RDW 14.2 14.6 14.2 14.1  LYMPHSABS 0.3*  --  0.4* 0.7  MONOABS 0.2  --  0.6 0.8  EOSABS 0.0  --  0.0 0.1  BASOSABS 0.0  --  0.0 0.0    Recent Labs  Lab 10/20/21 1358 10/20/21 1558 10/20/21 2120 10/21/21 0115 10/21/21 1045 10/22/21 0221 10/23/21 0249  NA 132*  --   --  134*  --  134* 133*  K 4.2  --   --  4.3  --  3.5 3.6  CL 101  --   --  103  --  104 101  CO2 20*  --   --  22  --  22 22  GLUCOSE 130*  --   --  106*  --  110* 116*  BUN 37*  --   --  37*  --  41* 38*  CREATININE 2.30*  --   --  2.14*  --  1.86* 1.72*  CALCIUM 9.1  --   --  8.5*  --  8.5* 8.4*  AST 53*  --   --  49*  --  41 38  ALT 26  --   --  25  --  25 26  ALKPHOS 64  --   --  48  --  46 53  BILITOT 1.2  --   --  0.8  --  0.5 0.7  ALBUMIN 3.1*  --   --  2.5*  --  2.4* 2.5*  MG 1.6*  --   --  2.1  --  2.2 2.1  PROCALCITON  --   --   --  32.85  --  20.97 10.65  LATICACIDVEN 2.5* 3.0* 2.3* 1.7  --   --   --   INR 1.3*  --   --   --   --   --   --   BNP  --   --   --   --  717.5* 806.4* 483.3*    Total Time in preparing paper work, data evaluation and todays exam - 35 minutes  Susa Raring M.D on 10/23/2021 at 9:28 AM  [facility]

## 2021-10-23 NOTE — TOC Transition Note (Signed)
Transition of Care [facility]) - CM/SW Discharge Note   Patient Details  Name: Cory [name] MRN: [id number] Date of Birth: 08/24/1940  Transition [facility]) CM/SW Contact:  Lawerance Sabal, RN Phone Number: 10/23/2021, 10:19 AM   Clinical Narrative:    Sherron Monday w patient at bedside. Confirmed needs for neww home oxygen and RW, both ordered through [employer] and will be delivered to the room today. LVM w HH liaison to notify of DC planned for today, Patient states that his family be available to pick him up the hospital later today No other TOC needs identified.     Final next level of care: Home w Home Health Services Barriers to Discharge: No Barriers Identified   Patient Goals and CMS Choice Patient states their goals for this hospitalization and ongoing recovery are:: to get better CMS Medicare.gov Compare Post Acute Care list provided to:: Patient Choice offered to / list presented to : Patient  Discharge Placement                       Discharge Plan and Services   Discharge Planning Services: CM Consult Post Acute Care Choice: Home Health          DME Arranged: Oxygen, Walker rolling DME Agency: [employer] Date DME Agency Contacted: 10/23/21 Time DME Agency Contacted: 1017 Representative spoke with at DME Agency: Lauralee Evener HH Arranged: PT HH Agency: [facility] Date [facility] Agency Contacted: 10/23/21 Time HH Agency Contacted: 1017 Representative spoke with at [facility] Agency: Kandee Keen  Social Determinants of Health (SDOH) Interventions     Readmission Risk Interventions     No data to display

## 2021-10-23 NOTE — Care Management Important Message (Signed)
Important Message  Patient Details  Name: Cory [name] MRN: [id number] Date of Birth: 03/01/1941   Medicare Important Message Given:  Yes     Myonna Chisom Stefan Church 10/23/2021, 3:55 PM

## 2021-10-24 ENCOUNTER — Ambulatory Visit: Payer: Medicare Other | Admitting: Urology

## 2021-10-24 ENCOUNTER — Telehealth: Payer: Self-pay

## 2021-10-24 NOTE — Telephone Encounter (Signed)
Transition Care Management Unsuccessful Follow-up Telephone Call  Date of discharge and from where:  [facility], 10[facility]  Attempts:  1st Attempt  Reason for unsuccessful TCM follow-up call:  Left voice message

## 2021-10-29 ENCOUNTER — Encounter: Payer: Self-pay | Admitting: Physician Assistant

## 2021-10-29 ENCOUNTER — Ambulatory Visit (INDEPENDENT_AMBULATORY_CARE_PROVIDER_SITE_OTHER): Payer: Medicare Other | Admitting: Physician Assistant

## 2021-10-29 VITALS — BP 182/93 | HR 71 | Ht 65.0 in | Wt 150.0 lb

## 2021-10-29 DIAGNOSIS — R6520 Severe sepsis without septic shock: Secondary | ICD-10-CM | POA: Diagnosis not present

## 2021-10-29 DIAGNOSIS — A419 Sepsis, unspecified organism: Secondary | ICD-10-CM

## 2021-10-29 DIAGNOSIS — N401 Enlarged prostate with lower urinary tract symptoms: Secondary | ICD-10-CM | POA: Diagnosis not present

## 2021-10-29 DIAGNOSIS — N138 Other obstructive and reflux uropathy: Secondary | ICD-10-CM

## 2021-10-29 LAB — BLADDER SCAN AMB NON-IMAGING: Scan Result: 14 mL

## 2021-10-29 NOTE — Progress Notes (Signed)
10/29/2021 3:57 PM   Cory [name] 04-26-1941 [id number]  CC: Chief Complaint  Patient presents with   ER Follow up   HPI: Cory [name] is a 81 y.o. 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 coli UTI with possible left pyelonephritis and AKI who presents today for hospital follow-up.   Today he reports feeling well.  He was initially a bit weak upon presentation to clinic, but attributes this to not having eaten yet this morning.  We provided him with some snacks and he is feeling much better now.  He denies any pain and feels he is emptying his bladder well.  During his hospitalization, his urine and blood cultures finalized with ampicillin resistant and amp/sulbactam intermediate E. coli.  He received IV Rocephin and was discharged on oral Keflex.  It appears he was no longer on daily Flomax at the time of admission, but this was restarted while he was in the hospital.  His discharge summary mentions concerns for incomplete bladder emptying versus urinary retention due to BPH as the etiology of his UTI.  PVR 75m.  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   Left scalp - EDC   Stroke ([facility]    on Plavix and ASA   Vasovagal syncope     Surgical History: Past Surgical History:  Procedure Laterality Date   BACK SURGERY     CHOLECYSTECTOMY     CORONARY ARTERY BYPASS GRAFT  2000   DEBRIDEMENT AND CLOSURE WOUND N/A 01/19/2021   Procedure: Closure of scalp defect;  Surgeon: PCindra Presume MD;  [place]  Service: Plastics;  Laterality: N/A;   MOHS  SURGERY     NECK SURGERY      Home Medications:  Allergies as of 10/29/2021       Reactions   Fesoterodine Other (See Comments)   Dry mouth and constipation   Isosorbide Other (See Comments)   Tramadol Other (See Comments)   Grass Pollen(k-o-r-t-swt Vern) Other (See Comments)   Hydrocodone Anxiety   Meperidine Other (See Comments), Rash   Other reaction(s): Other (See Comments) HALLUCINATIONS   Penicillins Rash        Medication List        Accurate as of October 29, 2021  3:57 PM. If you have any questions, ask your nurse or doctor.          acetaminophen 500 MG tablet Commonly known as: TYLENOL Take 1,000 mg by mouth at bedtime.   amLODipine 5 MG tablet Commonly known as: NORVASC TAKE 1 TABLET EVERY DAY   aspirin EC 81 MG tablet Take 1 tablet (81 mg total) by mouth daily. Swallow whole.   atorvastatin 40 MG tablet Commonly known as: LIPITOR TAKE 1 TABLET EVERY DAY   carvedilol 6.25 MG tablet Commonly known as: COREG Take 1 tablet (6.25 mg total) by mouth 2 (two) times daily.   cephALEXin 500 MG capsule Commonly known as: KEFLEX Take 1 capsule (500 mg total) by mouth 3 (three)  times daily for 7 days.   clopidogrel 75 MG tablet Commonly known as: PLAVIX TAKE 1 TABLET EVERY DAY   cycloSPORINE 0.05 % ophthalmic emulsion Commonly known as: RESTASIS 1 drop 2 (two) times a day.   diclofenac Sodium 1 % Gel Commonly known as: VOLTAREN Apply 2 g topically as needed.   escitalopram 5 MG tablet Commonly known as: LEXAPRO Take 10 mg by mouth daily.   famotidine 20 MG tablet Commonly known as: PEPCID Take 20 mg by mouth at bedtime.   fluticasone 50 MCG/ACT nasal spray Commonly known as: FLONASE Place 1 spray into both nostrils daily.   Gemtesa 75 MG Tabs Generic drug: Vibegron Take 1 tablet by mouth daily.   latanoprost 0.005 % ophthalmic solution Commonly known as: XALATAN daily.   melatonin 5 MG Tabs Take 5 mg by mouth at bedtime.   metFORMIN  500 MG tablet Commonly known as: GLUCOPHAGE TAKE 1 TABLET TWICE DAILY WITH MEALS   montelukast 10 MG tablet Commonly known as: SINGULAIR TAKE 1 TABLET (10 MG TOTAL) BY MOUTH AT BEDTIME.   nitroGLYCERIN 0.4 MG SL tablet Commonly known as: NITROSTAT Place 1 tablet (0.4 mg total) under the tongue every 5 (five) minutes as needed.   pantoprazole 40 MG tablet Commonly known as: PROTONIX Take 1 tablet (40 mg total) by mouth daily. What changed: when to take this   tamsulosin 0.4 MG Caps capsule Commonly known as: FLOMAX Take 1 capsule (0.4 mg total) by mouth daily.        Allergies:  Allergies  Allergen Reactions   Fesoterodine Other (See Comments)    Dry mouth and constipation   Isosorbide Other (See Comments)   Tramadol Other (See Comments)   Grass Pollen(K-O-R-T-Swt Vern) Other (See Comments)   Hydrocodone Anxiety   Meperidine Other (See Comments) and Rash    Other reaction(s): Other (See Comments) HALLUCINATIONS    Penicillins Rash    Family History: Family History  Problem Relation Age of Onset   Heart Problems Mother    Heart disease Mother    Heart Problems Father    Heart attack Father    Heart disease Brother     Social History:   reports that he has quit smoking. His smoking use included pipe. He has never used smokeless tobacco. He reports that he does not drink alcohol and does not use drugs.  Physical Exam: BP (!) 182/93   Pulse 71   Ht '5\' 5"'$  (1.651 m)   Wt 150 lb (68 kg)   SpO2 99%   BMI 24.96 kg/m   Constitutional:  Alert and oriented, no acute distress, nontoxic appearing HEENT: [place], AT Cardiovascular: No clubbing, cyanosis, or edema Respiratory: Normal respiratory effort, no increased work of breathing Skin: No rashes, bruises or suspicious lesions Neurologic: Grossly intact, no focal deficits, moving all 4 extremities Psychiatric: Normal mood and affect  Laboratory Data: Results for orders placed or performed in visit on 10/29/21   BLADDER SCAN AMB NON-IMAGING  Result Value Ref Range   Scan Result 14 mL   Assessment & Plan:   1. Severe sepsis ([place]) He received culture appropriate antibiotics for E. coli urosepsis.  He is asymptomatic today.  No further intervention indicated.  2. BPH with obstruction/lower urinary tract symptoms He is emptying appropriately.  Okay to continue Flomax.  He does not wish to pursue bladder outlet procedures at this time.  May revisit this in the future if he develops recurrent UTIs.  Return for keep follow up  as scheduled.  Debroah Loop, PA-C  [facility] [address], [place], [place] [address] [phone]

## 2021-10-31 ENCOUNTER — Ambulatory Visit (INDEPENDENT_AMBULATORY_CARE_PROVIDER_SITE_OTHER): Payer: Medicare Other | Admitting: Family Medicine

## 2021-10-31 ENCOUNTER — Encounter: Payer: Self-pay | Admitting: Family Medicine

## 2021-10-31 VITALS — BP 122/62 | HR 70 | Temp 98.1°F | Ht 65.5 in | Wt 158.5 lb

## 2021-10-31 DIAGNOSIS — N12 Tubulo-interstitial nephritis, not specified as acute or chronic: Secondary | ICD-10-CM | POA: Diagnosis not present

## 2021-10-31 DIAGNOSIS — E1165 Type 2 diabetes mellitus with hyperglycemia: Secondary | ICD-10-CM | POA: Diagnosis not present

## 2021-10-31 DIAGNOSIS — N179 Acute kidney failure, unspecified: Secondary | ICD-10-CM

## 2021-10-31 DIAGNOSIS — A419 Sepsis, unspecified organism: Secondary | ICD-10-CM | POA: Diagnosis not present

## 2021-10-31 DIAGNOSIS — I5032 Chronic diastolic (congestive) heart failure: Secondary | ICD-10-CM

## 2021-10-31 DIAGNOSIS — R6520 Severe sepsis without septic shock: Secondary | ICD-10-CM

## 2021-10-31 DIAGNOSIS — I10 Essential (primary) hypertension: Secondary | ICD-10-CM

## 2021-10-31 NOTE — Patient Instructions (Addendum)
Please stop at the lab to have labs drawn.  If kidney function is back in normal range.. plan to restart lisinopril and possible metformin.  Restart lasix as needed for swelling in legs or if wt increasing > 3 lbs in 24 hour or 5 Lb in 1 week.

## 2021-10-31 NOTE — Progress Notes (Signed)
Patient ID: Cory [name], male    DOB: October 03, 1940, 81 y.o.   MRN: [id number]  This visit was conducted in person.  BP 122/62   Pulse 70   Temp 98.1 F (36.7 C) (Oral)   Ht 5' 5.5" (1.664 m)   Wt 158 lb 8 oz (71.9 kg)   SpO2 99%   BMI 25.97 kg/m    CC:  Chief Complaint  Patient presents with   Hospitalization Follow-up    Subjective:   HPI: Cory [name] is a 81 y.o. male patient of Dr. [name]  with history of HTN, HLD, T2DM, CAD s/p CABG, PAD,  hx of CVA, thoracic AAA, depression and anxietypresenting on 10/31/2021 for Hospitalization Follow-up  This is a complicated patient previously unknown to me.   Reviewed notes in detail. Admit date: 10/20/2021  Discharge date: 10/23/2021 He was diagnosed with sepsis due to UTI/left-sided pyelonephritis and admitted to the hospital    PCP Please obtain BMP/CBC, 2 view CXR in 1week,  (see Discharge instructions)    CT chest showed small pleural effusions.  Severe sepsis E. coli bacteremia due to UTI/with possible left-sided pyelonephritis.  Patient has underlying history of BPH, likely causing urinary retention or at least incomplete bladder emptying, on empiric IV Rocephin, sepsis pathophysiology has resolved, he is at baseline and he appears nontoxic.  Has received IV Rocephin 2 g dose on his hospital days will be transition to 7 more days of oral Keflex, Flomax added.    AKI (acute kidney injury) due to sepsis with some dehydration and hyponatremia- due to #1 above, ACE inhibitor held, has been adequately hydrated, renal function has serially improved, CT abdomen pelvis does not show any signs of urinary retention, good urine output, will be discharged home we will continue to hold lisinopril upon discharge as renal function still not at baseline.  Follow-up with PCP within a week PCP to recheck CBC, BMP next visit and readjust blood pressure medications as appropriate.      At discharge cr 1.72, GFR 39  Elevated troponin with  mild acute on chronic diastolic CHF EF 77% - Trop trend is flat and in non-ACS pattern, he is chest pain-free with nonacute EKG, mild shortness of breath likely due to acute on chronic diastolic CHF EF of 82% in 2022, from excessive fluid resuscitation due to sepsis, hold further fluids, on DAPT along with statin, stable repeat echocardiogram with preserved EF and wall motion.  Currently not a candidate for invasive testing or procedures due to sepsis and bacteremia.  Blood pressure has stabilized he will continue his low-dose beta-blocker at home along with DAPT and statin.  Gentle diuresis with oral Lasix, renal function improving on low-dose Lasix.  1 more dose today, at rest he is on room air upon ambulation he does require some oxygen intermittently and will give 2 L of home oxygen to be used on a as needed basis.  Post discharge PCP to monitor   Today he presents with good  UOP,  some urgency, some frequency, no dysuria, no blood in urine.  No abd pain.  He has noted BMs are  soft but somewhat hard to get out.   He reports no fever  He is back on Gemtesa and flomax ( had stopped and now is back on)    He is holding lisinopril  and metformin until renal function returns to baseline.    Blood sugar not checked at home. BP Readings from Last  3 Encounters:  10/31/21 122/62  10/29/21 (!) 182/93  10/23/21 123/71   He has been not taking lasix lately... he has noted swelling increase in his legs  since he has been.   No SOB.  O2 sat 98-99% at home. Wt Readings from Last 3 Encounters:  10/31/21 158 lb 8 oz (71.9 kg)  10/29/21 150 lb (68 kg)  10/20/21 161 lb 6 oz (73.2 kg)   Has home health coming out.   Relevant past medical, surgical, family and social history reviewed and updated as indicated. Interim medical history since our last visit reviewed. Allergies and medications reviewed and updated. Outpatient Medications Prior to Visit  Medication Sig Dispense Refill   acetaminophen  (TYLENOL) 500 MG tablet Take 1,000 mg by mouth at bedtime.     amLODipine (NORVASC) 5 MG tablet TAKE 1 TABLET EVERY DAY 90 tablet 1   aspirin EC 81 MG tablet Take 1 tablet (81 mg total) by mouth daily. Swallow whole. 90 tablet 3   atorvastatin (LIPITOR) 40 MG tablet TAKE 1 TABLET EVERY DAY 90 tablet 3   carvedilol (COREG) 6.25 MG tablet Take 1 tablet (6.25 mg total) by mouth 2 (two) times daily. 180 tablet 1   clopidogrel (PLAVIX) 75 MG tablet TAKE 1 TABLET EVERY DAY 90 tablet 3   cycloSPORINE (RESTASIS) 0.05 % ophthalmic emulsion 1 drop 2 (two) times a day.     diclofenac Sodium (VOLTAREN) 1 % GEL Apply 2 g topically as needed.     escitalopram (LEXAPRO) 5 MG tablet Take 10 mg by mouth daily.     famotidine (PEPCID) 20 MG tablet Take 20 mg by mouth at bedtime.     fluticasone (FLONASE) 50 MCG/ACT nasal spray Place 1 spray into both nostrils daily. 16 g 3   GEMTESA 75 MG TABS Take 1 tablet by mouth daily.     latanoprost (XALATAN) 0.005 % ophthalmic solution daily.     melatonin 5 MG TABS Take 5 mg by mouth at bedtime.     montelukast (SINGULAIR) 10 MG tablet TAKE 1 TABLET (10 MG TOTAL) BY MOUTH AT BEDTIME. 90 tablet 3   nitroGLYCERIN (NITROSTAT) 0.4 MG SL tablet Place 1 tablet (0.4 mg total) under the tongue every 5 (five) minutes as needed. 5 tablet 0   pantoprazole (PROTONIX) 40 MG tablet Take 1 tablet (40 mg total) by mouth daily. (Patient taking differently: Take 40 mg by mouth 2 (two) times daily.) 90 tablet 3   tamsulosin (FLOMAX) 0.4 MG CAPS capsule Take 1 capsule (0.4 mg total) by mouth daily. 30 capsule 0   metFORMIN (GLUCOPHAGE) 500 MG tablet TAKE 1 TABLET TWICE DAILY WITH MEALS (Patient not taking: Reported on 10/31/2021) 180 tablet 1   No facility-administered medications prior to visit.     Per HPI unless specifically indicated in ROS section below Review of Systems  Constitutional:  Negative for fatigue and fever.  HENT:  Negative for ear pain.   Eyes:  Negative for pain.   Respiratory:  Negative for cough and shortness of breath.   Cardiovascular:  Negative for chest pain, palpitations and leg swelling.  Gastrointestinal:  Negative for abdominal pain.  Genitourinary:  Negative for dysuria.  Musculoskeletal:  Negative for arthralgias.  Neurological:  Negative for syncope, light-headedness and headaches.  Psychiatric/Behavioral:  Negative for dysphoric mood.    Objective:  BP 122/62   Pulse 70   Temp 98.1 F (36.7 C) (Oral)   Ht 5' 5.5" (1.664 m)   Wt 158 lb  8 oz (71.9 kg)   SpO2 99%   BMI 25.97 kg/m   Wt Readings from Last 3 Encounters:  10/31/21 158 lb 8 oz (71.9 kg)  10/29/21 150 lb (68 kg)  10/20/21 161 lb 6 oz (73.2 kg)      Physical Exam Constitutional:      Appearance: He is well-developed.  HENT:     Head: Normocephalic.     Right Ear: Hearing normal.     Left Ear: Hearing normal.     Nose: Nose normal.  Neck:     Thyroid: No thyroid mass or thyromegaly.     Vascular: No carotid bruit.     Trachea: Trachea normal.  Cardiovascular:     Rate and Rhythm: Normal rate and regular rhythm.     Pulses: Normal pulses.     Heart sounds: Heart sounds not distant. No murmur heard.    No friction rub. No gallop.     Comments: No peripheral edema Pulmonary:     Effort: Pulmonary effort is normal. No respiratory distress.     Breath sounds: Normal breath sounds.  Skin:    General: Skin is warm and dry.     Findings: No rash.  Psychiatric:        Speech: Speech normal.        Behavior: Behavior normal.        Thought Content: Thought content normal.       Results for orders placed or performed in visit on 10/29/21  BLADDER SCAN AMB NON-IMAGING  Result Value Ref Range   Scan Result 14 mL     COVID 19 screen:  No recent travel or known exposure to [place] The patient denies respiratory symptoms of COVID 19 at this time. The importance of social distancing was discussed today.   Assessment and Plan    Problem List Items  Addressed This Visit     AKI (acute kidney injury) ([place])    Acute likely due to dehydration  Will reevaluate with labs today. f kidney function is back in normal range.. plan to restart lisinopril and possible metformin.      Relevant Orders   Basic Metabolic Panel (Completed)   CBC with Differential/Platelet (Completed)   Chronic diastolic (congestive) heart failure (HCC)    Mild shortness of breath in hospital likely due to acute on chronic diastolic CHF EF of 42% in 2022, from excessive fluid resuscitation due to sepsis.  Restart lasix as needed for swelling in legs or if wt increasing > 3 lbs in 24 hour or 5 Lb in 1 week.       Hypertension    Chronic, tolerable control also with lisinopril, but given secondary benefits of ACE inhibitor, will plan to restart if kidney function back in normal range with lab reevaluation.  Continue Coreg 6.25 mg p.o. twice daily      Pyelonephritis    Resolved status post completion of antibiotics.      Relevant Orders   Basic Metabolic Panel (Completed)   CBC with Differential/Platelet (Completed)   Severe sepsis ([place])    Secondary to urinary tract infection.  Symptoms now resolved for the most part.  Urinary flow issues likely from BPH now improved back on Flomax and Gemtesa.      Type 2 diabetes mellitus with hyperglycemia, without long-term current use of insulin (HCC) - Primary    Chronic, currently holding metformin given acute renal injury in the hospital.  Due for repeat of BMET.  Orders Placed This Encounter  Procedures   Basic Metabolic Panel   CBC with Differential/Platelet     Eliezer Lofts, MD

## 2021-11-01 ENCOUNTER — Other Ambulatory Visit: Payer: Self-pay | Admitting: Family Medicine

## 2021-11-01 LAB — CBC WITH DIFFERENTIAL/PLATELET
Basophils Absolute: 0 10*3/uL (ref 0.0–0.1)
Basophils Relative: 0.2 % (ref 0.0–3.0)
Eosinophils Absolute: 0.2 10*3/uL (ref 0.0–0.7)
Eosinophils Relative: 2.8 % (ref 0.0–5.0)
HCT: 32.1 % — ABNORMAL LOW (ref 39.0–52.0)
Hemoglobin: 10.7 g/dL — ABNORMAL LOW (ref 13.0–17.0)
Lymphocytes Relative: 19.7 % (ref 12.0–46.0)
Lymphs Abs: 1.1 10*3/uL (ref 0.7–4.0)
MCHC: 33.1 g/dL (ref 30.0–36.0)
MCV: 89.9 fl (ref 78.0–100.0)
Monocytes Absolute: 0.6 10*3/uL (ref 0.1–1.0)
Monocytes Relative: 9.6 % (ref 3.0–12.0)
Neutro Abs: 3.9 10*3/uL (ref 1.4–7.7)
Neutrophils Relative %: 67.7 % (ref 43.0–77.0)
Platelets: 348 10*3/uL (ref 150.0–400.0)
RBC: 3.58 Mil/uL — ABNORMAL LOW (ref 4.22–5.81)
RDW: 14.5 % (ref 11.5–15.5)
WBC: 5.8 10*3/uL (ref 4.0–10.5)

## 2021-11-01 LAB — BASIC METABOLIC PANEL
BUN: 12 mg/dL (ref 6–23)
CO2: 25 mEq/L (ref 19–32)
Calcium: 8.5 mg/dL (ref 8.4–10.5)
Chloride: 103 mEq/L (ref 96–112)
Creatinine, Ser: 1.25 mg/dL (ref 0.40–1.50)
GFR: 53.62 mL/min — ABNORMAL LOW (ref >60.00)
Glucose, Bld: 87 mg/dL (ref 70–99)
Potassium: 4.7 mEq/L (ref 3.5–5.1)
Sodium: 135 mEq/L (ref 135–145)

## 2021-11-08 ENCOUNTER — Ambulatory Visit: Payer: Medicare Other | Admitting: Gastroenterology

## 2021-11-08 DIAGNOSIS — Z87891 Personal history of nicotine dependence: Secondary | ICD-10-CM

## 2021-11-08 DIAGNOSIS — N390 Urinary tract infection, site not specified: Secondary | ICD-10-CM | POA: Diagnosis not present

## 2021-11-08 DIAGNOSIS — B9620 Unspecified Escherichia coli [E. coli] as the cause of diseases classified elsewhere: Secondary | ICD-10-CM | POA: Diagnosis not present

## 2021-11-08 DIAGNOSIS — Z85828 Personal history of other malignant neoplasm of skin: Secondary | ICD-10-CM

## 2021-11-08 DIAGNOSIS — I2510 Atherosclerotic heart disease of native coronary artery without angina pectoris: Secondary | ICD-10-CM

## 2021-11-08 DIAGNOSIS — K219 Gastro-esophageal reflux disease without esophagitis: Secondary | ICD-10-CM

## 2021-11-08 DIAGNOSIS — Z9181 History of falling: Secondary | ICD-10-CM

## 2021-11-08 DIAGNOSIS — E1151 Type 2 diabetes mellitus with diabetic peripheral angiopathy without gangrene: Secondary | ICD-10-CM

## 2021-11-08 DIAGNOSIS — F32A Depression, unspecified: Secondary | ICD-10-CM

## 2021-11-08 DIAGNOSIS — D509 Iron deficiency anemia, unspecified: Secondary | ICD-10-CM

## 2021-11-08 DIAGNOSIS — I69351 Hemiplegia and hemiparesis following cerebral infarction affecting right dominant side: Secondary | ICD-10-CM

## 2021-11-08 DIAGNOSIS — I110 Hypertensive heart disease with heart failure: Secondary | ICD-10-CM

## 2021-11-08 DIAGNOSIS — I5033 Acute on chronic diastolic (congestive) heart failure: Secondary | ICD-10-CM

## 2021-11-08 DIAGNOSIS — N401 Enlarged prostate with lower urinary tract symptoms: Secondary | ICD-10-CM | POA: Diagnosis not present

## 2021-11-08 DIAGNOSIS — F419 Anxiety disorder, unspecified: Secondary | ICD-10-CM

## 2021-11-08 DIAGNOSIS — Z96641 Presence of right artificial hip joint: Secondary | ICD-10-CM

## 2021-11-08 DIAGNOSIS — E871 Hypo-osmolality and hyponatremia: Secondary | ICD-10-CM

## 2021-11-08 DIAGNOSIS — R338 Other retention of urine: Secondary | ICD-10-CM

## 2021-11-08 DIAGNOSIS — Z7982 Long term (current) use of aspirin: Secondary | ICD-10-CM

## 2021-11-08 DIAGNOSIS — E1165 Type 2 diabetes mellitus with hyperglycemia: Secondary | ICD-10-CM

## 2021-11-08 DIAGNOSIS — N179 Acute kidney failure, unspecified: Secondary | ICD-10-CM | POA: Diagnosis not present

## 2021-11-08 DIAGNOSIS — Z7984 Long term (current) use of oral hypoglycemic drugs: Secondary | ICD-10-CM

## 2021-11-08 DIAGNOSIS — Z96653 Presence of artificial knee joint, bilateral: Secondary | ICD-10-CM

## 2021-11-08 DIAGNOSIS — I712 Thoracic aortic aneurysm, without rupture, unspecified: Secondary | ICD-10-CM

## 2021-11-12 NOTE — Progress Notes (Unsigned)
11/13/2021 9:34 AM   Cory [name] 11-21-1940 [id number]  Referring provider: [name], MD [place],  [place] [address]  Urological history 1. BPH with LU TS -aged out of PSA screening -cysto 06/2020 - mild prostamegaly with bladder trabeculation -I PSS *** -PVR *** mL -tamsulosin 0.4 mg daily  2. Urinary frequency -contributing factors of age, BPH, stroke, HTN, diabetes, anxiety and depression -PVR *** mL -Gemtesa 75 mg daily   3. Sepsis E. Coli bacteremia secondary to UTI 09/2021 -non-contrast CT, 09/2021 - NED  No chief complaint on file.    HPI: Cory [name] is a 81 y.o. male who presents today for 6 month follow up.      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   Left scalp - EDC   Stroke ([place])    on Plavix and ASA   Vasovagal syncope     Surgical History: Past Surgical History:  Procedure Laterality Date   BACK SURGERY     CHOLECYSTECTOMY     CORONARY ARTERY BYPASS GRAFT  2000   DEBRIDEMENT AND CLOSURE WOUND N/A 01/19/2021   Procedure: Closure of scalp defect;  Surgeon: Cindra Presume, MD;  Location: [facility];  Service: Plastics;  Laterality: N/A;   MOHS SURGERY     NECK SURGERY      Home Medications:  Allergies as of 11/13/2021       Reactions   Fesoterodine Other (See Comments)   Dry mouth and constipation   Isosorbide Other (See Comments)   Tramadol Other (See Comments)   Grass Pollen(k-o-r-t-swt Vern) Other (See Comments)   Hydrocodone Anxiety   Meperidine Other (See Comments), Rash   Other  reaction(s): Other (See Comments) HALLUCINATIONS   Penicillins Rash        Medication List        Accurate as of November 12, 2021  9:34 AM. If you have any questions, ask your nurse or doctor.          acetaminophen 500 MG tablet Commonly known as: TYLENOL Take 1,000 mg by mouth at bedtime.   amLODipine 5 MG tablet Commonly known as: NORVASC TAKE 1 TABLET EVERY DAY   aspirin EC 81 MG tablet Take 1 tablet (81 mg total) by mouth daily. Swallow whole.   atorvastatin 40 MG tablet Commonly known as: LIPITOR TAKE 1 TABLET EVERY DAY   carvedilol 6.25 MG tablet Commonly known as: COREG Take 1 tablet (6.25 mg total) by mouth 2 (two) times daily.   clopidogrel 75 MG tablet Commonly known as: PLAVIX TAKE 1 TABLET EVERY DAY   cycloSPORINE 0.05 % ophthalmic emulsion Commonly known as: RESTASIS 1 drop 2 (two) times a day.   diclofenac Sodium 1 % Gel Commonly known as: VOLTAREN Apply 2 g topically as needed.   escitalopram 5 MG tablet Commonly known as: LEXAPRO Take 10 mg by mouth daily.   famotidine 20 MG tablet Commonly known as: PEPCID Take 20 mg by mouth at  bedtime.   fluticasone 50 MCG/ACT nasal spray Commonly known as: FLONASE Place 1 spray into both nostrils daily.   Gemtesa 75 MG Tabs Generic drug: Vibegron Take 1 tablet by mouth daily.   latanoprost 0.005 % ophthalmic solution Commonly known as: XALATAN daily.   melatonin 5 MG Tabs Take 5 mg by mouth at bedtime.   montelukast 10 MG tablet Commonly known as: SINGULAIR TAKE 1 TABLET (10 MG TOTAL) BY MOUTH AT BEDTIME.   nitroGLYCERIN 0.4 MG SL tablet Commonly known as: NITROSTAT Place 1 tablet (0.4 mg total) under the tongue every 5 (five) minutes as needed.   pantoprazole 40 MG tablet Commonly known as: PROTONIX Take 1 tablet (40 mg total) by mouth daily. What changed: when to take this   tamsulosin 0.4 MG Caps capsule Commonly known as: FLOMAX Take 1 capsule (0.4 mg total) by mouth daily.         Allergies:  Allergies  Allergen Reactions   Fesoterodine Other (See Comments)    Dry mouth and constipation   Isosorbide Other (See Comments)   Tramadol Other (See Comments)   Grass Pollen(K-O-R-T-Swt Vern) Other (See Comments)   Hydrocodone Anxiety   Meperidine Other (See Comments) and Rash    Other reaction(s): Other (See Comments) HALLUCINATIONS    Penicillins Rash    Family History: Family History  Problem Relation Age of Onset   Heart Problems Mother    Heart disease Mother    Heart Problems Father    Heart attack Father    Heart disease Brother     Social History:  reports that he has quit smoking. His smoking use included pipe. He has never used smokeless tobacco. He reports that he does not drink alcohol and does not use drugs.  ROS: Pertinent ROS in HPI  Physical Exam: There were no vitals taken for this visit.  Constitutional:  Well nourished. Alert and oriented, No acute distress. HEENT: [place] AT, moist mucus membranes.  Trachea midline Cardiovascular: No clubbing, cyanosis, or edema. Respiratory: Normal respiratory effort, no increased work of breathing. GU: No CVA tenderness.  No bladder fullness or masses.  Patient with circumcised/uncircumcised phallus. ***Foreskin easily retracted***  Urethral meatus is patent.  No penile discharge. No penile lesions or rashes. Scrotum without lesions, cysts, rashes and/or edema.  Testicles are located scrotally bilaterally. No masses are appreciated in the testicles. Left and right epididymis are normal. Rectal: Patient with  normal sphincter tone. Anus and perineum without scarring or rashes. No rectal masses are appreciated. Prostate is approximately *** grams, *** nodules are appreciated. Seminal vesicles are normal. Neurologic: Grossly intact, no focal deficits, moving all 4 extremities. Psychiatric: Normal mood and affect.   Laboratory Data: Lab Results  Component Value Date   WBC 5.8 10/31/2021   HGB 10.7  (L) 10/31/2021   HCT 32.1 (L) 10/31/2021   MCV 89.9 10/31/2021   PLT 348.0 10/31/2021    Lab Results  Component Value Date   CREATININE 1.25 10/31/2021   Lab Results  Component Value Date   AST 38 10/23/2021   Lab Results  Component Value Date   ALT 26 10/23/2021    Urinalysis *** I have reviewed the labs.   Pertinent Imaging: CLINICAL DATA:  Multiple falls over the past 3 days with worsening weakness.   EXAM: CT CHEST, ABDOMEN AND PELVIS WITHOUT CONTRAST   TECHNIQUE: Multidetector CT imaging of the chest, abdomen and pelvis was performed following the standard protocol without IV contrast.   RADIATION DOSE REDUCTION:  This exam was performed according to the departmental dose-optimization program which includes automated exposure control, adjustment of the mA and/or kV according to patient size and/or use of iterative reconstruction technique.   COMPARISON:  None Available.   FINDINGS: CT CHEST FINDINGS   Cardiovascular: The heart is within normal limits in size for age. 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 Surgical changes from coronary artery bypass surgery. Extensive three-vessel coronary artery calcifications.   Mediastinum/Nodes: Small scattered mediastinal and hilar lymph nodes but no mass, adenopathy the or hematoma. There is a moderate to large hiatal hernia.   Lungs/Pleura: Small bilateral pleural effusions. Mild emphysematous changes and areas of pulmonary scarring. No infiltrates or pulmonary edema. No pulmonary contusion or pneumothorax. No worrisome pulmonary lesions.   Musculoskeletal: Intact bony thorax. No sternal, rib or thoracic vertebral body fractures.   CT ABDOMEN PELVIS FINDINGS   Hepatobiliary: No acute hepatic injury or perihepatic fluid collection. The gallbladder is surgically absent. No common bile duct dilatation.   Pancreas: No mass, inflammation or ductal  dilatation. No evidence of acute pancreatic injury or peripancreatic fluid collection.   Spleen: Normal size.  No acute injury or perihepatic hematoma.   Adrenals/Urinary Tract: The adrenal glands and kidneys are unremarkable. No acute renal injury or perinephric fluid collection. The bladder is unremarkable.   Stomach/Bowel: The stomach, duodenum, small bowel and colon are grossly normal without oral contrast. No inflammatory changes, mass lesions or obstructive findings. The appendix is normal. Diffuse colonic diverticulosis.   Vascular/Lymphatic: Advanced atherosclerotic calcification involving the aorta and branch vessels but no aneurysm. No mesenteric or retroperitoneal mass, adenopathy or hematoma.   Reproductive: The prostate gland and seminal vesicles are grossly normal.   Other: No pelvic mass or adenopathy. No free pelvic fluid collections. No inguinal mass or adenopathy. No abdominal wall hernia or subcutaneous lesions.   Musculoskeletal: No acute bony findings. The right hip prosthesis is normally located. No periprosthetic fracture. The pubic symphysis and SI joints are intact. No pelvic fractures.   IMPRESSION: 1. No acute findings in the chest, abdomen or pelvis. 2. Small bilateral pleural effusions. 3. Moderate to large hiatal hernia. 4. Advanced atherosclerotic calcification involving the thoracic and abdominal aorta and branch vessels. 5. Status post cholecystectomy. 6. Diffuse colonic diverticulosis without findings for acute diverticulitis. 7. No significant bony findings.   Aortic Atherosclerosis (ICD10-I70.0).     Electronically Signed   By: Marijo Sanes M.D.   On: 10/20/2021 16:45 I have independently reviewed the films.  See HPI.    Assessment & Plan:    1. BPH with LUTS -PVR < 300 cc -bothersome symptoms of a weaken stream, mostly at night -continue conservative management, avoiding bladder irritants and timed voiding's -Continue  tamsulosin 0.4 mg daily - refills given -explained how the tamsulosin works -not interested in bladder outlet procedure at this time     No follow-ups on file.  These notes generated with voice recognition software. I apologize for typographical errors.  Zara Council, PA-C  [facility] [address]  [place], [place] [address] [phone]

## 2021-11-13 ENCOUNTER — Encounter: Payer: Self-pay | Admitting: Urology

## 2021-11-13 ENCOUNTER — Ambulatory Visit (INDEPENDENT_AMBULATORY_CARE_PROVIDER_SITE_OTHER): Payer: Medicare Other | Admitting: Urology

## 2021-11-13 VITALS — BP 128/69 | HR 68 | Ht 65.0 in | Wt 157.0 lb

## 2021-11-13 DIAGNOSIS — N138 Other obstructive and reflux uropathy: Secondary | ICD-10-CM

## 2021-11-13 DIAGNOSIS — N401 Enlarged prostate with lower urinary tract symptoms: Secondary | ICD-10-CM

## 2021-11-13 DIAGNOSIS — I6521 Occlusion and stenosis of right carotid artery: Secondary | ICD-10-CM | POA: Diagnosis not present

## 2021-11-13 DIAGNOSIS — R350 Frequency of micturition: Secondary | ICD-10-CM | POA: Diagnosis not present

## 2021-11-13 LAB — URINALYSIS, COMPLETE
Bilirubin, UA: NEGATIVE
Glucose, UA: NEGATIVE
Ketones, UA: NEGATIVE
Nitrite, UA: NEGATIVE
Protein,UA: NEGATIVE
Specific Gravity, UA: <1.005 — ABNORMAL LOW (ref 1.005–1.030)
Urobilinogen, Ur: 0.2 mg/dL (ref 0.2–1.0)
pH, UA: 6.5 (ref 5.0–7.5)

## 2021-11-13 LAB — MICROSCOPIC EXAMINATION: Bacteria, UA: NONE SEEN

## 2021-11-13 LAB — BLADDER SCAN AMB NON-IMAGING

## 2021-11-13 MED ORDER — GEMTESA 75 MG PO TABS: 1.0000 | ORAL_TABLET | Freq: Every day | ORAL | 3 refills | Status: DC

## 2021-11-13 MED ORDER — TAMSULOSIN HCL 0.4 MG PO CAPS: 0.4000 mg | ORAL_CAPSULE | Freq: Every day | ORAL | 3 refills | Status: DC

## 2021-11-21 ENCOUNTER — Other Ambulatory Visit: Payer: Self-pay | Admitting: Family Medicine

## 2021-12-06 DIAGNOSIS — N12 Tubulo-interstitial nephritis, not specified as acute or chronic: Secondary | ICD-10-CM | POA: Insufficient documentation

## 2021-12-06 DIAGNOSIS — I5032 Chronic diastolic (congestive) heart failure: Secondary | ICD-10-CM | POA: Insufficient documentation

## 2021-12-06 NOTE — Assessment & Plan Note (Signed)
Resolved status post completion of antibiotics.

## 2021-12-06 NOTE — Assessment & Plan Note (Addendum)
Chronic, tolerable control also with lisinopril, but given secondary benefits of ACE inhibitor, will plan to restart if kidney function back in normal range with lab reevaluation.  Continue Coreg 6.25 mg p.o. twice daily

## 2021-12-06 NOTE — Assessment & Plan Note (Signed)
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  Restart lasix as needed for swelling in legs or if wt increasing > 3 lbs in 24 hour or 5 Lb in 1 week.

## 2021-12-06 NOTE — Assessment & Plan Note (Signed)
Chronic, currently holding metformin given acute renal injury in the hospital.  Due for repeat of BMET.

## 2021-12-06 NOTE — Assessment & Plan Note (Signed)
Secondary to urinary tract infection.  Symptoms now resolved for the most part.  Urinary flow issues likely from BPH now improved back on Flomax and Gemtesa.

## 2021-12-06 NOTE — Assessment & Plan Note (Signed)
Acute likely due to dehydration  Will reevaluate with labs today. f kidney function is back in normal range.. plan to restart lisinopril and possible metformin.

## 2021-12-11 ENCOUNTER — Ambulatory Visit (INDEPENDENT_AMBULATORY_CARE_PROVIDER_SITE_OTHER)
Admission: RE | Admit: 2021-12-11 | Discharge: 2021-12-11 | Disposition: A | Discharge status: Home / Self Care | Payer: Medicare Other | Source: Ambulatory Visit | Attending: Family Medicine | Admitting: Family Medicine

## 2021-12-11 ENCOUNTER — Encounter: Payer: Self-pay | Admitting: Family Medicine

## 2021-12-11 ENCOUNTER — Ambulatory Visit (INDEPENDENT_AMBULATORY_CARE_PROVIDER_SITE_OTHER): Payer: Medicare Other | Admitting: Family Medicine

## 2021-12-11 VITALS — BP 118/60 | HR 70 | Temp 97.3°F | Ht 65.0 in | Wt 157.5 lb

## 2021-12-11 DIAGNOSIS — G3184 Mild cognitive impairment, so stated: Secondary | ICD-10-CM

## 2021-12-11 DIAGNOSIS — M25572 Pain in left ankle and joints of left foot: Secondary | ICD-10-CM

## 2021-12-11 NOTE — Assessment & Plan Note (Signed)
Suspect sprain. XR reassuring, will follow-up final read. Advised tylenol and bracing. If no improvement, see ortho or Dr. Lorelei Pont in 2 weeks.

## 2021-12-11 NOTE — Progress Notes (Signed)
   Subjective:     Cory [name] is a 81 y.o. male presenting for Follow-up (Memory )     HPI  #Ankle pain - was picking his dog up a a while ago - has had some rashes since his hospitalization - itching and painful - happened 1 month ago  Hx of fracture in that ankle in 2017 wore a boot at that time  Memory - does have some trouble thinking of words - lives with wife - step daughter is nearby  Review of Systems   Social History   Tobacco Use  Smoking Status Former   Types: Pipe  Smokeless Tobacco Never        Objective:    BP Readings from Last 3 Encounters:  12/11/21 118/60  11/13/21 128/69  10/31/21 122/62   Wt Readings from Last 3 Encounters:  12/11/21 157 lb 8 oz (71.4 kg)  11/13/21 157 lb (71.2 kg)  10/31/21 158 lb 8 oz (71.9 kg)    BP 118/60   Pulse 70   Temp (!) 97.3 F (36.3 C) (Temporal)   Ht '5\' 5"'$  (1.651 m)   Wt 157 lb 8 oz (71.4 kg)   SpO2 97%   BMI 26.21 kg/m    Physical Exam Constitutional:      Appearance: Normal appearance. He is not ill-appearing or diaphoretic.  HENT:     Right Ear: External ear normal.     Left Ear: External ear normal.     Nose: Nose normal.  Eyes:     General: No scleral icterus.    Extraocular Movements: Extraocular movements intact.     Conjunctiva/sclera: Conjunctivae normal.  Cardiovascular:     Rate and Rhythm: Normal rate.  Pulmonary:     Effort: Pulmonary effort is normal.  Musculoskeletal:     Cervical back: Neck supple.     Comments: Left ankle Inspection: anterior swelling w/o erythema Palpation: ttp over area of swelling and the lateral ankle ROM: normal Strength: normal  Skin:    General: Skin is warm and dry.  Neurological:     Mental Status: He is alert. Mental status is at baseline.  Psychiatric:        Mood and Affect: Mood normal.     Left ankle XR (my read): Arterial disease, heel spurs, no obvious fracture      Assessment & Plan:   Problem List Items Addressed  This Visit       Nervous and Auditory   Mild cognitive impairment    MoCA is 25 out of 30.  Advised doing puzzles and keeping active.  Patient already has an advanced directive to complete, his wife has worse memory than him.  Encouraged the importance of updating this.  B12 and thyroid were normal within the last year.        Other   Acute left ankle pain - Primary    Suspect sprain. XR reassuring, will follow-up final read. Advised tylenol and bracing. If no improvement, see ortho or Dr. Lorelei Pont in 2 weeks.       Relevant Orders   DG Ankle Complete Left     Return in about 2 weeks (around 12/25/2021), or if symptoms worsen or fail to improve.  [name], MD

## 2021-12-11 NOTE — Assessment & Plan Note (Addendum)
MoCA is 25 out of 30.  Advised doing puzzles and keeping active.  Patient already has an advanced directive to complete, his wife has worse memory than him.  Encouraged the importance of updating this.  B12 and thyroid were normal within the last year.

## 2021-12-11 NOTE — Patient Instructions (Addendum)
Ankle pain - tylenol ok - try icing the ankle and elevating as needed - x-ray today - if not broken - get ankle brace and wear for next 2 weeks  - if not improving - either see Dr. Lorelei Pont at our office or go back to Ortho

## 2021-12-14 ENCOUNTER — Ambulatory Visit: Payer: Medicare Other | Admitting: Internal Medicine

## 2021-12-14 NOTE — Progress Notes (Deleted)
Name: Cory [name]  MRN/ DOB: [id number], [birth year]    Age/ Sex: 81 y.o., male    PCP: [name], MD   Reason for Endocrinology Evaluation: Hyponatremia     Date of Initial Endocrinology Evaluation: 08/06/2021    HPI: Mr. Cory [name] is a 81 y.o. male with a past medical history of HTN, CAD, Hx of CVA, Barrett's esophagus, Has hx of Moh's scalp sx and T2DM. The patient presented for initial endocrinology clinic visit on 08/06/2021 for consultative assistance with his hyponatremia.   Patient has been noted with hyponatremia in 2022 with a nadir of 128 mmol/L in  03/2021  Denies head injury  Has LE edema , wears compression socks daily  No Hx of thyroid disease    His cortisol was normal and was started on Furosemide 07/2021  SUBJECTIVE:    Today (12/14/21):  Cory [name] is here for a follow up on Hyponatremia.   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 His smoking use included pipe. He has never used smokeless tobacco. He reports that he does not drink alcohol and does not use drugs. Family History: family history includes Heart Problems in his father and mother; Heart attack in his father; Heart disease in his brother and mother.   HOME MEDICATIONS: Allergies as of 12/14/2021       Reactions   Fesoterodine Other (See Comments)   Dry mouth and constipation   Isosorbide Other (See Comments)   Tramadol Other (See Comments)   Grass Pollen(k-o-r-t-swt Vern) Other (See Comments)   Hydrocodone Anxiety   Meperidine Other (See Comments), Rash   Other reaction(s): Other (See Comments) HALLUCINATIONS   Penicillins Rash        Medication List        Accurate as of December 14, 2021  6:42 AM. If you have any questions, ask your nurse or doctor.          acetaminophen 500 MG tablet Commonly known as: TYLENOL Take 1,000 mg by mouth at bedtime.   amLODipine 5 MG tablet Commonly known as: NORVASC TAKE 1 TABLET EVERY DAY   aspirin  EC 81 MG tablet Take 1 tablet (81 mg total) by mouth daily. Swallow whole.   atorvastatin 40 MG tablet Commonly known as: LIPITOR TAKE 1 TABLET EVERY DAY   carvedilol 6.25 MG tablet Commonly known as: COREG Take 1 tablet (6.25 mg total) by mouth 2 (two) times daily.   clopidogrel 75 MG tablet Commonly known as: PLAVIX TAKE 1 TABLET EVERY DAY   cycloSPORINE 0.05 % ophthalmic emulsion Commonly known as: RESTASIS 1 drop 2 (two) times a day.   diclofenac Sodium 1 % Gel Commonly known as: VOLTAREN Apply 2 g topically as needed.   escitalopram 5 MG tablet Commonly known as: LEXAPRO Take 10 mg by mouth daily.   famotidine 20 MG tablet Commonly known as: PEPCID Take 20 mg by mouth at bedtime.   fluticasone 50 MCG/ACT nasal spray Commonly known as: FLONASE Place 1 spray into both nostrils daily.   Gemtesa 75 MG  Tabs Generic drug: Vibegron Take 1 tablet by mouth daily.   latanoprost 0.005 % ophthalmic solution Commonly known as: XALATAN daily.   melatonin 5 MG Tabs Take 5 mg by mouth at bedtime.   montelukast 10 MG tablet Commonly known as: SINGULAIR TAKE 1 TABLET (10 MG TOTAL) BY MOUTH AT BEDTIME.   nitroGLYCERIN 0.4 MG SL tablet Commonly known as: NITROSTAT Place 1 tablet (0.4 mg total) under the tongue every 5 (five) minutes as needed.   pantoprazole 40 MG tablet Commonly known as: PROTONIX Take 1 tablet (40 mg total) by mouth daily. What changed: when to take this   tamsulosin 0.4 MG Caps capsule Commonly known as: FLOMAX Take 1 capsule (0.4 mg total) by mouth daily.          REVIEW OF SYSTEMS: A comprehensive ROS was conducted with the patient and is negative except as per HPI    OBJECTIVE:  VS: There were no vitals taken for this visit.   Wt Readings from Last 3 Encounters:  12/11/21 157 lb 8 oz (71.4 kg)  11/13/21 157 lb (71.2 kg)  10/31/21 158 lb 8 oz (71.9 kg)     EXAM: General: Pt appears well and is in NAD  Neck: General: Supple without adenopathy. Thyroid: Thyroid size normal.  No goiter or nodules appreciated.   Lungs: Clear with good BS bilat with no rales, rhonchi, or wheezes  Heart: Auscultation: RRR.+ systolic murmur   Abdomen: Normoactive bowel sounds, soft, nontender, without masses or organomegaly palpable  Extremities:  BL LE: 1+ pretibial edema   Mental Status: Judgment, insight: Intact Orientation: Oriented to time, place, and person Mood and affect: No depression, anxiety, or agitation     DATA REVIEWED:  Latest Reference Range & Units 08/06/21 13:57  Sodium 135 - 145 mEq/L 130 (L)  Potassium 3.5 - 5.1 mEq/L 5.2 No hemolysis seen (H)  Chloride 96 - 112 mEq/L 96  CO2 19 - 32 mEq/L 26  Glucose 70 - 99 mg/dL 83  BUN 6 - 23 mg/dL 15  Creatinine 0.40 - 1.50 mg/dL 0.87  Calcium 8.4 - 10.5 mg/dL 9.4  GFR >60.00 mL/min 80.47    Latest  Reference Range & Units 08/06/21 13:57  Osmolality 278 - 305 mOsm/kg 279     Latest Reference Range & Units 08/06/21 13:57  Osmolality, Urine 50 - 1,200 mOsm/kg 499  Sodium, Urine 28 - 272 mmol/L 21 (L)      ASSESSMENT/PLAN/RECOMMENDATIONS:   Hyponatremia:  Hyponatremia is due to low effective arterial blood volume which is evidenced by peripheral edema, the  cause of this is unclear to me, as I don't see Hx of CHF or Cirrhosis in the chart, but the patient does have a systolic murmur, and I do suspect this is cardiac in origin.  Low effective arterial blood volume will cause release of ADH. Typically those patient do not respond very well to fluid restriction and it may be necessary to treat the underlying cause of the edema.   -Serum sodium goal at or above 130 mmol/L -His serum osmolality is low, his urine sodium< 30 at 21 mmol/L , this excludes SIADH as a diagnosis, which at this point his hyponatremia is beyond the scope of endocrinology -Given that he was also noted with hyperkalemia, will proceed with cosyntropin stimulation test   Follow-up in 4 months  Signed electronically by: Mack Guise, MD  [facility]  [address] [place], [place] [address] Phone: [phone] FAX: [phone]   CC: [name], MD [place] [address] Phone: [phone] Fax: [phone]   Return to Endocrinology clinic as below: Future Appointments  Date Time Provider [place]  12/14/2021 12:10 PM Ashante Snelling, Melanie Crazier, MD [facility] None  12/20/2021  1:45 PM Jonathon Bellows, MD [facility] None  12/25/2021 12:00 PM [name], MD [facility]  01/02/2022  2:30 PM Ralene Bathe, MD [facility] None  01/03/2022  1:45 PM Gardiner Barefoot, DPM [facility]  04/02/2022  1:40 PM Wellington Hampshire, MD [facility]  11/14/2022 10:00 AM McGowan, Hunt Oris, PA-C [facility] None

## 2021-12-17 ENCOUNTER — Encounter: Payer: Self-pay | Admitting: Family Medicine

## 2021-12-17 ENCOUNTER — Ambulatory Visit (INDEPENDENT_AMBULATORY_CARE_PROVIDER_SITE_OTHER): Payer: Medicare Other | Admitting: Family Medicine

## 2021-12-17 ENCOUNTER — Ambulatory Visit (INDEPENDENT_AMBULATORY_CARE_PROVIDER_SITE_OTHER)
Admission: RE | Admit: 2021-12-17 | Discharge: 2021-12-17 | Disposition: A | Discharge status: Home / Self Care | Payer: Medicare Other | Source: Ambulatory Visit | Attending: Family Medicine | Admitting: Family Medicine

## 2021-12-17 VITALS — BP 118/68 | HR 61 | Temp 98.6°F | Resp 16 | Ht 65.0 in | Wt 161.1 lb

## 2021-12-17 DIAGNOSIS — M545 Low back pain, unspecified: Secondary | ICD-10-CM | POA: Insufficient documentation

## 2021-12-17 DIAGNOSIS — M25552 Pain in left hip: Secondary | ICD-10-CM

## 2021-12-17 NOTE — Assessment & Plan Note (Signed)
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  No signs of nerve compression.  Discussed physical therapy, he declined.  We discussed consideration for HLA-B27 testing due to morning stiffness and difficulty standing up.  Will start with injection and if symptoms or not improving will consider testing in the future.

## 2021-12-17 NOTE — Progress Notes (Signed)
Subjective:     Cory [name] is a 81 y.o. male presenting for Hip Pain (Left hip and lower back X 5 days When he gets up in the morning it is stiff. ) and Back Pain (Lower left X 5 days)     Hip Pain  There was no injury mechanism. The pain is present in the left hip. Pertinent negatives include no numbness or tingling.  Back Pain This is a new problem. The current episode started in the past 7 days. The problem occurs constantly. The pain is present in the lumbar spine. The quality of the pain is described as aching. Radiates to: left hip. Stiffness is present In the morning. Associated symptoms include leg pain. Pertinent negatives include no bladder incontinence, bowel incontinence, dysuria, fever, numbness, pelvic pain or tingling.    Impacting sleep Having a hard time finding a comfortable position Morning stiffness  Hx of right hip replacement - had several injections leading up to his   Review of Systems  Constitutional:  Negative for fever.  Gastrointestinal:  Negative for bowel incontinence.  Genitourinary:  Negative for bladder incontinence, dysuria and pelvic pain.  Musculoskeletal:  Positive for back pain.  Neurological:  Negative for tingling and numbness.     Social History   Tobacco Use  Smoking Status Former   Types: Pipe  Smokeless Tobacco Never        Objective:    BP Readings from Last 3 Encounters:  12/17/21 118/68  12/11/21 118/60  11/13/21 128/69   Wt Readings from Last 3 Encounters:  12/17/21 161 lb 2 oz (73.1 kg)  12/11/21 157 lb 8 oz (71.4 kg)  11/13/21 157 lb (71.2 kg)    BP 118/68   Pulse 61   Temp 98.6 F (37 C)   Resp 16   Ht 5' 5"  (1.651 m)   Wt 161 lb 2 oz (73.1 kg)   SpO2 97%   BMI 26.81 kg/m    Physical Exam Musculoskeletal:     Comments: Back Inspection: no abnormalities Palpation: No spinous process ttp, no Paraspinous ttp ROM: normal flexion, extension, rotation, and lateral flexion, pain with lateral  flexion Strength: Normal bilateral Right hip flexion/extension, Right knee flexion/extension, Right dorsiflexion/extension. Left hip extension 4/5, otherwise 5/5 left side Straight leg raise - negative b/l  Left Hip Inspection: no abnormalities Palpation: ttp along the lateral hip ROM: very restricted lateral rotation, normal internal rotation      XR Left hip (my read): bone spurs, mild arthritis, no fractures      Assessment & Plan:   Problem List Items Addressed This Visit       Other   Left hip pain - Primary    X-ray with arthritis, exam consistent with limited range of motion.  Discussed avoiding NSAIDs due to kidney and heart.  Okay for Tylenol.  He will return to see Dr. Lorelei Pont for consideration for joint injection.  Offered physical therapy but he declined.      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  No signs of nerve compression.  Discussed physical therapy, he declined.  We discussed consideration for HLA-B27 testing due to morning stiffness and difficulty standing up.  Will start with injection and if symptoms or not improving will consider testing in the future.  Return for [place].  [name], MD

## 2021-12-17 NOTE — Patient Instructions (Signed)
Schedule to see Dr. Lorelei Pont for evaluation and injection   Consider physical therapy  Ok for tylenol

## 2021-12-17 NOTE — Assessment & Plan Note (Signed)
X-ray with arthritis, exam consistent with limited range of motion.  Discussed avoiding NSAIDs due to kidney and heart.  Okay for Tylenol.  He will return to see Dr. Lorelei Pont for consideration for joint injection.  Offered physical therapy but he declined.

## 2021-12-18 NOTE — Progress Notes (Unsigned)
    Cory [name] T. Cory Templeman, MD, [place] at [facility] [address] [place], [address]  Phone: [phone]  FAX: [place] [name] - 81 y.o. male  MRN [id number]  Date of Birth: 11/10/1940  Date: 12/19/2021  PCP: Cory Noe, MD  Referral: Cory Noe, MD  No chief complaint on file.  Subjective:   Cory [name] is a 81 y.o. very pleasant male patient with There is no height or weight on file to calculate BMI. who presents with the following:  Very pleasant 81 year old gentleman presents with some acute on chronic left-sided hip pain.  He is seeing courteous of my partner Dr. Einar [name].    Review of Systems is noted in the HPI, as appropriate  Objective:   There were no vitals taken for this visit.  GEN: No acute distress; alert,appropriate. PULM: Breathing comfortably in no respiratory distress PSYCH: Normally interactive.   Laboratory and Imaging Data:  Assessment and Plan:   ***

## 2021-12-19 ENCOUNTER — Ambulatory Visit (INDEPENDENT_AMBULATORY_CARE_PROVIDER_SITE_OTHER): Payer: Medicare Other | Admitting: Family Medicine

## 2021-12-19 ENCOUNTER — Encounter: Payer: Self-pay | Admitting: Family Medicine

## 2021-12-19 VITALS — BP 150/74 | HR 66 | Temp 98.1°F | Ht 65.0 in | Wt 160.0 lb

## 2021-12-19 DIAGNOSIS — M7062 Trochanteric bursitis, left hip: Secondary | ICD-10-CM | POA: Diagnosis not present

## 2021-12-19 DIAGNOSIS — S8012XA Contusion of left lower leg, initial encounter: Secondary | ICD-10-CM

## 2021-12-19 DIAGNOSIS — G8929 Other chronic pain: Secondary | ICD-10-CM

## 2021-12-19 DIAGNOSIS — M1610 Unilateral primary osteoarthritis, unspecified hip: Secondary | ICD-10-CM | POA: Diagnosis not present

## 2021-12-19 DIAGNOSIS — I6521 Occlusion and stenosis of right carotid artery: Secondary | ICD-10-CM | POA: Diagnosis not present

## 2021-12-19 DIAGNOSIS — M5137 Other intervertebral disc degeneration, lumbosacral region: Secondary | ICD-10-CM

## 2021-12-19 DIAGNOSIS — M25552 Pain in left hip: Secondary | ICD-10-CM

## 2021-12-19 MED ORDER — TRIAMCINOLONE ACETONIDE 40 MG/ML IJ SUSP
40.0000 mg | Freq: Once | INTRAMUSCULAR | Status: AC
  Administered 2021-12-19: 40 mg via INTRA_ARTICULAR

## 2021-12-20 ENCOUNTER — Ambulatory Visit (INDEPENDENT_AMBULATORY_CARE_PROVIDER_SITE_OTHER): Payer: Self-pay | Admitting: Gastroenterology

## 2021-12-20 ENCOUNTER — Ambulatory Visit: Payer: Medicare Other | Admitting: Gastroenterology

## 2021-12-20 ENCOUNTER — Encounter: Payer: Self-pay | Admitting: Gastroenterology

## 2021-12-20 VITALS — BP 176/82 | HR 64 | Temp 97.9°F | Wt 159.6 lb

## 2021-12-20 DIAGNOSIS — Z91199 Patient's noncompliance with other medical treatment and regimen due to unspecified reason: Secondary | ICD-10-CM

## 2021-12-20 NOTE — Progress Notes (Signed)
Cancelled.  

## 2021-12-25 ENCOUNTER — Ambulatory Visit: Payer: Medicare Other | Admitting: Family Medicine

## 2021-12-27 ENCOUNTER — Other Ambulatory Visit: Payer: Self-pay | Admitting: Family Medicine

## 2021-12-27 ENCOUNTER — Other Ambulatory Visit: Payer: Self-pay | Admitting: Cardiovascular Disease

## 2021-12-27 DIAGNOSIS — J302 Other seasonal allergic rhinitis: Secondary | ICD-10-CM

## 2021-12-27 DIAGNOSIS — I6930 Unspecified sequelae of cerebral infarction: Secondary | ICD-10-CM

## 2022-01-02 ENCOUNTER — Ambulatory Visit (INDEPENDENT_AMBULATORY_CARE_PROVIDER_SITE_OTHER): Payer: Medicare Other | Admitting: Dermatology

## 2022-01-02 DIAGNOSIS — L570 Actinic keratosis: Secondary | ICD-10-CM

## 2022-01-02 DIAGNOSIS — I6521 Occlusion and stenosis of right carotid artery: Secondary | ICD-10-CM

## 2022-01-02 DIAGNOSIS — Z85828 Personal history of other malignant neoplasm of skin: Secondary | ICD-10-CM | POA: Diagnosis not present

## 2022-01-02 DIAGNOSIS — L578 Other skin changes due to chronic exposure to nonionizing radiation: Secondary | ICD-10-CM | POA: Diagnosis not present

## 2022-01-02 DIAGNOSIS — L859 Epidermal thickening, unspecified: Secondary | ICD-10-CM

## 2022-01-02 NOTE — Progress Notes (Signed)
   Follow-Up Visit   Subjective  Cory [name] is a 81 y.o. male who presents for the following: Actinic Keratosis (Scalp, face, ears, 28mf/u,). The patient has spots, moles and lesions to be evaluated, some may be new or changing and the patient has concerns that these could be cancer.  The following portions of the chart were reviewed this encounter and updated as appropriate:   Tobacco  Allergies  Meds  Problems  Med Hx  Surg Hx  Fam Hx     Review of Systems:  No other skin or systemic complaints except as noted in HPI or Assessment and Plan.  Objective  Well appearing patient in no apparent distress; mood and affect are within normal limits.  A focused examination was performed including scalp, face, ears. Relevant physical exam findings are noted in the Assessment and Plan.  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) Face, scalp x 16 May consider 5FU/Calcipotriene in future Destruction of lesion - Face, scalp x 16 Complexity: simple   Destruction method: cryotherapy   Informed consent: discussed and consent obtained   Timeout:  patient name, date of birth, surgical site, and procedure verified Lesion destroyed using liquid nitrogen: Yes   Region frozen until ice ball extended beyond lesion: Yes   Outcome: patient tolerated procedure well with no complications   Post-procedure details: wound care instructions given    Actinic Damage - chronic, secondary to cumulative UV radiation exposure/sun exposure over time - diffuse scaly erythematous macules with underlying dyspigmentation - Recommend daily broad spectrum sunscreen SPF 30+ to sun-exposed areas, reapply every 2 hours as needed.  - Recommend staying in the shade or wearing long sleeves, sun glasses (UVA+UVB protection) and wide brim hats (4-inch brim  around the entire circumference of the hat). - Call for new or changing lesions.   Return in about 6 weeks (around 02/13/2022) for AK f/u, Hyperkeratosis scalp f/u.  I, SOthelia Pulling RMA, am acting as scribe for DSarina Ser MD . Documentation: I have reviewed the above documentation for accuracy and completeness, and I agree with the above.  DSarina Ser MD

## 2022-01-02 NOTE — Patient Instructions (Addendum)
Cryotherapy Aftercare  Wash gently with soap and water everyday.   Apply Vaseline and Band-Aid daily until healed.   Restart Urea 40% cream to scaly area on scalp until smooth  Due to recent changes in healthcare laws, you may see results of your pathology and/or laboratory studies on MyChart before the doctors have had a chance to review them. We understand that in some cases there may be results that are confusing or concerning to you. Please understand that not all results are received at the same time and often the doctors may need to interpret multiple results in order to provide you with the best plan of care or course of treatment. Therefore, we ask that you please give [place] 2 business days to thoroughly review all your results before contacting the office for clarification. Should we see a critical lab result, you will be contacted sooner.   If You Need Anything After Your Visit  If you have any questions or concerns for your doctor, please call our main line at [phone] and press option 4 to reach your doctor's medical assistant. If no one answers, please leave a voicemail as directed and we will return your call as soon as possible. Messages left after 4 pm will be answered the following business day.   You may also send [place] a message via [place]. We typically respond to MyChart messages within 1-2 business days.  For prescription refills, please ask your pharmacy to contact our office. Our fax number is [phone].  If you have an urgent issue when the clinic is closed that cannot wait until the next business day, you can page your doctor at the number below.    Please note that while we do our best to be available for urgent issues outside of office hours, we are not available 24/7.   If you have an urgent issue and are unable to reach [place], you may choose to seek medical care at your doctor's office, retail clinic, urgent care center, or emergency room.  If you have a medical  emergency, please immediately call 911 or go to the emergency department.  Pager Numbers  - Dr. Nehemiah Massed: [phone]  - Dr. Laurence Ferrari: [phone]  - Dr. Nicole Kindred: [phone]  In the event of inclement weather, please call our main line at [phone] for an update on the status of any delays or closures.  Dermatology Medication Tips: Please keep the boxes that topical medications come in in order to help keep track of the instructions about where and how to use these. Pharmacies typically print the medication instructions only on the boxes and not directly on the medication tubes.   If your medication is too expensive, please contact our office at [phone] option 4 or send [place] a message through [place].   We are unable to tell what your co-pay for medications will be in advance as this is different depending on your insurance coverage. However, we may be able to find a substitute medication at lower cost or fill out paperwork to get insurance to cover a needed medication.   If a prior authorization is required to get your medication covered by your insurance company, please allow [place] 1-2 business days to complete this process.  Drug prices often vary depending on where the prescription is filled and some pharmacies may offer cheaper prices.  The website www.goodrx.com contains coupons for medications through different pharmacies. The prices here do not account for what the cost may be with help from insurance (it  may be cheaper with your insurance), but the website can give you the price if you did not use any insurance.  - You can print the associated coupon and take it with your prescription to the pharmacy.  - You may also stop by our office during regular business hours and pick up a GoodRx coupon card.  - If you need your prescription sent electronically to a different pharmacy, notify our office through [facility] or by phone at [phone] option 4.     Si Usted  Necesita Algo Despus de Su Visita  Tambin puede enviarnos un mensaje a travs de Pharmacist, community. Por lo general respondemos a los mensajes de MyChart en el transcurso de 1 a 2 das hbiles.  Para renovar recetas, por favor pida a su farmacia que se ponga en contacto con nuestra oficina. Harland Dingwall de fax es [place] [phone].  Si tiene un asunto urgente cuando la clnica est cerrada y que no puede esperar hasta el siguiente da hbil, puede llamar/localizar a su doctor(a) al nmero que aparece a continuacin.   Por favor, tenga en cuenta que aunque hacemos todo lo posible para estar disponibles para asuntos urgentes fuera del horario de [place], no estamos disponibles las 24 horas del da, los 7 das de la [place].   Si tiene un problema urgente y no puede comunicarse con nosotros, puede optar por buscar atencin mdica  en el consultorio de su doctor(a), en una clnica privada, en un centro de atencin urgente o en una sala de emergencias.  Si tiene Engineering geologist, por favor llame inmediatamente al 911 o vaya a la sala de emergencias.  Nmeros de bper  - Dr. Nehemiah Massed: [phone]  - Dra. Moye: [phone]  - Dra. Nicole Kindred: [phone]  En caso de inclemencias del [place], por favor llame a Johnsie Kindred principal al [phone] para una actualizacin sobre el [place] de cualquier retraso o cierre.  Consejos para la medicacin en dermatologa: Por favor, guarde las cajas en las que vienen los medicamentos de uso tpico para ayudarle a seguir las instrucciones sobre dnde y cmo usarlos. Las farmacias generalmente imprimen las instrucciones del medicamento slo en las cajas y no directamente en los tubos del [place].   Si su medicamento es muy caro, por favor, pngase en contacto con Zigmund Daniel llamando al [phone] y presione la opcin 4 o envenos un mensaje a travs de Pharmacist, community.   No podemos decirle cul ser su copago por los medicamentos por adelantado ya que esto es  diferente dependiendo de la cobertura de su seguro. Sin embargo, es posible que podamos encontrar un medicamento sustituto a Electrical engineer un formulario para que el seguro cubra el medicamento que se considera necesario.   Si se requiere una autorizacin previa para que su compaa de seguros [place] su medicamento, por favor permtanos de 1 a 2 das hbiles para completar este proceso.  Los precios de los medicamentos varan con frecuencia dependiendo del Environmental consultant de dnde se surte la receta y alguna farmacias pueden ofrecer precios ms baratos.  El sitio web www.goodrx.com tiene cupones para medicamentos de Airline pilot. Los precios aqu no tienen en cuenta lo que podra costar con la ayuda del seguro (puede ser ms barato con su seguro), pero el sitio web puede darle el precio si no utiliz Research scientist (physical sciences).  - Puede imprimir el cupn correspondiente y llevarlo con su receta a la farmacia.  - Tambin puede pasar por nuestra oficina durante el horario de atencin regular  y recoger una tarjeta de cupones de GoodRx.  - Si necesita que su receta se enve electrnicamente a una farmacia diferente, informe a nuestra oficina a travs de MyChart de [place] o por telfono llamando al [phone] y presione la opcin 4.

## 2022-01-03 ENCOUNTER — Ambulatory Visit (INDEPENDENT_AMBULATORY_CARE_PROVIDER_SITE_OTHER): Payer: Medicare Other | Admitting: Podiatry

## 2022-01-03 ENCOUNTER — Encounter: Payer: Self-pay | Admitting: Podiatry

## 2022-01-03 DIAGNOSIS — B351 Tinea unguium: Secondary | ICD-10-CM

## 2022-01-03 DIAGNOSIS — M79674 Pain in right toe(s): Secondary | ICD-10-CM

## 2022-01-03 DIAGNOSIS — E119 Type 2 diabetes mellitus without complications: Secondary | ICD-10-CM | POA: Diagnosis not present

## 2022-01-03 DIAGNOSIS — M79675 Pain in left toe(s): Secondary | ICD-10-CM

## 2022-01-03 NOTE — Progress Notes (Signed)
This patient returns to my office for at risk foot care.  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  This patient is unable to cut nails himself since the patient cannot reach his nails.These nails are painful walking and wearing shoes.  This patient presents for at risk foot care today.  General Appearance  Alert, conversant and in no acute stress.  Vascular  Dorsalis pedis and posterior tibial  pulses are palpable  bilaterally.  Capillary return is within normal limits  bilaterally. Temperature is within normal limits  bilaterally.  Neurologic  Senn-Weinstein monofilament wire test within normal limits  bilaterally. Muscle power within normal limits bilaterally.  Nails Thick disfigured discolored nails with subungual debris  from hallux to fifth toes right foot. No evidence of bacterial infection or drainage bilaterally.  Orthopedic .  No crepitus or effusions noted.  No bony pathology or digital deformities noted. No rearfoot motion STJ and ankle  right foot. Abnormal positioning third toe right foot.  Skin  normotropic skin with no porokeratosis noted bilaterally.  No signs of infections or ulcers noted.     Onychomycosis  Pain in right toes  Pain in left toes  Consent was obtained for treatment procedures.   Mechanical debridement of nails 1-5  bilaterally performed with a nail nipper.  Filed with dremel without incident.    Return office visit   3 months                   Told patient to return for periodic foot care and evaluation due to potential at risk complications.   Antonietta Lansdowne DPM   

## 2022-01-07 ENCOUNTER — Encounter: Payer: Self-pay | Admitting: Dermatology

## 2022-01-11 ENCOUNTER — Telehealth: Payer: Self-pay | Admitting: Family Medicine

## 2022-01-11 DIAGNOSIS — E1165 Type 2 diabetes mellitus with hyperglycemia: Secondary | ICD-10-CM

## 2022-01-11 NOTE — Telephone Encounter (Signed)
Patient called in wanting to know if he needs to keep taking metformin. If so he needs a prescription sent over to [place], [place]. Thank you!

## 2022-01-14 MED ORDER — METFORMIN HCL 500 MG PO TABS: 500.0000 mg | ORAL_TABLET | Freq: Every day | ORAL | 1 refills | Status: DC

## 2022-01-14 NOTE — Telephone Encounter (Signed)
Refill provided.   Recommend hgb a1c check when he has TOC

## 2022-01-14 NOTE — Telephone Encounter (Signed)
Spoke to pt and relayed Dr. Verda Cumins message. Pt scheduled for TOC with Matt in late October.  Metformin Rx sent to mail order as requested by pt and cancelled at local pharmacy.

## 2022-01-14 NOTE — Addendum Note (Signed)
Addended by: Loreen Freud on: 01/14/2022 03:34 PM   Modules accepted: Orders

## 2022-01-14 NOTE — Addendum Note (Signed)
Addended by: Waunita Schooner R on: 01/14/2022 02:02 PM   Modules accepted: Orders

## 2022-01-16 ENCOUNTER — Ambulatory Visit: Payer: Medicare Other

## 2022-01-16 ENCOUNTER — Ambulatory Visit (INDEPENDENT_AMBULATORY_CARE_PROVIDER_SITE_OTHER): Payer: Medicare Other

## 2022-01-16 DIAGNOSIS — Z23 Encounter for immunization: Secondary | ICD-10-CM | POA: Diagnosis not present

## 2022-01-25 ENCOUNTER — Encounter: Payer: Self-pay | Admitting: Internal Medicine

## 2022-01-25 ENCOUNTER — Ambulatory Visit (INDEPENDENT_AMBULATORY_CARE_PROVIDER_SITE_OTHER): Payer: Medicare Other | Admitting: Internal Medicine

## 2022-01-25 VITALS — BP 130/74 | HR 65 | Ht 65.0 in | Wt 162.0 lb

## 2022-01-25 DIAGNOSIS — I509 Heart failure, unspecified: Secondary | ICD-10-CM

## 2022-01-25 DIAGNOSIS — E871 Hypo-osmolality and hyponatremia: Secondary | ICD-10-CM

## 2022-01-25 LAB — BASIC METABOLIC PANEL
BUN: 15 mg/dL (ref 6–23)
CO2: 26 mEq/L (ref 19–32)
Calcium: 9 mg/dL (ref 8.4–10.5)
Chloride: 99 mEq/L (ref 96–112)
Creatinine, Ser: 1.22 mg/dL (ref 0.40–1.50)
GFR: 55.11 mL/min — ABNORMAL LOW (ref >60.00)
Glucose, Bld: 89 mg/dL (ref 70–99)
Potassium: 4.7 mEq/L (ref 3.5–5.1)
Sodium: 133 mEq/L — ABNORMAL LOW (ref 135–145)

## 2022-01-25 NOTE — Progress Notes (Unsigned)
Name: Cory [name]  MRN/ DOB: [id number], 05/30/1940    Age/ Sex: 81 y.o., male    PCP: [name], MD   Reason for Endocrinology Evaluation: Hyponatremia     Date of Initial Endocrinology Evaluation: 01/25/2022     HPI: Cory [name] is a 81 y.o. male with a past medical history of HTN, CAD, Hx of CVA, Barrett's esophagus, and T2DM. The patient presented for initial endocrinology clinic visit on 01/25/2022 for consultative assistance with his hyponatremia.   Patient has been noted with hyponatremia in 2022 with a nadir of 128 mmol/L in  03/2021 His serum osmolality was noted to be low, his urine sodium< 30 at 21 mmol/L , this excluded SIADH as a diagnosis  Cosyntropin stimulation test was normal 07/2021  No Hx of thyroid disease    SUBJECTIVE:    Today (01/25/22): Cory [name] is here for a follow  up on hyponatremia .   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 His smoking use included pipe. He has never used smokeless tobacco. He reports that he does not drink alcohol and does not use drugs. Family History: family history includes Heart Problems in his father and mother; Heart attack in his father; Heart disease in his brother and mother.   HOME MEDICATIONS: Allergies as of 01/25/2022       Reactions   Fesoterodine Other (See Comments)   Dry mouth and constipation   Isosorbide Other (See Comments)   Tramadol Other (See Comments)   Grass Pollen(k-o-r-t-swt Vern) Other (See Comments)   Hydrocodone Anxiety   Meperidine Other (See Comments), Rash   Other reaction(s): Other (See Comments) HALLUCINATIONS   Penicillins Rash        Medication List        Accurate as of January 25, 2022 11:25 AM. If you have any questions, ask your nurse or doctor.          acetaminophen 500 MG tablet Commonly known as: TYLENOL Take 1,000 mg by mouth at bedtime.   amLODipine 5 MG tablet Commonly known as: NORVASC TAKE 1 TABLET EVERY DAY  aspirin EC 81 MG tablet Take 1 tablet (81 mg total) by mouth daily. Swallow whole.   atorvastatin 40 MG tablet Commonly known as: LIPITOR TAKE 1 TABLET EVERY DAY   carvedilol 6.25 MG tablet Commonly known as: COREG TAKE 1 TABLET TWICE DAILY (STOP CLONIDINE)   clopidogrel 75 MG tablet Commonly known as: PLAVIX TAKE 1 TABLET EVERY DAY   cycloSPORINE 0.05 % ophthalmic emulsion Commonly known as: RESTASIS 1 drop 2 (two) times a day.   diclofenac Sodium 1 % Gel Commonly known as: VOLTAREN Apply 2 g topically as needed.   escitalopram 10 MG tablet Commonly known as: LEXAPRO Take 10 mg by mouth daily.   famotidine 20 MG tablet Commonly known as: PEPCID Take 20 mg by mouth at bedtime.   fluticasone 50 MCG/ACT nasal spray Commonly known as: FLONASE USE 1 SPRAY IN BOTH NOSTRILS  DAILY   Gemtesa 75 MG Tabs Generic drug: Vibegron Take 1 tablet by mouth daily.   latanoprost 0.005 % ophthalmic solution Commonly known as: XALATAN daily.   lisinopril 10 MG tablet Commonly known as: ZESTRIL Take 10 mg by mouth daily.   melatonin 5 MG Tabs Take 5 mg by mouth at bedtime.   metFORMIN 500 MG tablet Commonly known as: GLUCOPHAGE Take 1 tablet (500 mg total) by mouth daily with breakfast.   montelukast 10 MG tablet Commonly known as: SINGULAIR TAKE 1 TABLET (10 MG TOTAL) BY MOUTH AT BEDTIME.   nitroGLYCERIN 0.4 MG SL tablet Commonly known as: NITROSTAT Place 1 tablet (0.4 mg total) under the tongue every 5 (five) minutes as needed.   pantoprazole 40 MG tablet Commonly known as: PROTONIX Take 1 tablet (40 mg total) by mouth daily. What changed: when to take this   tamsulosin 0.4 MG Caps capsule Commonly known as: FLOMAX Take 1 capsule (0.4 mg total) by mouth daily.          REVIEW OF SYSTEMS: A comprehensive ROS was conducted with the patient and is negative except as per HPI    OBJECTIVE:  VS: BP 130/74 (BP Location: Left Arm, Patient Position: Sitting, Cuff Size: Small)   Pulse 65   Ht '5\' 5"'$  (1.651 m)   Wt 162 lb (73.5 kg)   SpO2 97%   BMI 26.96 kg/m    Wt Readings from Last 3 Encounters:  01/25/22 162 lb (73.5 kg)  12/20/21 159 lb 9.6 oz (72.4 kg)  12/19/21 160 lb (72.6 kg)     EXAM: General: Pt appears well and is in NAD  Neck: General: Supple without adenopathy. Thyroid: Thyroid size normal.  No goiter or nodules appreciated.   Lungs: Clear with good BS bilat with no rales, rhonchi, or wheezes  Heart: Auscultation: RRR.+ systolic murmur   Abdomen: Normoactive bowel sounds, soft, nontender, without masses or organomegaly palpable  Extremities:  BL LE: Trace  pretibial edema   Mental Status: Judgment, insight: Intact Orientation: Oriented to time, place, and person Mood and affect: No depression, anxiety, or agitation      DATA REVIEWED:    Latest Reference Range & Units 01/25/22 12:13  Sodium 135 - 145 mEq/L 133 (L)  Potassium 3.5 - 5.1 mEq/L 4.7  Chloride 96 - 112 mEq/L 99  CO2 19 - 32 mEq/L 26  Glucose 70 - 99 mg/dL 89  BUN 6 - 23 mg/dL 15  Creatinine 0.40 - 1.50 mg/dL 1.22  Calcium 8.4 - 10.5 mg/dL 9.0  GFR >60.00 mL/min 55.11 (L)  (L): Data is abnormally low  Latest Reference Range & Units 08/06/21 13:57  Osmolality 278 - 305 mOsm/kg 279     Latest Reference Range & Units 08/06/21 13:57  Osmolality, Urine 50 - 1,200 mOsm/kg 499  Sodium, Urine 28 - 272 mmol/L 21 (L)   Cosyntropin stimulation test 08/17/2021  Latest Reference Range & Units 08/17/21 11:16 08/17/21 11:51 08/17/21 12:18  Cortisol, Plasma ug/dL 13.2 21.0 26.3   ASSESSMENT/PLAN/RECOMMENDATIONS:   Hyponatremia:  Hyponatremia is due to low effective arterial blood volume which is evidenced by peripheral edema, the cause of this is CHF. Low effective arterial blood volume will cause release of ADH.   -Serum sodium goal at or above 130 mmol/L -His serum osmolality is low, his urine sodium< 30 at 21 mmol/L , this excludes SIADH as a diagnosis, which at this point his hyponatremia is beyond the scope of endocrinology -Cosyntropin stimulation test is normal April 2023  - Will defer further management to PCP/Cardiology if treatment of hyponatremia and edema become problematic   NO further endocrinology follow up is needed  Signed electronically by: Mack Guise, MD  [facility]  [address] [place], [place] [address] Phone: [phone] FAX: [phone]   CC: [name], [place] [address] Phone: [phone] Fax: [phone]   Return to Endocrinology clinic as below: Future Appointments  Date Time Provider [place]  02/04/2022 12:00 PM Owens Loffler, MD [facility]  02/13/2022  3:00 PM Ralene Bathe,  MD [facility] None  02/25/2022  2:00 PM Michela Pitcher, NP [facility]  04/02/2022  1:40 PM Wellington Hampshire, MD [facility] None  04/04/2022  3:45 PM Gardiner Barefoot, DPM [facility]  11/14/2022 10:00 AM McGowan, Hunt Oris, PA-C [facility] None

## 2022-01-31 IMAGING — DX DG ELBOW COMPLETE 3+V*R*
4 series · 4 of 4 positions shown · non-contrast
Comparison: None.

CLINICAL DATA: Fall and trauma to the right elbow.

EXAM:
RIGHT ELBOW - COMPLETE 3+ VIEW

[elbow ap]
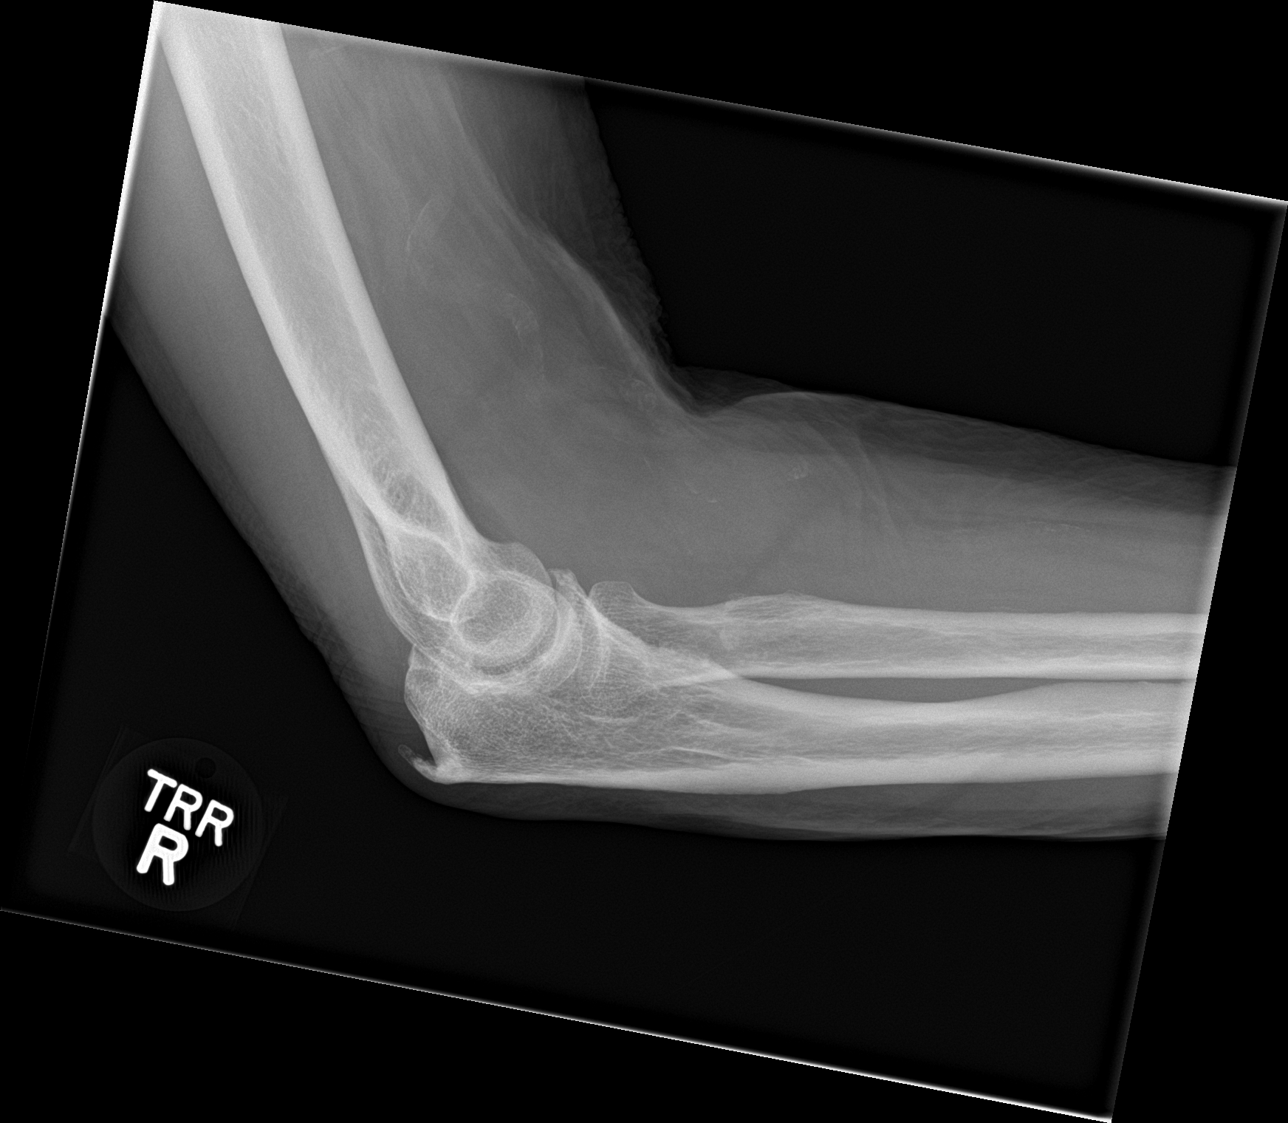

[elbow obl (1 of 2)]
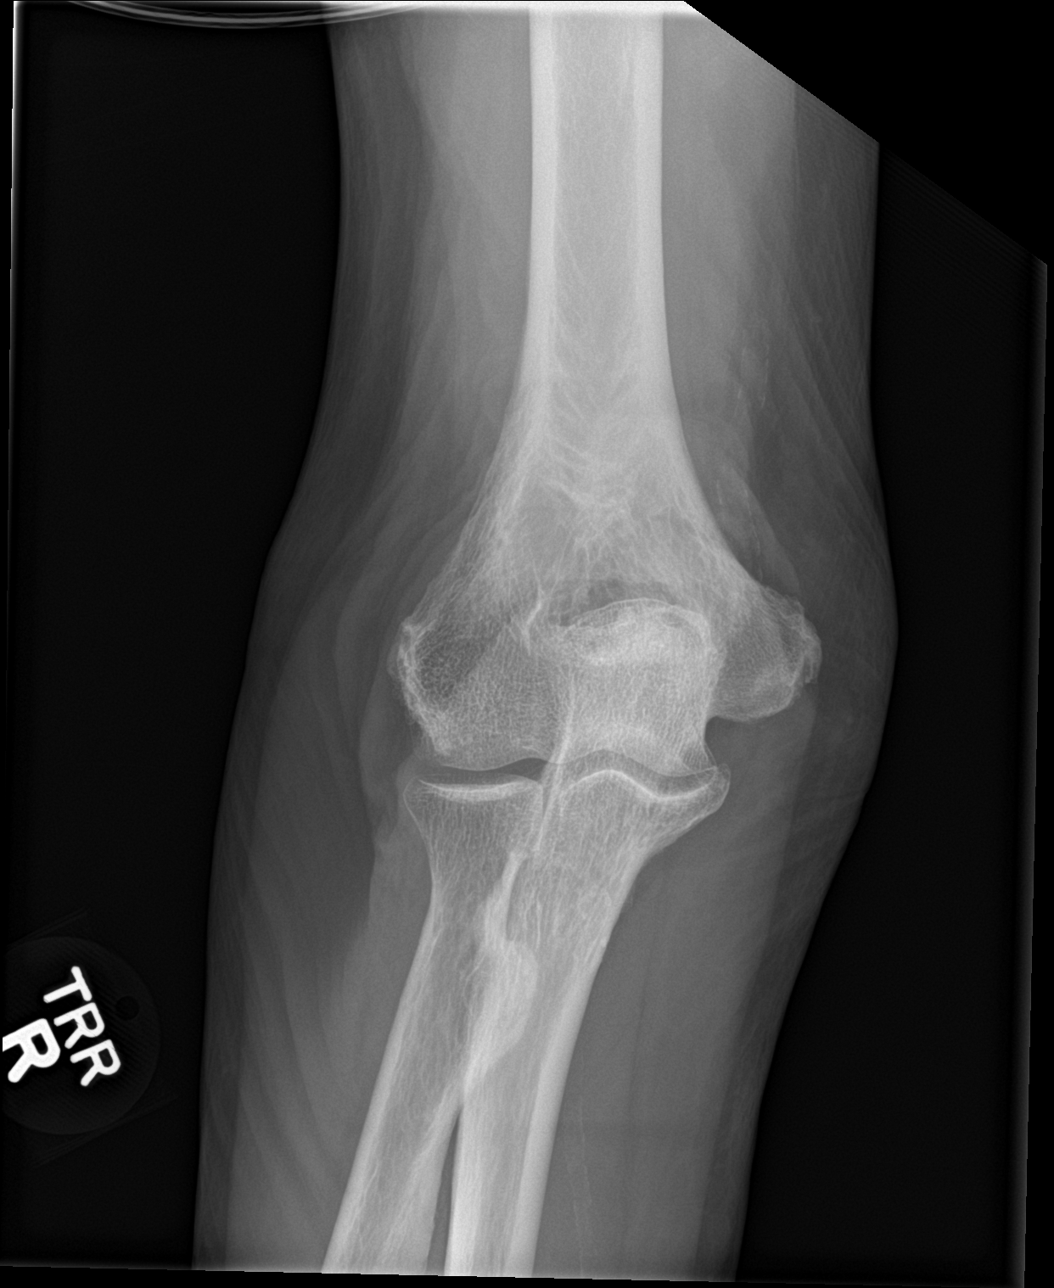

[elbow obl (2 of 2)]
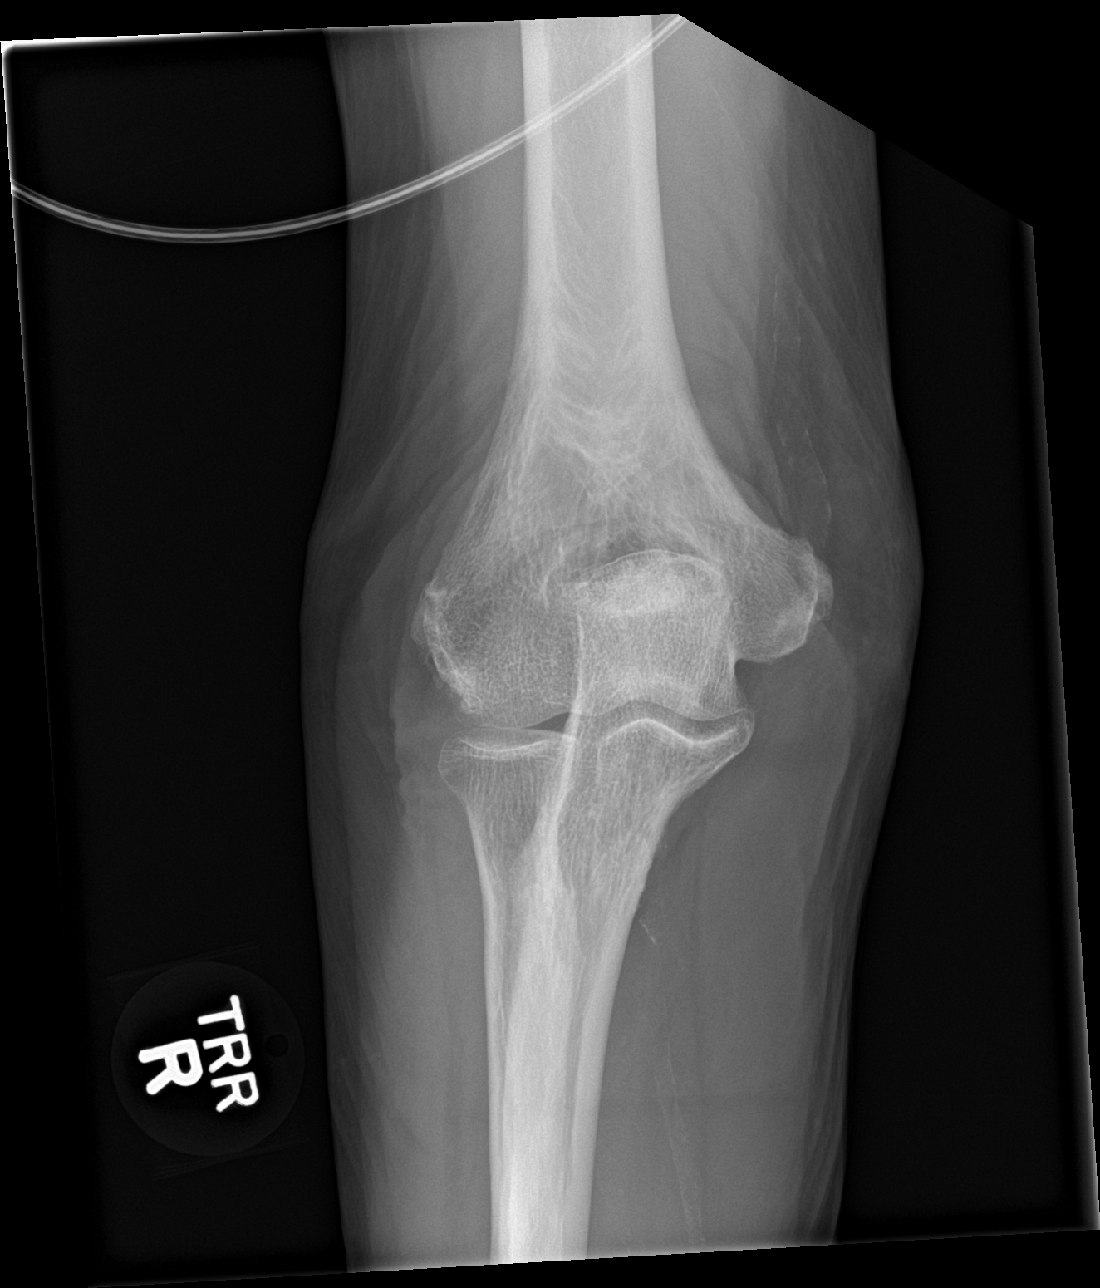

[elbow lat]
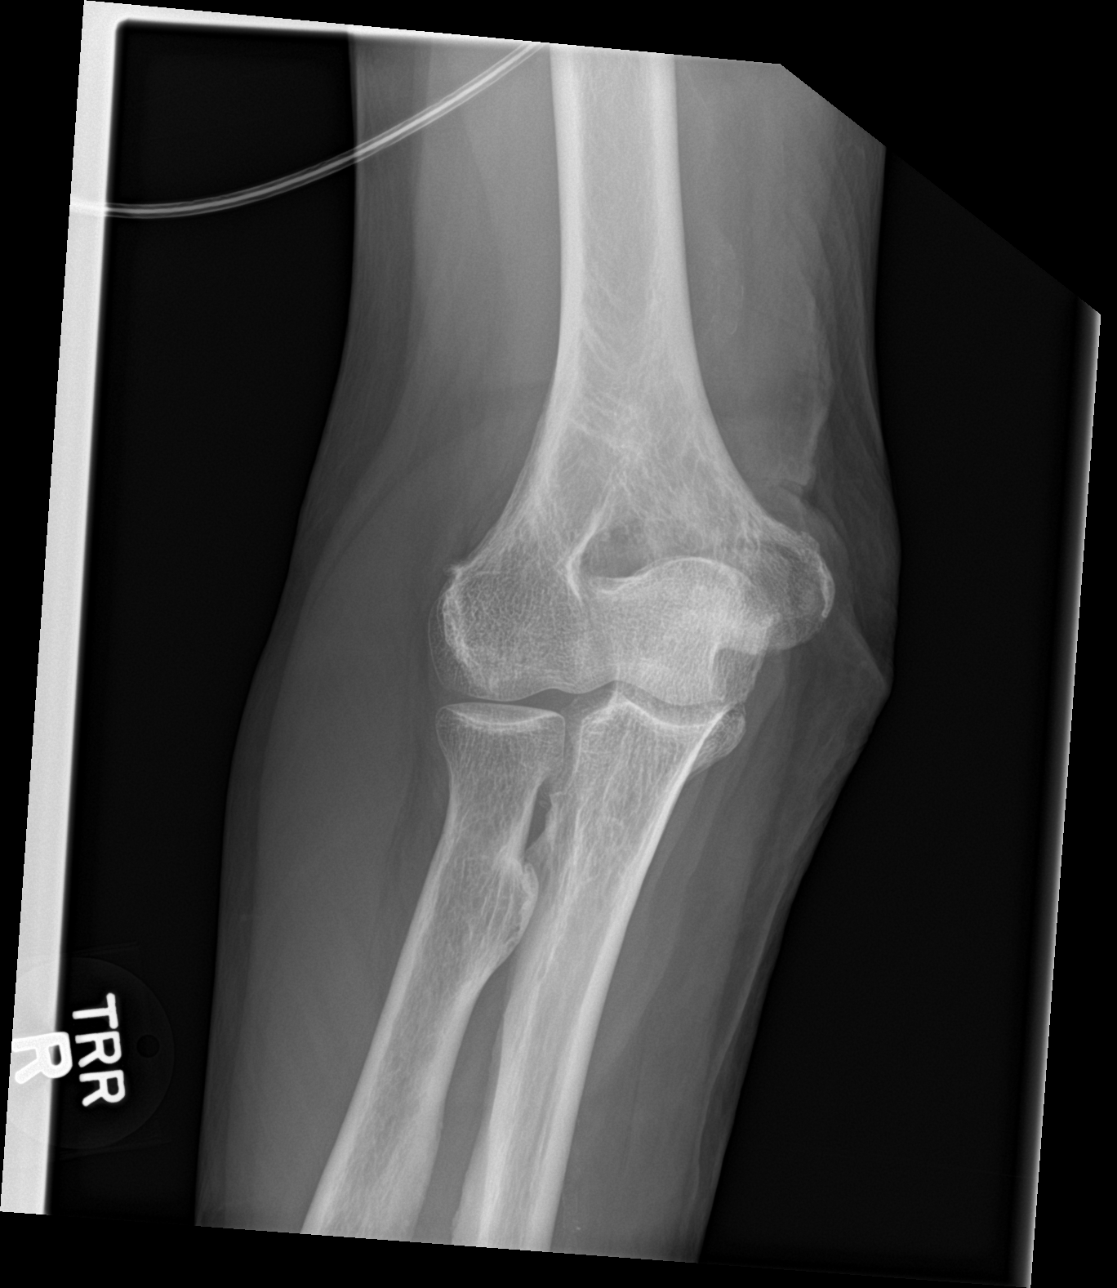

[4 of 4 positions shown; findings below may reference images not displayed]

FINDINGS: There is no acute fracture or dislocation. The bones are osteopenic.
No joint effusion. There is a 1 cm olecranon spur. The soft tissues
are unremarkable. Vascular calcifications noted.
IMPRESSION: No acute fracture or dislocation.

## 2022-01-31 IMAGING — CT CT HEAD W/O CM
4 series · 16 of 47 positions shown, 18 images · non-contrast
Comparison: CT brain 05/06/2020, MRI 05/06/2020

CLINICAL DATA: Hypertension syncope

EXAM:
CT HEAD WITHOUT CONTRAST
TECHNIQUE: Contiguous axial images were obtained from the base of the skull
through the vertex without intravenous contrast.

[Series 2: head bone · axial · 0.42mm/px · z∈[-112,-82]mm · 3 of 77 slices shown]
[im 8/77  bone]
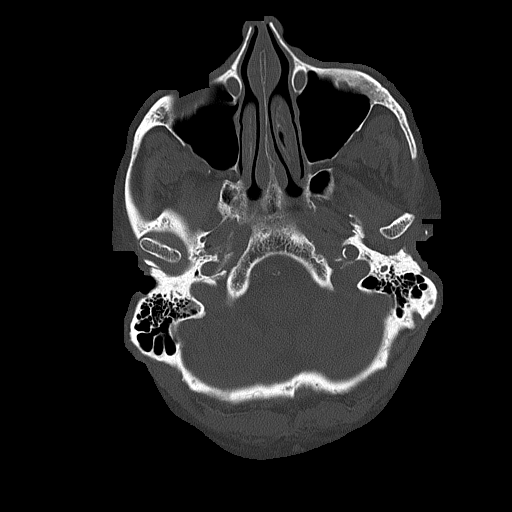
[im 16/77  bone]
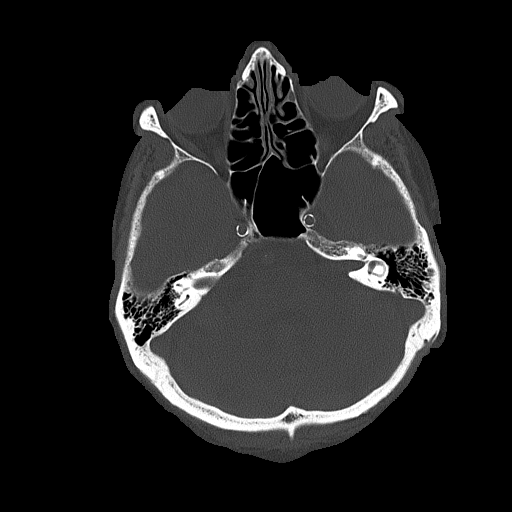
[im 23/77  bone]
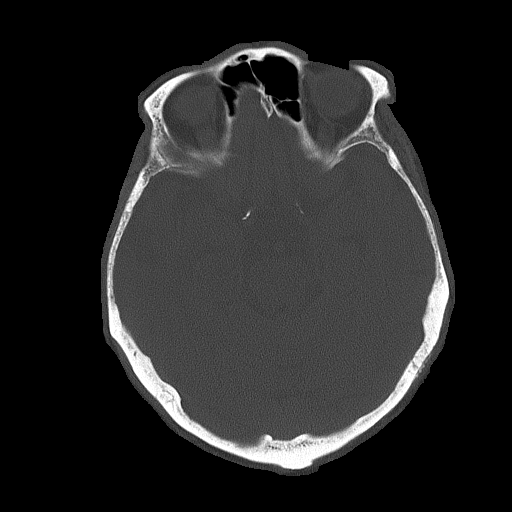

[Series 3: head wo · axial · 0.42mm/px · z∈[-111,+4]mm · 7 of 31 slices shown, 9 images]
[im 4/31  brain]
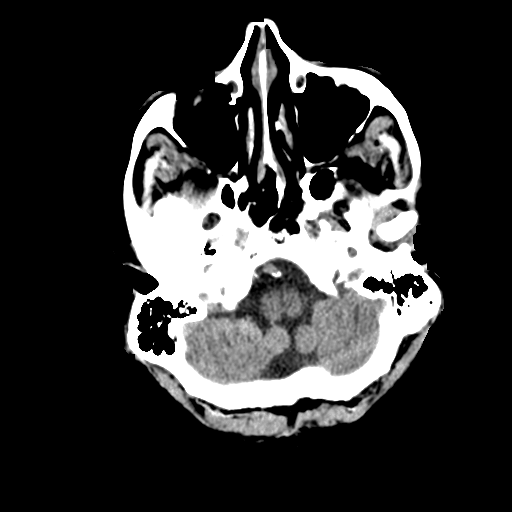
[im 4/31  bone]
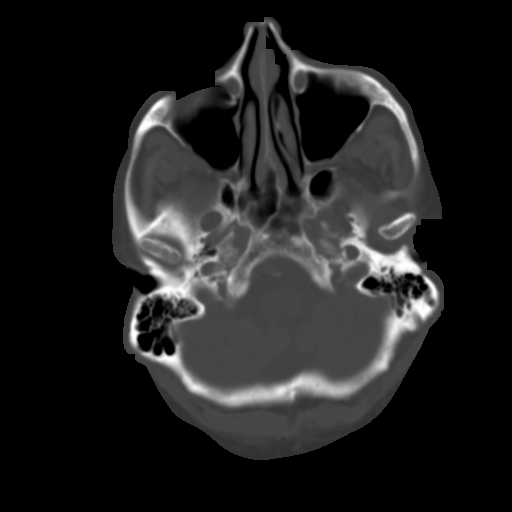
[im 8/31  brain]
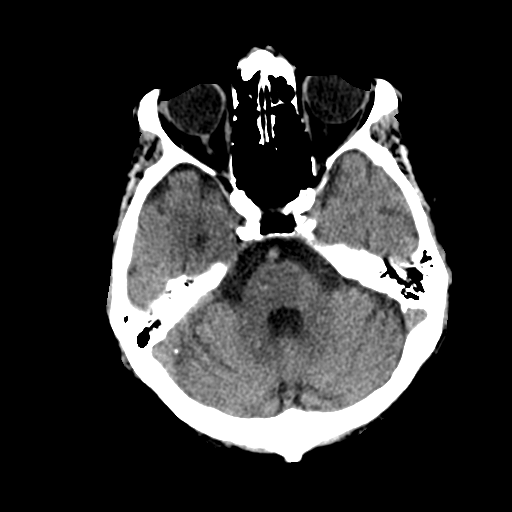
[im 12/31  brain]
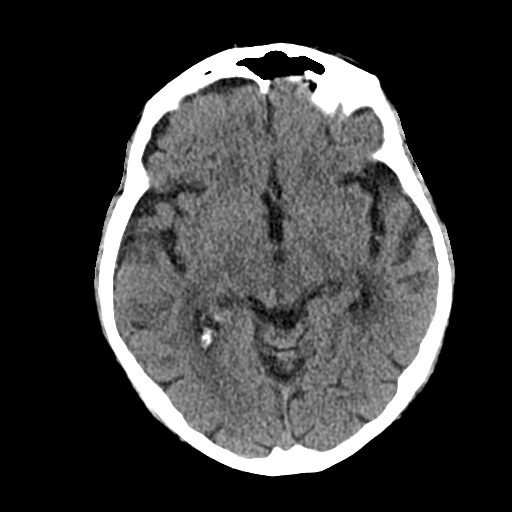
[im 16/31  brain]
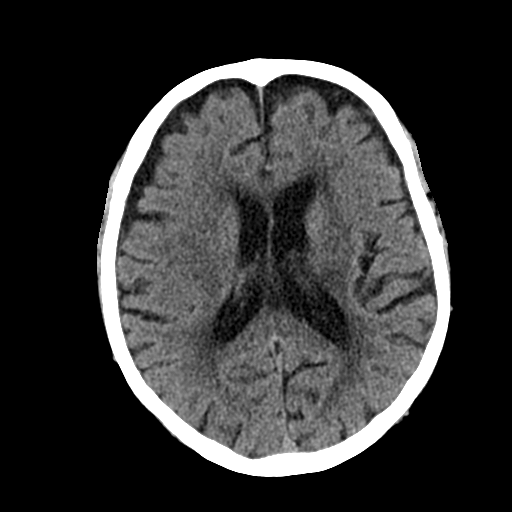
[im 19/31  brain]
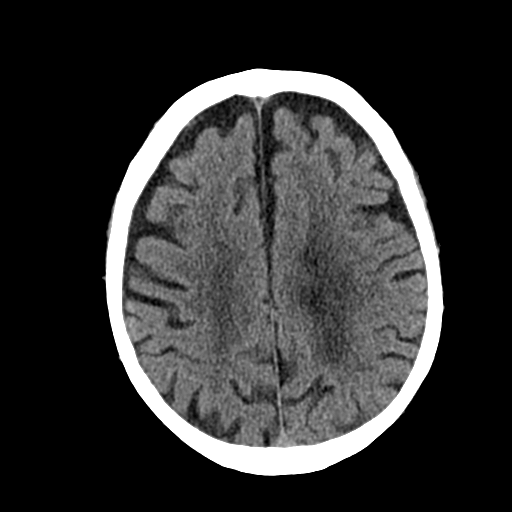
[im 19/31  bone]
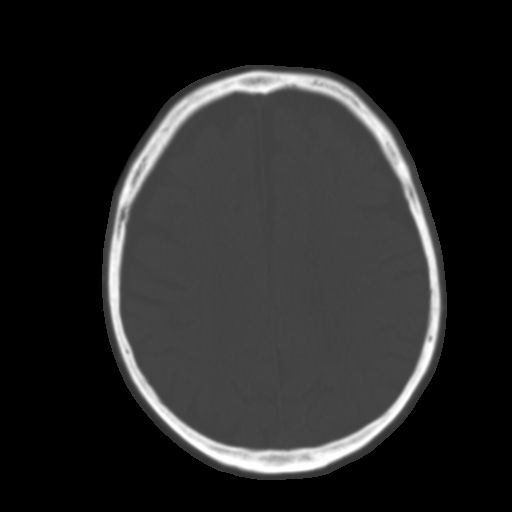
[im 23/31  brain]
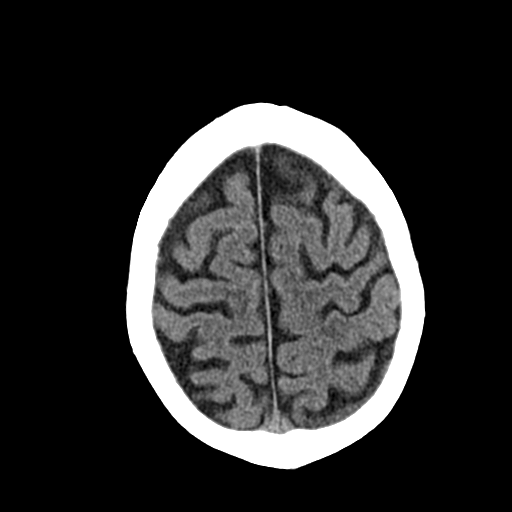
[im 27/31  brain]
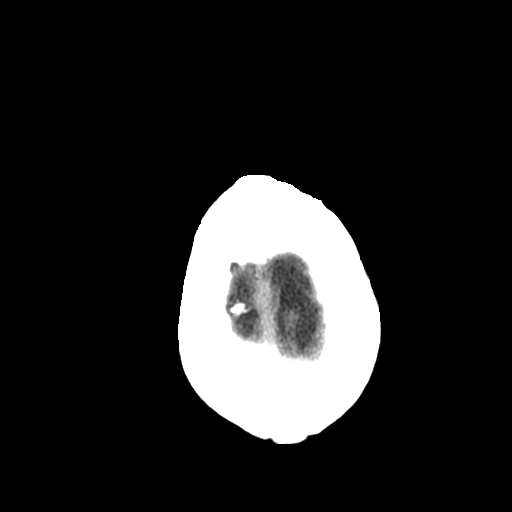

[Series 4: coronal soft tissue · coronal · 0.30mm/px · 3 of 66 slices shown]
[im 22/66  brain]
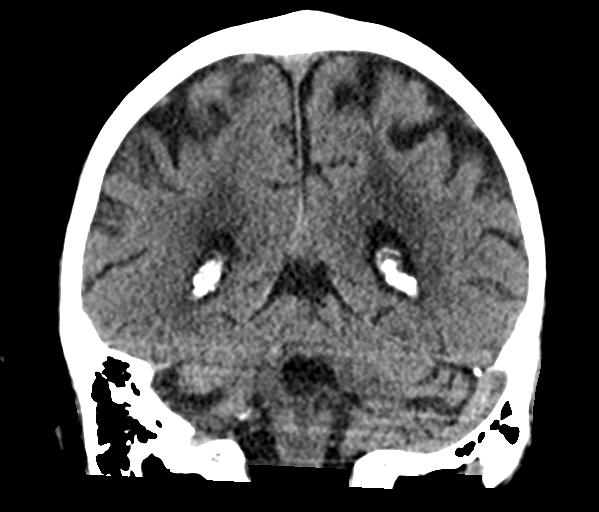
[im 29/66  brain]
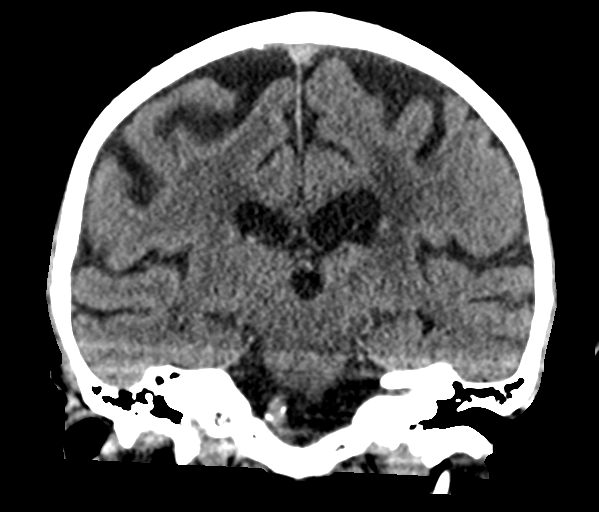
[im 37/66  brain]
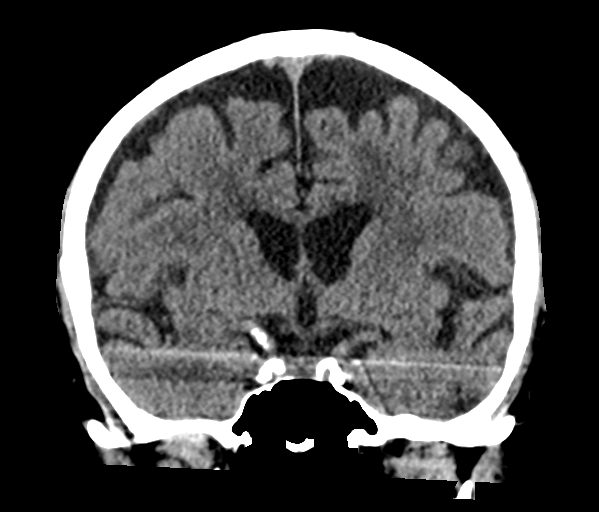

[Series 5: sagittal soft tissue · sagittal · 0.29mm/px · 3 of 60 slices shown]
[im 20/60  brain]
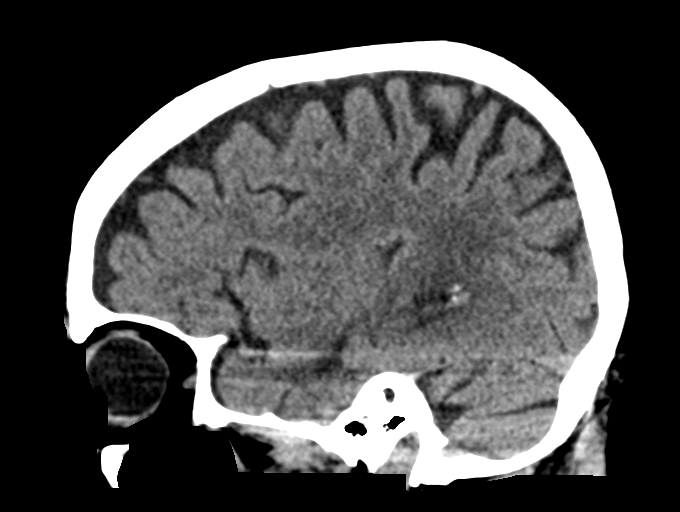
[im 30/60  brain]
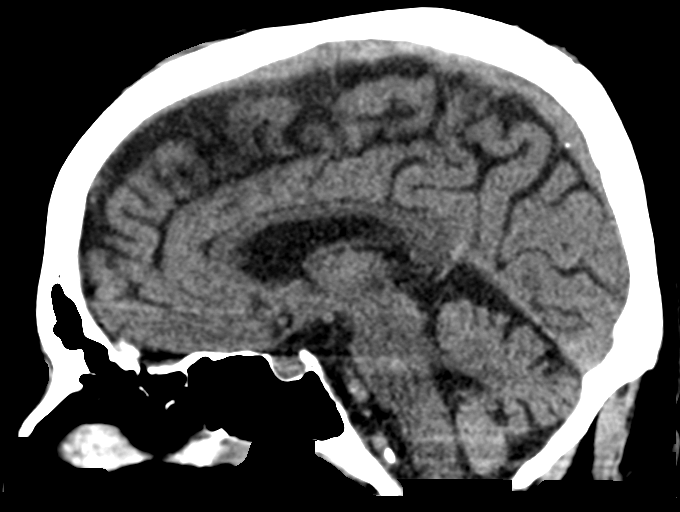
[im 40/60  brain]
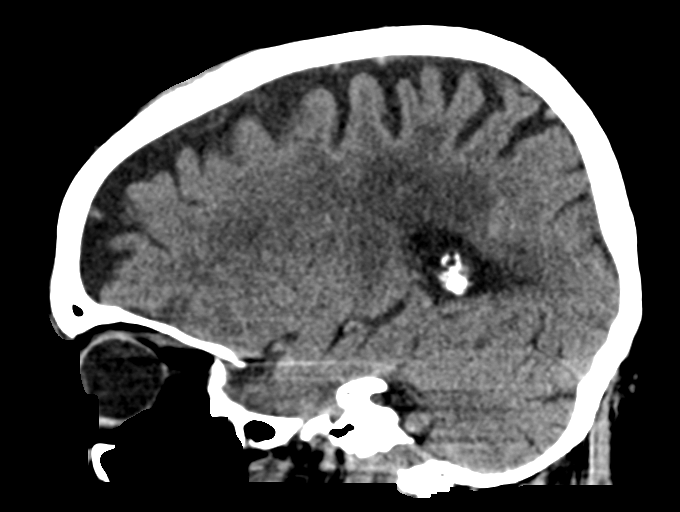

[16 of 47 positions shown; findings below may reference images not displayed]

FINDINGS: Brain: No acute territorial infarction, hemorrhage, or intracranial
mass. Atrophy and moderate chronic small vessel ischemic changes of
the white matter. Chronic lacunar infarct within the left thalamic
capsular region. Stable ventricle size

Vascular: No hyperdense vessels.  Carotid vascular calcification.

Skull: No fracture.

Sinuses/Orbits: No acute finding.

Other: Scalp soft tissue abnormality at the anterior vertex.
IMPRESSION: 1. No CT evidence for acute intracranial abnormality.
2. Atrophy and chronic small vessel ischemic changes of the white
matter. Chronic infarcts in the left thalamic capsular region.
3. Abnormal appearance of scalp soft tissues at the anterior vertex
which may be posttraumatic or due to a scalp/skin lesion, correlate
with direct inspection.

## 2022-02-04 ENCOUNTER — Encounter: Payer: Self-pay | Admitting: Family Medicine

## 2022-02-04 ENCOUNTER — Ambulatory Visit (INDEPENDENT_AMBULATORY_CARE_PROVIDER_SITE_OTHER): Payer: Medicare Other | Admitting: Family Medicine

## 2022-02-04 VITALS — BP 138/80 | HR 72 | Temp 98.1°F | Ht 65.0 in | Wt 161.2 lb

## 2022-02-04 DIAGNOSIS — I6521 Occlusion and stenosis of right carotid artery: Secondary | ICD-10-CM

## 2022-02-04 DIAGNOSIS — M1610 Unilateral primary osteoarthritis, unspecified hip: Secondary | ICD-10-CM | POA: Diagnosis not present

## 2022-02-04 DIAGNOSIS — M5137 Other intervertebral disc degeneration, lumbosacral region: Secondary | ICD-10-CM | POA: Diagnosis not present

## 2022-02-04 DIAGNOSIS — M7062 Trochanteric bursitis, left hip: Secondary | ICD-10-CM | POA: Diagnosis not present

## 2022-02-04 NOTE — Progress Notes (Signed)
Cory Cory [name] T. [name], MD, Cory Cory [name] at [facility] Cory Cory [name] Cory Cory [name], [address]  Phone: [phone]  FAX: [place] Cory [name] - 81 y.o. male  MRN [id number]  Date of Birth: 06/02/1940  Date: 02/04/2022  PCP: Cory Noe, MD  Referral: Cory Noe, MD  Chief Complaint  Patient presents with   Follow-up    Left Hip   Subjective:   Cory Cory [name] is a 81 y.o. very pleasant male patient with Body mass index is 26.83 kg/m. who presents with the following:  The last time he was here in the office felt like he was having an exacerbation of his multilevel degenerative disc disease, he also was having some acute trochanteric bursitis.  I did inject his trochanteric bursa on the last time he was here, and additionally sent him to formal physical therapy to work on back rehab.  At the same time, he also does have some hip osteoarthritis, however at the time it seemed like he had no inflamed intra-articular pathology and no groin pain.  His hip moved quite well without any limitation on clinical exam.  Overall, he does feel like he is doing better, but he is not perfect.  He no longer has any lateral hip pain.  Hip is a bit stiff in the morning.  Pain is not as constant anymore.  PT has been ok.     12/19/2021 Last OV with Cory Loffler, MD  Very pleasant 81 year old gentleman presents with some acute on chronic left-sided hip pain.  He is seeing courteous of my partner Dr. Einar Cory [name].   Previously reviewed the patient's x-ray of his left hip which is consistent with moderate osteoarthritic changes.   He denies groin pain.  He points to the lateral hip as the area of his maximal pain, he also has pain in the posterior buttocks region.  He also has some pain in the left lower back region as well.   He also has a swelling in the left lower lateral distal lower extremity, and he does relate this to wrestling with his dog  about 2 weeks ago.   Started to get some pain in the low back.    B foot numbness   Chronic L LBP, DDD   GTB on the L, inject today   L lowest part of the leg hematoma  Review of Systems is noted in the HPI, as appropriate  Objective:   BP 138/80   Pulse 72   Temp 98.1 F (36.7 C) (Oral)   Ht '5\' 5"'$  (1.651 m)   Wt 161 lb 4 oz (73.1 kg)   SpO2 97%   BMI 26.83 kg/m   GEN: No acute distress; alert,appropriate. PULM: Breathing comfortably in no respiratory distress PSYCH: Normally interactive.    HIP EXAM: SIDE: Bilateral ROM: Abduction, Flexion, Internal and External range of motion: Mildly diminished abduction with a total rotational movement of roughly 30 degrees bilaterally Pain with terminal IROM and EROM: No GTB: NT SLR: NEG Knees: No effusion FABER: NT REVERSE FABER: NT, neg Piriformis: NT at direct palpation Str: flexion: 5/5 abduction: 5/5 adduction: 5/5 Strength testing non-tender   Paraspinous musculature are mildly tender from L4-S1 bilaterally. Full range of motion at the knee, foot, and ankle. Sensation is intact throughout as well as his motor function.  Soft touch and pinprick. Full range of motion at the waist in all directions.  Laboratory and Imaging Data:  Assessment and Plan:     ICD-10-CM   1. DDD (degenerative disc disease), lumbosacral  M51.37     2. Trochanteric bursitis of left hip  M70.62     3. Primary localized osteoarthritis of hip  M16.10      Trochanteric myositis resolved.  Ongoing multilevel degenerative disc disease, with some improvement compared to prior.  Do think that pushing physical therapy would be the best thing for this as well as his intermittent hip pain.  Right now he is not having any anterior groin pain.  Does have a mild limp.  [employer] One speech-to-text software was used for transcription in this dictation.  Possible transcriptional errors can occur using Editor, commissioning.   Signed,  Cory Cory [name].  [name], MD   Outpatient Encounter Medications as of 02/04/2022  Medication Sig   acetaminophen (TYLENOL) 500 MG tablet Take 1,000 mg by mouth at bedtime.   amLODipine (NORVASC) 5 MG tablet TAKE 1 TABLET EVERY DAY   aspirin EC 81 MG tablet Take 1 tablet (81 mg total) by mouth daily. Swallow whole.   atorvastatin (LIPITOR) 40 MG tablet TAKE 1 TABLET EVERY DAY   carvedilol (COREG) 6.25 MG tablet TAKE 1 TABLET TWICE DAILY (STOP CLONIDINE)   clopidogrel (PLAVIX) 75 MG tablet TAKE 1 TABLET EVERY DAY   cycloSPORINE (RESTASIS) 0.05 % ophthalmic emulsion 1 drop 2 (two) times a day.   diclofenac Sodium (VOLTAREN) 1 % GEL Apply 2 g topically as needed.   escitalopram (LEXAPRO) 10 MG tablet Take 10 mg by mouth daily.   famotidine (PEPCID) 20 MG tablet Take 20 mg by mouth at bedtime.   fluticasone (FLONASE) 50 MCG/ACT nasal spray USE 1 SPRAY IN BOTH NOSTRILS DAILY   GEMTESA 75 MG TABS Take 1 tablet by mouth daily.   latanoprost (XALATAN) 0.005 % ophthalmic solution daily.   lisinopril (ZESTRIL) 10 MG tablet Take 10 mg by mouth daily.   melatonin 5 MG TABS Take 5 mg by mouth at bedtime.   metFORMIN (GLUCOPHAGE) 500 MG tablet Take 1 tablet (500 mg total) by mouth daily with breakfast.   montelukast (SINGULAIR) 10 MG tablet TAKE 1 TABLET (10 MG TOTAL) BY MOUTH AT BEDTIME.   nitroGLYCERIN (NITROSTAT) 0.4 MG SL tablet Place 1 tablet (0.4 mg total) under the tongue every 5 (five) minutes as needed.   pantoprazole (PROTONIX) 40 MG tablet Take 1 tablet (40 mg total) by mouth daily. (Patient taking differently: Take 40 mg by mouth 2 (two) times daily.)   tamsulosin (FLOMAX) 0.4 MG CAPS capsule Take 1 capsule (0.4 mg total) by mouth daily.   No facility-administered encounter medications on file as of 02/04/2022.

## 2022-02-12 ENCOUNTER — Ambulatory Visit (INDEPENDENT_AMBULATORY_CARE_PROVIDER_SITE_OTHER): Payer: Medicare Other

## 2022-02-12 VITALS — Ht 65.0 in | Wt 161.0 lb

## 2022-02-12 DIAGNOSIS — Z0000 Encounter for general adult medical examination without abnormal findings: Secondary | ICD-10-CM

## 2022-02-12 NOTE — Progress Notes (Signed)
Subjective:   Cory [name] is a 81 y.o. male who presents for Medicare Annual/Subsequent preventive examination.  Review of Systems    Virtual Visit via Telephone Note  I connected with  Cory [name] on 02/12/22 at  3:45 PM EDT by telephone and verified that I am speaking with the correct person using two identifiers.  Location: Patient: Home Provider: Office Persons participating in the virtual visit: patient/Nurse Health Advisor   I discussed the limitations, risks, security and privacy concerns of performing an evaluation and management service by telephone and the availability of in person appointments. The patient expressed understanding and agreed to proceed.  Interactive audio and video telecommunications were attempted between this nurse and patient, however failed, due to patient having technical difficulties OR patient did not have access to video capability.  We continued and completed visit with audio only.  Some vital signs may be absent or patient reported.   Criselda Peaches, LPN  Cardiac Risk Factors include: advanced age (>20mn, >>52women);hypertension;diabetes mellitus;male gender     Objective:    Today's Vitals   02/12/22 1552  Weight: 161 lb (73 kg)  Height: '5\' 5"'$  (1.651 m)   Body mass index is 26.79 kg/m.     02/12/2022    4:02 PM 10/20/2021    8:15 PM 06/22/2021    6:19 PM 06/13/2021   11:27 AM 04/04/2021    1:39 PM 01/29/2021    3:11 PM 01/19/2021    6:39 AM  Advanced Directives  Does Patient Have a Medical Advance Directive? Yes No No No Yes Yes Yes  Type of AParamedicof AFairbornLiving will    Living will HCascadeLiving will   Does patient want to make changes to medical advance directive?    Yes (MAU/Ambulatory/Procedural Areas - Information given)  Yes (MAU/Ambulatory/Procedural Areas - Information given) No - Patient declined  Copy of [place] Chart? No - copy  requested     No - copy requested   Would patient like information on creating a medical advance directive?  No - Patient declined   No - Patient declined      Current Medications (verified) Outpatient Encounter Medications as of 02/12/2022  Medication Sig   acetaminophen (TYLENOL) 500 MG tablet Take 1,000 mg by mouth at bedtime.   amLODipine (NORVASC) 5 MG tablet TAKE 1 TABLET EVERY DAY   aspirin EC 81 MG tablet Take 1 tablet (81 mg total) by mouth daily. Swallow whole.   atorvastatin (LIPITOR) 40 MG tablet TAKE 1 TABLET EVERY DAY   carvedilol (COREG) 6.25 MG tablet TAKE 1 TABLET TWICE DAILY (STOP CLONIDINE)   clopidogrel (PLAVIX) 75 MG tablet TAKE 1 TABLET EVERY DAY   cycloSPORINE (RESTASIS) 0.05 % ophthalmic emulsion 1 drop 2 (two) times a day.   diclofenac Sodium (VOLTAREN) 1 % GEL Apply 2 g topically as needed.   escitalopram (LEXAPRO) 10 MG tablet Take 10 mg by mouth daily.   famotidine (PEPCID) 20 MG tablet Take 20 mg by mouth at bedtime.   fluticasone (FLONASE) 50 MCG/ACT nasal spray USE 1 SPRAY IN BOTH NOSTRILS DAILY   GEMTESA 75 MG TABS Take 1 tablet by mouth daily.   latanoprost (XALATAN) 0.005 % ophthalmic solution daily.   lisinopril (ZESTRIL) 10 MG tablet Take 10 mg by mouth daily.   melatonin 5 MG TABS Take 5 mg by mouth at bedtime.   metFORMIN (GLUCOPHAGE) 500 MG tablet Take 1  tablet (500 mg total) by mouth daily with breakfast.   montelukast (SINGULAIR) 10 MG tablet TAKE 1 TABLET (10 MG TOTAL) BY MOUTH AT BEDTIME.   nitroGLYCERIN (NITROSTAT) 0.4 MG SL tablet Place 1 tablet (0.4 mg total) under the tongue every 5 (five) minutes as needed.   pantoprazole (PROTONIX) 40 MG tablet Take 1 tablet (40 mg total) by mouth daily. (Patient taking differently: Take 40 mg by mouth 2 (two) times daily.)   tamsulosin (FLOMAX) 0.4 MG CAPS capsule Take 1 capsule (0.4 mg total) by mouth daily.   No facility-administered encounter medications on file as of 02/12/2022.    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   Left scalp - EDC   Stroke ([place])    on Plavix and ASA   Vasovagal syncope    Past Surgical History:  Procedure Laterality Date   BACK SURGERY     CHOLECYSTECTOMY     CORONARY ARTERY BYPASS GRAFT  2000   DEBRIDEMENT AND CLOSURE WOUND N/A 01/19/2021   Procedure: Closure of scalp defect;  Surgeon: Cindra Presume, MD;  Location: [facility];  Service: Plastics;  Laterality: N/A;   MOHS SURGERY     NECK SURGERY     Family History  Problem Relation Age of Onset   Heart Problems Mother    Heart disease Mother    Heart Problems Father    Heart attack Father    Heart disease Brother    Social History   Socioeconomic History   Marital status: Married    Spouse name: Not on file   Number of children: Not on file   Years of education: Not on file   Highest education level: Not on file  Occupational History   Occupation: retired  Tobacco Use   Smoking status: Former    Types: Pipe   Smokeless tobacco: Never  Scientific laboratory technician Use: Never used  Substance and Sexual Activity   Alcohol use: Never   Drug use: Never   Sexual activity: Yes  Other Topics Concern   Not on file  Social History Narrative   Lives with wife   Lives near wife's daughter and grandson   Social Determinants of Health   Financial Resource Strain: [place]  (02/12/2022)   Overall Financial Resource Strain (CARDIA)    Difficulty of Paying Living Expenses: Not hard at all  Food  Insecurity: No Food Insecurity (02/12/2022)   Hunger Vital Sign    Worried About Running Out of Food in the Last Year: Never true    Slaughter in the Last Year: Never true  Transportation Needs: No Transportation Needs (02/12/2022)   PRAPARE - Hydrologist (Medical): No    Lack of Transportation (Non-Medical): No  Physical Activity: Sufficiently Active (02/12/2022)   Exercise Vital Sign    Days of Exercise per Week: 5 days    Minutes of Exercise per Session: 30 min  Stress: No Stress Concern Present (02/12/2022)  [employer] - Occupational Stress Questionnaire    Feeling of Stress : Not at all  Social Connections: Moderately Isolated (02/12/2022)   Social Connection and Isolation Panel [NHANES]    Frequency of Communication with Friends and Family: More than three times a week    Frequency of Social Gatherings with Friends and Family: More than three times a week    Attends Religious Services: Never    Marine scientist or Organizations: No    Attends Music therapist: Never    Marital Status: Married    Tobacco Counseling Counseling given: Not Answered   Clinical Intake:  Pre-visit preparation completed: No  Pain : No/denies pain     BMI - recorded: 26.79 Nutritional Status: BMI 25 -29 Overweight Nutritional Risks: None Diabetes: Yes CBG done?: No Did pt. bring in CBG monitor from home?: No  How often do you need to have someone help you when you read instructions, pamphlets, or other written materials from your doctor or pharmacy?: 1 - Never  Diabetic?  Yes Pre diabetic  Interpreter Needed?: No  Information entered by :: Rolene Arbour LPN   Activities of Daily Living    02/12/2022    3:59 PM 10/20/2021    8:15 PM  In your present state of health, do you have any difficulty performing the following activities:  Hearing? 0 0  Vision? 0 1  Difficulty concentrating or making  decisions? 0 0  Walking or climbing stairs? 0 1  Dressing or bathing? 0 1  Doing errands, shopping? 0 0  Preparing Food and eating ? N   Using the Toilet? N   In the past six months, have you accidently leaked urine? N   Do you have problems with loss of bowel control? N   Managing your Medications? N   Managing your Finances? N   Housekeeping or managing your Housekeeping? N     Patient Care Team: Waunita Schooner, MD as PCP - General (Family Medicine) [place], Peter M, MD as PCP - Cardiology (Cardiology) Hollice Espy, MD as Consulting Physician (Urology)  Indicate any recent Medical Services you may have received from other than [facility] providers in the past year (date may be approximate).     Assessment:   This is a routine wellness examination for Cory [name].  Hearing/Vision screen Hearing Screening - Comments:: Denies hearing difficulties   Vision Screening - Comments:: Wears rx glasses - up to date with routine eye exams with  [place] issues and exercise activities discussed: Current Exercise Habits: Home exercise routine, Type of exercise: walking, Time (Minutes): 30, Frequency (Times/Week): 5, Weekly Exercise (Minutes/Week): 150, Intensity: Mild, Exercise limited by: None identified   Goals Addressed               This Visit's Progress     Patient Stated (pt-stated)        Maintain general health.       Depression Screen    02/12/2022    3:58 PM 06/13/2021   12:04 PM 04/13/2020   11:49 AM  PHQ 2/9 Scores  PHQ - 2 Score 0 0 0    Fall Risk    02/12/2022    4:01 PM 06/13/2021   11:07 AM 01/29/2021    2:52 PM 04/13/2020   10:17 AM  Fall Risk   Falls in the past year? 1 0 0 1  Number falls in past yr: 0 0 0 0  Injury with Fall?  0     Risk for fall due to : No Fall Risks Impaired balance/gait  Impaired balance/gait  Follow up Falls prevention discussed       FALL RISK PREVENTION PERTAINING TO THE HOME:  Any stairs in or around the home?  Yes  If so, are there any without handrails? No  Home free of loose throw rugs in walkways, pet beds, electrical cords, etc? Yes  Adequate lighting in your home to reduce risk of falls? Yes   ASSISTIVE DEVICES UTILIZED TO PREVENT FALLS:  Life alert? No  Use of a cane, walker or w/c? Yes  Grab bars in the bathroom? No  Shower chair or bench in shower? Yes  Elevated toilet seat or a handicapped toilet? Yes   TIMED UP AND GO:  Was the test performed? No . Audio Visit   Cognitive Function:      12/11/2021   12:39 PM  Montreal Cognitive Assessment   Visuospatial/ Executive (0/5) 4  Naming (0/3) 3  Attention: Read list of digits (0/2) 2  Attention: Read list of letters (0/1) 1  Attention: Serial 7 subtraction starting at 100 (0/3) 3  Language: Repeat phrase (0/2) 1  Language : Fluency (0/1) 0  Abstraction (0/2) 2  Delayed Recall (0/5) 3  Orientation (0/6) 6  Total 25      02/12/2022    4:02 PM  6CIT Screen  What Year? 0 points  What month? 0 points  What time? 0 points  Count back from 20 0 points  Months in reverse 0 points  Repeat phrase 0 points  Total Score 0 points    Immunizations Immunization History  Administered Date(s) Administered   DTaP 01/24/2020   Fluad Quad(high Dose 65+) 01/28/2020, 01/29/2021, 01/16/2022   PFIZER(Purple Top)SARS-COV-2 Vaccination 05/25/2019, 06/15/2019   Pneumococcal Conjugate-13 05/10/2013   Pneumococcal Polysaccharide-23 12/03/2005   Td 12/15/2018   Tdap 04/12/2011, 04/04/2021   Zoster Recombinat (Shingrix) 10/17/2017, 12/23/2017    TDAP status: Up to date  Flu Vaccine status: Up to date  Pneumococcal vaccine status: Up to date  Covid-19 vaccine status: Completed vaccines  Qualifies for Shingles Vaccine? Yes   Zostavax completed Yes   Shingrix Completed?: Yes  Screening Tests Health Maintenance  Topic Date Due   OPHTHALMOLOGY EXAM  Never done   Diabetic kidney evaluation - Urine ACR  Never done   HEMOGLOBIN  A1C  10/29/2021   COVID-19 Vaccine (3 - Pfizer risk series) 02/28/2022 (Originally 07/13/2019)   FOOT EXAM  02/22/2022   Diabetic kidney evaluation - GFR measurement  01/26/2023   TETANUS/TDAP  04/05/2031   Pneumonia Vaccine 44+ Years old  Completed   INFLUENZA VACCINE  Completed   Zoster Vaccines- Shingrix  Completed   HPV VACCINES  Aged Out    Health Maintenance  Health Maintenance Due  Topic Date Due   OPHTHALMOLOGY EXAM  Never done   Diabetic kidney evaluation - Urine ACR  Never done   HEMOGLOBIN A1C  10/29/2021    Colorectal cancer screening: No longer required.   Lung Cancer Screening: (Low Dose CT Chest recommended if Age 2-80 years, 30 pack-year currently smoking OR have quit w/in 15years.) does not qualify.     Additional Screening:  Hepatitis C Screening: does not qualify; Completed   Vision Screening: Recommended annual ophthalmology exams for early detection of glaucoma and other disorders of the eye. Is the patient up to date with their annual eye exam?  Yes  Who is the provider or what is  the name of the office in which the patient attends annual eye exams? [facility] If pt is not established with a provider, would they like to be referred to a provider to establish care? No .   Dental Screening: Recommended annual dental exams for proper oral hygiene  Community Resource Referral / Chronic Care Management:  CRR required this visit?  No   CCM required this visit?  No      Plan:     I have personally reviewed and noted the following in the patient's chart:   Medical and social history Use of alcohol, tobacco or illicit drugs  Current medications and supplements including opioid prescriptions. Patient is not currently taking opioid prescriptions. Functional ability and status Nutritional status Physical activity Advanced directives List of other physicians Hospitalizations, surgeries, and ER visits in previous 12 months Vitals Screenings  to include cognitive, depression, and falls Referrals and appointments  In addition, I have reviewed and discussed with patient certain preventive protocols, quality metrics, and best practice recommendations. A written personalized care plan for preventive services as well as general preventive health recommendations were provided to patient.     Criselda Peaches, LPN   [id number]   Nurse Notes: Patient due Diabetic kidney evaluation- Urine ACR and Hemoglobin A1C

## 2022-02-12 NOTE — Patient Instructions (Addendum)
Cory [name] , Thank you for taking time to come for your Medicare Wellness Visit. I appreciate your ongoing commitment to your health goals. Please review the following plan we discussed and let me know if I can assist you in the future.   These are the goals we discussed:  Goals       Exercise 3x per week (30 min per time)      Work on new step at home, walk the dog park      Patient Stated (pt-stated)      Maintain general health.        This is a list of the screening recommended for you and due dates:  Health Maintenance  Topic Date Due   Eye exam for diabetics  Never done   Yearly kidney health urinalysis for diabetes  Never done   Hemoglobin A1C  10/29/2021   COVID-19 Vaccine (3 - Pfizer risk series) 02/28/2022*   Complete foot exam   02/22/2022   Yearly kidney function blood test for diabetes  01/26/2023   Tetanus Vaccine  04/05/2031   Pneumonia Vaccine  Completed   Flu Shot  Completed   Zoster (Shingles) Vaccine  Completed   HPV Vaccine  Aged Out  *Topic was postponed. The date shown is not the original due date.    Advanced directives: Please bring a copy of your health care power of attorney and living will to the office to be added to your chart at your convenience.   Conditions/risks identified: None  Next appointment: Follow up in one year for your annual wellness visit.    Preventive Care 1 Years and Older, Male  Preventive care refers to lifestyle choices and visits with your health care provider that can promote health and wellness. What does preventive care include? A yearly physical exam. This is also called an annual well check. Dental exams once or twice a year. Routine eye exams. Ask your health care provider how often you should have your eyes checked. Personal lifestyle choices, including: Daily care of your teeth and gums. Regular physical activity. Eating a healthy diet. Avoiding tobacco and drug use. Limiting alcohol use. Practicing safe  sex. Taking low doses of aspirin every day. Taking vitamin and mineral supplements as recommended by your health care provider. What happens during an annual well check? The services and screenings done by your health care provider during your annual well check will depend on your age, overall health, lifestyle risk factors, and family history of disease. Counseling  Your health care provider may ask you questions about your: Alcohol use. Tobacco use. Drug use. Emotional well-being. Home and relationship well-being. Sexual activity. Eating habits. History of falls. Memory and ability to understand (cognition). Work and work Statistician. Screening  You may have the following tests or measurements: Height, weight, and BMI. Blood pressure. Lipid and cholesterol levels. These may be checked every 5 years, or more frequently if you are over 37 years old. Skin check. Lung cancer screening. You may have this screening every year starting at age 69 if you have a 30-pack-year history of smoking and currently smoke or have quit within the past 15 years. Fecal occult blood test (FOBT) of the stool. You may have this test every year starting at age 1. Flexible sigmoidoscopy or colonoscopy. You may have a sigmoidoscopy every 5 years or a colonoscopy every 10 years starting at age 77. Prostate cancer screening. Recommendations will vary depending on your family history and other risks. Hepatitis C  blood test. Hepatitis B blood test. Sexually transmitted disease (STD) testing. Diabetes screening. This is done by checking your blood sugar (glucose) after you have not eaten for a while (fasting). You may have this done every 1-3 years. Abdominal aortic aneurysm (AAA) screening. You may need this if you are a current or former smoker. Osteoporosis. You may be screened starting at age 39 if you are at high risk. Talk with your health care provider about your test results, treatment options, and if  necessary, the need for more tests. Vaccines  Your health care provider may recommend certain vaccines, such as: Influenza vaccine. This is recommended every year. Tetanus, diphtheria, and acellular pertussis (Tdap, Td) vaccine. You may need a Td booster every 10 years. Zoster vaccine. You may need this after age 85. Pneumococcal 13-valent conjugate (PCV13) vaccine. One dose is recommended after age 69. Pneumococcal polysaccharide (PPSV23) vaccine. One dose is recommended after age 69. Talk to your health care provider about which screenings and vaccines you need and how often you need them. This information is not intended to replace advice given to you by your health care provider. Make sure you discuss any questions you have with your health care provider. Document Released: 05/12/2015 Document Revised: 01/03/2016 Document Reviewed: 02/14/2015 Elsevier Interactive Patient Education  2017 [employer] Prevention in the Home Falls can cause injuries. They can happen to people of all ages. There are many things you can do to make your home safe and to help prevent falls. What can I do on the outside of my home? Regularly fix the edges of walkways and driveways and fix any cracks. Remove anything that might make you trip as you walk through a door, such as a raised step or threshold. Trim any bushes or trees on the path to your home. Use bright outdoor lighting. Clear any walking paths of anything that might make someone trip, such as rocks or tools. Regularly check to see if handrails are loose or broken. Make sure that both sides of any steps have handrails. Any raised decks and porches should have guardrails on the edges. Have any leaves, snow, or ice cleared regularly. Use sand or salt on walking paths during winter. Clean up any spills in your garage right away. This includes oil or grease spills. What can I do in the bathroom? Use night lights. Install grab bars by the toilet  and in the tub and shower. Do not use towel bars as grab bars. Use non-skid mats or decals in the tub or shower. If you need to sit down in the shower, use a plastic, non-slip stool. Keep the floor dry. Clean up any water that spills on the floor as soon as it happens. Remove soap buildup in the tub or shower regularly. Attach bath mats securely with double-sided non-slip rug tape. Do not have throw rugs and other things on the floor that can make you trip. What can I do in the bedroom? Use night lights. Make sure that you have a light by your bed that is easy to reach. Do not use any sheets or blankets that are too big for your bed. They should not hang down onto the floor. Have a firm chair that has side arms. You can use this for support while you get dressed. Do not have throw rugs and other things on the floor that can make you trip. What can I do in the kitchen? Clean up any spills right away. Avoid walking on  wet floors. Keep items that you use a lot in easy-to-reach places. If you need to reach something above you, use a strong step stool that has a grab bar. Keep electrical cords out of the way. Do not use floor polish or wax that makes floors slippery. If you must use wax, use non-skid floor wax. Do not have throw rugs and other things on the floor that can make you trip. What can I do with my stairs? Do not leave any items on the stairs. Make sure that there are handrails on both sides of the stairs and use them. Fix handrails that are broken or loose. Make sure that handrails are as long as the stairways. Check any carpeting to make sure that it is firmly attached to the stairs. Fix any carpet that is loose or worn. Avoid having throw rugs at the top or bottom of the stairs. If you do have throw rugs, attach them to the floor with carpet tape. Make sure that you have a light switch at the top of the stairs and the bottom of the stairs. If you do not have them, ask someone to add  them for you. What else can I do to help prevent falls? Wear shoes that: Do not have high heels. Have rubber bottoms. Are comfortable and fit you well. Are closed at the toe. Do not wear sandals. If you use a stepladder: Make sure that it is fully opened. Do not climb a closed stepladder. Make sure that both sides of the stepladder are locked into place. Ask someone to hold it for you, if possible. Clearly mark and make sure that you can see: Any grab bars or handrails. First and last steps. Where the edge of each step is. Use tools that help you move around (mobility aids) if they are needed. These include: Canes. Walkers. Scooters. Crutches. Turn on the lights when you go into a dark area. Replace any light bulbs as soon as they burn out. Set up your furniture so you have a clear path. Avoid moving your furniture around. If any of your floors are uneven, fix them. If there are any pets around you, be aware of where they are. Review your medicines with your doctor. Some medicines can make you feel dizzy. This can increase your chance of falling. Ask your doctor what other things that you can do to help prevent falls. This information is not intended to replace advice given to you by your health care provider. Make sure you discuss any questions you have with your health care provider. Document Released: 02/09/2009 Document Revised: 09/21/2015 Document Reviewed: 05/20/2014 Elsevier Interactive Patient Education  2017 [employer].

## 2022-02-13 ENCOUNTER — Ambulatory Visit (INDEPENDENT_AMBULATORY_CARE_PROVIDER_SITE_OTHER): Payer: Medicare Other | Admitting: Dermatology

## 2022-02-13 DIAGNOSIS — L578 Other skin changes due to chronic exposure to nonionizing radiation: Secondary | ICD-10-CM | POA: Diagnosis not present

## 2022-02-13 DIAGNOSIS — Z5111 Encounter for antineoplastic chemotherapy: Secondary | ICD-10-CM | POA: Diagnosis not present

## 2022-02-13 DIAGNOSIS — L859 Epidermal thickening, unspecified: Secondary | ICD-10-CM | POA: Diagnosis not present

## 2022-02-13 DIAGNOSIS — Z79899 Other long term (current) drug therapy: Secondary | ICD-10-CM

## 2022-02-13 DIAGNOSIS — L570 Actinic keratosis: Secondary | ICD-10-CM

## 2022-02-13 DIAGNOSIS — I6521 Occlusion and stenosis of right carotid artery: Secondary | ICD-10-CM | POA: Diagnosis not present

## 2022-02-13 MED ORDER — FLUOROURACIL 5 % EX CREA: TOPICAL_CREAM | Freq: Two times a day (BID) | CUTANEOUS | 1 refills | Status: DC

## 2022-02-13 NOTE — Progress Notes (Signed)
Follow-Up Visit   Subjective  Cory [name] is a 81 y.o. male who presents for the following: Actinic Keratosis (Face, scalp, 6wk f/u/) and Hyperkeratosis  (Scalp, 6wk f/u, pt did not restart Urea cream).  The following portions of the chart were reviewed this encounter and updated as appropriate:   Tobacco  Allergies  Meds  Problems  Med Hx  Surg Hx  Fam Hx     Review of Systems:  No other skin or systemic complaints except as noted in HPI or Assessment and Plan.  Objective  Well appearing patient in no apparent distress; mood and affect are within normal limits.  A focused examination was performed including face, scalp. Relevant physical exam findings are noted in the Assessment and Plan.  Scalp x 4 (4) Pink scaly macules  L Scalp Hyperkeratosis and scale   Assessment & Plan   Actinic Damage - Severe, confluent actinic changes with pre-cancerous actinic keratoses  - Severe, chronic, not at goal, secondary to cumulative UV radiation exposure over time - diffuse scaly erythematous macules and papules with underlying dyspigmentation - Discussed Prescription "Field Treatment" for Severe, Chronic Confluent Actinic Changes with Pre-Cancerous Actinic Keratoses Field treatment involves treatment of an entire area of skin that has confluent Actinic Changes (Sun/ Ultraviolet light damage) and PreCancerous Actinic Keratoses by method of PhotoDynamic Therapy (PDT) and/or prescription Topical Chemotherapy agents such as 5-fluorouracil, 5-fluorouracil/calcipotriene, and/or imiquimod.  The purpose is to decrease the number of clinically evident and subclinical PreCancerous lesions to prevent progression to development of skin cancer by chemically destroying early precancer changes that may or may not be visible.  It has been shown to reduce the risk of developing skin cancer in the treated area. As a result of treatment, redness, scaling, crusting, and open sores may occur during  treatment course. One or more than one of these methods may be used and may have to be used several times to control, suppress and eliminate the PreCancerous changes. Discussed treatment course, expected reaction, and possible side effects. - Recommend daily broad spectrum sunscreen SPF 30+ to sun-exposed areas, reapply every 2 hours as needed.  - Staying in the shade or wearing long sleeves, sun glasses (UVA+UVB protection) and wide brim hats (4-inch brim around the entire circumference of the hat) are also recommended. - Call for new or changing lesions.  -- Start 5-fluorouracil/calcipotriene cream twice a day for 7 days to affected areas including nose and temples. Prescription sent to [facility]. Patient advised they will receive an email to purchase the medication online and have it sent to their home. Patient provided with handout reviewing treatment course and side effects and advised to call or message [place] on MyChart with any concerns.  Reviewed course of treatment and expected reaction.  Patient advised to expect inflammation and crusting and advised that erosions are possible.  Patient advised to be diligent with sun protection during and after treatment. Counseled to keep medication out of reach of children and pets.   AK (actinic keratosis) (4) Scalp x 4  Destruction of lesion - Scalp x 4 Complexity: simple   Destruction method: cryotherapy   Informed consent: discussed and consent obtained   Timeout:  patient name, date of birth, surgical site, and procedure verified Lesion destroyed using liquid nitrogen: Yes   Region frozen until ice ball extended beyond lesion: Yes   Outcome: patient tolerated procedure well with no complications   Post-procedure details: wound care instructions given  fluorouracil (EFUDEX) 5 % cream - Scalp x 4 Apply topically 2 (two) times daily. Bid for 7 days to nose and bil temples  Hyperkeratosis L Scalp  Recommend  restarting Urea cream qd to aal   Return in about 3 months (around 05/16/2022) for AK f/u.  I, Othelia Pulling, RMA, am acting as scribe for Sarina Ser, MD . Documentation: I have reviewed the above documentation for accuracy and completeness, and I agree with the above.  Sarina Ser, MD

## 2022-02-13 NOTE — Patient Instructions (Addendum)
Start 5-fluorouracil/calcipotriene cream twice a day for 7 days to affected areas including nose and temples. Prescription sent to [facility]. Patient advised they will receive an email to purchase the medication online and have it sent to their home. Patient provided with handout reviewing treatment course and side effects and advised to call or message [place] on MyChart with any concerns.    Instructions for [facility] Medications  One or more of your medications was sent to the [facility]. You will receive an email from them and can purchase the medicine through that link. It will then be mailed to your home at the address you confirmed. If for any reason you do not receive an email from them, please check your spam folder. If you still do not find the email, please let us know. [facility] phone number is [phone].   5-Fluorouracil/Calcipotriene Patient Education   Actinic keratoses are the dry, red scaly spots on the skin caused by sun damage. A portion of these spots can turn into skin cancer with time, and treating them can help prevent development of skin cancer.   Treatment of these spots requires removal of the defective skin cells. There are various ways to remove actinic keratoses, including freezing with liquid nitrogen, treatment with creams, or treatment with a blue light procedure in the office.   5-fluorouracil cream is a topical cream used to treat actinic keratoses. It works by interfering with the growth of abnormal fast-growing skin cells, such as actinic keratoses. These cells peel off and are replaced by healthy ones.   5-fluorouracil/calcipotriene is a combination of the 5-fluorouracil cream with a vitamin D analog cream called calcipotriene. The calcipotriene alone does not treat actinic keratoses. However, when it is combined with 5-fluorouracil, it helps the 5-fluorouracil treat the actinic keratoses  much faster so that the same results can be achieved with a much shorter treatment time.  INSTRUCTIONS FOR 5-FLUOROURACIL/CALCIPOTRIENE CREAM:   5-fluorouracil/calcipotriene cream typically only needs to be used for 4-7 days. A thin layer should be applied twice a day to the treatment areas recommended by your physician.   If your physician prescribed you separate tubes of 5-fluourouracil and calcipotriene, apply a thin layer of 5-fluorouracil followed by a thin layer of calcipotriene.   Avoid contact with your eyes, nostrils, and mouth. Do not use 5-fluorouracil/calcipotriene cream on infected or open wounds.   You will develop redness, irritation and some crusting at areas where you have pre-cancer damage/actinic keratoses. IF YOU DEVELOP PAIN, BLEEDING, OR SIGNIFICANT CRUSTING, STOP THE TREATMENT EARLY - you have already gotten a good response and the actinic keratoses should clear up well.  Wash your hands after applying 5-fluorouracil 5% cream on your skin.   A moisturizer or sunscreen with a minimum SPF 30 should be applied each morning.   Once you have finished the treatment, you can apply a thin layer of Vaseline twice a day to irritated areas to soothe and calm the areas more quickly. If you experience significant discomfort, contact your physician.  For some patients it is necessary to repeat the treatment for best results.  SIDE EFFECTS: When using 5-fluorouracil/calcipotriene cream, you may have mild irritation, such as redness, dryness, swelling, or a mild burning sensation. This usually resolves within 2 weeks. The more actinic keratoses you have, the more redness and inflammation you can expect during treatment. Eye irritation has been reported rarely. If this occurs, please let us know.  If you have  any trouble using this cream, please call the office. If you have any other questions about this information, please do not hesitate to ask me before you leave the  office.   Restart Urea cream to area on Left scalp once daily  Due to recent changes in healthcare laws, you may see results of your pathology and/or laboratory studies on MyChart before the doctors have had a chance to review them. We understand that in some cases there may be results that are confusing or concerning to you. Please understand that not all results are received at the same time and often the doctors may need to interpret multiple results in order to provide you with the best plan of care or course of treatment. Therefore, we ask that you please give [place] 2 business days to thoroughly review all your results before contacting the office for clarification. Should we see a critical lab result, you will be contacted sooner.   If You Need Anything After Your Visit  If you have any questions or concerns for your doctor, please call our main line at [phone] and press option 4 to reach your doctor's medical assistant. If no one answers, please leave a voicemail as directed and we will return your call as soon as possible. Messages left after 4 pm will be answered the following business day.   You may also send [place] a message via [place]. We typically respond to MyChart messages within 1-2 business days.  For prescription refills, please ask your pharmacy to contact our office. Our fax number is [phone].  If you have an urgent issue when the clinic is closed that cannot wait until the next business day, you can page your doctor at the number below.    Please note that while we do our best to be available for urgent issues outside of office hours, we are not available 24/7.   If you have an urgent issue and are unable to reach [place], you may choose to seek medical care at your doctor's office, retail clinic, urgent care center, or emergency room.  If you have a medical emergency, please immediately call 911 or go to the emergency department.  Pager Numbers  - Dr. Nehemiah Massed:  [phone]  - Dr. Laurence Ferrari: [phone]  - Dr. Nicole Kindred: [phone]  In the event of inclement weather, please call our main line at [phone] for an update on the status of any delays or closures.  Dermatology Medication Tips: Please keep the boxes that topical medications come in in order to help keep track of the instructions about where and how to use these. Pharmacies typically print the medication instructions only on the boxes and not directly on the medication tubes.   If your medication is too expensive, please contact our office at [phone] option 4 or send [place] a message through [place].   We are unable to tell what your co-pay for medications will be in advance as this is different depending on your insurance coverage. However, we may be able to find a substitute medication at lower cost or fill out paperwork to get insurance to cover a needed medication.   If a prior authorization is required to get your medication covered by your insurance company, please allow [place] 1-2 business days to complete this process.  Drug prices often vary depending on where the prescription is filled and some pharmacies may offer cheaper prices.  The website www.goodrx.com contains coupons for medications through different pharmacies. The prices here do not account  for what the cost may be with help from insurance (it may be cheaper with your insurance), but the website can give you the price if you did not use any insurance.  - You can print the associated coupon and take it with your prescription to the pharmacy.  - You may also stop by our office during regular business hours and pick up a GoodRx coupon card.  - If you need your prescription sent electronically to a different pharmacy, notify our office through [facility] or by phone at [phone] option 4.     Si Usted Necesita Algo Despus de Su Visita  Tambin puede enviarnos un mensaje a travs de Pharmacist, community. Por lo general  respondemos a los mensajes de MyChart en el transcurso de 1 a 2 das hbiles.  Para renovar recetas, por favor pida a su farmacia que se ponga en contacto con nuestra oficina. Harland Dingwall de fax es [place] [phone].  Si tiene un asunto urgente cuando la clnica est cerrada y que no puede esperar hasta el siguiente da hbil, puede llamar/localizar a su doctor(a) al nmero que aparece a continuacin.   Por favor, tenga en cuenta que aunque hacemos todo lo posible para estar disponibles para asuntos urgentes fuera del horario de [place], no estamos disponibles las 24 horas del da, los 7 das de la [place].   Si tiene un problema urgente y no puede comunicarse con nosotros, puede optar por buscar atencin mdica  en el consultorio de su doctor(a), en una clnica privada, en un centro de atencin urgente o en una sala de emergencias.  Si tiene Engineering geologist, por favor llame inmediatamente al 911 o vaya a la sala de emergencias.  Nmeros de bper  - Dr. Nehemiah Massed: [phone]  - Dra. Moye: [phone]  - Dra. Nicole Kindred: [phone]  En caso de inclemencias del [place], por favor llame a Johnsie Kindred principal al [phone] para una actualizacin sobre el [place] de cualquier retraso o cierre.  Consejos para la medicacin en dermatologa: Por favor, guarde las cajas en las que vienen los medicamentos de uso tpico para ayudarle a seguir las instrucciones sobre dnde y cmo usarlos. Las farmacias generalmente imprimen las instrucciones del medicamento slo en las cajas y no directamente en los tubos del [place].   Si su medicamento es muy caro, por favor, pngase en contacto con Zigmund Daniel llamando al [phone] y presione la opcin 4 o envenos un mensaje a travs de Pharmacist, community.   No podemos decirle cul ser su copago por los medicamentos por adelantado ya que esto es diferente dependiendo de la cobertura de su seguro. Sin embargo, es posible que podamos encontrar un  medicamento sustituto a Electrical engineer un formulario para que el seguro cubra el medicamento que se considera necesario.   Si se requiere una autorizacin previa para que su compaa de seguros [place] su medicamento, por favor permtanos de 1 a 2 das hbiles para completar este proceso.  Los precios de los medicamentos varan con frecuencia dependiendo del Environmental consultant de dnde se surte la receta y alguna farmacias pueden ofrecer precios ms baratos.  El sitio web www.goodrx.com tiene cupones para medicamentos de Airline pilot. Los precios aqu no tienen en cuenta lo que podra costar con la ayuda del seguro (puede ser ms barato con su seguro), pero el sitio web puede darle el precio si no utiliz Research scientist (physical sciences).  - Puede imprimir el cupn correspondiente y llevarlo con su receta a la farmacia.  Lavera Guise  puede pasar por nuestra oficina durante el horario de atencin regular y Charity fundraiser una tarjeta de cupones de GoodRx.  - Si necesita que su receta se enve electrnicamente a una farmacia diferente, informe a nuestra oficina a travs de MyChart de [place] o por telfono llamando al [phone] y presione la opcin 4.

## 2022-02-18 ENCOUNTER — Encounter: Payer: Self-pay | Admitting: Family Medicine

## 2022-02-18 ENCOUNTER — Ambulatory Visit (INDEPENDENT_AMBULATORY_CARE_PROVIDER_SITE_OTHER): Payer: Medicare Other | Admitting: Family Medicine

## 2022-02-18 VITALS — BP 138/70 | HR 64 | Temp 97.7°F | Ht 65.0 in | Wt 159.4 lb

## 2022-02-18 DIAGNOSIS — M1610 Unilateral primary osteoarthritis, unspecified hip: Secondary | ICD-10-CM

## 2022-02-18 DIAGNOSIS — M5137 Other intervertebral disc degeneration, lumbosacral region: Secondary | ICD-10-CM | POA: Diagnosis not present

## 2022-02-18 DIAGNOSIS — M5416 Radiculopathy, lumbar region: Secondary | ICD-10-CM | POA: Diagnosis not present

## 2022-02-18 DIAGNOSIS — I6521 Occlusion and stenosis of right carotid artery: Secondary | ICD-10-CM | POA: Diagnosis not present

## 2022-02-18 DIAGNOSIS — M5116 Intervertebral disc disorders with radiculopathy, lumbar region: Secondary | ICD-10-CM | POA: Diagnosis not present

## 2022-02-18 MED ORDER — KETOROLAC TROMETHAMINE 60 MG/2ML IM SOLN
60.0000 mg | Freq: Once | INTRAMUSCULAR | Status: AC
  Administered 2022-02-18: 60 mg via INTRAMUSCULAR

## 2022-02-18 MED ORDER — ACETAMINOPHEN-CODEINE 300-30 MG PO TABS: 1.0000 | ORAL_TABLET | ORAL | 0 refills | Status: DC | PRN

## 2022-02-18 MED ORDER — DEXAMETHASONE SODIUM PHOSPHATE 100 MG/10ML IJ SOLN
10.0000 mg | Freq: Once | INTRAMUSCULAR | Status: AC
  Administered 2022-02-18: 10 mg via INTRAMUSCULAR

## 2022-02-18 MED ORDER — PREDNISONE 20 MG PO TABS: ORAL_TABLET | ORAL | 0 refills | Status: DC

## 2022-02-18 NOTE — Progress Notes (Addendum)
Cory Fonseca T. Javed Cotto, MD, Cory [name] at [facility] [address] [place], [address]  Phone: [phone]  FAX: Barahona [name] - 81 y.o. male  MRN [id number]  Date of Birth: [birth year]  Date: 02/18/2022  PCP: Michela Pitcher, NP  Referral: Waunita Schooner, MD  Chief Complaint  Patient presents with  . Hip Pain    Left Hip  . Back Pain   Subjective:   Cory [name] is a 81 y.o. very pleasant male patient with Body mass index is 26.52 kg/m. who presents with the following:  Mostly up in the back, and he cannot really stand-up all that well.   Has had epidurals in the past.   Has never had back surgery.   Went to the pain clinic in [place], [place], did do epidurals in the past.   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  I have seen him a few times now, and he has had intermittent, severe back pain in various times.  The last time I saw him he had been doing physical therapy, and seemed like he had been doing better.  He is also worried about his hip, but he has no groin pain and does not really endorse any hip pain.  Does have pain in the posterior pelvis region and in the low back.  He is not having any radicular pain right now.  Severe DDD exacerbation  Toradol 60  Decadron 10  Prednisone 14 days  Review of Systems is noted in the HPI, as appropriate  Objective:   BP 138/70   Pulse 64   Temp 97.7 F (36.5 C) (Oral)   Ht '5\' 5"'$  (1.651 m)   Wt 159 lb 6 oz (72.3 kg)   SpO2 98%   BMI 26.52 kg/m   GEN: No acute distress; alert,appropriate. PULM: Breathing comfortably in no respiratory distress PSYCH: Normally interactive.   Hip has roughly 25% loss of motion compared to expected, but he has no pain with terminal abduction or with internal and external rotation with the hip flexed to 90  degrees.  Does have tenderness from L1-S1 bilaterally.  He is tender in the pump posterior but upper pelvis.  No significant lateral pain.  The remainder of the spine exam is entirely normal with normal sensation, normal strength, and he is entirely neurovascularly intact throughout.  Laboratory and Imaging Data:  Assessment and Plan:     ICD-10-CM   1. Lumbar radiculopathy, acute  M54.16 MR Lumbar Spine Wo Contrast    Ambulatory referral to Physical Medicine Rehab    2. DDD (degenerative disc disease), lumbosacral  M51.37 dexamethasone (DECADRON) injection 10 mg    ketorolac (TORADOL) injection 60 mg    MR Lumbar Spine Wo Contrast    Ambulatory referral to Physical Medicine Rehab    3. Primary localized osteoarthritis of hip  M16.10 dexamethasone (DECADRON) injection 10 mg    ketorolac (TORADOL) injection 60 mg    4. Lumbar disc herniation with radiculopathy  M51.16 Ambulatory referral to Physical Medicine Rehab     Acute on chronic back pain with lumbosacral degenerative disc disease, multilevel and with exacerbation.  Patient is in some relatively severe pain today, so I will give him 60 mg of Toradol in the office as well as 10 mg of Decadron.  Following this, I am also going to give him  some oral prednisone for the next 14 days.  If symptoms persist, we may need to get an MRI of his spine and have him see one of the interventional spine doctors.  I do not believe that this is from intra-articular hip pathology.  Addendum: 03/18/22 12:39 PM  The patient has contacted again, and he continues to do poorly with severe radicular pain.  He has failed formal physical therapy, multiple rounds of steroids, NSAIDs, muscle relaxants, and all manner of conservative care.  Continues to do poorly with several months worth of symptoms.    CT of the chest abdomen and pelvis on October 20, 2021 shows multilevel degenerative disc disease throughout the lumbar spine.  Obtain an MRI of the lumbar  spine without contrast to evaluate for herniated disc, spinal stenosis, foraminal stenosis.  Addendum: 03/18/22 12:39 PM  MRI results are as below with Lumbar radiculopathy and multi-level DDD and various amounts of disk herniation thoughout essentially the entire lumbar spine.  I think that an interventional consult would make sense, so I will refer him to physical medicine and rehab.   Medication Management during today's office visit: Meds ordered this encounter  Medications  . predniSONE (DELTASONE) 20 MG tablet    Sig: 2 tabs po for 7 days, then 1 tab po for 7 days    Dispense:  21 tablet    Refill:  0  . acetaminophen-codeine (TYLENOL #3) 300-30 MG tablet    Sig: Take 1 tablet by mouth every 4 (four) hours as needed for moderate pain.    Dispense:  20 tablet    Refill:  0  . dexamethasone (DECADRON) injection 10 mg  . ketorolac (TORADOL) injection 60 mg   Medications Discontinued During This Encounter  Medication Cory  . lisinopril (ZESTRIL) 10 MG tablet Change in therapy    Orders placed today for conditions managed today: Orders Placed This Encounter  Procedures  . MR Lumbar Spine Wo Contrast  . Ambulatory referral to Physical Medicine Rehab    Disposition: No follow-ups on file.  Dragon Medical One speech-to-text software was used for transcription in this dictation.  Possible transcriptional errors can occur using Editor, commissioning.   Signed,  [name]. Leanndra Pember, MD   Outpatient Encounter Medications as of 02/18/2022  Medication Sig  . acetaminophen (TYLENOL) 500 MG tablet Take 1,000 mg by mouth at bedtime.  Marland Kitchen acetaminophen-codeine (TYLENOL #3) 300-30 MG tablet Take 1 tablet by mouth every 4 (four) hours as needed for moderate pain.  Marland Kitchen amLODipine (NORVASC) 5 MG tablet TAKE 1 TABLET EVERY DAY  . aspirin EC 81 MG tablet Take 1 tablet (81 mg total) by mouth daily. Swallow whole.  Marland Kitchen atorvastatin (LIPITOR) 40 MG tablet TAKE 1 TABLET EVERY DAY  . carvedilol (COREG)  6.25 MG tablet TAKE 1 TABLET TWICE DAILY (STOP CLONIDINE)  . clopidogrel (PLAVIX) 75 MG tablet TAKE 1 TABLET EVERY DAY  . cycloSPORINE (RESTASIS) 0.05 % ophthalmic emulsion 1 drop 2 (two) times a day.  . diclofenac Sodium (VOLTAREN) 1 % GEL Apply 2 g topically as needed.  Marland Kitchen escitalopram (LEXAPRO) 10 MG tablet Take 10 mg by mouth daily.  . famotidine (PEPCID) 20 MG tablet Take 20 mg by mouth at bedtime.  . fluorouracil (EFUDEX) 5 % cream Apply topically 2 (two) times daily. Bid for 7 days to nose and bil temples  . fluticasone (FLONASE) 50 MCG/ACT nasal spray USE 1 SPRAY IN BOTH NOSTRILS DAILY  . GEMTESA 75 MG TABS Take 1 tablet by  mouth daily.  Marland Kitchen latanoprost (XALATAN) 0.005 % ophthalmic solution daily.  Marland Kitchen lisinopril (ZESTRIL) 20 MG tablet Take 20 mg by mouth daily.  . melatonin 5 MG TABS Take 5 mg by mouth at bedtime.  . metFORMIN (GLUCOPHAGE) 500 MG tablet Take 1 tablet (500 mg total) by mouth daily with breakfast.  . montelukast (SINGULAIR) 10 MG tablet TAKE 1 TABLET (10 MG TOTAL) BY MOUTH AT BEDTIME.  . nitroGLYCERIN (NITROSTAT) 0.4 MG SL tablet Place 1 tablet (0.4 mg total) under the tongue every 5 (five) minutes as needed.  . pantoprazole (PROTONIX) 40 MG tablet Take 1 tablet (40 mg total) by mouth daily. (Patient taking differently: Take 40 mg by mouth 2 (two) times daily.)  . predniSONE (DELTASONE) 20 MG tablet 2 tabs po for 7 days, then 1 tab po for 7 days  . tamsulosin (FLOMAX) 0.4 MG CAPS capsule Take 1 capsule (0.4 mg total) by mouth daily.  . [DISCONTINUED] lisinopril (ZESTRIL) 10 MG tablet Take 10 mg by mouth daily.  . [EXPIRED] dexamethasone (DECADRON) injection 10 mg   . [EXPIRED] ketorolac (TORADOL) injection 60 mg    No facility-administered encounter medications on file as of 02/18/2022.

## 2022-02-19 ENCOUNTER — Encounter: Payer: Self-pay | Admitting: Family Medicine

## 2022-02-23 ENCOUNTER — Encounter: Payer: Self-pay | Admitting: Dermatology

## 2022-02-25 ENCOUNTER — Encounter: Payer: Self-pay | Admitting: Nurse Practitioner

## 2022-02-25 ENCOUNTER — Ambulatory Visit (INDEPENDENT_AMBULATORY_CARE_PROVIDER_SITE_OTHER): Payer: Medicare Other | Admitting: Nurse Practitioner

## 2022-02-25 VITALS — BP 132/68 | HR 66 | Temp 96.5°F | Resp 14 | Ht 65.0 in | Wt 160.2 lb

## 2022-02-25 DIAGNOSIS — I10 Essential (primary) hypertension: Secondary | ICD-10-CM

## 2022-02-25 DIAGNOSIS — E1165 Type 2 diabetes mellitus with hyperglycemia: Secondary | ICD-10-CM | POA: Diagnosis not present

## 2022-02-25 DIAGNOSIS — R079 Chest pain, unspecified: Secondary | ICD-10-CM

## 2022-02-25 DIAGNOSIS — M5137 Other intervertebral disc degeneration, lumbosacral region: Secondary | ICD-10-CM | POA: Diagnosis not present

## 2022-02-25 DIAGNOSIS — I6930 Unspecified sequelae of cerebral infarction: Secondary | ICD-10-CM

## 2022-02-25 DIAGNOSIS — R42 Dizziness and giddiness: Secondary | ICD-10-CM | POA: Diagnosis not present

## 2022-02-25 DIAGNOSIS — K22719 Barrett's esophagus with dysplasia, unspecified: Secondary | ICD-10-CM

## 2022-02-25 DIAGNOSIS — E871 Hypo-osmolality and hyponatremia: Secondary | ICD-10-CM

## 2022-02-25 LAB — POCT GLYCOSYLATED HEMOGLOBIN (HGB A1C): Hemoglobin A1C: 6.3 % — AB (ref 4.0–5.6)

## 2022-02-25 NOTE — Assessment & Plan Note (Signed)
Orthostatic vital signs were positive in office.  Encourage patient to drink an electrolyte drink a day.  Ending lab results.  Likely will remove one of his blood pressure medicines.  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

## 2022-02-25 NOTE — Assessment & Plan Note (Addendum)
Patient currently maintained on lisinopril 20, amlodipine 5, carvedilol.  Patient was orthostatic in office.  Pending lab results.  May need to back down on some of his medications if.  If we do decide to remove any agent likely amlodipine 5 mg as patient is experiencing bilateral lower extremity edema.

## 2022-02-25 NOTE — Assessment & Plan Note (Signed)
Patient currently being evaluated by Dr. Frederico Hamman Copland.  Continue taking medication as prescribed follow-up with him as recommended or if no improvement.

## 2022-02-25 NOTE — Progress Notes (Signed)
Established Patient Office Visit  Subjective   Patient ID: Cory [name], male    DOB: 01-02-1941  Age: 81 y.o. MRN: [id number]  Chief Complaint  Patient presents with   Transfer of Care    HPI  HTN: States that he is currenlty on amlodpidne. Carvedilol. hHas a cuff but does not check regularly  CHF: Cory [name], every 6 months to 1 year. Has an appointment coming up in December. Patient currently on carvedilol.   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 States he lived in [place] at that time. Currently on PPI and H2  DM2: Has stuff to check his blood sugar at home, but does not do it regularly. Has been doing metformin '500mg'$  daily.   Basal cell carcinoma: States that he sees Dermatology every 6 month. 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 States he is on Plavix, atorvastatin 40  DDD: history of epidural injectoin si nthe past. States that he is currenlty being followed by Cory [name]. Currenlty on steroids and tylenol 3  OAB: states that he is seeing urology and is currently on Gemtesa   Chest pain: States that it lasted approx 2-3 minutes on the left side of the chest. Described as a stab. Laying in bed. Got hot but no other symptoms.  He is also having lightheadedness. Some at rest some with movement and position changes.  States that he is currently holding lisinopril 20 mg due to being on the prednisone and Tylenol 3.    Review of Systems  Constitutional:  Negative for chills and fever.  Respiratory:  Negative for shortness of breath.   Cardiovascular:  Positive for chest pain (last night.) and leg swelling.  Neurological:  Positive for dizziness. Negative for headaches.  Psychiatric/Behavioral:  Negative for hallucinations and suicidal ideas.       Objective:     BP 132/68   Pulse 66   Temp (!) 96.5 F (35.8 C)   Resp 14   Ht '5\' 5"'$  (1.651 m)   Wt 160 lb 4 oz (72.7 kg)   SpO2 93%   BMI  26.67 kg/m    Physical Exam Vitals and nursing note reviewed.  Constitutional:      Appearance: Normal appearance.     Comments: Patient ambulating with cane in office.  HENT:     Right Ear: Tympanic membrane, ear canal and external ear normal.     Left Ear: Tympanic membrane, ear canal and external ear normal.     Mouth/Throat:     Mouth: Mucous membranes are moist.     Pharynx: Oropharynx is clear.  Eyes:     Extraocular Movements: Extraocular movements intact.     Pupils: Pupils are equal, round, and reactive to light.  Cardiovascular:     Rate and Rhythm: Normal rate and regular rhythm.     Heart sounds: Murmur heard.  Pulmonary:     Effort: Pulmonary effort is normal.     Breath sounds: Normal breath sounds.  Musculoskeletal:     Right lower leg: Edema present.     Left lower leg: Edema present.  Neurological:     General: No focal deficit present.     Mental Status: He is alert.     Deep Tendon Reflexes:     Reflex Scores:      Bicep reflexes are 2+ on the right side and 2+ on the left side.  Patellar reflexes are 2+ on the right side and 2+ on the left side.    Comments: Bilateral upper and lower extremity strength 5/5      Results for orders placed or performed in visit on 02/25/22  POCT glycosylated hemoglobin (Hb A1C)  Result Value Ref Range   Hemoglobin A1C 6.3 (A) 4.0 - 5.6 %   HbA1c POC (<> result, manual entry)     HbA1c, POC (prediabetic range)     HbA1c, POC (controlled diabetic range)        The ASCVD Risk score (Arnett DK, et al., 2019) failed to calculate for the following reasons:   The 2019 ASCVD risk score is only valid for ages 43 to 5    Assessment & Plan:   Problem List Items Addressed This Visit       Cardiovascular and Mediastinum   Hypertension    Patient currently maintained on lisinopril 20, amlodipine 5, carvedilol.  Patient was orthostatic in office.  Pending lab results.  May need to back down on some of his  medications if.  If we do decide to remove any agent likely amlodipine 5 mg as patient is experiencing bilateral lower extremity edema.        Digestive   Barrett esophagus    Patient currently on PPI and H2.  Patient states he had endoscopy unsure of last 1.  Continue medications as prescribed        Endocrine   Type 2 diabetes mellitus with hyperglycemia, without long-term current use of insulin ([name]) - Primary    Patient's A1c 6.5%.  Given the event that he is having intermittent lightheadedness and it being multifactorial we will discontinue metformin 500 mg daily.  Follow-up in 3 months for recheck of A1c.      Relevant Orders   POCT glycosylated hemoglobin (Hb A1C) (Completed)   CBC   Comprehensive metabolic panel   Microalbumin/Creatinine Ratio, Urine     Musculoskeletal and Integument   DDD (degenerative disc disease), lumbosacral    Patient currently being evaluated by Cory [name].  Continue taking medication as prescribed follow-up with him as recommended or if no improvement.        Other   History of CVA with residual deficit    History of CVA patient currently on atorvastatin 40 mg a couple but agreeable to 75 mg.  Infected right side patient does walk with a cane left and right side equal strength on physical exam today      Hyponatremia    History of the same has been evaluated by Cory [name] endocrinology.  Pending labs today      Chest pain    EKG within normal limits.  Does not sound cardiac related.  Pending lab results today      Relevant Orders   EKG 12-Lead (Completed)   Orthostatic vital signs   Lightheadedness    Orthostatic vital signs were positive in office.  Encourage patient to drink an electrolyte drink a day.  Ending lab results.  Likely will remove one of his blood pressure medicines.  Does have a history of hyponatremia      Relevant Orders   EKG 12-Lead (Completed)   Orthostatic vital signs    Return in about 3 months  (around 05/28/2022) for Dm recheck .    Cory Garret, NP

## 2022-02-25 NOTE — Assessment & Plan Note (Signed)
History of CVA patient currently on atorvastatin 40 mg a couple but agreeable to 75 mg.  Infected right side patient does walk with a cane left and right side equal strength on physical exam today

## 2022-02-25 NOTE — Assessment & Plan Note (Signed)
Patient currently on PPI and H2.  Patient states he had endoscopy unsure of last 1.  Continue medications as prescribed

## 2022-02-25 NOTE — Assessment & Plan Note (Signed)
EKG within normal limits.  Does not sound cardiac related.  Pending lab results today

## 2022-02-25 NOTE — Assessment & Plan Note (Signed)
Patient's A1c 6.5%.  Given the event that he is having intermittent lightheadedness and it being multifactorial we will discontinue metformin 500 mg daily.  Follow-up in 3 months for recheck of A1c.

## 2022-02-25 NOTE — Patient Instructions (Addendum)
We are going to discontinue the metformin and see how you do.  I want to see you in 3 months, sooner if you need me If you leg and back does not get better let Dr. Lorelei Pont know  I need you to call me and tell me the dose of lisinopril you have at home.

## 2022-02-25 NOTE — Assessment & Plan Note (Signed)
History of the same has been evaluated by Dr. Kelton Pillar endocrinology.  Pending labs today

## 2022-02-26 ENCOUNTER — Telehealth: Payer: Self-pay | Admitting: Nurse Practitioner

## 2022-02-26 ENCOUNTER — Encounter: Payer: Self-pay | Admitting: Nurse Practitioner

## 2022-02-26 LAB — COMPREHENSIVE METABOLIC PANEL
ALT: 19 U/L (ref 0–53)
AST: 18 U/L (ref 0–37)
Albumin: 3.8 g/dL (ref 3.5–5.2)
Alkaline Phosphatase: 42 U/L (ref 39–117)
BUN: 19 mg/dL (ref 6–23)
CO2: 26 mEq/L (ref 19–32)
Calcium: 9.1 mg/dL (ref 8.4–10.5)
Chloride: 98 mEq/L (ref 96–112)
Creatinine, Ser: 1.28 mg/dL (ref 0.40–1.50)
GFR: 51.99 mL/min — ABNORMAL LOW (ref >60.00)
Glucose, Bld: 184 mg/dL — ABNORMAL HIGH (ref 70–99)
Potassium: 4.7 mEq/L (ref 3.5–5.1)
Sodium: 131 mEq/L — ABNORMAL LOW (ref 135–145)
Total Bilirubin: 0.4 mg/dL (ref 0.2–1.2)
Total Protein: 6.5 g/dL (ref 6.0–8.3)

## 2022-02-26 LAB — CBC
HCT: 35.5 % — ABNORMAL LOW (ref 39.0–52.0)
Hemoglobin: 11.6 g/dL — ABNORMAL LOW (ref 13.0–17.0)
MCHC: 32.6 g/dL (ref 30.0–36.0)
MCV: 91.8 fl (ref 78.0–100.0)
Platelets: 206 10*3/uL (ref 150.0–400.0)
RBC: 3.87 Mil/uL — ABNORMAL LOW (ref 4.22–5.81)
RDW: 13.3 % (ref 11.5–15.5)
WBC: 10.4 10*3/uL (ref 4.0–10.5)

## 2022-02-26 NOTE — Telephone Encounter (Signed)
Patient wanted to let Catalina Antigua know that his medication lisinopril (ZESTRIL) 20 MG tablet  was 20 mg,not 10 mg,per Matt

## 2022-02-26 NOTE — Telephone Encounter (Signed)
Per chart we have Lisinopril 20 mg on file. Will send to PCP to review if this was in question but do not see anything in the notes

## 2022-02-26 NOTE — Telephone Encounter (Signed)
error 

## 2022-02-27 LAB — MICROALBUMIN / CREATININE URINE RATIO
Creatinine,U: 59.7 mg/dL
Microalb Creat Ratio: 1.5 mg/g (ref 0.0–30.0)
Microalb, Ur: 0.9 mg/dL (ref 0.0–1.9)

## 2022-02-27 NOTE — Telephone Encounter (Signed)
Great that is what we had documented.   thanks

## 2022-03-06 ENCOUNTER — Encounter: Payer: Self-pay | Admitting: Family Medicine

## 2022-03-06 NOTE — Addendum Note (Signed)
Addended by: Owens Loffler on: 03/06/2022 01:56 PM   Modules accepted: Orders

## 2022-03-15 ENCOUNTER — Ambulatory Visit
Admission: RE | Admit: 2022-03-15 | Discharge: 2022-03-15 | Disposition: A | Discharge status: Home / Self Care | Payer: Medicare Other | Source: Ambulatory Visit | Attending: Family Medicine | Admitting: Family Medicine

## 2022-03-15 DIAGNOSIS — M5416 Radiculopathy, lumbar region: Secondary | ICD-10-CM

## 2022-03-15 DIAGNOSIS — M5137 Other intervertebral disc degeneration, lumbosacral region: Secondary | ICD-10-CM

## 2022-03-18 NOTE — Addendum Note (Signed)
Addended by: Owens Loffler on: 03/18/2022 12:40 PM   Modules accepted: Orders

## 2022-03-19 ENCOUNTER — Ambulatory Visit: Payer: Medicare Other | Admitting: Internal Medicine

## 2022-04-02 ENCOUNTER — Ambulatory Visit: Payer: Medicare Other | Attending: Cardiovascular Disease | Admitting: Cardiovascular Disease

## 2022-04-02 ENCOUNTER — Encounter: Payer: Self-pay | Admitting: Cardiovascular Disease

## 2022-04-02 VITALS — BP 120/60 | HR 66 | Ht 65.0 in | Wt 170.5 lb

## 2022-04-02 DIAGNOSIS — I25810 Atherosclerosis of coronary artery bypass graft(s) without angina pectoris: Secondary | ICD-10-CM | POA: Diagnosis not present

## 2022-04-02 DIAGNOSIS — I739 Peripheral vascular disease, unspecified: Secondary | ICD-10-CM

## 2022-04-02 DIAGNOSIS — E785 Hyperlipidemia, unspecified: Secondary | ICD-10-CM

## 2022-04-02 DIAGNOSIS — I6521 Occlusion and stenosis of right carotid artery: Secondary | ICD-10-CM | POA: Diagnosis not present

## 2022-04-02 DIAGNOSIS — I10 Essential (primary) hypertension: Secondary | ICD-10-CM

## 2022-04-02 MED ORDER — AMLODIPINE BESYLATE 2.5 MG PO TABS: 2.5000 mg | ORAL_TABLET | Freq: Every day | ORAL | 1 refills | Status: DC

## 2022-04-02 NOTE — Progress Notes (Signed)
Cardiology Office Note   Date:  04/02/2022   ID:  Cory [name], DOB 1941-04-25, MRN [id number]  PCP:  Michela Pitcher, NP  Cardiologist:  Dr. Fletcher Anon  Chief Complaint  Patient presents with   Other    6 Month f/u c/o feeling imbalanced/dizzy yesterday had a fall. Meds reviewed verbally with pt.      History of Present Illness: Cory [name] is a 81 y.o. male who presents for a follow-up visit regarding peripheral arterial disease and coronary artery disease.   He has known history of coronary artery disease status post CABG, history of CVA, essential hypertension, hyperlipidemia, type 2 diabetes and history of vasovagal syncope.  His CABG was in 2000.  He had PCI of SVG to OM in 6546 complicated by aphasia and confusion but negative stroke work-up. 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  He was hospitalized at [facility] in January of 2022 with expressive aphasia.  CT was negative.  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  Carotid Doppler showed 60% right carotid stenosis.  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 He was seen for left calf claudication.  Lower extremity arterial Doppler done in July, 2022 showed an ABI of 1.04 on the right and 0.71 on the left.  Duplex showed significant stenosis in the left popliteal artery.  Subtotal occlusion could not be ruled out.  He was hospitalized in June of this year with severe sepsis due to E. coli bacteremia secondary to UTI.  He had acute on chronic kidney disease but that improved with hydration.  He reports recent episodes of orthostatic dizziness.  He had an episode yesterday where he felt dizzy after standing up after he was fixing the water bowl to his dog.  He lost his balance and fell.  He injured his right arm but was told no fractures.  He is wearing a  brace.  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     acetaminophen-codeine (TYLENOL #3) 300-30 MG tablet Take 1 tablet by mouth every 4 (four) hours as needed for moderate pain. 20 tablet 0   amLODipine (NORVASC) 5 MG tablet TAKE 1 TABLET EVERY DAY 90 tablet 1   aspirin EC 81 MG tablet Take 1 tablet (  81 mg total) by mouth daily. Swallow whole. 90 tablet 3   atorvastatin (LIPITOR) 40 MG tablet TAKE 1 TABLET EVERY DAY 90 tablet 3   carvedilol (COREG) 6.25 MG tablet TAKE 1 TABLET TWICE DAILY (STOP CLONIDINE) 180 tablet 0   clopidogrel (PLAVIX) 75 MG tablet TAKE 1 TABLET EVERY DAY 90 tablet 3   cycloSPORINE (RESTASIS) 0.05 % ophthalmic emulsion 1 drop 2 (two) times a day.     diclofenac Sodium (VOLTAREN) 1 % GEL Apply 2 g topically as needed.     escitalopram (LEXAPRO) 10 MG tablet Take 10 mg by mouth daily.     famotidine (PEPCID) 20 MG tablet Take 20 mg by mouth at bedtime.     fluorouracil (EFUDEX) 5 % cream Apply topically 2 (two) times daily. Bid for 7 days to nose and bil temples 30 g 1   fluticasone  (FLONASE) 50 MCG/ACT nasal spray USE 1 SPRAY IN BOTH NOSTRILS DAILY 48 g 3   GEMTESA 75 MG TABS Take 1 tablet by mouth daily. 90 tablet 3   latanoprost (XALATAN) 0.005 % ophthalmic solution daily.     lisinopril (ZESTRIL) 20 MG tablet Take 20 mg by mouth daily.     melatonin 5 MG TABS Take 5 mg by mouth at bedtime.     metFORMIN (GLUCOPHAGE) 500 MG tablet Take 1 tablet (500 mg total) by mouth daily with breakfast. 90 tablet 1   montelukast (SINGULAIR) 10 MG tablet TAKE 1 TABLET (10 MG TOTAL) BY MOUTH AT BEDTIME. 90 tablet 3   nitroGLYCERIN (NITROSTAT) 0.4 MG SL tablet Place 1 tablet (0.4 mg total) under the tongue every 5 (five) minutes as needed. 5 tablet 0   pantoprazole (PROTONIX) 40 MG tablet Take 1 tablet (40 mg total) by mouth daily. 90 tablet 3   sulfamethoxazole-trimethoprim (BACTRIM DS) 800-160 MG tablet Take 1 tablet by mouth 2 (two) times daily.     tamsulosin (FLOMAX) 0.4 MG CAPS capsule Take 1 capsule (0.4 mg total) by mouth daily. 90 capsule 3   traMADol (ULTRAM) 50 MG tablet Take 50 mg by mouth every 6 (six) hours as needed.     No current facility-administered medications for this visit.    Allergies:   Fesoterodine, Isosorbide, Tramadol, Grass pollen([id number] vern), Hydrocodone, Meperidine, and Penicillins    Social History:  The patient  reports that he has quit smoking. His smoking use included pipe. He has never used smokeless tobacco. He reports that he does not drink alcohol and does not use drugs.   Family History:  The patient's family history includes Heart Problems in his father and mother; Heart attack in his father; Heart disease in his brother and mother.    ROS:  Please see the history of present illness.   Otherwise, review of systems are positive for none.   All other systems are reviewed and negative.    PHYSICAL EXAM: VS:  BP 120/60 (BP Location: Left Arm, Patient Position: Sitting, Cuff Size: Normal)   Ht '5\' 5"'$  (1.651 m)   Wt 170 lb 8 oz (77.3 kg)    SpO2 98%   BMI 28.37 kg/m  , BMI Body mass index is 28.37 kg/m. GEN: Well nourished, well developed, in no acute distress  HEENT: normal  Neck: no JVD, carotid bruits, or masses Cardiac: RRR; no rubs, or gallops,no edema .  2/6 systolic murmur in the aortic area Respiratory:  clear to auscultation bilaterally, normal work of breathing GI: soft, nontender, nondistended, + BS MS: no deformity  or atrophy  Skin: warm and dry, no rash Neuro:  Strength and sensation are intact Psych: euthymic mood, full affect   EKG:  EKG is ordered today. The ekg ordered today demonstrates normal sinus rhythm with possible old inferior infarct.   Recent Labs: 05/01/2021: TSH 1.80 10/23/2021: B Natriuretic Peptide 483.3; Magnesium 2.1 02/25/2022: ALT 19; BUN 19; Creatinine, Ser 1.28; Hemoglobin 11.6; Platelets 206.0; Potassium 4.7; Sodium 131    Lipid Panel    Component Value Date/Time   CHOL 120 01/29/2021 1530   CHOL 137 03/13/2020 1123   TRIG 206.0 (H) 01/29/2021 1530   HDL 44.20 01/29/2021 1530   HDL 47 03/13/2020 1123   CHOLHDL 3 01/29/2021 1530   VLDL 41.2 (H) 01/29/2021 1530   LDLCALC 56 05/07/2020 0512   LDLCALC 65 03/13/2020 1123   LDLDIRECT 51.0 01/29/2021 1530      Wt Readings from Last 3 Encounters:  04/02/22 170 lb 8 oz (77.3 kg)  02/25/22 160 lb 4 oz (72.7 kg)  02/18/22 159 lb 6 oz (72.3 kg)          12/08/2020    9:55 AM  PAD Screen  Previous PAD dx? No  Previous surgical procedure? Yes  Dates of procedures Hx GABG/Cardiac cath  Pain with walking? Yes  Subsides with rest? Yes  Feet/toe relief with dangling? No  Painful, non-healing ulcers? No  Extremities discolored? No      ASSESSMENT AND PLAN:  1.  Peripheral arterial disease: The patient denies left leg claudication at this time.  No indication for revascularization.    2.  Moderate calcified right carotid stenosis: I requested a follow-up carotid Doppler especially with his increased symptoms of  dizziness.  3.  Coronary artery disease involving native coronary artery status post CABG: Currently with no anginal symptoms.  Previous PCI and SVG to OM.  Currently on dual antiplatelet therapy for both his coronary artery disease as well as recurrent strokes.    4.  Essential hypertension: Clonidine was stopped during last visit and he was started on small dose carvedilol instead.  Given his increased orthostatic dizziness, I elected to decrease amlodipine to 2.5 mg once daily.  5.  Hyperlipidemia: I reviewed most recent labs done in October which showed an LDL of 51.  This is at target.  Continue atorvastatin 40 mg daily.    Disposition:   FU with me in 6 months  Signed,  Kathlyn Sacramento, MD  04/02/2022 1:55 PM    [place]

## 2022-04-02 NOTE — Patient Instructions (Signed)
Medication Instructions:  DECREASE the Amlodipine to 2.5 mg once daily  *If you need a refill on your cardiac medications before your next appointment, please call your pharmacy*   Lab Work: None ordered If you have labs (blood work) drawn today and your tests are completely normal, you will receive your results only by: [place] (if you have MyChart) OR A paper copy in the mail If you have any lab test that is abnormal or we need to change your treatment, we will call you to review the results.   Testing/Procedures: Your physician has requested that you have a carotid duplex. This test is an ultrasound of the carotid arteries in your neck. It looks at blood flow through these arteries that supply the brain with blood.   Allow one hour for this exam.  There are no restrictions or special instructions.  This will take place at [facility] ([place] [id number], [place]    Follow-Up: At [facility], you and your health needs are our priority.  As part of our continuing mission to provide you with exceptional heart care, we have created designated Provider Care Teams.  These Care Teams include your primary Cardiologist (physician) and Advanced Practice Providers (APPs -  Physician Assistants and Nurse Practitioners) who all work together to provide you with the care you need, when you need it.  We recommend signing up for the patient portal called "MyChart".  Sign up information is provided on this After Visit Summary.  MyChart is used to connect with patients for Virtual Visits (Telemedicine).  Patients are able to view lab/test results, encounter notes, upcoming appointments, etc.  Non-urgent messages can be sent to your provider as well.   To learn more about what you can do with MyChart, go to [email or web address].    Your next appointment:   6 month(s)  The format for your next appointment:   In Person  Provider:   You may see Dr.  Fletcher Anon or one of the following Advanced Practice Providers on your designated Care Team:   Murray Hodgkins, NP Christell Faith, PA-C Cadence Kathlen Mody, PA-C Gerrie Nordmann, NP    Important Information About Sugar

## 2022-04-04 ENCOUNTER — Encounter: Payer: Self-pay | Admitting: Podiatry

## 2022-04-04 ENCOUNTER — Ambulatory Visit (INDEPENDENT_AMBULATORY_CARE_PROVIDER_SITE_OTHER): Payer: Medicare Other | Admitting: Podiatry

## 2022-04-04 VITALS — BP 146/67 | HR 69

## 2022-04-04 DIAGNOSIS — M79675 Pain in left toe(s): Secondary | ICD-10-CM | POA: Diagnosis not present

## 2022-04-04 DIAGNOSIS — B351 Tinea unguium: Secondary | ICD-10-CM

## 2022-04-04 DIAGNOSIS — M79674 Pain in right toe(s): Secondary | ICD-10-CM | POA: Diagnosis not present

## 2022-04-04 DIAGNOSIS — E119 Type 2 diabetes mellitus without complications: Secondary | ICD-10-CM | POA: Diagnosis not present

## 2022-04-04 NOTE — Progress Notes (Signed)
This patient returns to my office for at risk foot care.  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  This patient is unable to cut nails himself since the patient cannot reach his nails.These nails are painful walking and wearing shoes.  This patient presents for at risk foot care today.  General Appearance  Alert, conversant and in no acute stress.  Vascular  Dorsalis pedis and posterior tibial  pulses are palpable  bilaterally.  Capillary return is within normal limits  bilaterally. Temperature is within normal limits  bilaterally.  Neurologic  Senn-Weinstein monofilament wire test within normal limits  bilaterally. Muscle power within normal limits bilaterally.  Nails Thick disfigured discolored nails with subungual debris  from hallux to fifth toes right foot. No evidence of bacterial infection or drainage bilaterally.  Orthopedic .  No crepitus or effusions noted.  No bony pathology or digital deformities noted. No rearfoot motion STJ and ankle  right foot. Abnormal positioning third toe right foot.  Skin  normotropic skin with no porokeratosis noted bilaterally.  No signs of infections or ulcers noted.     Onychomycosis  Pain in right toes  Pain in left toes  Consent was obtained for treatment procedures.   Mechanical debridement of nails 1-5  bilaterally performed with a nail nipper.  Filed with dremel without incident.    Return office visit   3 months                   Told patient to return for periodic foot care and evaluation due to potential at risk complications.   Gardiner Barefoot DPM

## 2022-04-10 ENCOUNTER — Ambulatory Visit: Payer: Medicare Other | Attending: Cardiovascular Disease

## 2022-04-10 DIAGNOSIS — I6521 Occlusion and stenosis of right carotid artery: Secondary | ICD-10-CM | POA: Diagnosis present

## 2022-04-18 ENCOUNTER — Other Ambulatory Visit: Payer: Self-pay | Admitting: *Deleted

## 2022-04-18 ENCOUNTER — Encounter: Payer: Self-pay | Admitting: Cardiovascular Disease

## 2022-04-18 DIAGNOSIS — I779 Disorder of arteries and arterioles, unspecified: Secondary | ICD-10-CM

## 2022-04-19 ENCOUNTER — Encounter: Payer: Self-pay | Admitting: Cardiovascular Disease

## 2022-04-30 ENCOUNTER — Ambulatory Visit: Payer: Medicare Other | Admitting: Physician Assistant

## 2022-05-09 ENCOUNTER — Other Ambulatory Visit: Payer: Self-pay | Admitting: Cardiovascular Disease

## 2022-05-20 ENCOUNTER — Encounter: Payer: Self-pay | Admitting: Dermatology

## 2022-05-20 ENCOUNTER — Ambulatory Visit (INDEPENDENT_AMBULATORY_CARE_PROVIDER_SITE_OTHER): Payer: Medicare Other | Admitting: Dermatology

## 2022-05-20 VITALS — BP 161/69 | HR 67

## 2022-05-20 DIAGNOSIS — L570 Actinic keratosis: Secondary | ICD-10-CM | POA: Diagnosis not present

## 2022-05-20 DIAGNOSIS — L578 Other skin changes due to chronic exposure to nonionizing radiation: Secondary | ICD-10-CM | POA: Diagnosis not present

## 2022-05-20 DIAGNOSIS — L821 Other seborrheic keratosis: Secondary | ICD-10-CM

## 2022-05-20 DIAGNOSIS — Z5111 Encounter for antineoplastic chemotherapy: Secondary | ICD-10-CM

## 2022-05-20 DIAGNOSIS — Z79899 Other long term (current) drug therapy: Secondary | ICD-10-CM | POA: Diagnosis not present

## 2022-05-20 NOTE — Patient Instructions (Addendum)
-- Start 5-fluorouracil/calcipotriene cream twice a day for 7 days to affected areas including nose, frontal scalp, right forehead and temples.     Cryotherapy Aftercare  Wash gently with soap and water everyday.   Apply Vaseline and Band-Aid daily until healed.   Recommend daily broad spectrum sunscreen SPF 30+ to sun-exposed areas, reapply every 2 hours as needed. Call for new or changing lesions.  Staying in the shade or wearing long sleeves, sun glasses (UVA+UVB protection) and wide brim hats (4-inch brim around the entire circumference of the hat) are also recommended for sun protection.    Due to recent changes in healthcare laws, you may see results of your pathology and/or laboratory studies on MyChart before the doctors have had a chance to review them. We understand that in some cases there may be results that are confusing or concerning to you. Please understand that not all results are received at the same time and often the doctors may need to interpret multiple results in order to provide you with the best plan of care or course of treatment. Therefore, we ask that you please give [place] 2 business days to thoroughly review all your results before contacting the office for clarification. Should we see a critical lab result, you will be contacted sooner.   If You Need Anything After Your Visit  If you have any questions or concerns for your doctor, please call our main line at [phone] and press option 4 to reach your doctor's medical assistant. If no one answers, please leave a voicemail as directed and we will return your call as soon as possible. Messages left after 4 pm will be answered the following business day.   You may also send [place] a message via [place]. We typically respond to MyChart messages within 1-2 business days.  For prescription refills, please ask your pharmacy to contact our office. Our fax number is [phone].  If you have an urgent issue when the clinic  is closed that cannot wait until the next business day, you can page your doctor at the number below.    Please note that while we do our best to be available for urgent issues outside of office hours, we are not available 24/7.   If you have an urgent issue and are unable to reach [place], you may choose to seek medical care at your doctor's office, retail clinic, urgent care center, or emergency room.  If you have a medical emergency, please immediately call 911 or go to the emergency department.  Pager Numbers  - Dr. Nehemiah Massed: [phone]  - Dr. Laurence Ferrari: [phone]  - Dr. Nicole Kindred: [phone]  In the event of inclement weather, please call our main line at [phone] for an update on the status of any delays or closures.  Dermatology Medication Tips: Please keep the boxes that topical medications come in in order to help keep track of the instructions about where and how to use these. Pharmacies typically print the medication instructions only on the boxes and not directly on the medication tubes.   If your medication is too expensive, please contact our office at [phone] option 4 or send [place] a message through [place].   We are unable to tell what your co-pay for medications will be in advance as this is different depending on your insurance coverage. However, we may be able to find a substitute medication at lower cost or fill out paperwork to get insurance to cover a needed medication.   If a  prior authorization is required to get your medication covered by your insurance company, please allow [place] 1-2 business days to complete this process.  Drug prices often vary depending on where the prescription is filled and some pharmacies may offer cheaper prices.  The website www.goodrx.com contains coupons for medications through different pharmacies. The prices here do not account for what the cost may be with help from insurance (it may be cheaper with your insurance), but the website  can give you the price if you did not use any insurance.  - You can print the associated coupon and take it with your prescription to the pharmacy.  - You may also stop by our office during regular business hours and pick up a GoodRx coupon card.  - If you need your prescription sent electronically to a different pharmacy, notify our office through [facility] or by phone at [phone] option 4.     Si Usted Necesita Algo Despus de Su Visita  Tambin puede enviarnos un mensaje a travs de Pharmacist, community. Por lo general respondemos a los mensajes de MyChart en el transcurso de 1 a 2 das hbiles.  Para renovar recetas, por favor pida a su farmacia que se ponga en contacto con nuestra oficina. Harland Dingwall de fax es [place] [phone].  Si tiene un asunto urgente cuando la clnica est cerrada y que no puede esperar hasta el siguiente da hbil, puede llamar/localizar a su doctor(a) al nmero que aparece a continuacin.   Por favor, tenga en cuenta que aunque hacemos todo lo posible para estar disponibles para asuntos urgentes fuera del horario de [place], no estamos disponibles las 24 horas del da, los 7 das de la [place].   Si tiene un problema urgente y no puede comunicarse con nosotros, puede optar por buscar atencin mdica  en el consultorio de su doctor(a), en una clnica privada, en un centro de atencin urgente o en una sala de emergencias.  Si tiene Engineering geologist, por favor llame inmediatamente al 911 o vaya a la sala de emergencias.  Nmeros de bper  - Dr. Nehemiah Massed: [phone]  - Dra. Moye: [phone]  - Dra. Nicole Kindred: [phone]  En caso de inclemencias del [place], por favor llame a Johnsie Kindred principal al [phone] para una actualizacin sobre el [place] de cualquier retraso o cierre.  Consejos para la medicacin en dermatologa: Por favor, guarde las cajas en las que vienen los medicamentos de uso tpico para ayudarle a seguir las instrucciones  sobre dnde y cmo usarlos. Las farmacias generalmente imprimen las instrucciones del medicamento slo en las cajas y no directamente en los tubos del [place].   Si su medicamento es muy caro, por favor, pngase en contacto con Zigmund Daniel llamando al [phone] y presione la opcin 4 o envenos un mensaje a travs de Pharmacist, community.   No podemos decirle cul ser su copago por los medicamentos por adelantado ya que esto es diferente dependiendo de la cobertura de su seguro. Sin embargo, es posible que podamos encontrar un medicamento sustituto a Electrical engineer un formulario para que el seguro cubra el medicamento que se considera necesario.   Si se requiere una autorizacin previa para que su compaa de seguros [place] su medicamento, por favor permtanos de 1 a 2 das hbiles para completar este proceso.  Los precios de los medicamentos varan con frecuencia dependiendo del Environmental consultant de dnde se surte la receta y alguna farmacias pueden ofrecer precios ms baratos.  El sitio web www.goodrx.com tiene cupones  para medicamentos de diferentes farmacias. Los precios aqu no tienen en cuenta lo que podra costar con la ayuda del seguro (puede ser ms barato con su seguro), pero el sitio web puede darle el precio si no utiliz ningn seguro.  - Puede imprimir el cupn correspondiente y llevarlo con su receta a la farmacia.  - Tambin puede pasar por nuestra oficina durante el horario de atencin regular y recoger una tarjeta de cupones de GoodRx.  - Si necesita que su receta se enve electrnicamente a una farmacia diferente, informe a nuestra oficina a travs de MyChart de [place] o por telfono llamando al [phone] y presione la opcin 4.  

## 2022-05-20 NOTE — Progress Notes (Signed)
Follow-Up Visit   Subjective  Cory [name] is a 82 y.o. male who presents for the following: Actinic Keratosis (3 month follow up. LN2 treatment to areas on scalp at last visit. Has not used Urea on scalp since last visit. Has not started 5FU/Calcipotriene to nose and temples, states he does have the cream). The patient has spots, moles and lesions to be evaluated, some may be new or changing and the patient has concerns that these could be cancer.  The following portions of the chart were reviewed this encounter and updated as appropriate:  Tobacco  Allergies  Meds  Problems  Med Hx  Surg Hx  Fam Hx     Review of Systems: No other skin or systemic complaints except as noted in HPI or Assessment and Plan.  Objective  Well appearing patient in no apparent distress; mood and affect are within normal limits.  A focused examination was performed including scalp, face, ears, neck, arms. Relevant physical exam findings are noted in the Assessment and Plan.  Scalp and face x5 (5) Erythematous thin papules/macules with gritty scale.    Assessment & Plan   Actinic Damage with PreCancerous Actinic Keratoses Counseling for Topical Chemotherapy Management: Patient exhibits: - Severe, confluent actinic changes with pre-cancerous actinic keratoses that is secondary to cumulative UV radiation exposure over time - Condition that is severe; chronic, not at goal. - diffuse scaly erythematous macules and papules with underlying dyspigmentation - Discussed Prescription "Field Treatment" topical Chemotherapy for Severe, Chronic Confluent Actinic Changes with Pre-Cancerous Actinic Keratoses Field treatment involves treatment of an entire area of skin that has confluent Actinic Changes (Sun/ Ultraviolet light damage) and PreCancerous Actinic Keratoses by method of PhotoDynamic Therapy (PDT) and/or prescription Topical Chemotherapy agents such as 5-fluorouracil, 5-fluorouracil/calcipotriene,  and/or imiquimod.  The purpose is to decrease the number of clinically evident and subclinical PreCancerous lesions to prevent progression to development of skin cancer by chemically destroying early precancer changes that may or may not be visible.  It has been shown to reduce the risk of developing skin cancer in the treated area. As a result of treatment, redness, scaling, crusting, and open sores may occur during treatment course. One or more than one of these methods may be used and may have to be used several times to control, suppress and eliminate the PreCancerous changes. Discussed treatment course, expected reaction, and possible side effects. - Recommend daily broad spectrum sunscreen SPF 30+ to sun-exposed areas, reapply every 2 hours as needed.  - Staying in the shade or wearing long sleeves, sun glasses (UVA+UVB protection) and wide brim hats (4-inch brim around the entire circumference of the hat) are also recommended. - Call for new or changing lesions.  -- Start 5-fluorouracil/calcipotriene cream twice a day for 7 days to affected areas including nose, frontal scalp, right forehead and temples.    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 They are chronic with expected duration over 1 year. A portion of actinic keratoses will progress to squamous cell carcinoma of the skin. It is not possible to reliably predict which spots will progress to skin cancer and so treatment is recommended to prevent development of skin cancer.  Recommend daily broad spectrum sunscreen SPF 30+ to sun-exposed areas, reapply every 2 hours as needed.  Recommend staying in the shade or wearing long sleeves, sun glasses (UVA+UVB protection) and wide brim hats (  4-inch brim around the entire circumference of the hat). Call for new or changing lesions.  Destruction of lesion - Scalp and  face x5 Complexity: simple   Destruction method: cryotherapy   Informed consent: discussed and consent obtained   Timeout:  patient name, date of birth, surgical site, and procedure verified Lesion destroyed using liquid nitrogen: Yes   Region frozen until ice ball extended beyond lesion: Yes   Outcome: patient tolerated procedure well with no complications   Post-procedure details: wound care instructions given   Additional details:  Prior to procedure, discussed risks of blister formation, small wound, skin dyspigmentation, or rare scar following cryotherapy. Recommend Vaseline ointment to treated areas while healing.   Related Medications fluorouracil (EFUDEX) 5 % cream Apply topically 2 (two) times daily. Bid for 7 days to nose and bil temples  Seborrheic Keratoses - Stuck-on, waxy, tan-brown papules and/or plaques  - Benign-appearing - Discussed benign etiology and prognosis. - Observe - Call for any changes  Return in about 2 months (around 07/19/2022) for AK Follow Up.  I, Emelia Salisbury, CMA, am acting as scribe for Sarina Ser, MD. Documentation: I have reviewed the above documentation for accuracy and completeness, and I agree with the above.  Sarina Ser, MD

## 2022-05-28 ENCOUNTER — Encounter: Payer: Self-pay | Admitting: Nurse Practitioner

## 2022-05-28 ENCOUNTER — Telehealth: Payer: Self-pay | Admitting: Nurse Practitioner

## 2022-05-28 ENCOUNTER — Ambulatory Visit (INDEPENDENT_AMBULATORY_CARE_PROVIDER_SITE_OTHER): Payer: Medicare Other | Admitting: Nurse Practitioner

## 2022-05-28 VITALS — BP 124/62 | HR 67 | Ht 65.0 in | Wt 167.0 lb

## 2022-05-28 DIAGNOSIS — R42 Dizziness and giddiness: Secondary | ICD-10-CM | POA: Diagnosis not present

## 2022-05-28 DIAGNOSIS — E871 Hypo-osmolality and hyponatremia: Secondary | ICD-10-CM

## 2022-05-28 DIAGNOSIS — I10 Essential (primary) hypertension: Secondary | ICD-10-CM

## 2022-05-28 DIAGNOSIS — E1165 Type 2 diabetes mellitus with hyperglycemia: Secondary | ICD-10-CM

## 2022-05-28 LAB — POCT GLYCOSYLATED HEMOGLOBIN (HGB A1C): Hemoglobin A1C: 5.8 % — AB (ref 4.0–5.6)

## 2022-05-28 LAB — BASIC METABOLIC PANEL
BUN: 16 mg/dL (ref 6–23)
CO2: 28 mEq/L (ref 19–32)
Calcium: 9 mg/dL (ref 8.4–10.5)
Chloride: 101 mEq/L (ref 96–112)
Creatinine, Ser: 1.16 mg/dL (ref 0.40–1.50)
GFR: 58.41 mL/min — ABNORMAL LOW (ref >60.00)
Glucose, Bld: 81 mg/dL (ref 70–99)
Potassium: 5.2 mEq/L — ABNORMAL HIGH (ref 3.5–5.1)
Sodium: 135 mEq/L (ref 135–145)

## 2022-05-28 NOTE — Progress Notes (Signed)
Established Patient Office Visit  Subjective   Patient ID: Cory [name], male    DOB: 09/05/1940  Age: 82 y.o. MRN: [id number]  Chief Complaint  Patient presents with   Diabetes    Diabetes Pertinent negatives for hypoglycemia include no headaches. Pertinent negatives for diabetes include no chest pain.    HTN/orthostatic: patient currently on  amlodipine, carvedilol, lisinopril. There was a decrease in amlodpine to 2.'5mg'$ . States that he will still have an occasional dizziness. States that he is still not drinking enough. States that he has a cuff but has not checked it.   Thinks that he has fallen a couple times at home. States that he went  to the UC and was treated for a skin tear and fall. States that he was at the park and placing the dog in the car. He felt light headed and tipped over.  State that he has done PT in the past. States that he will finish tomorrow. He saw back specialist and started PT with them.  DM2: patient is currently on metformin 500 mg daily. Last A1C was 6.3. Does not check his blood sugars at home. States that he is exercising more than he was at last office visit.      Review of Systems  Constitutional:  Negative for chills and fever.  Respiratory:  Negative for shortness of breath.   Cardiovascular:  Negative for chest pain.  Gastrointestinal:  Positive for diarrhea. Negative for abdominal pain, nausea and vomiting.  Neurological:  Negative for headaches.  Psychiatric/Behavioral:  Negative for hallucinations and suicidal ideas.       Objective:     BP 124/62   Pulse 67   Ht '5\' 5"'$  (1.651 m)   Wt 167 lb (75.8 kg)   SpO2 96%   BMI 27.79 kg/m    Physical Exam Vitals and nursing note reviewed.  Constitutional:      Appearance: Normal appearance.     Comments: Cane in office   Cardiovascular:     Rate and Rhythm: Normal rate and regular rhythm.     Heart sounds: Normal heart sounds.  Pulmonary:     Effort: Pulmonary effort is  normal.     Breath sounds: Normal breath sounds.  Neurological:     Mental Status: He is alert.      Results for orders placed or performed in visit on 05/28/22  HgB A1c  Result Value Ref Range   Hemoglobin A1C 5.8 (A) 4.0 - 5.6 %   HbA1c POC (<> result, manual entry)     HbA1c, POC (prediabetic range)     HbA1c, POC (controlled diabetic range)        The ASCVD Risk score (Arnett DK, et al., 2019) failed to calculate for the following reasons:   The 2019 ASCVD risk score is only valid for ages 70 to 18    Assessment & Plan:   Problem List Items Addressed This Visit       Cardiovascular and Mediastinum   Hypertension    Currently maintained amlodipine 2.5 mg carvedilol and lisinopril 20 mg.  Continue medication as prescribed blood pressure within normal limits today      Relevant Orders   Basic metabolic panel     Endocrine   Type 2 diabetes mellitus with hyperglycemia, without long-term current use of insulin ([place]) - Primary    Well-controlled without any medication per patient report.  Will discontinue metformin altogether follow-up in 3 months  Relevant Orders   HgB A1c (Completed)   Basic metabolic panel     Other   Hyponatremia    Pending repeat BMP today      Relevant Orders   Basic metabolic panel   Lightheadedness    Patient has had some improvement with reduction of amlodipine dosing.  Still working on fluid intake.  Will reach out to cardiology to see if he can discontinue amlodipine 2.5 mg altogether.  He has had several falls since her last office visit.  Per patient report 1 seems to be lightheaded related the other was mechanical.  He is using a cane in office      Relevant Orders   Basic metabolic panel    Return in about 3 months (around 08/27/2022) for BP recheck, DM recheck.    Romilda Garret, NP

## 2022-05-28 NOTE — Patient Instructions (Signed)
Nice to see you today I will be in touch with the labs once I have them Follow up in 3 months, sooner if you need me

## 2022-05-28 NOTE — Assessment & Plan Note (Signed)
Pending repeat BMP today

## 2022-05-28 NOTE — Assessment & Plan Note (Signed)
Currently maintained amlodipine 2.5 mg carvedilol and lisinopril 20 mg.  Continue medication as prescribed blood pressure within normal limits today

## 2022-05-28 NOTE — Assessment & Plan Note (Signed)
Well-controlled without any medication per patient report.  Will discontinue metformin altogether follow-up in 3 months

## 2022-05-28 NOTE — Telephone Encounter (Signed)
Dr. Fletcher Anon,  I saw Cory [name] in office. He has had several falls since last visit with me in October. I saw the amlodipine was reduced. I was wanting to discontinue it but wanted to clear it with you since you sent in the last script  Thanks, Tish Frederickson

## 2022-05-28 NOTE — Assessment & Plan Note (Addendum)
Patient has had some improvement with reduction of amlodipine dosing.  Still working on fluid intake.  Will reach out to cardiology to see if he can discontinue amlodipine 2.5 mg altogether.  He has had several falls since her last office visit.  Per patient report 1 seems to be lightheaded related the other was mechanical.  He is using a cane in office

## 2022-05-29 ENCOUNTER — Encounter: Payer: Self-pay | Admitting: Dermatology

## 2022-05-29 NOTE — Telephone Encounter (Signed)
Okay to stop amlodipine from my standpoint.  Thanks.

## 2022-05-30 ENCOUNTER — Other Ambulatory Visit: Payer: Self-pay

## 2022-05-30 NOTE — Telephone Encounter (Signed)
Can we call and tell the patient to STOP taking the amlodipine medication all together please. I have cleared this through his cardiologist Dr. Fletcher Anon

## 2022-05-30 NOTE — Telephone Encounter (Signed)
Spoke with patient and advised him to stop Amlodipine as of today. Patient voiced understanding. Nothing further needed at this time.

## 2022-06-11 ENCOUNTER — Other Ambulatory Visit: Payer: Self-pay | Admitting: Student

## 2022-06-11 ENCOUNTER — Encounter: Payer: Self-pay | Admitting: Emergency Medicine

## 2022-06-11 ENCOUNTER — Emergency Department
Admission: EM | Admit: 2022-06-11 | Discharge: 2022-06-11 | Disposition: A | Discharge status: Home / Self Care | Payer: Medicare Other | Attending: Emergency Medicine | Admitting: Emergency Medicine

## 2022-06-11 ENCOUNTER — Other Ambulatory Visit: Payer: Self-pay

## 2022-06-11 DIAGNOSIS — R4189 Other symptoms and signs involving cognitive functions and awareness: Secondary | ICD-10-CM

## 2022-06-11 DIAGNOSIS — I160 Hypertensive urgency: Secondary | ICD-10-CM | POA: Diagnosis not present

## 2022-06-11 DIAGNOSIS — Z8673 Personal history of transient ischemic attack (TIA), and cerebral infarction without residual deficits: Secondary | ICD-10-CM

## 2022-06-11 DIAGNOSIS — I10 Essential (primary) hypertension: Secondary | ICD-10-CM | POA: Diagnosis present

## 2022-06-11 LAB — CBC WITH DIFFERENTIAL/PLATELET
Abs Immature Granulocytes: 0.01 10*3/uL (ref 0.00–0.07)
Basophils Absolute: 0.1 10*3/uL (ref 0.0–0.1)
Basophils Relative: 1 %
Eosinophils Absolute: 0.3 10*3/uL (ref 0.0–0.5)
Eosinophils Relative: 4 %
HCT: 32.1 % — ABNORMAL LOW (ref 39.0–52.0)
Hemoglobin: 10 g/dL — ABNORMAL LOW (ref 13.0–17.0)
Immature Granulocytes: 0 %
Lymphocytes Relative: 30 %
Lymphs Abs: 1.8 10*3/uL (ref 0.7–4.0)
MCH: 26.9 pg (ref 26.0–34.0)
MCHC: 31.2 g/dL (ref 30.0–36.0)
MCV: 86.3 fL (ref 80.0–100.0)
Monocytes Absolute: 0.8 10*3/uL (ref 0.1–1.0)
Monocytes Relative: 13 %
Neutro Abs: 3.1 10*3/uL (ref 1.7–7.7)
Neutrophils Relative %: 52 %
Platelets: 199 10*3/uL (ref 150–400)
RBC: 3.72 MIL/uL — ABNORMAL LOW (ref 4.22–5.81)
RDW: 13.8 % (ref 11.5–15.5)
WBC: 6.1 10*3/uL (ref 4.0–10.5)
nRBC: 0 % (ref 0.0–0.2)

## 2022-06-11 LAB — COMPREHENSIVE METABOLIC PANEL
ALT: 19 U/L (ref 0–44)
AST: 27 U/L (ref 15–41)
Albumin: 3.8 g/dL (ref 3.5–5.0)
Alkaline Phosphatase: 43 U/L (ref 38–126)
Anion gap: 8 (ref 5–15)
BUN: 17 mg/dL (ref 8–23)
CO2: 25 mmol/L (ref 22–32)
Calcium: 8.8 mg/dL — ABNORMAL LOW (ref 8.9–10.3)
Chloride: 102 mmol/L (ref 98–111)
Creatinine, Ser: 1.19 mg/dL (ref 0.61–1.24)
GFR, Estimated: >60 mL/min (ref >60)
Glucose, Bld: 94 mg/dL (ref 70–99)
Potassium: 4 mmol/L (ref 3.5–5.1)
Sodium: 135 mmol/L (ref 135–145)
Total Bilirubin: 0.4 mg/dL (ref 0.3–1.2)
Total Protein: 6.7 g/dL (ref 6.5–8.1)

## 2022-06-11 LAB — TROPONIN I (HIGH SENSITIVITY): Troponin I (High Sensitivity): 15 ng/L (ref <18)

## 2022-06-11 MED ORDER — CLONIDINE HCL 0.1 MG PO TABS
0.1000 mg | ORAL_TABLET | Freq: Once | ORAL | Status: AC
  Administered 2022-06-11: 0.1 mg via ORAL
  Filled 2022-06-11: qty 1

## 2022-06-11 NOTE — ED Provider Notes (Signed)
[facility] Provider Note  Patient Contact: 5:33 PM (approximate)   History   Hypertension   HPI  Cory [name] is a 82 y.o. male who presents the emergency department complaining of elevated blood pressure readings.  Patient states that he is followed by multiple specialist including cardiology and neurology.  He has had some intermittent dizziness, was seeing neurology and they recommended stopping one of his antihypertensives as his blood pressure has been well-controlled.  Patient came off his amlodipine last week and since then he has had a slow rise in his systolic blood pressure.  Patient presents today with blood pressures in the [id number] and A999333 systolic.  Patient has multiple chronic ongoing symptoms but states again that these are all chronic he is most concerned just due to his slowly rising blood pressure.  On my assessment with the patient, he did have a reading of 190/102.  Denies headache, vision changes, unilateral weakness, significant chest pain, shortness of breath.     Physical Exam   Triage Vital Signs: ED Triage Vitals  Enc Vitals Group     BP 06/11/22 1528 (!) 162/85     Pulse Rate 06/11/22 1528 69     Resp 06/11/22 1528 18     Temp 06/11/22 1528 98.3 F (36.8 C)     Temp Source 06/11/22 1528 Oral     SpO2 06/11/22 1528 96 %     Weight 06/11/22 1527 167 lb (75.8 kg)     Height 06/11/22 1527 5' 5"$  (1.651 m)     Head Circumference --      Peak Flow --      Pain Score 06/11/22 1528 0     Pain Loc --      Pain Edu? --      Excl. in [place]? --     Most recent vital signs: Vitals:   06/11/22 2003 06/11/22 2030  BP: (!) 183/87 (!) 170/105  Pulse:  (!) 58  Resp:  17  Temp:    SpO2:  100%     General: Alert and in no acute distress.  Neck: No stridor.  Hematological/Lymphatic/Immunilogical: No cervical lymphadenopathy. Cardiovascular:  Good peripheral perfusion Respiratory: Normal respiratory effort without tachypnea or  retractions. Lungs CTAB. Good air entry to the bases with no decreased or absent breath sounds Musculoskeletal: Full range of motion to all extremities.  Neurologic:  No gross focal neurologic deficits are appreciated.  Cranial nerves II through XII grossly intact. Skin:   No rash noted Other:   ED Results / Procedures / Treatments   Labs (all labs ordered are listed, but only abnormal results are displayed) Labs Reviewed  COMPREHENSIVE METABOLIC PANEL - Abnormal; Notable for the following components:      Result Value   Calcium 8.8 (*)    All other components within normal limits  CBC WITH DIFFERENTIAL/PLATELET - Abnormal; Notable for the following components:   RBC 3.72 (*)    Hemoglobin 10.0 (*)    HCT 32.1 (*)    All other components within normal limits  TROPONIN I (HIGH SENSITIVITY)     EKG  ED ECG REPORT I, Charline Bills Kamarius Buckbee,  personally viewed and interpreted this ECG.   Date: 06/16/2022  EKG Time: 1800 hrs.  Rate: 64 bpm  Rhythm: there are no previous tracings available for comparison, normal sinus rhythm  Axis: Normal axis  Intervals:none  ST&T Change: No gross ST elevation or depression noted.    RADIOLOGY  No results found.  PROCEDURES:  Critical Care performed: No  Procedures   MEDICATIONS ORDERED IN ED: Medications  cloNIDine (CATAPRES) tablet 0.1 mg (0.1 mg Oral Given 06/11/22 1755)  cloNIDine (CATAPRES) tablet 0.1 mg (0.1 mg Oral Given 06/11/22 2003)     IMPRESSION / MDM / ASSESSMENT AND PLAN / ED COURSE  I reviewed the triage vital signs and the nursing notes.                                 Differential diagnosis includes, but is not limited to, hypertensive, hypertensive urgency, hypertensive emergency, AKI, CVA, ACS/STEMI  The patient is on the cardiac monitor to evaluate for evidence of arrhythmia and/or significant heart rate changes.  Patient's presentation is most consistent with acute presentation with potential threat  to life or bodily function.   Patient's diagnosis is consistent with hypertensive urgency.  Patient presents emergency department with elevated blood pressure readings.  Patient had been well-controlled on a regimen of antihypertensives but had some intermittent dizziness.  Neurology had determined that it would be advisable to have the patient's stop one of his antihypertensives to see if this corrected his dizziness.  Over the past week he has had slowly rising blood pressures until today he was in the [id number] systolic.  While here patient did rise into the A999333 systolic but had no associated headache, vision changes, unilateral weakness, chest pain, shortness of breath.  Patient had reassuring labs, EKG, troponin.  Patient was given 2 doses of 0.1 mg of Catapres.  Initial dose only slightly lower the patient's hypertension but second dose significantly improved his systolic pressures.  At this time patient remains asymptomatic.  I have advised the patient to restart his anti a hypertensive medications and follow-up with his cardiologist.  Concerning signs and symptoms and return precautions discussed with the patient..  Patient is given ED precautions to return to the ED for any worsening or new symptoms.     FINAL CLINICAL IMPRESSION(S) / ED DIAGNOSES   Final diagnoses:  Hypertensive urgency     Rx / DC Orders   ED Discharge Orders     None        Note:  This document was prepared using Dragon voice recognition software and may include unintentional dictation errors.   Brynda Peon 06/11/22 2126    Rada Hay, MD 06/11/22 [phone]

## 2022-06-11 NOTE — ED Notes (Signed)
Walked into room. Patient is sitting in chair eating crackers. No complaints of chest pain, headache or dizziness. Patient has chronic numbness and tingling in right leg and foot.

## 2022-06-11 NOTE — ED Triage Notes (Addendum)
Pt presents to the ED via POV due to hypertension. Pt states he has a hx of hypertension but was recently taking off one of his BP medication 05/05/22 due to "controlled BP". Pt denies HA, NVD, and blurry vision. Pt states he doesn't take his medications as regular as he should. Pt continues to say he has to use a larger cuff due to a leak in his other cuff. Pt A&Ox4

## 2022-06-12 ENCOUNTER — Encounter: Payer: Self-pay | Admitting: Nurse Practitioner

## 2022-06-12 NOTE — Telephone Encounter (Signed)
Spoke to pt, scheduled ED f/u for 06/19/22

## 2022-06-12 NOTE — Telephone Encounter (Signed)
Can we get patient in office for a follow up following the ED visit for his blood pressure

## 2022-06-13 ENCOUNTER — Ambulatory Visit: Payer: Medicare Other | Admitting: Family Medicine

## 2022-06-15 ENCOUNTER — Ambulatory Visit
Admission: RE | Admit: 2022-06-15 | Discharge: 2022-06-15 | Disposition: A | Discharge status: Home / Self Care | Payer: Medicare Other | Source: Ambulatory Visit | Attending: Student | Admitting: Student

## 2022-06-15 DIAGNOSIS — Z8673 Personal history of transient ischemic attack (TIA), and cerebral infarction without residual deficits: Secondary | ICD-10-CM | POA: Insufficient documentation

## 2022-06-15 DIAGNOSIS — R4189 Other symptoms and signs involving cognitive functions and awareness: Secondary | ICD-10-CM | POA: Insufficient documentation

## 2022-06-15 MED ORDER — GADOBUTROL 1 MMOL/ML IV SOLN
7.5000 mL | Freq: Once | INTRAVENOUS | Status: AC | PRN
  Administered 2022-06-15: 7.5 mL via INTRAVENOUS

## 2022-06-18 ENCOUNTER — Telehealth: Payer: Self-pay | Admitting: Cardiovascular Disease

## 2022-06-18 NOTE — Telephone Encounter (Signed)
Patient states he went to neurology office at [facility] and the PA there advised him to stop his Amlodipine. States he went to the ED and " I checked out fine". States they did an MRI of brain during that visit. During his ED visit, the MD there advised him to restart his Amlodipine. States he is taking 29m daily. Please call to discuss.

## 2022-06-18 NOTE — Telephone Encounter (Signed)
Returned the call to the patient concerning the Amlodipine. The amlodipine was discontinued at the end of January. The patient has since been seen in the ED for hypertension. It was recommended that he restart the Amlodipine 5 mg once daily and follow up with cardiology.   The patient does have a follow up with PCP tomorrow 2/20.

## 2022-06-19 ENCOUNTER — Ambulatory Visit (INDEPENDENT_AMBULATORY_CARE_PROVIDER_SITE_OTHER): Payer: Medicare Other | Admitting: Nurse Practitioner

## 2022-06-19 ENCOUNTER — Encounter: Payer: Self-pay | Admitting: Nurse Practitioner

## 2022-06-19 VITALS — BP 138/64 | HR 58 | Temp 97.6°F | Resp 16 | Ht 65.0 in | Wt 169.0 lb

## 2022-06-19 DIAGNOSIS — I10 Essential (primary) hypertension: Secondary | ICD-10-CM

## 2022-06-19 DIAGNOSIS — K219 Gastro-esophageal reflux disease without esophagitis: Secondary | ICD-10-CM | POA: Diagnosis not present

## 2022-06-19 DIAGNOSIS — N644 Mastodynia: Secondary | ICD-10-CM | POA: Diagnosis not present

## 2022-06-19 NOTE — Progress Notes (Signed)
Established Patient Office Visit  Subjective   Patient ID: CARLE LAQUE, male    DOB: 06/22/1940  Age: 82 y.o. MRN: [id number]  Chief Complaint  Patient presents with   Hospitalization Follow-up   HPI  Hospital follow up: patient was seen by neruology on 06/06/2022. Previously they had asked him to discontinue his amlodipine but hat did not help in the dizziness he was experiencing.  He went to the ED on 06/11/2022. In the ED he was given 2 doses of clonidine that did lower his bp. He was advised to start the amlodipine back   States that he started back on the amlodpine 5 mg. States that he has felt some dizziness. States that he has been checking his blood pressure at home and has been getting readings "that have been pretty good". Does not remebr the number. States that he has been drinikng G zero throuht the day   GRED: states that it has become increased. States that he can have it before. States it has been over the past couple of months. States that he is taking his protonix and been also taking pepcid at night. States that his last meal is approx 4-5pm but does not go to bed until 11-12 pm. States that it feels worse in the morning when he wakes up. States that eating does seem to help it      Review of Systems  Constitutional:  Negative for chills and fever.  Respiratory:  Negative for shortness of breath.   Cardiovascular:  Negative for chest pain.  Genitourinary:  Negative for dysuria and hematuria.  Neurological:  Positive for dizziness. Negative for headaches.      Objective:     BP 138/64   Pulse (!) 58   Temp 97.6 F (36.4 C)   Resp 16   Ht 5' 5"$  (1.651 m)   Wt 169 lb (76.7 kg)   SpO2 99%   BMI 28.12 kg/m    Physical Exam Vitals and nursing note reviewed.  Constitutional:      Appearance: Normal appearance.  Cardiovascular:     Rate and Rhythm: Normal rate and regular rhythm.     Heart sounds: Normal heart sounds.  Pulmonary:     Effort:  Pulmonary effort is normal.     Breath sounds: Normal breath sounds.  Chest:     Chest wall: No tenderness.    Neurological:     Mental Status: He is alert.      No results found for any visits on 06/19/22.    The ASCVD Risk score (Arnett DK, et al., 2019) failed to calculate for the following reasons:   The 2019 ASCVD risk score is only valid for ages 67 to 72    Assessment & Plan:   Problem List Items Addressed This Visit       Cardiovascular and Mediastinum   Hypertension - Primary    Recently seen in the emergency department for hypertensive urgency.  They did start him back on his amlodipine.  He has been tolerating the medication well with some dizziness.  States he has checked blood pressure at home and has been better blood pressure within normal limits here today.  Continue amlodipine 5 mg along with carvedilol and lisinopril.      Relevant Medications   amLODipine (NORVASC) 5 MG tablet     Digestive   Gastroesophageal reflux disease    Patient currently maintained on Protonix 40 mg and Pepcid 20 mg nightly.  Did encourage patient to do little snack before bed to see if this will help with the acid production and is feeling in the mornings.  He continue taking the Protonix in the morning and Pepcid 20 mg at night.        Other   Breast pain, left    Ambiguous presentation.Breast exam benign in office.  Continue taking Tylenol over-the-counter.  Continue to monitor       Return if symptoms worsen or fail to improve, for As scheduled .    Romilda Garret, NP

## 2022-06-19 NOTE — Assessment & Plan Note (Signed)
Patient currently maintained on Protonix 40 mg and Pepcid 20 mg nightly.  Did encourage patient to do little snack before bed to see if this will help with the acid production and is feeling in the mornings.  He continue taking the Protonix in the morning and Pepcid 20 mg at night.

## 2022-06-19 NOTE — Patient Instructions (Addendum)
Nice to see you today I want you to keep your appointment with me in April, sooner If you need me Try doing a snack maybe around 8pm if bedtime is 12.

## 2022-06-19 NOTE — Assessment & Plan Note (Signed)
Ambiguous presentation.Breast exam benign in office.  Continue taking Tylenol over-the-counter.  Continue to monitor

## 2022-06-19 NOTE — Assessment & Plan Note (Signed)
Recently seen in the emergency department for hypertensive urgency.  They did start him back on his amlodipine.  He has been tolerating the medication well with some dizziness.  States he has checked blood pressure at home and has been better blood pressure within normal limits here today.  Continue amlodipine 5 mg along with carvedilol and lisinopril.

## 2022-06-20 NOTE — Telephone Encounter (Signed)
He saw his primary care physician yesterday regarding this.  He seems to be doing fine with amlodipine which should be continued.

## 2022-07-11 ENCOUNTER — Emergency Department
Admission: EM | Admit: 2022-07-11 | Discharge: 2022-07-11 | Disposition: A | Discharge status: Home / Self Care | Payer: Medicare Other | Attending: Emergency Medicine | Admitting: Emergency Medicine

## 2022-07-11 ENCOUNTER — Encounter: Payer: Self-pay | Admitting: Emergency Medicine

## 2022-07-11 ENCOUNTER — Other Ambulatory Visit: Payer: Self-pay

## 2022-07-11 DIAGNOSIS — Z8673 Personal history of transient ischemic attack (TIA), and cerebral infarction without residual deficits: Secondary | ICD-10-CM | POA: Insufficient documentation

## 2022-07-11 DIAGNOSIS — S51012A Laceration without foreign body of left elbow, initial encounter: Secondary | ICD-10-CM | POA: Insufficient documentation

## 2022-07-11 DIAGNOSIS — W19XXXA Unspecified fall, initial encounter: Secondary | ICD-10-CM | POA: Insufficient documentation

## 2022-07-11 DIAGNOSIS — Z7901 Long term (current) use of anticoagulants: Secondary | ICD-10-CM | POA: Insufficient documentation

## 2022-07-11 DIAGNOSIS — S51019A Laceration without foreign body of unspecified elbow, initial encounter: Secondary | ICD-10-CM

## 2022-07-11 DIAGNOSIS — I2510 Atherosclerotic heart disease of native coronary artery without angina pectoris: Secondary | ICD-10-CM | POA: Insufficient documentation

## 2022-07-11 DIAGNOSIS — I10 Essential (primary) hypertension: Secondary | ICD-10-CM | POA: Diagnosis not present

## 2022-07-11 MED ORDER — LIDOCAINE-EPINEPHRINE-TETRACAINE (LET) SOLUTION
3.0000 mL | Freq: Once | NASAL | Status: AC
  Administered 2022-07-11: 3 mL via TOPICAL
  Filled 2022-07-11 (×2): qty 3

## 2022-07-11 NOTE — ED Triage Notes (Signed)
Patient to ED via POV for laceration on right arm. Patient states he feel three days ago and the skin tore on this arm. Had bandage on arm but keeps reopening wound when removing bandage. Currently wrapped and bleeding controlled.

## 2022-07-11 NOTE — ED Provider Notes (Signed)
[facility] Emergency Department Provider Note     Event Date/Time   First MD Initiated Contact with Patient 07/11/22 1454     (approximate)   History   Laceration   HPI  Cory [name] is a 82 y.o. 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  Patient reports a mechanical fall 3 days prior, resulting in a superficial skin tear.  Since that time he has had difficulty controlling the bleeding to the superficial wound.  Denies any any other serious injury related to the fall.   Physical Exam   Triage Vital Signs: ED Triage Vitals  Enc Vitals Group     BP 07/11/22 1427 (!) 147/68     Pulse Rate 07/11/22 1427 (!) 57     Resp 07/11/22 1427 18     Temp 07/11/22 1427 97.7 F (36.5 C)     Temp Source 07/11/22 1427 Oral     SpO2 07/11/22 1427 98 %     Weight --      Height --      Head Circumference --      Peak Flow --      Pain Score 07/11/22 1428 0     Pain Loc --      Pain Edu? --      Excl. in [place]? --     Most recent vital signs: Vitals:   07/11/22 1427  BP: (!) 147/68  Pulse: (!) 57  Resp: 18  Temp: 97.7 F (36.5 C)  SpO2: 98%    General Awake, no distress. NAD CV:  Good peripheral perfusion.  RESP:  Normal effort.  ABD:  No distention.  MSK:  Normal active range of motion of the right upper extremity.  No elbow effusion, dislocation, or joint dysfunction.  Patient with a 3 x 3 cm skin tear noted with some active bleeding appreciated.   ED Results / Procedures / Treatments   Labs (all labs ordered are listed, but only abnormal results are displayed) Labs Reviewed - No data to display   EKG   RADIOLOGY   No results found.   PROCEDURES:  Critical Care performed: No  Procedures Wound care  LET applied topically Wound cleansed with saline. Skin approximated to the best of my ability. Vaseline gauze applied Telfa applied Tegaderm  applied Tube gauze applied for compression Second applied for ease of dressing change  MEDICATIONS ORDERED IN ED: Medications  lidocaine-EPINEPHrine-tetracaine (LET) solution (3 mLs Topical Given by Other 07/11/22 1546)     IMPRESSION / MDM / ASSESSMENT AND PLAN / ED COURSE  I reviewed the triage vital signs and the nursing notes.                              Differential diagnosis includes, but is not limited to, skin tear elbow abrasion, elbow contusion, elbow hematoma, bursitis  Patient's presentation is most consistent with acute, uncomplicated illness.  Patient's diagnosis is consistent with skin tear with persistent bleeding. Patient will be discharged home with wound care supplies and instructions. Patient is to follow up with primary provider as needed or otherwise directed. Patient is given ED precautions to return to the ED for any worsening or new symptoms.  FINAL CLINICAL IMPRESSION(S) / ED DIAGNOSES   Final diagnoses:  Skin tear of elbow without complication, initial encounter  Rx / DC Orders   ED Discharge Orders     None        Note:  This document was prepared using Dragon voice recognition software and may include unintentional dictation errors.    Melvenia Needles, PA-C 07/11/22 1655    Harvest Dark, MD 07/11/22 1931

## 2022-07-11 NOTE — Discharge Instructions (Signed)
Keep the wound clean, dry, and covered as necessary.  You may keep the dressing applied in the emergency department in place for 48 hours.  Follow-up with the primary provider for any ongoing wound care.

## 2022-07-18 ENCOUNTER — Encounter: Payer: Self-pay | Admitting: Podiatry

## 2022-07-18 ENCOUNTER — Ambulatory Visit (INDEPENDENT_AMBULATORY_CARE_PROVIDER_SITE_OTHER): Payer: Medicare Other | Admitting: Podiatry

## 2022-07-18 VITALS — BP 152/70 | HR 60

## 2022-07-18 DIAGNOSIS — M79674 Pain in right toe(s): Secondary | ICD-10-CM

## 2022-07-18 DIAGNOSIS — M79675 Pain in left toe(s): Secondary | ICD-10-CM | POA: Diagnosis not present

## 2022-07-18 DIAGNOSIS — B351 Tinea unguium: Secondary | ICD-10-CM | POA: Diagnosis not present

## 2022-07-18 DIAGNOSIS — E119 Type 2 diabetes mellitus without complications: Secondary | ICD-10-CM

## 2022-07-18 NOTE — Progress Notes (Signed)
This patient returns to my office for at risk foot care.  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  This patient is unable to cut nails himself since the patient cannot reach his nails.These nails are painful walking and wearing shoes.  This patient presents for at risk foot care today.  General Appearance  Alert, conversant and in no acute stress.  Vascular  Dorsalis pedis and posterior tibial  pulses are palpable  bilaterally.  Capillary return is within normal limits  bilaterally. Temperature is within normal limits  bilaterally.  Neurologic  Senn-Weinstein monofilament wire test within normal limits  bilaterally. Muscle power within normal limits bilaterally.  Nails Thick disfigured discolored nails with subungual debris  from hallux to fifth toes right foot. No evidence of bacterial infection or drainage bilaterally.  Orthopedic .  No crepitus or effusions noted.  No bony pathology or digital deformities noted. No rearfoot motion STJ and ankle  right foot. Abnormal positioning third toe right foot.  Skin  normotropic skin with no porokeratosis noted bilaterally.  No signs of infections or ulcers noted.     Onychomycosis  Pain in right toes  Pain in left toes  Consent was obtained for treatment procedures.   Mechanical debridement of nails 1-5  bilaterally performed with a nail nipper.  Filed with dremel without incident.    Return office visit   3 months                   Told patient to return for periodic foot care and evaluation due to potential at risk complications.   Jazlynne Milliner DPM   

## 2022-07-19 ENCOUNTER — Telehealth: Payer: Self-pay | Admitting: *Deleted

## 2022-07-19 NOTE — Telephone Encounter (Signed)
     Patient  visit on 07/11/2022  at [facility] was for treatment   Have you been able to follow up with your primary care physician?Wound is good but will see the pcp next week   The patient was able to obtain any needed medicine or equipment.  Are there diet recommendations that you are having difficulty following?  Patient expresses understanding of discharge instructions and education provided has no other needs at this time.   .[place], Population [facility] [phone] [address] , [place] [address] Email : [email or web address]

## 2022-07-24 ENCOUNTER — Ambulatory Visit: Payer: Medicare Other | Admitting: Dermatology

## 2022-08-12 ENCOUNTER — Telehealth: Payer: Self-pay | Admitting: Pharmacist

## 2022-08-12 DIAGNOSIS — E1165 Type 2 diabetes mellitus with hyperglycemia: Secondary | ICD-10-CM

## 2022-08-12 DIAGNOSIS — I10 Essential (primary) hypertension: Secondary | ICD-10-CM

## 2022-08-12 DIAGNOSIS — I5032 Chronic diastolic (congestive) heart failure: Secondary | ICD-10-CM

## 2022-08-12 NOTE — Telephone Encounter (Signed)
PharmD reviewed patient chart to assess eligibility for [facility] services. Patient was determined to be a good candidate for the program given the complexity of the medication regimen and overall risk for hospitalization and/or high healthcare utilization.   Referral entered in order to outreach patient and offer appointment with PharmD. Referral cosigned to PCP.  

## 2022-08-21 ENCOUNTER — Telehealth: Payer: Self-pay

## 2022-08-21 NOTE — Progress Notes (Signed)
Care Management & Coordination Services Pharmacy Team  Reason for Encounter: Appointment Reminder  Contacted patient to confirm in office appointment with Cory [name] , PharmD on 08/28/22 at 11:00. Reminded patient of location [facility].  Spoke with patient on 08/21/2022   Do you have any problems getting your medications? No  Patient reports using [employer] mail order for most medications with no issues and local [facility] for short dose medication.  What is your top health concern you would like to discuss at your upcoming visit?  Cory [name] is expensive   Have you seen any other providers since your last visit with PCP? Yes- neurology,podiatry   Chart review:  Recent office visits:  06/19/22-Cory Cable,NP(PCP)-hospital f/u,no medication changes 05/28/22-Cory Cable,NP(PCP)-f/u DM,labs,A1c 5.8,discontinue metformin altogether,Will reach out to cardiology to see if he can discontinue amlodipine 2.5 mg altogether. F/u 3 months 02/25/22- Cory Cable,NP(PCP)- TOC,labs(ua normal)A1c 6.3,discontinue metformin 500 mg daily. (CVA note unclear on atorvastatin)f/u 3 months   Recent consult visits:  08/06/22-Cory Shah,MD(neruo)-f/u no medication changes f/u 4 months 07/18/22-Cory [name],DPM-toenail trimming,f/u 3 months 06/13/22-Cory Chasnis,DO- f/u lumbar pain, no medication changes, f/u 7-8 weeks 06/06/22-Cory Paich,PA(neuro)-f/u congnitive impairment, no medication changes f/u 2 months  05/20/22-Cory Kowalski,MD(derm)-f/u AK,start fluorouracil cream twice daily for 7 days. F/u 2 months 04/11/22-Cory Chasnis,DO-lumbar pain, start PT,f/u 8 weeks 04/04/22-Cory [name],DPM-toenail trimming,f/u 3 months  04/02/22-Cory Arida,MD(cardio)-f/u PAD,f/u 6 months  Hospital visits:  None in previous 6 months 07/11/22-[place] at [facility] - skin tear, no admission  06/11/22-[place] at [place]- HTN urgency,advised to restart all hypertensive medications and f/u cardiology, no admission   Star Rating Drugs:   Medication:  Last Fill: Day Supply    [employer] mail order Atorvastatin  01/16/22 90 Lisinopril  05/10/22 90  Care Gaps: Annual wellness visit in last year? Yes  If Diabetic: Last eye exam / retinopathy screening:never done Last diabetic foot exam:UTD   Cory [name], PharmD notified  Cory [name], [facility] Clinical Pharmacy Assistant [phone]

## 2022-08-27 ENCOUNTER — Ambulatory Visit (INDEPENDENT_AMBULATORY_CARE_PROVIDER_SITE_OTHER): Payer: Medicare Other | Admitting: Nurse Practitioner

## 2022-08-27 ENCOUNTER — Encounter: Payer: Self-pay | Admitting: Nurse Practitioner

## 2022-08-27 VITALS — BP 124/64 | HR 60 | Temp 98.2°F | Resp 16 | Ht 65.0 in | Wt 171.4 lb

## 2022-08-27 DIAGNOSIS — K449 Diaphragmatic hernia without obstruction or gangrene: Secondary | ICD-10-CM

## 2022-08-27 DIAGNOSIS — I10 Essential (primary) hypertension: Secondary | ICD-10-CM

## 2022-08-27 DIAGNOSIS — E1165 Type 2 diabetes mellitus with hyperglycemia: Secondary | ICD-10-CM | POA: Diagnosis not present

## 2022-08-27 LAB — POCT GLYCOSYLATED HEMOGLOBIN (HGB A1C): Hemoglobin A1C: 6.2 % — AB (ref 4.0–5.6)

## 2022-08-27 MED ORDER — PANTOPRAZOLE SODIUM 40 MG PO TBEC: 40.0000 mg | DELAYED_RELEASE_TABLET | Freq: Every day | ORAL | 3 refills | Status: DC

## 2022-08-27 NOTE — Assessment & Plan Note (Signed)
Currently maintained on lifestyle modifications slowly.  A1c within normal limits continue with lifestyle modifications only  Lab Results  Component Value Date   HGBA1C 6.2 (A) 08/27/2022

## 2022-08-27 NOTE — Assessment & Plan Note (Signed)
Patient currently maintained on amlodipine 5 mg, lisinopril 20 mg and carvedilol 6.25 mg.  Blood pressure within normal limits.  Continue taking medication as prescribed.  Denies lightheadedness or dizziness or recent falls.

## 2022-08-27 NOTE — Assessment & Plan Note (Signed)
Patient needed refill on Protonix .  Refill provided

## 2022-08-27 NOTE — Progress Notes (Signed)
Established Patient Office Visit  Subjective   Patient ID: Cory [name], male    DOB: [birth year]  Age: 82 y.o. MRN: [id number]  Chief Complaint  Patient presents with   Diabetes   Blood Pressure Check      DM2: Patient currently maintained on diet and lifestyle modifications only.  Patient was on metformin in the past but we did discontinue this at last diabetes follow-up. Patient states he feels he is doing well.  Does not check glucose at home.  States he does not feel like he has had wide swings in his sugar  Hypertension: Patient currently maintained on amlodipine 5 mg, carvedilol 6.25 mg twice daily and lisinopril 20.  Patient is taking medications as prescribed tolerating well he denies dizziness or lightheadedness.  No recent falls either  Hx of stroke: states that he had a history of a CVA approximately 10 years ago.  Patient does endorse that he had word finding difficulty since the stroke but seems to be increasing as of late.   Review of Systems  Constitutional:  Negative for chills and fever.  Respiratory:  Negative for shortness of breath.   Cardiovascular:  Negative for chest pain.  Neurological:  Negative for dizziness and headaches.      Objective:     BP 124/64   Pulse 60   Temp 98.2 F (36.8 C)   Resp 16   Ht 5\' 5"  (1.651 m)   Wt 171 lb 6 oz (77.7 kg)   SpO2 98%   BMI 28.52 kg/m  BP Readings from Last 3 Encounters:  08/27/22 124/64  07/18/22 (!) 152/70  07/11/22 (!) 147/68   Wt Readings from Last 3 Encounters:  08/27/22 171 lb 6 oz (77.7 kg)  06/19/22 169 lb (76.7 kg)  06/11/22 167 lb (75.8 kg)      Physical Exam Vitals and nursing note reviewed.  Constitutional:      Appearance: Normal appearance.     Comments: Using a cane in office  Cardiovascular:     Rate and Rhythm: Normal rate and regular rhythm.     Heart sounds: Normal heart sounds.  Pulmonary:     Effort: Pulmonary effort is normal.     Breath sounds: Normal breath  sounds.  Neurological:     Mental Status: He is alert.      Results for orders placed or performed in visit on 08/27/22  POCT glycosylated hemoglobin (Hb A1C)  Result Value Ref Range   Hemoglobin A1C 6.2 (A) 4.0 - 5.6 %   HbA1c POC (<> result, manual entry)     HbA1c, POC (prediabetic range)     HbA1c, POC (controlled diabetic range)        The ASCVD Risk score (Arnett DK, et al., 2019) failed to calculate for the following reasons:   The 2019 ASCVD risk score is only valid for ages 69 to 32   The patient has a prior MI or stroke diagnosis    Assessment & Plan:   Problem List Items Addressed This Visit       Cardiovascular and Mediastinum   Hypertension    Patient currently maintained on amlodipine 5 mg, lisinopril 20 mg and carvedilol 6.25 mg.  Blood pressure within normal limits.  Continue taking medication as prescribed.  Denies lightheadedness or dizziness or recent falls.        Respiratory   Hiatal hernia    Patient needed refill on Protonix .  Refill provided  Relevant Medications   pantoprazole (PROTONIX) 40 MG tablet     Endocrine   Type 2 diabetes mellitus with hyperglycemia, without long-term current use of insulin (HCC) - Primary    Currently maintained on lifestyle modifications slowly.  A1c within normal limits continue with lifestyle modifications only  Lab Results  Component Value Date   HGBA1C 6.2 (A) 08/27/2022        Relevant Orders   POCT glycosylated hemoglobin (Hb A1C) (Completed)    Return in about 4 months (around 12/27/2022) for DM recheck.    Audria Nine, NP

## 2022-08-27 NOTE — Patient Instructions (Signed)
Nice to see you today I want to see you in 4 months for a follow up in your sugar levels, sooner if you need me

## 2022-08-28 ENCOUNTER — Ambulatory Visit: Payer: Medicare Other | Admitting: Pharmacist

## 2022-08-28 NOTE — Patient Instructions (Signed)
Visit Information  Phone number for Pharmacist: [phone]  Thank you for meeting with me to discuss your medications! I look forward to working with you to achieve your health care goals. Below is a summary of what we talked about during the visit:  Keep taking your medications as prescribed.  You can take OTC allergy medications if needed (Zyrtec, Allegra, Claritin, etc).  Check your blood pressure a few days a week.  Try to stay hydrated!  Try cutting back on caffeine and see if it improved bladder symptoms.   Al Corpus, PharmD, BCACP Clinical Pharmacist [facility] [phone]

## 2022-08-28 NOTE — Progress Notes (Signed)
Care Management & Coordination Services Pharmacy Note  08/28/2022 Name:  Cory [name] MRN:  [id number] DOB:  November 26, 1940  Summary: Initial OV -HTN: BP 124/64 yesterday, pt is not monitoring at home; he has history of dizziness in the past, leading to amlodipine d/c, leading to ED visit for HTN urgency earlier this year; now appears more stable on current regimen -DM: A1c 6.2% (07/2022) at goal without medication; reviewed A1c goals/cutoffs with patient -OAB: pt is on Gemtesa but cost is high; unfortunately no assistance is available; advised trial off med to assess efficacy, pt can decide if it is worth it to him to refill  Recommendations/Changes made from today's visit: -Advised to monitor BP 2-3 times per week; goal BP < 110/60 - 140/90; discussed fall prevention given h/o dizziness -Discussed limiting caffeine to improve urinary urgency  Follow up plan: -Health Concierge will call patient 1 month for BP update -Pharmacist follow up televisit scheduled for 6 months -Cardiology 10/11/22; Urology 11/14/22; PCP 12/27/22    Subjective: Cory [name] is an 82 y.o. year old male who is a primary patient of Aarian, Griffie, NP.  The care coordination team was consulted for assistance with disease management and care coordination needs.    Engaged with patient face to face for initial visit. Patient lives at home with his wife of ~30 years. He is main caregiver for her as she has dementia. [place] to [place] in [facility] but moved here a few years ago to be closer to family (his daughter and grandson live close by).   Recent office visits: 08/27/22 NP Matt Cable OV: f/u - A1c 6.2%; no changes. RTC 4 months.  06/19/22 NP Audria Nine OV: hospital f/u - BP 138/64. pt has restarted amlodipine, w/ some dizziness. No changes.  05/28/22 NP Matt Toney Reil OV: f/u - A1c 5.8%; d/c metformin.  Recent consult visits: 08/06/22 PA Paich (Neurology): hx stroke, cognitive impairment. Continue DAPT.    06/06/22 PA Paich (Neurology): lightheadedness - no improvement with d/c of amlodipine.   04/02/22 Dr Kirke Corin (Cardiology): PAD, CAD. Denies claudication. Decrease amlodipine to 2.5 mg given orthostatic sx.  Hospital visits: 07/11/22 ED visit [facility]): skin tear, mechanical fall 3 days prior. Discharged w/ wound care supplies.  06/11/22 ED visit [facility]): hypertensive urgency - recently amlodipine was held d/t intermittent dizziness. BP 190/102 in ED, no symptoms. Given 2 doses of clonidin. Restart amlodipine and f/u with cardiology.   Objective:  Lab Results  Component Value Date   CREATININE 1.19 06/11/2022   BUN 17 06/11/2022   GFR 58.41 (L) 05/28/2022   GFRNONAA >60 06/11/2022   GFRAA 98 03/13/2020   NA 135 06/11/2022   K 4.0 06/11/2022   CALCIUM 8.8 (L) 06/11/2022   CO2 25 06/11/2022   GLUCOSE 94 06/11/2022    Lab Results  Component Value Date/Time   HGBA1C 6.2 (A) 08/27/2022 11:49 AM   HGBA1C 5.8 (A) 05/28/2022 11:54 AM   HGBA1C 6.5 05/01/2021 12:11 PM   HGBA1C 6.5 01/29/2021 03:30 PM   GFR 58.41 (L) 05/28/2022 12:20 PM   GFR 51.99 (L) 02/25/2022 02:57 PM   MICROALBUR 0.9 02/26/2022 02:57 PM    Last diabetic Eye exam: No results found for: "HMDIABEYEEXA"  Last diabetic Foot exam: No results found for: "HMDIABFOOTEX"   Lab Results  Component Value Date   CHOL 120 01/29/2021   HDL 44.20 01/29/2021   LDLCALC 56 05/07/2020   LDLDIRECT 51.0 01/29/2021   TRIG 206.0 (H) 01/29/2021  CHOLHDL 3 01/29/2021       Latest Ref Rng & Units 06/11/2022    5:34 PM 02/25/2022    2:57 PM 10/23/2021    2:49 AM  Hepatic Function  Total Protein 6.5 - 8.1 g/dL 6.7  6.5  5.8   Albumin 3.5 - 5.0 g/dL 3.8  3.8  2.5   AST 15 - 41 U/L 27  18  38   ALT 0 - 44 U/L 19  19  26    Alk Phosphatase 38 - 126 U/L 43  42  53   Total Bilirubin 0.3 - 1.2 mg/dL 0.4  0.4  0.7     Lab Results  Component Value Date/Time   TSH 1.80 05/01/2021 12:11 PM   TSH 1.860 03/13/2020 11:23 AM       Latest  Ref Rng & Units 06/11/2022    5:34 PM 02/25/2022    2:57 PM 10/31/2021    3:53 PM  CBC  WBC 4.0 - 10.5 K/uL 6.1  10.4  5.8   Hemoglobin 13.0 - 17.0 g/dL 16.1  09.6  04.5   Hematocrit 39.0 - 52.0 % 32.1  35.5  32.1   Platelets 150 - 400 K/uL 199  206.0  348.0     Lab Results  Component Value Date/Time   VITAMINB12 940 (H) 05/01/2021 12:11 PM    Clinical ASCVD: Yes  The ASCVD Risk score (Arnett DK, et al., 2019) failed to calculate for the following reasons:   The 2019 ASCVD risk score is only valid for ages 79 to 17   The patient has a prior MI or stroke diagnosis        08/27/2022   11:47 AM 05/28/2022   11:53 AM 02/12/2022    3:58 PM  Depression screen PHQ 2/9  Decreased Interest 0 0 0  Down, Depressed, Hopeless 0 1 0  PHQ - 2 Score 0 1 0  Altered sleeping 1 1   Tired, decreased energy 0 1   Change in appetite 0 1   Feeling bad or failure about yourself  0 0   Trouble concentrating 0 0   Moving slowly or fidgety/restless 0 0   Suicidal thoughts 0 0   PHQ-9 Score 1 4   Difficult doing work/chores Not difficult at all Not difficult at all      Social History   Tobacco Use  Smoking Status Former   Types: Pipe  Smokeless Tobacco Never   BP Readings from Last 3 Encounters:  08/27/22 124/64  07/18/22 (!) 152/70  07/11/22 (!) 147/68   Pulse Readings from Last 3 Encounters:  08/27/22 60  07/18/22 60  07/11/22 (!) 57   Wt Readings from Last 3 Encounters:  08/27/22 171 lb 6 oz (77.7 kg)  06/19/22 169 lb (76.7 kg)  06/11/22 167 lb (75.8 kg)   BMI Readings from Last 3 Encounters:  08/27/22 28.52 kg/m  06/19/22 28.12 kg/m  06/11/22 27.79 kg/m    Allergies  Allergen Reactions   Fesoterodine Other (See Comments)    Dry mouth and constipation   Isosorbide Other (See Comments)   Grass Pollen(K-O-R-T-Swt Vern) Other (See Comments)   Hydrocodone Anxiety   Meperidine Other (See Comments) and Rash    Other reaction(s): Other (See Comments) HALLUCINATIONS     Penicillins Rash    Medications Reviewed Today     Reviewed by Kathyrn Sheriff, [facility] (Pharmacist) on 08/28/22 at 1219  Med List Status: <None>   Medication Order Taking? Sig Documenting Provider  Last Dose Status Informant  acetaminophen (TYLENOL) 500 MG tablet [id number] Yes Take 1,000 mg by mouth at bedtime. [provider] Taking Active   amLODipine (NORVASC) 5 MG tablet [id number] Yes Take 5 mg by mouth daily. [provider] Taking Active   aspirin EC 81 MG tablet [id number] Yes Take 1 tablet (81 mg total) by mouth daily. Swallow whole. [place], Peter M, MD Taking Active   atorvastatin (LIPITOR) 40 MG tablet [id number] Yes TAKE 1 TABLET EVERY Burnice Logan, MD Taking Active   carvedilol (COREG) 6.25 MG tablet [id number] Yes TAKE 1 TABLET TWICE DAILY (STOP CLONIDINE) Iran Ouch, MD Taking Active   clopidogrel (PLAVIX) 75 MG tablet [id number] Yes TAKE 1 TABLET EVERY DAY Gweneth Dimitri, MD Taking Active   cycloSPORINE (RESTASIS) 0.05 % ophthalmic emulsion [id number] Yes 1 drop 2 (two) times a day. [provider] Taking Active   diclofenac Sodium (VOLTAREN) 1 % GEL [id number] Yes Apply 2 g topically as needed. [provider] Taking Active   escitalopram (LEXAPRO) 10 MG tablet [id number] Yes Take 10 mg by mouth daily. [provider] Taking Active   famotidine (PEPCID) 20 MG tablet [id number] Yes Take 20 mg by mouth at bedtime. [provider] Taking Active   fluticasone (FLONASE) 50 MCG/ACT nasal spray [id number] Yes USE 1 SPRAY IN BOTH NOSTRILS DAILY Gweneth Dimitri, MD Taking Active   GEMTESA 75 MG TABS [id number] Yes Take 1 tablet by mouth daily. Michiel Cowboy A, PA-C Taking Active   latanoprost (XALATAN) 0.005 % ophthalmic solution [id number] Yes daily. [provider] Taking Active   lisinopril (ZESTRIL) 20 MG tablet [id number] Yes Take 20 mg by mouth daily. [provider] Taking Active   melatonin 5 MG TABS  [id number] Yes Take 5 mg by mouth at bedtime. [provider] Taking Active Self  montelukast (SINGULAIR) 10 MG tablet [id number] Yes TAKE 1 TABLET (10 MG TOTAL) BY MOUTH AT BEDTIME. Gweneth Dimitri, MD Taking Active   nitroGLYCERIN (NITROSTAT) 0.4 MG SL tablet [id number] Yes Place 1 tablet (0.4 mg total) under the tongue every 5 (five) minutes as needed. Gweneth Dimitri, MD Taking Active   pantoprazole (PROTONIX) 40 MG tablet [id number] Yes Take 1 tablet (40 mg total) by mouth daily. Eden Emms, NP Taking Active   tamsulosin [facility]) 0.4 MG CAPS capsule [id number] Yes Take 1 capsule (0.4 mg total) by mouth daily. Harle Battiest, PA-C Taking Active   traMADol (ULTRAM) 50 MG tablet [id number] Yes Take 50 mg by mouth every 6 (six) hours as needed. [provider] Taking Active             SDOH:  (Social Determinants of Health) assessments and interventions performed: No SDOH Interventions    Flowsheet Row Clinical Support from 02/12/2022 in [facility]  SDOH Interventions   Food Insecurity Interventions Intervention Not Indicated  Housing Interventions Intervention Not Indicated  Transportation Interventions Intervention Not Indicated  Utilities Interventions Intervention Not Indicated  Alcohol Usage Interventions Intervention Not Indicated (Score <7)  Financial Strain Interventions Intervention Not Indicated  Physical Activity Interventions Intervention Not Indicated  Stress Interventions Intervention Not Indicated  Social Connections Interventions Intervention Not Indicated       Medication Assistance: None required.  Patient affirms current coverage meets needs.  Medication Access: Within the past 30 days, how often has patient missed a dose of medication? 0 Is a pillbox or other method used to improve adherence? No  Factors that may affect medication  adherence? no barriers identified Are meds synced by current pharmacy? No   Are meds delivered by current pharmacy? Yes  Does patient experience delays in picking up medications due to transportation concerns? No   [facility] Services Reviewed: Is patient disadvantaged to use [facility]?: Yes  Current Rx insurance plan: [employer] PDP Name and location of Current pharmacy:  [facility] [phone] [place] - [address] [place] [address] Phone: [phone] Fax: [phone]  [facility] - [place], [place] - [address] [place] [address] Phone: [phone] Fax: [phone]  [facility] [address] [place] [address] Phone: [phone] Fax: [phone]  [facility] - [place], [place] - [address] [place] [address] Phone: [phone] Fax: [phone]  [facility] services reviewed with patient today?: No  Patient requests to transfer care to [facility] Pharm[facility]?: No  Reason patient declined to change pharmacies: Disadvantaged due to insurance/mail order  Compliance/Adherence/Medication fill history: Care Gaps: Eye exam (never done)  Star-Rating Drugs: Atorvastatin - PDC 28%; LF 01/16/22 x 90 ds Lisinopril - PDC 62%; LF 05/10/22 x 90 ds; 11/29/21 X 90 DS   Assessment/Plan  Heart Failure / HTN (Goal: BP < 140/90; maintain dry weight) -Controlled - BP 124/64 yesterday; he is not monitoring at home; he has had issues with dizziness in the past, leading to amlodipine d/c, leading to ED visit for HTN urgency -Current home BP/HR readings: n/a -Current home daily weights: n/a -Last ejection fraction: 55-60% (Date: 09/2021) -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%); per cardiology HF diagnosis is not currently medically relevant as pt had fluid buildup in s/o sepsis which has resolved -Current treatment: Amlodipine 5 mg daily - Appropriate, Effective, Safe, Accessible Carvedilol 6.25 mg BID -  Appropriate, Effective, Safe, Accessible Lisinopril 20 mg daily - Appropriate, Effective, Safe, Accessible -Medications previously tried: n/a  -Educated on Benefits of medications for managing symptoms and prolonging life; Importance of blood pressure control -Educated on Importance of home blood pressure monitoring; Symptoms of hypotension and importance of maintaining adequate hydration; Reviewed fall risk at length given history of dizziness -Counseled to monitor BP at home 2-3x weekly, document, and provide log at future appointments -Recommended to continue current medication  Hyperlipidemia / CAD (LDL goal < 70) -Controlled - LDL 51 (01/2021) at goal -Hs stroke; hx CAD - CABG 2000 -Current treatment: Aspirin 81 mg daily - Appropriate, Effective, Safe, Accessible Clopidogrel 75 mg daily -Appropriate, Effective, Safe, Accessible Atorvastatin 40 mg daily -Appropriate, Effective, Safe, Accessible Nitroglycerin 0.4 mg SL prn -Appropriate, Effective, Safe, Accessible -Medications previously tried: n/a  -Educated on Cholesterol goals; Benefits of statin for ASCVD risk reduction; importance of antiplatelet therapy; discussed risk for bleeding with DAPT -Recommended to continue current medication  Diabetes (A1c goal <7%) -Controlled - A1c 6.2% (07/2022) at goal without medication -Current medications: None -Medications previously tried: metformin  -Educated on A1c and blood sugar goals; -Per chart review pt reported eye exam in 2023 @ [facility] - requested records -Counseled on diet and exercise extensively  Depression/Anxiety (Goal: manage symptoms) -Controlled -PHQ9: 1 (minimal depression) -Connected with Neurology for mental health support -Current treatment: Escitalopram 10 mg daily - Appropriate, Effective, Safe, Accessible -Medications previously tried/failed: n/a -Educated on Benefits of medication for symptom control -Recommended to continue current  medication  Health Maintenance -Vaccine gaps: none -GERD: on pantoprazole 40 mg, famotidine  20 mg -BPH / OAB: on tamsulosin, Gemtesa ($$); discussed role of each medication at length, pt will run out of Gemtesa soon; discussed monitoring symptoms closely to see if it will be worth it to refill for high cost; also discussed reducing caffeine to improve urinary urgency -Pain: Tylenol, tramadol, voltaren gel -Allergies: montelukast, flonase -Glaucoma: latanoprost -Sleep: melatonin   Al Corpus, PharmD, P[place]y [place], CPP Clinical Pharmacist Practitioner [facility] [phone]

## 2022-09-02 ENCOUNTER — Telehealth: Payer: Self-pay | Admitting: Nurse Practitioner

## 2022-09-02 NOTE — Telephone Encounter (Signed)
Got notice from [facility] that patient is diet controlled on his diabetes. That is correct but still want patient to have a detailed eye exam yearly if they can send that to the office please

## 2022-09-03 NOTE — Telephone Encounter (Signed)
[facility] is sending over the pt last office visit with them.

## 2022-09-03 NOTE — Telephone Encounter (Signed)
Left message to return call to our office.  

## 2022-09-04 ENCOUNTER — Encounter: Payer: Self-pay | Admitting: Nurse Practitioner

## 2022-09-04 ENCOUNTER — Other Ambulatory Visit: Payer: Self-pay | Admitting: Urology

## 2022-09-05 MED ORDER — NITROGLYCERIN 0.4 MG SL SUBL: 0.4000 mg | SUBLINGUAL_TABLET | SUBLINGUAL | 0 refills | Status: DC | PRN

## 2022-10-03 ENCOUNTER — Telehealth: Payer: Self-pay

## 2022-10-03 NOTE — Progress Notes (Unsigned)
Care Management & Coordination Services Pharmacy Team  Reason for Encounter: Hypertension  Contacted patient to discuss hypertension disease state. {US HC Outreach:28874}    Current antihypertensive regimen:  Amlodipine 5 mg daily   Carvedilol 6.25 mg BID  Lisinopril 20 mg daily    Patient verbally confirms he is taking the above medications as directed. {yes/no:20286}  How often are you checking your Blood Pressure? {CHL HP BP Monitoring Frequency:[phone]}  he checks his blood pressure {timing:25218} {before/after:25217} taking his medication.  Current home BP readings:   DATE:             BP               PULSE   Wrist or arm cuff: Caffeine intake: Salt intake: OTC medications including pseudoephedrine or NSAIDs?  aspirin  Any readings above 180/100? {yes/no:20286} If yes any symptoms of hypertensive emergency? {hypertensive emergency symptoms:25354}  What recent interventions/DTPs have been made by any provider to improve Blood Pressure control since last CPP Visit: ***  Any recent hospitalizations or ED visits since last visit with CPP? {yes/no:20286}  What diet changes have been made to improve Blood Pressure Control?  ***  What exercise is being done to improve your Blood Pressure Control?  ***  Adherence Review: Is the patient currently on ACE/ARB medication? Yes Does the patient have >5 day gap between last estimated fill dates? {yes/no:20286}  Star Rating Drugs:  Medication:  Last Fill: Day Supply Lisinopril 20mg   05/10/22 90 [employer] [facility]  Chart Updates: Recent office visits:  ***  Recent consult visits:  ***  Hospital visits:  {Hospital DC Yes/No:[id number]}  Medications: Outpatient Encounter Medications as of 10/03/2022  Medication Sig   acetaminophen (TYLENOL) 500 MG tablet Take 1,000 mg by mouth at bedtime.   amLODipine (NORVASC) 5 MG tablet Take 5 mg by mouth daily.   aspirin EC 81 MG tablet Take 1 tablet (81 mg total) by mouth daily.  Swallow whole.   atorvastatin (LIPITOR) 40 MG tablet TAKE 1 TABLET EVERY DAY   carvedilol (COREG) 6.25 MG tablet TAKE 1 TABLET TWICE DAILY (STOP CLONIDINE)   clopidogrel (PLAVIX) 75 MG tablet TAKE 1 TABLET EVERY DAY   cycloSPORINE (RESTASIS) 0.05 % ophthalmic emulsion 1 drop 2 (two) times a day.   diclofenac Sodium (VOLTAREN) 1 % GEL Apply 2 g topically as needed.   escitalopram (LEXAPRO) 10 MG tablet Take 10 mg by mouth daily.   famotidine (PEPCID) 20 MG tablet Take 20 mg by mouth at bedtime.   fluticasone (FLONASE) 50 MCG/ACT nasal spray USE 1 SPRAY IN BOTH NOSTRILS DAILY   GEMTESA 75 MG TABS Take 1 tablet by mouth daily.   latanoprost (XALATAN) 0.005 % ophthalmic solution daily.   lisinopril (ZESTRIL) 20 MG tablet Take 20 mg by mouth daily.   melatonin 5 MG TABS Take 5 mg by mouth at bedtime.   montelukast (SINGULAIR) 10 MG tablet TAKE 1 TABLET (10 MG TOTAL) BY MOUTH AT BEDTIME.   nitroGLYCERIN (NITROSTAT) 0.4 MG SL tablet Place 1 tablet (0.4 mg total) under the tongue every 5 (five) minutes as needed.   pantoprazole (PROTONIX) 40 MG tablet Take 1 tablet (40 mg total) by mouth daily.   tamsulosin (FLOMAX) 0.4 MG CAPS capsule TAKE 1 CAPSULE EVERY DAY   traMADol (ULTRAM) 50 MG tablet Take 50 mg by mouth every 6 (six) hours as needed.   No facility-administered encounter medications on file as of 10/03/2022.    Recent Office Vitals: BP Readings from Last  3 Encounters:  08/27/22 124/64  07/18/22 (!) 152/70  07/11/22 (!) 147/68   Pulse Readings from Last 3 Encounters:  08/27/22 60  07/18/22 60  07/11/22 (!) 57    Wt Readings from Last 3 Encounters:  08/27/22 171 lb 6 oz (77.7 kg)  06/19/22 169 lb (76.7 kg)  06/11/22 167 lb (75.8 kg)     Kidney Function Lab Results  Component Value Date/Time   CREATININE 1.19 06/11/2022 05:34 PM   CREATININE 1.16 05/28/2022 12:20 PM   GFR 58.41 (L) 05/28/2022 12:20 PM   GFRNONAA >60 06/11/2022 05:34 PM   GFRAA 98 03/13/2020 11:23 AM        Latest Ref Rng & Units 06/11/2022    5:34 PM 05/28/2022   12:20 PM 02/25/2022    2:57 PM  BMP  Glucose 70 - 99 mg/dL 94  81  130   BUN 8 - 23 mg/dL 17  16  19    Creatinine 0.61 - 1.24 mg/dL 8.65  7.84  6.96   Sodium 135 - 145 mmol/L 135  135  131   Potassium 3.5 - 5.1 mmol/L 4.0  5.2  4.7   Chloride 98 - 111 mmol/L 102  101  98   CO2 22 - 32 mmol/L 25  28  26    Calcium 8.9 - 10.3 mg/dL 8.8  9.0  9.1      Al Corpus, PharmD notified  Burt Knack, [facility] Clinical Pharmacy Assistant [phone]

## 2022-10-11 ENCOUNTER — Encounter: Payer: Self-pay | Admitting: Cardiovascular Disease

## 2022-10-11 ENCOUNTER — Ambulatory Visit: Payer: Medicare Other | Attending: Cardiovascular Disease | Admitting: Cardiovascular Disease

## 2022-10-11 VITALS — BP 120/58 | HR 58 | Ht 65.0 in | Wt 173.4 lb

## 2022-10-11 DIAGNOSIS — I739 Peripheral vascular disease, unspecified: Secondary | ICD-10-CM | POA: Insufficient documentation

## 2022-10-11 DIAGNOSIS — E785 Hyperlipidemia, unspecified: Secondary | ICD-10-CM

## 2022-10-11 DIAGNOSIS — I10 Essential (primary) hypertension: Secondary | ICD-10-CM | POA: Insufficient documentation

## 2022-10-11 DIAGNOSIS — I779 Disorder of arteries and arterioles, unspecified: Secondary | ICD-10-CM | POA: Insufficient documentation

## 2022-10-11 DIAGNOSIS — I25810 Atherosclerosis of coronary artery bypass graft(s) without angina pectoris: Secondary | ICD-10-CM | POA: Diagnosis not present

## 2022-10-11 NOTE — Patient Instructions (Signed)
Medication Instructions:  No changes *If you need a refill on your cardiac medications before your next appointment, please call your pharmacy*   Lab Work: None ordered If you have labs (blood work) drawn today and your tests are completely normal, you will receive your results only by: MyChart Message (if you have MyChart) OR A paper copy in the mail If you have any lab test that is abnormal or we need to change your treatment, we will call you to review the results.   Testing/Procedures: Your physician has requested that you have a carotid duplex in December. This test is an ultrasound of the carotid arteries in your neck. It looks at blood flow through these arteries that supply the brain with blood.   Allow one hour for this exam.  There are no restrictions or special instructions.  This will take place at [address] [address], [place] [address]    Follow-Up: At [facility], you and your health needs are our priority.  As part of our continuing mission to provide you with exceptional heart care, we have created designated Provider Care Teams.  These Care Teams include your primary Cardiologist (physician) and Advanced Practice Providers (APPs -  Physician Assistants and Nurse Practitioners) who all work together to provide you with the care you need, when you need it.  We recommend signing up for the patient portal called "MyChart".  Sign up information is provided on this After Visit Summary.  MyChart is used to connect with patients for Virtual Visits (Telemedicine).  Patients are able to view lab/test results, encounter notes, upcoming appointments, etc.  Non-urgent messages can be sent to your provider as well.   To learn more about what you can do with MyChart, go to [email or web address].    Your next appointment:   6 month(s)  Provider:   You may see Dr. Kirke Corin or one of the following Advanced Practice Providers on your designated Care  Team:   Nicolasa Ducking, NP Eula Listen, PA-C Cadence Fransico Michael, PA-C Charlsie Quest, NP

## 2022-10-11 NOTE — Progress Notes (Signed)
Cardiology Office Note   Date:  10/11/2022   ID:  Cory [name], DOB 01/10/1941, MRN [id number]  PCP:  Cory Emms, NP  Cardiologist:  Dr. Kirke Corin  Chief Complaint  Patient presents with   Follow-up    6 month fu no complaints today. Meds reviewed verbally with pt.      History of Present Illness: Cory [name] is a 82 y.o. male who presents for a follow-up visit regarding peripheral arterial disease and coronary artery disease.   He has known history of coronary artery disease status post CABG, history of CVA, essential hypertension, hyperlipidemia, type 2 diabetes and history of vasovagal syncope.  His CABG was in 2000.  He had PCI of SVG to OM in 2019 complicated by aphasia and confusion but negative stroke work-up. 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  He was hospitalized at [facility] in January of 2022 with expressive aphasia.  CT was negative.  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  Carotid Doppler showed 60% right carotid stenosis.  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 He was seen for left calf claudication.  Lower extremity arterial Doppler done in July, 2022 showed an ABI of 1.04 on the right and 0.71 on the left.  Duplex showed significant stenosis in the left popliteal artery.  Subtotal occlusion could not be ruled out.  He was hospitalized in June of 2023 with severe sepsis due to E. coli bacteremia secondary to UTI.  He had acute on chronic kidney disease but that improved with hydration.  He had episodes of orthostatic dizziness last year and the dose of amlodipine was decreased.  His amlodipine was subsequently discontinued but he went to the emergency room in February with hypertensive urgency.  It was resumed.  He has been doing reasonably well and denies chest pain or worsening dyspnea.  He  does complain of increased arthritis pain and also increased left leg claudication.  He walks with a cane.  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   Left scalp - EDC   Stroke (HCC)    on Plavix and ASA   Vasovagal syncope     Past Surgical History:  Procedure Laterality Date   CHOLECYSTECTOMY     CORONARY ARTERY BYPASS GRAFT  2000   DEBRIDEMENT AND CLOSURE WOUND N/A 01/19/2021   Procedure: Closure of scalp defect;  Surgeon: Allena Napoleon, MD;  Location: [facility];  Service: Plastics;  Laterality: N/A;   MOHS SURGERY     NECK SURGERY       Current Outpatient Medications  Medication Sig Dispense Refill   acetaminophen (TYLENOL) 500 MG tablet Take 1,000 mg by mouth at bedtime.     amLODipine (NORVASC) 5 MG tablet Take 5 mg by mouth daily.     aspirin EC 81 MG tablet Take 1 tablet (81 mg total) by mouth daily. Swallow whole. 90 tablet 3  atorvastatin (LIPITOR) 40 MG tablet TAKE 1 TABLET EVERY DAY 90 tablet 3   carvedilol (COREG) 6.25 MG tablet TAKE 1 TABLET TWICE DAILY (STOP CLONIDINE) 180 tablet 2   clopidogrel (PLAVIX) 75 MG tablet TAKE 1 TABLET EVERY DAY 90 tablet 3   cycloSPORINE (RESTASIS) 0.05 % ophthalmic emulsion 1 drop 2 (two) times a day.     diclofenac Sodium (VOLTAREN) 1 % GEL Apply 2 g topically as needed.     escitalopram (LEXAPRO) 10 MG tablet Take 10 mg by mouth daily.     famotidine (PEPCID) 20 MG tablet Take 20 mg by mouth at bedtime.     fluticasone (FLONASE) 50 MCG/ACT nasal spray USE 1 SPRAY IN BOTH NOSTRILS DAILY 48 g 3   GEMTESA 75 MG TABS Take 1 tablet by mouth daily. 90 tablet 3   latanoprost (XALATAN)  0.005 % ophthalmic solution daily.     lisinopril (ZESTRIL) 20 MG tablet Take 20 mg by mouth daily.     melatonin 5 MG TABS Take 5 mg by mouth at bedtime.     montelukast (SINGULAIR) 10 MG tablet TAKE 1 TABLET (10 MG TOTAL) BY MOUTH AT BEDTIME. 90 tablet 3   nitroGLYCERIN (NITROSTAT) 0.4 MG SL tablet Place 1 tablet (0.4 mg total) under the tongue every 5 (five) minutes as needed. 5 tablet 0   pantoprazole (PROTONIX) 40 MG tablet Take 1 tablet (40 mg total) by mouth daily. 90 tablet 3   tamsulosin (FLOMAX) 0.4 MG CAPS capsule TAKE 1 CAPSULE EVERY DAY 90 capsule 3   traMADol (ULTRAM) 50 MG tablet Take 50 mg by mouth every 6 (six) hours as needed.     No current facility-administered medications for this visit.    Allergies:   Fesoterodine, Isosorbide, Grass pollen([id number]-swt vern), Hydrocodone, Meperidine, and Penicillins    Social History:  The patient  reports that he has quit smoking. His smoking use included pipe. He has never used smokeless tobacco. He reports that he does not currently use alcohol. He reports that he does not use drugs.   Family History:  The patient's family history includes Heart Problems in his father and mother; Heart attack in his father; Heart disease in his brother and mother.    ROS:  Please see the history of present illness.   Otherwise, review of systems are positive for none.   All other systems are reviewed and negative.    PHYSICAL EXAM: VS:  BP (!) 120/58 (BP Location: Right Arm, Patient Position: Sitting, Cuff Size: Normal)   Pulse (!) 58   Ht 5\' 5"  (1.651 m)   Wt 173 lb 6 oz (78.6 kg)   SpO2 98%   BMI 28.85 kg/m  , BMI Body mass index is 28.85 kg/m. GEN: Well nourished, well developed, in no acute distress  HEENT: normal  Neck: no JVD,  or masses.  Right carotid bruit Cardiac: RRR; no rubs, or gallops,no edema .  2/6 systolic murmur in the aortic area Respiratory:  clear to auscultation bilaterally, normal work of breathing GI: soft,  nontender, nondistended, + BS MS: no deformity or atrophy  Skin: warm and dry, no rash Neuro:  Strength and sensation are intact Psych: euthymic mood, full affect   EKG:  EKG is ordered today. The ekg ordered today demonstrates sinus bradycardia with first-degree AV block.   Recent Labs: 10/23/2021: B Natriuretic Peptide 483.3; Magnesium 2.1 06/11/2022: ALT 19; BUN 17; Creatinine, Ser 1.19; Hemoglobin 10.0; Platelets 199; Potassium 4.0; Sodium 135  Lipid Panel    Component Value Date/Time   CHOL 120 01/29/2021 1530   CHOL 137 03/13/2020 1123   TRIG 206.0 (H) 01/29/2021 1530   HDL 44.20 01/29/2021 1530   HDL 47 03/13/2020 1123   CHOLHDL 3 01/29/2021 1530   VLDL 41.2 (H) 01/29/2021 1530   LDLCALC 56 05/07/2020 0512   LDLCALC 65 03/13/2020 1123   LDLDIRECT 51.0 01/29/2021 1530      Wt Readings from Last 3 Encounters:  10/11/22 173 lb 6 oz (78.6 kg)  08/27/22 171 lb 6 oz (77.7 kg)  06/19/22 169 lb (76.7 kg)          12/08/2020    9:55 AM  PAD Screen  Previous PAD dx? No  Previous surgical procedure? Yes  Dates of procedures Hx GABG/Cardiac cath  Pain with walking? Yes  Subsides with rest? Yes  Feet/toe relief with dangling? No  Painful, non-healing ulcers? No  Extremities discolored? No      ASSESSMENT AND PLAN:  1.  Peripheral arterial disease: He does complain of increased left calf claudication but he is also limited by arthritis pain and considering his age and comorbidities, I recommend continuing medical therapy unless his symptoms worsen.  2.  Moderate calcified right carotid stenosis: Recommend a follow-up carotid Doppler in December of this year.  3.  Coronary artery disease involving native coronary artery status post CABG: Currently with no anginal symptoms.  Previous PCI and SVG to OM.  Currently on dual antiplatelet therapy for both his coronary artery disease as well as recurrent strokes.    4.  Essential hypertension: His blood pressure is  well-controlled on current medications.  He is mildly bradycardic and we can consider decreasing carvedilol if this worsens.  5.  Hyperlipidemia: Most recent lipid profile showed an LDL of 56.  Continue atorvastatin.    Disposition:   FU with me in 6 months  Signed,  Lorine Bears, MD  10/11/2022 3:39 PM    [facility]

## 2022-10-17 ENCOUNTER — Ambulatory Visit (INDEPENDENT_AMBULATORY_CARE_PROVIDER_SITE_OTHER): Payer: Medicare Other | Admitting: Podiatry

## 2022-10-17 ENCOUNTER — Encounter: Payer: Self-pay | Admitting: Podiatry

## 2022-10-17 VITALS — BP 147/68

## 2022-10-17 DIAGNOSIS — M79675 Pain in left toe(s): Secondary | ICD-10-CM | POA: Diagnosis not present

## 2022-10-17 DIAGNOSIS — M79674 Pain in right toe(s): Secondary | ICD-10-CM

## 2022-10-17 DIAGNOSIS — E119 Type 2 diabetes mellitus without complications: Secondary | ICD-10-CM | POA: Diagnosis not present

## 2022-10-17 DIAGNOSIS — B351 Tinea unguium: Secondary | ICD-10-CM | POA: Diagnosis not present

## 2022-10-20 NOTE — Progress Notes (Signed)
  Subjective:  Patient ID: Cory [name], male    DOB: 11-30-1940,  MRN: [id number]  Cory [name] presents to clinic today for painful elongated mycotic toenails 1-5 bilaterally which are tender when wearing enclosed shoe gear. Pain is relieved with periodic professional debridement.  Chief Complaint  Patient presents with   Nail Problem    DFC,Referring Provider Eden Emms, NP,LOV:04/24,A1C:6.2,BS:diet controlled, no finger sticks      New problem(s): None.   PCP is Eden Emms, NP.  Allergies  Allergen Reactions   Fesoterodine Other (See Comments)    Dry mouth and constipation   Isosorbide Other (See Comments)   Grass Pollen(K-O-R-T-Swt Vern) Other (See Comments)   Hydrocodone Anxiety   Meperidine Other (See Comments) and Rash    Other reaction(s): Other (See Comments) HALLUCINATIONS    Penicillins Rash    Review of Systems: Negative except as noted in the HPI.  Objective: No changes noted in today's physical examination. Vitals:   10/17/22 1504  BP: (!) 147/68   Cory [name] is a pleasant 82 y.o. male in NAD. AAO x 3. Vascular Examination: Capillary refill time immediate b/l. Vascular status intact b/l with palpable pedal pulses. Pedal hair sparse b/l. No pain with calf compression b/l. Skin temperature gradient WNL b/l. No cyanosis or clubbing b/l. No ischemia or gangrene noted b/l.   Neurological Examination: Protective sensation intact 5/5 intact bilaterally with 10g monofilament b/l.  Dermatological Examination: Pedal skin with normal turgor, texture and tone b/l.  No open wounds. No interdigital macerations.   Toenails 1-5 b/l thick, discolored, elongated with subungual debris and pain on dorsal palpation.   No hyperkeratotic nor porokeratotic lesions present on today's visit.  Musculoskeletal Examination: Normal muscle strength 5/5 to all lower extremity muscle groups bilaterally. No ROM STJ/ankle RLE.Marland Kitchen No pain, crepitus or joint  limitation noted with ROM b/l LE.  Patient ambulates independently without assistive aids.  Radiographs: None  Last A1c:      Latest Ref Rng & Units 08/27/2022   11:49 AM 05/28/2022   11:54 AM 02/25/2022    2:24 PM  Hemoglobin A1C  Hemoglobin-A1c 4.0 - 5.6 % 6.2  5.8  6.3    Assessment/Plan: 1. Pain due to onychomycosis of toenails of both feet   2. Diabetes mellitus without complication [facility])    Patient was evaluated and treated. All patient's and/or POA's questions/concerns addressed on today's visit. Toenails 1-5 debrided in length and girth without incident. Continue soft, supportive shoe gear daily. Report any pedal injuries to medical professional. Call office if there are any questions/concerns. -Patient/POA to call should there be question/concern in the interim.   Return in about 3 months (around 01/17/2023).  Freddie Breech, DPM

## 2022-10-25 ENCOUNTER — Other Ambulatory Visit: Payer: Self-pay | Admitting: Cardiovascular Disease

## 2022-10-25 NOTE — Telephone Encounter (Signed)
Please advise if OK to refill or defer to PCP? Thanks!

## 2022-11-13 NOTE — Progress Notes (Signed)
11/14/2022 10:31 AM   Cory [name] 1940-09-12 [id number]  Referring provider: Gweneth Dimitri, MD [address] [place],  [place] [address]  Urological history 1. BPH with LU TS -aged out of PSA screening -cysto 06/2020 - mild prostamegaly with bladder trabeculation -tamsulosin 0.4 mg daily  2. Urinary frequency -contributing factors of age, BPH, stroke, HTN, diabetes, anxiety and depression -Gemtesa 75 mg daily   3. Sepsis E. Coli bacteremia secondary to UTI 09/2021 -non-contrast CT, 09/2021 - NED  Chief Complaint  Patient presents with   Follow-up   Benign Prostatic Hypertrophy     HPI: Cory [name] is a 82 y.o. male who presents today for 12 month follow up.  Previous records reviewed.   He has no urinary complaints.  He still has some mild urgency and a mild weak stream.   Patient denies any modifying or aggravating factors.  Patient denies any recent UTI's, gross hematuria, dysuria or suprapubic/flank pain.  Patient denies any fevers, chills, nausea or vomiting.   I PSS 9/3  PVR 125 mL   IPSS     Row Name 11/14/22 1000         International Prostate Symptom Score   How often have you had the sensation of not emptying your bladder? Less than 1 in 5  Simultaneous filing. User may not have seen previous data.     How often have you had to urinate less than every two hours? Less than half the time  Simultaneous filing. User may not have seen previous data.     How often have you found you stopped and started again several times when you urinated? About half the time  Simultaneous filing. User may not have seen previous data.     How often have you found it difficult to postpone urination? Less than 1 in 5 times  Simultaneous filing. User may not have seen previous data.     How often have you had a weak urinary stream? Less than 1 in 5 times  Simultaneous filing. User may not have seen previous data.     How often have you had to strain to  start urination? Less than 1 in 5 times  Simultaneous filing. User may not have seen previous data.     How many times did you typically get up at night to urinate? 2 Times  Simultaneous filing. User may not have seen previous data.     Total IPSS Score 11  Simultaneous filing. User may not have seen previous data.       Quality of Life due to urinary symptoms   If you were to spend the rest of your life with your urinary condition just the way it is now how would you feel about that? Mixed  Simultaneous filing. User may not have seen previous data.             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   Left scalp - EDC   Stroke (  HCC)    on Plavix and ASA   Vasovagal syncope     Surgical History: Past Surgical History:  Procedure Laterality Date   CHOLECYSTECTOMY     CORONARY ARTERY BYPASS GRAFT  2000   DEBRIDEMENT AND CLOSURE WOUND N/A 01/19/2021   Procedure: Closure of scalp defect;  Surgeon: Allena Napoleon, MD;  Location: [facility];  Service: Plastics;  Laterality: N/A;   MOHS SURGERY     NECK SURGERY      Home Medications:  Allergies as of 11/14/2022       Reactions   Fesoterodine Other (See Comments)   Dry mouth and constipation   Isosorbide Other (See Comments)   Grass Pollen(k-o-r-t-swt Vern) Other (See Comments)   Hydrocodone Anxiety   Meperidine Other (See Comments), Rash   Other reaction(s): Other (See Comments) HALLUCINATIONS   Penicillins Rash        Medication List        Accurate as of November 14, 2022 10:31 AM. If you have any questions, ask  your nurse or doctor.          STOP taking these medications    traMADol 50 MG tablet Commonly known as: ULTRAM       TAKE these medications    acetaminophen 500 MG tablet Commonly known as: TYLENOL Take 1,000 mg by mouth at bedtime.   amLODipine 5 MG tablet Commonly known as: NORVASC Take 5 mg by mouth daily.   aspirin EC 81 MG tablet Take 1 tablet (81 mg total) by mouth daily. Swallow whole.   atorvastatin 40 MG tablet Commonly known as: LIPITOR TAKE 1 TABLET EVERY DAY   carvedilol 6.25 MG tablet Commonly known as: COREG TAKE 1 TABLET TWICE DAILY (STOP CLONIDINE)   clopidogrel 75 MG tablet Commonly known as: PLAVIX TAKE 1 TABLET EVERY DAY   cycloSPORINE 0.05 % ophthalmic emulsion Commonly known as: RESTASIS 1 drop 2 (two) times a day.   diclofenac Sodium 1 % Gel Commonly known as: VOLTAREN Apply 2 g topically as needed.   escitalopram 10 MG tablet Commonly known as: LEXAPRO Take 10 mg by mouth daily.   famotidine 20 MG tablet Commonly known as: PEPCID Take 20 mg by mouth at bedtime.   fluticasone 50 MCG/ACT nasal spray Commonly known as: FLONASE USE 1 SPRAY IN BOTH NOSTRILS DAILY   Gemtesa 75 MG Tabs Generic drug: Vibegron Take 1 tablet (75 mg total) by mouth daily. What changed: how much to take   latanoprost 0.005 % ophthalmic solution Commonly known as: XALATAN daily.   lisinopril 20 MG tablet Commonly known as: ZESTRIL Take 1 tablet (20 mg total) by mouth daily.   melatonin 5 MG Tabs Take 5 mg by mouth at bedtime.   montelukast 10 MG tablet Commonly known as: SINGULAIR TAKE 1 TABLET (10 MG TOTAL) BY MOUTH AT BEDTIME.   nitroGLYCERIN 0.4 MG SL tablet Commonly known as: NITROSTAT Place 1 tablet (0.4 mg total) under the tongue every 5 (five) minutes as needed.   pantoprazole 40 MG tablet Commonly known as: PROTONIX Take 1 tablet (40 mg total) by mouth daily.   tamsulosin 0.4 MG Caps capsule Commonly known as: FLOMAX Take 1  capsule (0.4 mg total) by mouth daily.        Allergies:  Allergies  Allergen Reactions   Fesoterodine Other (See Comments)    Dry mouth and constipation   Isosorbide Other (See Comments)   Grass Pollen(K-O-R-T-Swt Vern) Other (See Comments)   Hydrocodone Anxiety  Meperidine Other (See Comments) and Rash    Other reaction(s): Other (See Comments) HALLUCINATIONS    Penicillins Rash    Family History: Family History  Problem Relation Age of Onset   Heart Problems Mother    Heart disease Mother    Heart Problems Father    Heart attack Father    Heart disease Brother     Social History:  reports that he has quit smoking. His smoking use included pipe. He has never used smokeless tobacco. He reports that he does not currently use alcohol. He reports that he does not use drugs.  ROS: Pertinent ROS in HPI  Physical Exam: BP 123/61   Pulse 65   Ht 5\' 5"  (1.651 m)   Wt 173 lb (78.5 kg)   BMI 28.79 kg/m   Constitutional:  Well nourished. Alert and oriented, No acute distress. HEENT: [place] AT, moist mucus membranes.  Trachea midline Cardiovascular: No clubbing, cyanosis, or edema. Respiratory: Normal respiratory effort, no increased work of breathing. Neurologic: Grossly intact, no focal deficits, moving all 4 extremities. Psychiatric: Normal mood and affect.   Laboratory Data:    Latest Ref Rng & Units 06/11/2022    5:34 PM 05/28/2022   12:20 PM 02/25/2022    2:57 PM  CMP  Glucose 70 - 99 mg/dL 94  81  161   BUN 8 - 23 mg/dL 17  16  19    Creatinine 0.61 - 1.24 mg/dL 0.96  0.45  4.09   Sodium 135 - 145 mmol/L 135  135  131   Potassium 3.5 - 5.1 mmol/L 4.0  5.2  4.7   Chloride 98 - 111 mmol/L 102  101  98   CO2 22 - 32 mmol/L 25  28  26    Calcium 8.9 - 10.3 mg/dL 8.8  9.0  9.1   Total Protein 6.5 - 8.1 g/dL 6.7   6.5   Total Bilirubin 0.3 - 1.2 mg/dL 0.4   0.4   Alkaline Phos 38 - 126 U/L 43   42   AST 15 - 41 U/L 27   18   ALT 0 - 44 U/L 19   19     Component      Latest Ref Rng 08/27/2022 Hemoglobin A1C     4.0 - 5.6 % 6.2 !    Legend: ! Abnormal I have reviewed the labs.   Pertinent Imaging:   11/14/22 10:13  Scan Result 125 ml    Assessment & Plan:    1. BPH with LUTS -PVR demonstrates adequate emptying -most bothersome symptoms are urgency and a weak stream -continue conservative management, avoiding bladder irritants and timed voiding's -We discussed switching the tamsulosin to silodosin, but he deferred -We discussed undergoing a bladder outlet procedure, but he deferred citing his age -He would just like to continue the tamsulosin 0.4 mg daily  2. Urgency -continue Gemtesa 75 mg daily   Return in about 1 year (around 11/14/2023) for IPSS and PVR.  These notes generated with voice recognition software. I apologize for typographical errors.  Cloretta Ned  [facility] [address]  [address] [place], [place] [address] [phone]

## 2022-11-14 ENCOUNTER — Encounter: Payer: Self-pay | Admitting: Urology

## 2022-11-14 ENCOUNTER — Ambulatory Visit (INDEPENDENT_AMBULATORY_CARE_PROVIDER_SITE_OTHER): Payer: Medicare Other | Admitting: Urology

## 2022-11-14 VITALS — BP 123/61 | HR 65 | Ht 65.0 in | Wt 173.0 lb

## 2022-11-14 DIAGNOSIS — R3915 Urgency of urination: Secondary | ICD-10-CM

## 2022-11-14 DIAGNOSIS — R350 Frequency of micturition: Secondary | ICD-10-CM

## 2022-11-14 DIAGNOSIS — N401 Enlarged prostate with lower urinary tract symptoms: Secondary | ICD-10-CM

## 2022-11-14 LAB — BLADDER SCAN AMB NON-IMAGING: Scan Result: 125

## 2022-11-14 MED ORDER — GEMTESA 75 MG PO TABS: 1.0000 | ORAL_TABLET | Freq: Every day | ORAL | 3 refills | Status: DC

## 2022-11-14 MED ORDER — TAMSULOSIN HCL 0.4 MG PO CAPS: 0.4000 mg | ORAL_CAPSULE | Freq: Every day | ORAL | 3 refills | Status: DC

## 2022-11-21 ENCOUNTER — Ambulatory Visit (INDEPENDENT_AMBULATORY_CARE_PROVIDER_SITE_OTHER): Payer: Medicare Other | Admitting: Dermatology

## 2022-11-21 DIAGNOSIS — Z79899 Other long term (current) drug therapy: Secondary | ICD-10-CM

## 2022-11-21 DIAGNOSIS — L820 Inflamed seborrheic keratosis: Secondary | ICD-10-CM | POA: Diagnosis not present

## 2022-11-21 DIAGNOSIS — L578 Other skin changes due to chronic exposure to nonionizing radiation: Secondary | ICD-10-CM

## 2022-11-21 DIAGNOSIS — W908XXA Exposure to other nonionizing radiation, initial encounter: Secondary | ICD-10-CM | POA: Diagnosis not present

## 2022-11-21 DIAGNOSIS — L814 Other melanin hyperpigmentation: Secondary | ICD-10-CM | POA: Diagnosis not present

## 2022-11-21 DIAGNOSIS — L570 Actinic keratosis: Secondary | ICD-10-CM | POA: Diagnosis not present

## 2022-11-21 DIAGNOSIS — L821 Other seborrheic keratosis: Secondary | ICD-10-CM

## 2022-11-21 DIAGNOSIS — Z5111 Encounter for antineoplastic chemotherapy: Secondary | ICD-10-CM

## 2022-11-21 DIAGNOSIS — Z7189 Other specified counseling: Secondary | ICD-10-CM

## 2022-11-21 MED ORDER — FLUOROURACIL 5 % EX CREA: TOPICAL_CREAM | Freq: Two times a day (BID) | CUTANEOUS | 0 refills | Status: DC

## 2022-11-21 NOTE — Patient Instructions (Addendum)
Instructions for [facility] Medications  One or more of your medications was sent to the [facility] mail order compounding pharmacy. You will receive an email from them and can purchase the medicine through that link. It will then be mailed to your home at the address you confirmed. If for any reason you do not receive an email from them, please check your spam folder. If you still do not find the email, please let us know. [facility] phone number is [phone].   5-Fluorouracil/Calcipotriene Patient Education   Actinic keratoses are the dry, red scaly spots on the skin caused by sun damage. A portion of these spots can turn into skin cancer with time, and treating them can help prevent development of skin cancer.   Treatment of these spots requires removal of the defective skin cells. There are various ways to remove actinic keratoses, including freezing with liquid nitrogen, treatment with creams, or treatment with a blue light procedure in the office.   5-fluorouracil cream is a topical cream used to treat actinic keratoses. It works by interfering with the growth of abnormal fast-growing skin cells, such as actinic keratoses. These cells peel off and are replaced by healthy ones. THIS CREAM SHOULD BE KEPT OUT OF REACH OF CHILDREN AND PETS AND SHOULD NOT BE USED BY PREGNANT WOMEN.  5-fluorouracil/calcipotriene is a combination of the 5-fluorouracil cream with a vitamin D analog cream called calcipotriene. The calcipotriene alone does not treat actinic keratoses. However, when it is combined with 5-fluorouracil, it helps the 5-fluorouracil treat the actinic keratoses much faster so that the same results can be achieved with a much shorter treatment time.  INSTRUCTIONS FOR 5-FLUOROURACIL/CALCIPOTRIENE CREAM:   5-fluorouracil/calcipotriene cream typically only needs to be used for 4-7 days. A thin layer should be applied twice a day to the treatment areas recommended by your  physician.   If your physician prescribed you separate tubes of 5-fluourouracil and calcipotriene, apply a thin layer of 5-fluorouracil followed by a thin layer of calcipotriene.   Avoid contact with your eyes or nostrils. Avoid applying the cream to your eyelids or lips unless directed to apply there by your physician. Do not use 5-fluorouracil/calcipotriene cream on infected or open wounds.   You will develop redness, irritation and some crusting at areas where you have pre-cancer damage/actinic keratoses. IF YOU DEVELOP PAIN, BLEEDING, OR SIGNIFICANT CRUSTING, STOP THE TREATMENT EARLY - you have already gotten a good response and the actinic keratoses should clear up well.  Wash your hands after applying 5-fluorouracil 5% cream on your skin.   A moisturizer or sunscreen with a minimum SPF 30 should be applied each morning.   Once you have finished the treatment, you can apply a thin layer of Vaseline twice a day to irritated areas to soothe and calm the areas more quickly. If you experience significant discomfort, contact your physician.  For some patients it is necessary to repeat the treatment for best results.  SIDE EFFECTS: When using 5-fluorouracil/calcipotriene cream, you may have mild irritation, such as redness, dryness, swelling, or a mild burning sensation. This usually resolves within 2 weeks. The more actinic keratoses you have, the more redness and inflammation you can expect during treatment. Eye irritation has been reported rarely. If this occurs, please let us know.   If you have any trouble using this cream, please send us a MyChart message or call the office. If you have any other questions about this information, please do not hesitate to ask me   before you leave the office or contact me on MyChart or by phone.    Due to recent changes in healthcare laws, you may see results of your pathology and/or laboratory studies on MyChart before the doctors have had a chance to  review them. We understand that in some cases there may be results that are confusing or concerning to you. Please understand that not all results are received at the same time and often the doctors may need to interpret multiple results in order to provide you with the best plan of care or course of treatment. Therefore, we ask that you please give us 2 business days to thoroughly review all your results before contacting the office for clarification. Should we see a critical lab result, you will be contacted sooner.   If You Need Anything After Your Visit  If you have any questions or concerns for your doctor, please call our main line at [phone] and press option 4 to reach your doctor's medical assistant. If no one answers, please leave a voicemail as directed and we will return your call as soon as possible. Messages left after 4 pm will be answered the following business day.   You may also send us a message via MyChart. We typically respond to MyChart messages within 1-2 business days.  For prescription refills, please ask your pharmacy to contact our office. Our fax number is [phone].  If you have an urgent issue when the clinic is closed that cannot wait until the next business day, you can page your doctor at the number below.    Please note that while we do our best to be available for urgent issues outside of office hours, we are not available 24/7.   If you have an urgent issue and are unable to reach us, you may choose to seek medical care at your doctor's office, retail clinic, urgent care center, or emergency room.  If you have a medical emergency, please immediately call 911 or go to the emergency department.  Pager Numbers  - Dr. Kowalski: [phone]  - Dr. Moye: [phone]  - Dr. Stewart: [phone]  In the event of inclement weather, please call our main line at [phone] for an update on the status of any delays or closures.  Dermatology Medication  Tips: Please keep the boxes that topical medications come in in order to help keep track of the instructions about where and how to use these. Pharmacies typically print the medication instructions only on the boxes and not directly on the medication tubes.   If your medication is too expensive, please contact our office at [phone] option 4 or send us a message through MyChart.   We are unable to tell what your co-pay for medications will be in advance as this is different depending on your insurance coverage. However, we may be able to find a substitute medication at lower cost or fill out paperwork to get insurance to cover a needed medication.   If a prior authorization is required to get your medication covered by your insurance company, please allow us 1-2 business days to complete this process.  Drug prices often vary depending on where the prescription is filled and some pharmacies may offer cheaper prices.  The website www.goodrx.com contains coupons for medications through different pharmacies. The prices here do not account for what the cost may be with help from insurance (it may be cheaper with your insurance), but the website can give you the price if you   did not use any insurance.  - You can print the associated coupon and take it with your prescription to the pharmacy.  - You may also stop by our office during regular business hours and pick up a GoodRx coupon card.  - If you need your prescription sent electronically to a different pharmacy, notify our office through [facility] MyChart or by phone at [phone] option 4.     Si Usted Necesita Algo Despus de Su Visita  Tambin puede enviarnos un mensaje a travs de MyChart. Por lo general respondemos a los mensajes de MyChart en el transcurso de 1 a 2 das hbiles.  Para renovar recetas, por favor pida a su farmacia que se ponga en contacto con nuestra oficina. Nuestro nmero de fax es el [phone].  Si tiene un  asunto urgente cuando la clnica est cerrada y que no puede esperar hasta el siguiente da hbil, puede llamar/localizar a su doctor(a) al nmero que aparece a continuacin.   Por favor, tenga en cuenta que aunque hacemos todo lo posible para estar disponibles para asuntos urgentes fuera del horario de oficina, no estamos disponibles las 24 horas del da, los 7 das de la semana.   Si tiene un problema urgente y no puede comunicarse con nosotros, puede optar por buscar atencin mdica  en el consultorio de su doctor(a), en una clnica privada, en un centro de atencin urgente o en una sala de emergencias.  Si tiene una emergencia mdica, por favor llame inmediatamente al 911 o vaya a la sala de emergencias.  Nmeros de bper  - Dr. Kowalski: [phone]  - Dra. Moye: [phone]  - Dra. Stewart: [phone]  En caso de inclemencias del tiempo, por favor llame a nuestra lnea principal al [phone] para una actualizacin sobre el estado de cualquier retraso o cierre.  Consejos para la medicacin en dermatologa: Por favor, guarde las cajas en las que vienen los medicamentos de uso tpico para ayudarle a seguir las instrucciones sobre dnde y cmo usarlos. Las farmacias generalmente imprimen las instrucciones del medicamento slo en las cajas y no directamente en los tubos del medicamento.   Si su medicamento es muy caro, por favor, pngase en contacto con nuestra oficina llamando al [phone] y presione la opcin 4 o envenos un mensaje a travs de MyChart.   No podemos decirle cul ser su copago por los medicamentos por adelantado ya que esto es diferente dependiendo de la cobertura de su seguro. Sin embargo, es posible que podamos encontrar un medicamento sustituto a menor costo o llenar un formulario para que el seguro cubra el medicamento que se considera necesario.   Si se requiere una autorizacin previa para que su compaa de seguros cubra su medicamento, por favor  permtanos de 1 a 2 das hbiles para completar este proceso.  Los precios de los medicamentos varan con frecuencia dependiendo del lugar de dnde se surte la receta y alguna farmacias pueden ofrecer precios ms baratos.  El sitio web www.goodrx.com tiene cupones para medicamentos de diferentes farmacias. Los precios aqu no tienen en cuenta lo que podra costar con la ayuda del seguro (puede ser ms barato con su seguro), pero el sitio web puede darle el precio si no utiliz ningn seguro.  - Puede imprimir el cupn correspondiente y llevarlo con su receta a la farmacia.  - Tambin puede pasar por nuestra oficina durante el horario de atencin regular y recoger una tarjeta de cupones de GoodRx.  - Si necesita que su receta se   enve electrnicamente a una farmacia diferente, informe a nuestra oficina a travs de MyChart de [place] o por telfono llamando al [phone] y presione la opcin 4.  

## 2022-11-21 NOTE — Progress Notes (Signed)
Follow-Up Visit   Subjective  Cory [name] is a 82 y.o. male who presents for the following: Ak follow up, previously treated 5 lesions on the face and scalp with LN2. Patient hasn't used the topical 5FU/Calcipotriene cream mix yet. The patient has spots, moles and lesions to be evaluated, some may be new or changing.   The following portions of the chart were reviewed this encounter and updated as appropriate: medications, allergies, medical history  Review of Systems:  No other skin or systemic complaints except as noted in HPI or Assessment and Plan.  Objective  Well appearing patient in no apparent distress; mood and affect are within normal limits. A focused examination was performed of the following areas: the face, ears, and scalp Relevant exam findings are noted in the Assessment and Plan.  scalp at periphery of scar x 10, scalp x 7, R ear x 4, face x 8 (29) Erythematous thin papules/macules with gritty scale.   L neck x 1, L scalp post auricular x 1 (2) Erythematous stuck-on, waxy papule or plaque   Assessment & Plan   ACTINIC DAMAGE WITH PRECANCEROUS ACTINIC KERATOSES Counseling for Topical Chemotherapy Management: Patient exhibits: - Severe, confluent actinic changes with pre-cancerous actinic keratoses that is secondary to cumulative UV radiation exposure over time - Condition that is severe; chronic, not at goal. - diffuse scaly erythematous macules and papules with underlying dyspigmentation - Discussed Prescription "Field Treatment" topical Chemotherapy for Severe, Chronic Confluent Actinic Changes with Pre-Cancerous Actinic Keratoses Field treatment involves treatment of an entire area of skin that has confluent Actinic Changes (Sun/ Ultraviolet light damage) and PreCancerous Actinic Keratoses by method of PhotoDynamic Therapy (PDT) and/or prescription Topical Chemotherapy agents such as 5-fluorouracil, 5-fluorouracil/calcipotriene, and/or imiquimod.  The  purpose is to decrease the number of clinically evident and subclinical PreCancerous lesions to prevent progression to development of skin cancer by chemically destroying early precancer changes that may or may not be visible.  It has been shown to reduce the risk of developing skin cancer in the treated area. As a result of treatment, redness, scaling, crusting, and open sores may occur during treatment course. One or more than one of these methods may be used and may have to be used several times to control, suppress and eliminate the PreCancerous changes. Discussed treatment course, expected reaction, and possible side effects. - Recommend daily broad spectrum sunscreen SPF 30+ to sun-exposed areas, reapply every 2 hours as needed.  - Staying in the shade or wearing long sleeves, sun glasses (UVA+UVB protection) and wide brim hats (4-inch brim around the entire circumference of the hat) are also recommended. - Call for new or changing lesions. In one week start 5FU/Calcipotriene mix BID x 10 days to the scalp at entire [facility] scar site.  - In two months schedule for red light PDT of the face with debridement.  SEBORRHEIC KERATOSIS - Stuck-on, waxy, tan-brown papules and/or plaques  - Benign-appearing - Discussed benign etiology and prognosis. - Observe - Call for any changes  LENTIGINES Exam: scattered tan macules Due to sun exposure Treatment Plan: Benign-appearing, observe. Recommend daily broad spectrum sunscreen SPF 30+ to sun-exposed areas, reapply every 2 hours as needed.  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 They are chronic with expected duration over  1 year. A portion of actinic keratoses will progress to squamous cell carcinoma of the skin. It is not possible to reliably predict which  spots will progress to skin cancer and so treatment is recommended to prevent development of skin cancer.  Recommend daily broad spectrum sunscreen SPF 30+ to sun-exposed areas, reapply every 2 hours as needed.  Recommend staying in the shade or wearing long sleeves, sun glasses (UVA+UVB protection) and wide brim hats (4-inch brim around the entire circumference of the hat). Call for new or changing lesions.  Destruction of lesion - scalp at periphery of scar x 10, scalp x 7, R ear x 4, face x 8 (29) Complexity: simple   Destruction method: cryotherapy   Informed consent: discussed and consent obtained   Timeout:  patient name, date of birth, surgical site, and procedure verified Lesion destroyed using liquid nitrogen: Yes   Region frozen until ice ball extended beyond lesion: Yes   Outcome: patient tolerated procedure well with no complications   Post-procedure details: wound care instructions given    Inflamed seborrheic keratosis (2) L neck x 1, L scalp post auricular x 1  Destruction of lesion - L neck x 1, L scalp post auricular x 1 (2) Complexity: simple   Destruction method: cryotherapy   Informed consent: discussed and consent obtained   Timeout:  patient name, date of birth, surgical site, and procedure verified Lesion destroyed using liquid nitrogen: Yes   Region frozen until ice ball extended beyond lesion: Yes   Outcome: patient tolerated procedure well with no complications   Post-procedure details: wound care instructions given      Return for red light PDT of the face with debridement in 2 mths, 6 mths for AK follow up.  Maylene Roes, CMA, am acting as scribe for Armida Sans, MD .  Documentation: I have reviewed the above documentation for accuracy and completeness, and I agree with the above.  Armida Sans, MD

## 2022-11-25 ENCOUNTER — Encounter: Payer: Self-pay | Admitting: Dermatology

## 2022-11-27 ENCOUNTER — Encounter (INDEPENDENT_AMBULATORY_CARE_PROVIDER_SITE_OTHER): Payer: Self-pay

## 2022-12-26 ENCOUNTER — Ambulatory Visit: Payer: Medicare Other | Admitting: Podiatry

## 2022-12-26 ENCOUNTER — Encounter: Payer: Self-pay | Admitting: Podiatry

## 2022-12-26 DIAGNOSIS — B351 Tinea unguium: Secondary | ICD-10-CM

## 2022-12-26 DIAGNOSIS — M79674 Pain in right toe(s): Secondary | ICD-10-CM

## 2022-12-26 DIAGNOSIS — M79675 Pain in left toe(s): Secondary | ICD-10-CM

## 2022-12-26 DIAGNOSIS — E119 Type 2 diabetes mellitus without complications: Secondary | ICD-10-CM | POA: Diagnosis not present

## 2022-12-27 ENCOUNTER — Ambulatory Visit: Payer: Medicare Other | Admitting: Nurse Practitioner

## 2022-12-27 ENCOUNTER — Ambulatory Visit (INDEPENDENT_AMBULATORY_CARE_PROVIDER_SITE_OTHER): Payer: Medicare Other | Admitting: Nurse Practitioner

## 2022-12-27 ENCOUNTER — Encounter: Payer: Self-pay | Admitting: Nurse Practitioner

## 2022-12-27 VITALS — BP 146/60 | HR 57 | Temp 97.9°F | Ht 65.0 in | Wt 169.6 lb

## 2022-12-27 DIAGNOSIS — R600 Localized edema: Secondary | ICD-10-CM

## 2022-12-27 DIAGNOSIS — I5032 Chronic diastolic (congestive) heart failure: Secondary | ICD-10-CM

## 2022-12-27 DIAGNOSIS — E1165 Type 2 diabetes mellitus with hyperglycemia: Secondary | ICD-10-CM | POA: Diagnosis not present

## 2022-12-27 DIAGNOSIS — R0601 Orthopnea: Secondary | ICD-10-CM | POA: Diagnosis not present

## 2022-12-27 DIAGNOSIS — R5383 Other fatigue: Secondary | ICD-10-CM

## 2022-12-27 LAB — COMPREHENSIVE METABOLIC PANEL
ALT: 18 U/L (ref 0–53)
AST: 24 U/L (ref 0–37)
Albumin: 3.8 g/dL (ref 3.5–5.2)
Alkaline Phosphatase: 55 U/L (ref 39–117)
BUN: 15 mg/dL (ref 6–23)
CO2: 25 mEq/L (ref 19–32)
Calcium: 9 mg/dL (ref 8.4–10.5)
Chloride: 98 mEq/L (ref 96–112)
Creatinine, Ser: 1.32 mg/dL (ref 0.40–1.50)
GFR: 49.82 mL/min — ABNORMAL LOW (ref >60.00)
Glucose, Bld: 131 mg/dL — ABNORMAL HIGH (ref 70–99)
Potassium: 5 mEq/L (ref 3.5–5.1)
Sodium: 132 mEq/L — ABNORMAL LOW (ref 135–145)
Total Bilirubin: 0.5 mg/dL (ref 0.2–1.2)
Total Protein: 6.3 g/dL (ref 6.0–8.3)

## 2022-12-27 LAB — CBC
HCT: 29.5 % — ABNORMAL LOW (ref 39.0–52.0)
Hemoglobin: 9.1 g/dL — ABNORMAL LOW (ref 13.0–17.0)
MCHC: 30.9 g/dL (ref 30.0–36.0)
MCV: 76.7 fl — ABNORMAL LOW (ref 78.0–100.0)
Platelets: 233 10*3/uL (ref 150.0–400.0)
RBC: 3.85 Mil/uL — ABNORMAL LOW (ref 4.22–5.81)
RDW: 16.4 % — ABNORMAL HIGH (ref 11.5–15.5)
WBC: 5.8 10*3/uL (ref 4.0–10.5)

## 2022-12-27 LAB — BRAIN NATRIURETIC PEPTIDE: Pro B Natriuretic peptide (BNP): 218 pg/mL — ABNORMAL HIGH (ref 0.0–100.0)

## 2022-12-27 LAB — POCT GLYCOSYLATED HEMOGLOBIN (HGB A1C): Hemoglobin A1C: 6.5 % — AB (ref 4.0–5.6)

## 2022-12-27 LAB — TSH: TSH: 1.72 u[IU]/mL (ref 0.35–5.50)

## 2022-12-27 LAB — VITAMIN B12: Vitamin B-12: 1004 pg/mL — ABNORMAL HIGH (ref 211–911)

## 2022-12-27 NOTE — Patient Instructions (Signed)
Nice to see you today I will be in touch with the labs once I have reviewed them Follow up with me in 4 months, sooner if you need me

## 2022-12-27 NOTE — Progress Notes (Signed)
Established Patient Office Visit  Subjective   Patient ID: Cory [name], male    DOB: 1941-02-08  Age: 82 y.o. MRN: [id number]  Chief Complaint  Patient presents with   Diabetes    Pt complains that he feels like he is not getting better. States he Is more tired than usual.       DM2: patient is currenlty maintained on diet and lifestyle only. He is not checking his sugars at home.    HTN: patient is currenlty on carvedilol, lisinopril and is followed by cardioloy  States that he is having trouble getting up in the morning. States that his dog will wake him up to go outside. States that he can sit in the chair and gets sleepy. This is newer states it has happened over the past 3-4 months. States that he will take a nap sometinmes in the chair. States that it is after he eats breakfast   States that he will go to bed 11-12 and will start waking up and stirring aroudn 7am. States that he will go back to bed after he gets up and takes care of his animals. States that he is getting pressure form his wife in regards to her memory. She is forgetful and does not like to    Review of Systems  Constitutional:  Positive for malaise/fatigue. Negative for chills and fever.  Respiratory:  Positive for shortness of breath.   Cardiovascular:  Positive for leg swelling. Negative for chest pain.  Neurological:  Negative for headaches.      Objective:     BP (!) 146/60   Pulse (!) 57   Temp 97.9 F (36.6 C) (Temporal)   Ht 5\' 5"  (1.651 m)   Wt 169 lb 9.6 oz (76.9 kg)   SpO2 94%   BMI 28.22 kg/m  BP Readings from Last 3 Encounters:  12/27/22 (!) 146/60  11/14/22 123/61  10/17/22 (!) 147/68   Wt Readings from Last 3 Encounters:  12/27/22 169 lb 9.6 oz (76.9 kg)  11/14/22 173 lb (78.5 kg)  10/11/22 173 lb 6 oz (78.6 kg)      Physical Exam Vitals and nursing note reviewed.  Constitutional:      Appearance: Normal appearance.  Cardiovascular:     Rate and Rhythm: Normal  rate and regular rhythm.     Heart sounds: Normal heart sounds.  Pulmonary:     Effort: Pulmonary effort is normal.     Breath sounds: Normal breath sounds.  Musculoskeletal:     Right lower leg: 1+ Pitting Edema present.     Left lower leg: 1+ Pitting Edema present.  Neurological:     Mental Status: He is alert.      Results for orders placed or performed in visit on 12/27/22  POCT glycosylated hemoglobin (Hb A1C)  Result Value Ref Range   Hemoglobin A1C 6.5 (A) 4.0 - 5.6 %   HbA1c POC (<> result, manual entry)     HbA1c, POC (prediabetic range)     HbA1c, POC (controlled diabetic range)        The ASCVD Risk score (Arnett DK, et al., 2019) failed to calculate for the following reasons:   The 2019 ASCVD risk score is only valid for ages 68 to 60   The patient has a prior MI or stroke diagnosis    Assessment & Plan:   Problem List Items Addressed This Visit       Cardiovascular and Mediastinum   Chronic  diastolic (congestive) heart failure (HCC)    History of the same to review last echo EF 55 to 60%.  Pending BMP today patient having orthopnea and lower extremity edema        Endocrine   Type 2 diabetes mellitus with hyperglycemia, without long-term current use of insulin (HCC) - Primary    Patient currently maintained on lifestyle modifications slowly.  A1c of 6.5% today.  Continue lifestyle modifications no antidiabetic therapy warranted at this juncture      Relevant Orders   POCT glycosylated hemoglobin (Hb A1C) (Completed)   CBC   Comprehensive metabolic panel     Other   Lower extremity edema    History of the same with history of heart failure check BNP today      Relevant Orders   Brain natriuretic peptide   Orthopnea    History of heart failure did review last echo.  Patient has lower extremity edema check BNP pending results      Relevant Orders   Brain natriuretic peptide   Fatigue    Ambiguous in nature.  Patient is sole caregiver for  himself and his wife who has memory issues.  Query overexertion with primary responsibilities.  Will check basic labs inclusive of CBC, CMP, B12, TSH.      Relevant Orders   CBC   Vitamin B12   TSH    Return in about 4 months (around 04/28/2023) for DM recheck/ fatigue .    Audria Nine, NP

## 2022-12-27 NOTE — Assessment & Plan Note (Signed)
Patient currently maintained on lifestyle modifications slowly.  A1c of 6.5% today.  Continue lifestyle modifications no antidiabetic therapy warranted at this juncture

## 2022-12-27 NOTE — Assessment & Plan Note (Signed)
Ambiguous in nature.  Patient is sole caregiver for himself and his wife who has memory issues.  Query overexertion with primary responsibilities.  Will check basic labs inclusive of CBC, CMP, B12, TSH.

## 2022-12-27 NOTE — Assessment & Plan Note (Signed)
History of the same with history of heart failure check BNP today

## 2022-12-27 NOTE — Assessment & Plan Note (Signed)
History of the same to review last echo EF 55 to 60%.  Pending BMP today patient having orthopnea and lower extremity edema

## 2022-12-27 NOTE — Assessment & Plan Note (Signed)
History of heart failure did review last echo.  Patient has lower extremity edema check BNP pending results

## 2022-12-29 NOTE — Progress Notes (Signed)
  Subjective:  Patient ID: Cory [name], male    DOB: [birth year],  MRN: [id number]  Cory [name] presents to clinic today for preventative diabetic foot care and painful thick toenails that are difficult to trim. Pain interferes with ambulation. Aggravating factors include wearing enclosed shoe gear. Pain is relieved with periodic professional debridement.  Chief Complaint  Patient presents with   Nail Problem    DFC,Referring Provider Eden Emms, NP,lov:04/24,A1C:6.2      New problem(s): None.   PCP is Eden Emms, NP.  Allergies  Allergen Reactions   Fesoterodine Other (See Comments)    Dry mouth and constipation   Isosorbide Other (See Comments)   Grass Pollen(K-O-R-T-Swt Vern) Other (See Comments)   Hydrocodone Anxiety   Meperidine Other (See Comments) and Rash    Other reaction(s): Other (See Comments) HALLUCINATIONS    Penicillins Rash    Review of Systems: Negative except as noted in the HPI.  Objective: No changes noted in today's physical examination. There were no vitals filed for this visit. Cory [name] is a pleasant 82 y.o. male WD, WN in NAD. AAO x 3.  Vascular Examination: Capillary refill time immediate b/l. Vascular status intact b/l with palpable pedal pulses. Pedal hair sparse b/l. No pain with calf compression b/l. Skin temperature gradient WNL b/l. No cyanosis or clubbing b/l. No ischemia or gangrene noted b/l.   Neurological Examination: Protective sensation intact 5/5 intact bilaterally with 10g monofilament b/l.  Dermatological Examination: Pedal skin with normal turgor, texture and tone b/l.  No open wounds. No interdigital macerations.   Toenails 1-5 b/l thick, discolored, elongated with subungual debris and pain on dorsal palpation.   No hyperkeratotic nor porokeratotic lesions present on today's visit.  Musculoskeletal Examination: Normal muscle strength 5/5 to all lower extremity muscle groups bilaterally. No ROM  STJ/ankle RLE.Marland Kitchen No pain, crepitus or joint limitation noted with ROM b/l LE.  Patient ambulates independently without assistive aids.  Radiographs: None  Assessment/Plan: 1. Pain due to onychomycosis of toenails of both feet   2. Diabetes mellitus without complication (HCC)     -Consent given for treatment as described below: -Examined patient. -Patient to continue soft, supportive shoe gear daily. -Mycotic toenails 1-5 bilaterally were debrided in length and girth with sterile nail nippers and dremel without incident. -Patient/POA to call should there be question/concern in the interim.   Return in about 9 weeks (around 02/27/2023).  Freddie Breech, DPM

## 2022-12-30 ENCOUNTER — Encounter: Payer: Self-pay | Admitting: Nurse Practitioner

## 2023-01-01 ENCOUNTER — Telehealth: Payer: Self-pay | Admitting: Nurse Practitioner

## 2023-01-01 DIAGNOSIS — R0601 Orthopnea: Secondary | ICD-10-CM

## 2023-01-01 DIAGNOSIS — R7989 Other specified abnormal findings of blood chemistry: Secondary | ICD-10-CM

## 2023-01-01 MED ORDER — FUROSEMIDE 20 MG PO TABS: 20.0000 mg | ORAL_TABLET | Freq: Every day | ORAL | 0 refills | Status: DC

## 2023-01-01 NOTE — Telephone Encounter (Signed)
-----   Message from [facility] sent at 01/01/2023  3:32 PM EDT ----- Called patient would like script sent in for fluid pill. Have set up lab appointment for next week.

## 2023-01-01 NOTE — Telephone Encounter (Signed)
Medication sent in and labs ordered

## 2023-01-04 ENCOUNTER — Encounter: Payer: Self-pay | Admitting: Dermatology

## 2023-01-08 ENCOUNTER — Other Ambulatory Visit (INDEPENDENT_AMBULATORY_CARE_PROVIDER_SITE_OTHER): Payer: Medicare Other

## 2023-01-08 DIAGNOSIS — R7989 Other specified abnormal findings of blood chemistry: Secondary | ICD-10-CM

## 2023-01-08 DIAGNOSIS — R0601 Orthopnea: Secondary | ICD-10-CM | POA: Diagnosis not present

## 2023-01-09 ENCOUNTER — Other Ambulatory Visit (INDEPENDENT_AMBULATORY_CARE_PROVIDER_SITE_OTHER): Payer: Medicare Other

## 2023-01-09 DIAGNOSIS — R7989 Other specified abnormal findings of blood chemistry: Secondary | ICD-10-CM | POA: Diagnosis not present

## 2023-01-09 DIAGNOSIS — R0601 Orthopnea: Secondary | ICD-10-CM

## 2023-01-09 LAB — CBC
HCT: 28.9 % — ABNORMAL LOW (ref 39.0–52.0)
Hemoglobin: 9.2 g/dL — ABNORMAL LOW (ref 13.0–17.0)
MCHC: 31.8 g/dL (ref 30.0–36.0)
MCV: 75.1 fl — ABNORMAL LOW (ref 78.0–100.0)
Platelets: 220 10*3/uL (ref 150.0–400.0)
RBC: 3.85 Mil/uL — ABNORMAL LOW (ref 4.22–5.81)
RDW: 16.2 % — ABNORMAL HIGH (ref 11.5–15.5)
WBC: 4.6 10*3/uL (ref 4.0–10.5)

## 2023-01-09 LAB — IBC + FERRITIN
Ferritin: 10.5 ng/mL — ABNORMAL LOW (ref 22.0–322.0)
Iron: 27 ug/dL — ABNORMAL LOW (ref 42–165)
Saturation Ratios: 6.4 % — ABNORMAL LOW (ref 20.0–50.0)
TIBC: 420 ug/dL (ref 250.0–450.0)
Transferrin: 300 mg/dL (ref 212.0–360.0)

## 2023-01-09 LAB — BASIC METABOLIC PANEL
BUN: 15 mg/dL (ref 6–23)
CO2: 23 meq/L (ref 19–32)
Calcium: 8.6 mg/dL (ref 8.4–10.5)
Chloride: 95 meq/L — ABNORMAL LOW (ref 96–112)
Creatinine, Ser: 1.25 mg/dL (ref 0.40–1.50)
GFR: 53.17 mL/min — ABNORMAL LOW (ref >60.00)
Glucose, Bld: 145 mg/dL — ABNORMAL HIGH (ref 70–99)
Potassium: 4.2 meq/L (ref 3.5–5.1)
Sodium: 128 meq/L — ABNORMAL LOW (ref 135–145)

## 2023-01-09 NOTE — Addendum Note (Signed)
Addended by: Lovena Neighbours on: 01/09/2023 10:25 AM   Modules accepted: Orders

## 2023-01-10 ENCOUNTER — Other Ambulatory Visit: Payer: Self-pay | Admitting: Nurse Practitioner

## 2023-01-10 DIAGNOSIS — E871 Hypo-osmolality and hyponatremia: Secondary | ICD-10-CM

## 2023-01-10 DIAGNOSIS — D509 Iron deficiency anemia, unspecified: Secondary | ICD-10-CM

## 2023-01-10 LAB — BRAIN NATRIURETIC PEPTIDE: Pro B Natriuretic peptide (BNP): 184 pg/mL — ABNORMAL HIGH (ref 0.0–100.0)

## 2023-01-10 MED ORDER — IRON (FERROUS SULFATE) 325 (65 FE) MG PO TABS: 325.0000 mg | ORAL_TABLET | Freq: Every day | ORAL | 1 refills | Status: DC

## 2023-01-21 ENCOUNTER — Ambulatory Visit: Payer: Medicare Other | Admitting: Dermatology

## 2023-01-21 DIAGNOSIS — L570 Actinic keratosis: Secondary | ICD-10-CM | POA: Diagnosis not present

## 2023-01-21 MED ORDER — AMINOLEVULINIC ACID HCL 10 % EX GEL
2000.0000 mg | Freq: Once | CUTANEOUS | Status: AC
  Administered 2023-01-21: 2000 mg via TOPICAL

## 2023-01-21 NOTE — Patient Instructions (Signed)
AMELUZ/PDT Treatment Common Side Effects  - Burning/stinging, which may be severe and last up to 24-72 hours after your treatment  - Redness, swelling and/or peeling which may last up to 4 weeks  - Scaling/crusting which may last up to 2 weeks  - Sun sensitivity (you MUST avoid sun exposure for 48-72 hours after treatment)  Care Instructions  - Okay to wash with soap and water and shampoo as normal  - If needed, you can do a cold compress (ex. Ice packs) for comfort  - If okay with your Primary Doctor, you may use analgesics such as Tylenol every 4-6 hours, not to exceed recommended dose  - You may apply Cerave Healing Ointment, Vaseline or Aquaphor  - If you have a lot of swelling you may take a Benadryl to help with this (this may cause drowsiness)  Sun Precautions  - Wear a wide brim hat for the next week if outside  - Wear a sunblock with zinc or titanium dioxide at least SPF 50 daily   We will recheck you in 10-12 weeks. If any problems, please call the office and ask to speak with a nurse.

## 2023-01-21 NOTE — Progress Notes (Unsigned)
Patient completed red light phototherapy with debridement today.  ACTINIC KERATOSES Exam: Erythematous thin papules/macules with gritty scale.  Treatment Plan:  Red Light Photodynamic therapy  Procedure discussed: discussed risks, benefits, side effects. and alternatives   Prep: site scrubbed/prepped with acetone   Debridement needed: Yes (performed by Physician with sand paper.  (CPT C5184948)) Location:  FACE Number of lesions:  Multiple (> 15) Type of treatment:  Red light Aminolevulinic Acid (see MAR for details): Ameluz Aminolevulinic Acid comment:  [id number] Amount of Ameluz (mg):  1 Incubation time (minutes):  60 Number of minutes under lamp:  20 Cooling:  Fan Outcome: patient tolerated procedure well with no complications   Post-procedure details: sunscreen applied and aftercare instructions given to patient    Related Medications Aminolevulinic Acid HCl 10 % GEL 2,000 mg  Armida Sans, RMA  I, Armida Sans,  personally debrided area prior to application of aminolevulinic acid  Documentation: I have reviewed the above documentation for accuracy and completeness, and I agree with the above.  Armida Sans, MD

## 2023-01-22 ENCOUNTER — Encounter: Payer: Self-pay | Admitting: Dermatology

## 2023-01-26 ENCOUNTER — Other Ambulatory Visit: Payer: Self-pay | Admitting: Cardiovascular Disease

## 2023-01-28 ENCOUNTER — Telehealth: Payer: Self-pay

## 2023-01-28 ENCOUNTER — Encounter: Payer: Self-pay | Admitting: Nurse Practitioner

## 2023-01-28 DIAGNOSIS — J302 Other seasonal allergic rhinitis: Secondary | ICD-10-CM

## 2023-01-28 DIAGNOSIS — I6930 Unspecified sequelae of cerebral infarction: Secondary | ICD-10-CM

## 2023-01-28 MED ORDER — CLOPIDOGREL BISULFATE 75 MG PO TABS: 75.0000 mg | ORAL_TABLET | Freq: Every day | ORAL | 3 refills | Status: DC

## 2023-01-28 NOTE — Addendum Note (Signed)
Addended by: Eden Emms on: 01/28/2023 01:07 PM   Modules accepted: Orders

## 2023-01-28 NOTE — Telephone Encounter (Signed)
Refill provided

## 2023-01-28 NOTE — Telephone Encounter (Signed)
LAST APPOINTMENT DATE: 12/27/2022   NEXT APPOINTMENT DATE: 04/28/2023  Clopidogrel 75 mg  LAST REFILL: 12/28/2021  QTY: #90 3RF

## 2023-01-30 MED ORDER — MONTELUKAST SODIUM 10 MG PO TABS: 10.0000 mg | ORAL_TABLET | Freq: Every day | ORAL | 3 refills | Status: DC

## 2023-02-03 ENCOUNTER — Telehealth: Payer: Self-pay | Admitting: Nurse Practitioner

## 2023-02-03 MED ORDER — FLUTICASONE PROPIONATE 50 MCG/ACT NA SUSP: 2.0000 | Freq: Every day | NASAL | 3 refills | Status: DC

## 2023-02-03 NOTE — Telephone Encounter (Signed)
LAST APPOINTMENT DATE: 12/27/22   NEXT APPOINTMENT DATE: 04/28/2023  Flonase nasal spray. 87mcg/act   LAST REFILL: 12/28/2021  QTY: #48g 3RF

## 2023-02-03 NOTE — Telephone Encounter (Signed)
Refill provided

## 2023-02-03 NOTE — Telephone Encounter (Signed)
Prescription Request  02/03/2023  LOV: 12/27/2022  What is the name of the medication or equipment? fluticasone (FLONASE) 50 MCG/ACT nasal spray   Have you contacted your pharmacy to request a refill? No   Which pharmacy would you like this sent to?    [facility] - [place], [place] - [address] [place] [address] Phone: [phone] Fax: [phone]     Patient notified that their request is being sent to the clinical staff for review and that they should receive a response within 2 business days.   Please advise at [facility] [phone]

## 2023-02-05 ENCOUNTER — Other Ambulatory Visit: Payer: Self-pay

## 2023-02-11 ENCOUNTER — Other Ambulatory Visit (INDEPENDENT_AMBULATORY_CARE_PROVIDER_SITE_OTHER): Payer: Medicare Other

## 2023-02-11 DIAGNOSIS — E871 Hypo-osmolality and hyponatremia: Secondary | ICD-10-CM

## 2023-02-11 DIAGNOSIS — D509 Iron deficiency anemia, unspecified: Secondary | ICD-10-CM | POA: Diagnosis not present

## 2023-02-12 LAB — CBC
HCT: 31 % — ABNORMAL LOW (ref 39.0–52.0)
Hemoglobin: 9.5 g/dL — ABNORMAL LOW (ref 13.0–17.0)
MCHC: 30.6 g/dL (ref 30.0–36.0)
MCV: 77.6 fL — ABNORMAL LOW (ref 78.0–100.0)
Platelets: 225 10*3/uL (ref 150.0–400.0)
RBC: 4 Mil/uL — ABNORMAL LOW (ref 4.22–5.81)
RDW: 17.8 % — ABNORMAL HIGH (ref 11.5–15.5)
WBC: 6 10*3/uL (ref 4.0–10.5)

## 2023-02-12 LAB — BASIC METABOLIC PANEL
BUN: 16 mg/dL (ref 6–23)
CO2: 22 meq/L (ref 19–32)
Calcium: 9.1 mg/dL (ref 8.4–10.5)
Chloride: 99 meq/L (ref 96–112)
Creatinine, Ser: 1.2 mg/dL (ref 0.40–1.50)
GFR: 55.8 mL/min — ABNORMAL LOW (ref >60.00)
Glucose, Bld: 149 mg/dL — ABNORMAL HIGH (ref 70–99)
Potassium: 4.3 meq/L (ref 3.5–5.1)
Sodium: 130 meq/L — ABNORMAL LOW (ref 135–145)

## 2023-02-12 LAB — IBC + FERRITIN
Ferritin: 9.2 ng/mL — ABNORMAL LOW (ref 22.0–322.0)
Iron: 24 ug/dL — ABNORMAL LOW (ref 42–165)
Saturation Ratios: 5.6 % — ABNORMAL LOW (ref 20.0–50.0)
TIBC: 429.8 ug/dL (ref 250.0–450.0)
Transferrin: 307 mg/dL (ref 212.0–360.0)

## 2023-02-17 ENCOUNTER — Telehealth: Payer: Self-pay | Admitting: Nurse Practitioner

## 2023-02-17 DIAGNOSIS — J302 Other seasonal allergic rhinitis: Secondary | ICD-10-CM

## 2023-02-17 DIAGNOSIS — D509 Iron deficiency anemia, unspecified: Secondary | ICD-10-CM

## 2023-02-17 MED ORDER — MONTELUKAST SODIUM 10 MG PO TABS: 10.0000 mg | ORAL_TABLET | Freq: Every day | ORAL | 3 refills | Status: DC

## 2023-02-17 NOTE — Telephone Encounter (Signed)
-----   Message from [facility] sent at 02/17/2023 11:49 AM EDT ----- Called patient and reviewed all information. Patient verbalized understanding.  Patient states he is not taking oral iron. He was unable to tolerate due to GI issues.   Will call if any further questions.

## 2023-02-17 NOTE — Telephone Encounter (Signed)
Called patient and reviewed all information. Patient verbalized understanding.  Patient would prefer [place] location.   Patient is requesting a Rx of montelukast to be sent through [facility]  Will call if any further questions.

## 2023-02-17 NOTE — Telephone Encounter (Signed)
Then we need to consider seeing a blood specialist for iron infusions. We have them in [place] and [place] does he have a preference

## 2023-02-20 ENCOUNTER — Inpatient Hospital Stay: Payer: Medicare Other | Attending: Oncology | Admitting: Oncology

## 2023-02-20 ENCOUNTER — Inpatient Hospital Stay: Payer: Medicare Other

## 2023-02-20 VITALS — BP 159/80 | HR 60 | Temp 97.4°F | Wt 169.0 lb

## 2023-02-20 DIAGNOSIS — I10 Essential (primary) hypertension: Secondary | ICD-10-CM | POA: Insufficient documentation

## 2023-02-20 DIAGNOSIS — D509 Iron deficiency anemia, unspecified: Secondary | ICD-10-CM | POA: Insufficient documentation

## 2023-02-20 NOTE — Progress Notes (Signed)
Patient is having some shortness of breath

## 2023-02-20 NOTE — Progress Notes (Signed)
[facility]  [phone] [phone]  ID: Cory [name] OB: 1940-08-27  MR#: [id number]  AOZ#:[id number]  Patient Care Team: Eden Emms, NP as PCP - General (Nurse Practitioner) [place], Peter M, MD as PCP - Cardiology (Cardiology) Vanna Scotland, MD as Consulting Physician (Urology) Kathyrn Sheriff, [facility] (Inactive) as Pharmacist (Pharmacist)  CHIEF COMPLAINT: Iron deficiency anemia.  INTERVAL HISTORY: Patient is an 82 year old male who was noted to have persistently decreased hemoglobin and iron stores on routine blood work.  He is referred for further evaluation and consideration of IV iron.  He currently feels well and is asymptomatic.  He does not complain of any weakness or fatigue.  He has a good appetite and denies weight loss.  He has no chest pain, shortness of breath, cough, or hemoptysis.  He denies any nausea, vomiting, constipation, or diarrhea.  He has no melena or hematochezia.  He has no urinary complaints.  Patient feels at his baseline and offers no specific complaints today.  REVIEW OF SYSTEMS:   Review of Systems  Constitutional: Negative.  Negative for fever, malaise/fatigue and weight loss.  Respiratory: Negative.  Negative for cough, hemoptysis and shortness of breath.   Cardiovascular: Negative.  Negative for chest pain and leg swelling.  Gastrointestinal: Negative.  Negative for abdominal pain, blood in stool and melena.  Genitourinary: Negative.  Negative for hematuria.  Musculoskeletal: Negative.  Negative for back pain.  Skin: Negative.  Negative for rash.  Neurological: Negative.  Negative for dizziness, focal weakness, weakness and headaches.  Psychiatric/Behavioral: Negative.  The patient is not nervous/anxious.     As per HPI. Otherwise, a complete review of systems is negative.  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   Left scalp - EDC   Stroke (HCC)    on Plavix and ASA   Vasovagal syncope     PAST SURGICAL HISTORY: Past Surgical History:  Procedure Laterality Date   CHOLECYSTECTOMY     CORONARY ARTERY BYPASS GRAFT  2000   DEBRIDEMENT AND CLOSURE WOUND N/A 01/19/2021   Procedure: Closure of scalp defect;  Surgeon: Allena Napoleon, MD;  Location: [facility];  Service: Plastics;  Laterality: N/A;   MOHS SURGERY     NECK SURGERY      FAMILY HISTORY: Family History  Problem Relation Age of Onset   Heart Problems Mother    Heart disease Mother    Heart Problems Father    Heart attack Father    Heart disease Brother     ADVANCED DIRECTIVES (Y/N):  N  HEALTH MAINTENANCE: Social History   Tobacco Use   Smoking status: Former    Types: Pipe   Smokeless tobacco: Never  Vaping Use   Vaping status: Never Used  Substance Use Topics   Alcohol use: Not Currently   Drug use: Never     Colonoscopy:  PAP:  Bone density:  Lipid panel:  Allergies  Allergen Reactions   Fesoterodine Other (See Comments)    Dry mouth and constipation   Isosorbide Other (See Comments)  Grass Pollen(K-O-R-T-Swt Vern) Other (See Comments)   Hydrocodone Anxiety   Meperidine Other (See Comments) and Rash    Other reaction(s): Other (See Comments) HALLUCINATIONS    Penicillins Rash    Current Outpatient Medications  Medication Sig Dispense Refill   acetaminophen (TYLENOL) 500 MG tablet Take 1,000 mg by mouth at bedtime.     amLODipine (NORVASC) 5 MG tablet Take 5 mg by mouth daily.     aspirin EC 81 MG tablet Take 1 tablet (81 mg total) by mouth daily. Swallow whole. 90 tablet 3   atorvastatin (LIPITOR) 40  MG tablet TAKE 1 TABLET EVERY DAY 90 tablet 3   carvedilol (COREG) 6.25 MG tablet TAKE 1 TABLET TWICE DAILY (STOP CLONIDINE) 180 tablet 0   clopidogrel (PLAVIX) 75 MG tablet Take 1 tablet (75 mg total) by mouth daily. 90 tablet 3   cycloSPORINE (RESTASIS) 0.05 % ophthalmic emulsion 1 drop 2 (two) times a day.     diclofenac Sodium (VOLTAREN) 1 % GEL Apply 2 g topically as needed.     escitalopram (LEXAPRO) 10 MG tablet Take 10 mg by mouth daily.     famotidine (PEPCID) 20 MG tablet Take 20 mg by mouth at bedtime.     fluticasone (FLONASE) 50 MCG/ACT nasal spray Place 2 sprays into both nostrils daily. 48 g 3   GEMTESA 75 MG TABS Take 1 tablet (75 mg total) by mouth daily. 90 tablet 3   latanoprost (XALATAN) 0.005 % ophthalmic solution daily.     lisinopril (ZESTRIL) 20 MG tablet Take 1 tablet (20 mg total) by mouth daily. 90 tablet 3   melatonin 5 MG TABS Take 5 mg by mouth at bedtime.     montelukast (SINGULAIR) 10 MG tablet Take 1 tablet (10 mg total) by mouth at bedtime. 90 tablet 3   nitroGLYCERIN (NITROSTAT) 0.4 MG SL tablet Place 1 tablet (0.4 mg total) under the tongue every 5 (five) minutes as needed. 5 tablet 0   pantoprazole (PROTONIX) 40 MG tablet Take 1 tablet (40 mg total) by mouth daily. 90 tablet 3   tamsulosin (FLOMAX) 0.4 MG CAPS capsule Take 1 capsule (0.4 mg total) by mouth daily. 90 capsule 3   fluorouracil (EFUDEX) 5 % cream Apply topically 2 (two) times daily. Apply to the scalp at [facility] scar site BID x 10 days. (Patient not taking: Reported on 02/20/2023) 30 g 0   furosemide (LASIX) 20 MG tablet Take 1 tablet (20 mg total) by mouth daily. (Patient not taking: Reported on 02/20/2023) 10 tablet 0   Iron, Ferrous Sulfate, 325 (65 Fe) MG TABS Take 325 mg by mouth daily. (Patient not taking: Reported on 02/20/2023) 30 tablet 1   No current facility-administered medications for this visit.    OBJECTIVE: Vitals:   02/20/23 1507  BP: (!) 159/80  Pulse: 60  Temp: (!) 97.4 F  (36.3 C)  SpO2: 98%     Body mass index is 28.12 kg/m.    ECOG FS:0 - Asymptomatic  General: Well-developed, well-nourished, no acute distress. Eyes: Pink conjunctiva, anicteric sclera. HEENT: Normocephalic, moist mucous membranes. Lungs: No audible wheezing or coughing. Heart: Regular rate and rhythm. Abdomen: Soft, nontender, no obvious distention. Musculoskeletal: No edema, cyanosis, or clubbing. Neuro: Alert, answering all questions appropriately. Cranial nerves grossly intact. Skin: No rashes or petechiae noted. Psych: Normal affect. Lymphatics: No cervical, calvicular, axillary or inguinal LAD.   LAB RESULTS:  Lab Results  Component Value Date   NA 130 (L) 02/11/2023  K 4.3 02/11/2023   CL 99 02/11/2023   CO2 22 02/11/2023   GLUCOSE 149 (H) 02/11/2023   BUN 16 02/11/2023   CREATININE 1.20 02/11/2023   CALCIUM 9.1 02/11/2023   PROT 6.3 12/27/2022   ALBUMIN 3.8 12/27/2022   AST 24 12/27/2022   ALT 18 12/27/2022   ALKPHOS 55 12/27/2022   BILITOT 0.5 12/27/2022   GFRNONAA >60 06/11/2022   GFRAA 98 03/13/2020    Lab Results  Component Value Date   WBC 6.0 02/11/2023   NEUTROABS 3.1 06/11/2022   HGB 9.5 (L) 02/11/2023   HCT 31.0 (L) 02/11/2023   MCV 77.6 (L) 02/11/2023   PLT 225.0 02/11/2023   Lab Results  Component Value Date   IRON 24 (L) 02/11/2023   TIBC 429.8 02/11/2023   IRONPCTSAT 5.6 (L) 02/11/2023   Lab Results  Component Value Date   FERRITIN 9.2 (L) 02/11/2023     STUDIES: No results found.  ASSESSMENT: Iron deficiency anemia.  PLAN:    Iron deficiency anemia: Patient's hemoglobin and iron stores noted to be significantly reduced.  He reports he could not tolerate oral iron supplementation.  He has not had a colonoscopy or EGD in several years.  Return to clinic 5 times over the next 2 to 3 weeks to receive 200 mg IV Venofer.  Patient will then return to clinic in 4 months with repeat laboratory work, further evaluation, and  continuation of treatment if needed. Hypertension: Patient's blood pressure is moderately elevated today.  Continue monitoring and treatment per primary care.  I spent a total of 45 minutes reviewing chart data, face-to-face evaluation with the patient, counseling and coordination of care as detailed above.   Patient expressed understanding and was in agreement with this plan. He also understands that He can call clinic at any time with any questions, concerns, or complaints.    Jeralyn Ruths, MD   02/20/2023 3:53 PM

## 2023-02-26 ENCOUNTER — Encounter: Payer: Medicare Other | Admitting: Pharmacist

## 2023-02-27 ENCOUNTER — Inpatient Hospital Stay: Payer: Medicare Other

## 2023-02-27 VITALS — BP 164/65 | HR 54 | Temp 97.8°F | Resp 18

## 2023-02-27 DIAGNOSIS — D509 Iron deficiency anemia, unspecified: Secondary | ICD-10-CM | POA: Diagnosis not present

## 2023-02-27 MED ORDER — SODIUM CHLORIDE 0.9% FLUSH
10.0000 mL | Freq: Once | INTRAVENOUS | Status: AC | PRN
  Administered 2023-02-27: 10 mL
  Filled 2023-02-27: qty 10

## 2023-02-27 MED ORDER — IRON SUCROSE 20 MG/ML IV SOLN
200.0000 mg | Freq: Once | INTRAVENOUS | Status: AC
  Administered 2023-02-27: 200 mg via INTRAVENOUS
  Filled 2023-02-27: qty 10

## 2023-03-03 ENCOUNTER — Ambulatory Visit (INDEPENDENT_AMBULATORY_CARE_PROVIDER_SITE_OTHER): Payer: Medicare Other | Admitting: Podiatry

## 2023-03-03 ENCOUNTER — Encounter: Payer: Self-pay | Admitting: Podiatry

## 2023-03-03 DIAGNOSIS — M79674 Pain in right toe(s): Secondary | ICD-10-CM

## 2023-03-03 DIAGNOSIS — B351 Tinea unguium: Secondary | ICD-10-CM

## 2023-03-03 DIAGNOSIS — E119 Type 2 diabetes mellitus without complications: Secondary | ICD-10-CM

## 2023-03-03 DIAGNOSIS — M79675 Pain in left toe(s): Secondary | ICD-10-CM | POA: Diagnosis not present

## 2023-03-04 ENCOUNTER — Inpatient Hospital Stay: Payer: Medicare Other | Attending: Oncology

## 2023-03-04 VITALS — BP 140/77 | HR 70 | Temp 97.0°F | Resp 18

## 2023-03-04 DIAGNOSIS — D509 Iron deficiency anemia, unspecified: Secondary | ICD-10-CM | POA: Insufficient documentation

## 2023-03-04 MED ORDER — IRON SUCROSE 20 MG/ML IV SOLN
200.0000 mg | Freq: Once | INTRAVENOUS | Status: AC
  Administered 2023-03-04: 200 mg via INTRAVENOUS

## 2023-03-04 NOTE — Patient Instructions (Signed)
Iron Sucrose Injection What is this medication? IRON SUCROSE (EYE ern SOO krose) treats low levels of iron (iron deficiency anemia) in people with kidney disease. Iron is a mineral that plays an important role in making red blood cells, which carry oxygen from your lungs to the rest of your body. This medicine may be used for other purposes; ask your health care provider or pharmacist if you have questions. COMMON BRAND NAME(S): Venofer What should I tell my care team before I take this medication? They need to know if you have any of these conditions: Anemia not caused by low iron levels Heart disease High levels of iron in the blood Kidney disease Liver disease An unusual or allergic reaction to iron, other medications, foods, dyes, or preservatives Pregnant or trying to get pregnant Breastfeeding How should I use this medication? This medication is for infusion into a vein. It is given in a hospital or clinic setting. Talk to your care team about the use of this medication in children. While this medication may be prescribed for children as young as 2 years for selected conditions, precautions do apply. Overdosage: If you think you have taken too much of this medicine contact a poison control center or emergency room at once. NOTE: This medicine is only for you. Do not share this medicine with others. What if I miss a dose? Keep appointments for follow-up doses. It is important not to miss your dose. Call your care team if you are unable to keep an appointment. What may interact with this medication? Do not take this medication with any of the following: Deferoxamine Dimercaprol Other iron products This medication may also interact with the following: Chloramphenicol Deferasirox This list may not describe all possible interactions. Give your health care provider a list of all the medicines, herbs, non-prescription drugs, or dietary supplements you use. Also tell them if you smoke,  drink alcohol, or use illegal drugs. Some items may interact with your medicine. What should I watch for while using this medication? Visit your care team regularly. Tell your care team if your symptoms do not start to get better or if they get worse. You may need blood work done while you are taking this medication. You may need to follow a special diet. Talk to your care team. Foods that contain iron include: whole grains/cereals, dried fruits, beans, or peas, leafy green vegetables, and organ meats (liver, kidney). What side effects may I notice from receiving this medication? Side effects that you should report to your care team as soon as possible: Allergic reactions--skin rash, itching, hives, swelling of the face, lips, tongue, or throat Low blood pressure--dizziness, feeling faint or lightheaded, blurry vision Shortness of breath Side effects that usually do not require medical attention (report to your care team if they continue or are bothersome): Flushing Headache Joint pain Muscle pain Nausea Pain, redness, or irritation at injection site This list may not describe all possible side effects. Call your doctor for medical advice about side effects. You may report side effects to FDA at 1-800-FDA-1088. Where should I keep my medication? This medication is given in a hospital or clinic. It will not be stored at home. NOTE: This sheet is a summary. It may not cover all possible information. If you have questions about this medicine, talk to your doctor, pharmacist, or health care provider.  2024 Elsevier/Gold Standard (2022-09-20 00:00:00)

## 2023-03-06 ENCOUNTER — Ambulatory Visit: Payer: Medicare Other

## 2023-03-06 VITALS — Ht 65.0 in | Wt 169.0 lb

## 2023-03-06 DIAGNOSIS — E1165 Type 2 diabetes mellitus with hyperglycemia: Secondary | ICD-10-CM | POA: Diagnosis not present

## 2023-03-06 DIAGNOSIS — Z0000 Encounter for general adult medical examination without abnormal findings: Secondary | ICD-10-CM | POA: Diagnosis not present

## 2023-03-06 NOTE — Patient Instructions (Addendum)
Mr. Moffatt , Thank you for taking time to come for your Medicare Wellness Visit. I appreciate your ongoing commitment to your health goals. Please review the following plan we discussed and let me know if I can assist you in the future.   Referrals/Orders/Follow-Ups/Clinician Recommendations: none  This is a list of the screening recommended for you and due dates:  Health Maintenance  Topic Date Due   COVID-19 Vaccine (3 - Pfizer risk series) 07/13/2019   Yearly kidney health urinalysis for diabetes  02/27/2023   Flu Shot  08/27/2023*   Complete foot exam   04/05/2023   Eye exam for diabetics  06/08/2023   Hemoglobin A1C  06/27/2023   Yearly kidney function blood test for diabetes  02/11/2024   Medicare Annual Wellness Visit  03/05/2024   DTaP/Tdap/Td vaccine (5 - Td or Tdap) 04/05/2031   Pneumonia Vaccine  Completed   Zoster (Shingles) Vaccine  Completed   HPV Vaccine  Aged Out  *Topic was postponed. The date shown is not the original due date.    Advanced directives: (Declined) Advance directive discussed with you today. Even though you declined this today, please call our office should you change your mind, and we can give you the proper paperwork for you to fill out.  Next Medicare Annual Wellness Visit scheduled for next year: Yes 03/08/24 @ 1:40pm telephone

## 2023-03-06 NOTE — Progress Notes (Signed)
Subjective:   Cory [name] is a 82 y.o. male who presents for Medicare Annual/Subsequent preventive examination.  Visit Complete: Virtual I connected with  Cory [name] on 03/06/23 by a audio enabled telemedicine application and verified that I am speaking with the correct person using two identifiers.  Patient Location: Home  Provider Location: Office/Clinic  I discussed the limitations of evaluation and management by telemedicine. The patient expressed understanding and agreed to proceed.  Vital Signs: Because this visit was a virtual/telehealth visit, some criteria may be missing or patient reported. Any vitals not documented were not able to be obtained and vitals that have been documented are patient reported.  Patient Medicare AWV questionnaire was completed by the patient on 03/03/23; I have confirmed that all information answered by patient is correct and no changes since this date. Cardiac Risk Factors include: advanced age (>27men, >63 women);diabetes mellitus;hypertension;male gender;sedentary lifestyle    Objective:    Today's Vitals   03/03/23 2116 03/06/23 1405  Weight:  169 lb (76.7 kg)  Height:  5\' 5"  (1.651 m)  PainSc: 6     Body mass index is 28.12 kg/m.     03/04/2023    1:00 PM 02/20/2023    2:57 PM 07/11/2022    2:29 PM 06/11/2022    3:43 PM 02/12/2022    4:02 PM 10/20/2021    8:15 PM 06/22/2021    6:19 PM  Advanced Directives  Does Patient Have a Medical Advance Directive? No No No No Yes No No  Type of Agricultural consultant;Living will    Copy of Healthcare Power of Attorney in Chart?     No - copy requested    Would patient like information on creating a medical advance directive? No - Patient declined   No - Patient declined  No - Patient declined     Current Medications (verified) Outpatient Encounter Medications as of 03/06/2023  Medication Sig   acetaminophen (TYLENOL) 500 MG tablet Take 1,000 mg by mouth  at bedtime.   amLODipine (NORVASC) 5 MG tablet Take 5 mg by mouth daily.   aspirin EC 81 MG tablet Take 1 tablet (81 mg total) by mouth daily. Swallow whole.   atorvastatin (LIPITOR) 40 MG tablet TAKE 1 TABLET EVERY DAY   carvedilol (COREG) 6.25 MG tablet TAKE 1 TABLET TWICE DAILY (STOP CLONIDINE)   clopidogrel (PLAVIX) 75 MG tablet Take 1 tablet (75 mg total) by mouth daily.   cycloSPORINE (RESTASIS) 0.05 % ophthalmic emulsion 1 drop 2 (two) times a day.   diclofenac Sodium (VOLTAREN) 1 % GEL Apply 2 g topically as needed.   escitalopram (LEXAPRO) 10 MG tablet Take 10 mg by mouth daily.   famotidine (PEPCID) 20 MG tablet Take 20 mg by mouth at bedtime.   fluticasone (FLONASE) 50 MCG/ACT nasal spray Place 2 sprays into both nostrils daily.   GEMTESA 75 MG TABS Take 1 tablet (75 mg total) by mouth daily.   lisinopril (ZESTRIL) 20 MG tablet Take 1 tablet (20 mg total) by mouth daily.   melatonin 5 MG TABS Take 5 mg by mouth at bedtime.   montelukast (SINGULAIR) 10 MG tablet Take 1 tablet (10 mg total) by mouth at bedtime.   nitroGLYCERIN (NITROSTAT) 0.4 MG SL tablet Place 1 tablet (0.4 mg total) under the tongue every 5 (five) minutes as needed.   pantoprazole (PROTONIX) 40 MG tablet Take 1 tablet (40 mg total) by mouth daily.  tamsulosin (FLOMAX) 0.4 MG CAPS capsule Take 1 capsule (0.4 mg total) by mouth daily.   fluorouracil (EFUDEX) 5 % cream Apply topically 2 (two) times daily. Apply to the scalp at [facility] scar site BID x 10 days. (Patient not taking: Reported on 02/20/2023)   furosemide (LASIX) 20 MG tablet Take 1 tablet (20 mg total) by mouth daily. (Patient not taking: Reported on 02/20/2023)   Iron, Ferrous Sulfate, 325 (65 Fe) MG TABS Take 325 mg by mouth daily. (Patient not taking: Reported on 02/20/2023)   latanoprost (XALATAN) 0.005 % ophthalmic solution daily.   No facility-administered encounter medications on file as of 03/06/2023.    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   Left scalp - EDC   Stroke (HCC)    on Plavix and ASA   Vasovagal syncope    Past Surgical History:  Procedure Laterality Date   CHOLECYSTECTOMY     CORONARY ARTERY BYPASS GRAFT  2000   DEBRIDEMENT AND CLOSURE WOUND N/A 01/19/2021   Procedure: Closure of scalp defect;  Surgeon: Allena Napoleon, MD;  Location: [facility];  Service: Plastics;  Laterality: N/A;   MOHS SURGERY     NECK SURGERY     Family History  Problem Relation Age of Onset   Heart Problems Mother    Heart disease Mother    Heart Problems Father    Heart attack Father    Heart disease Brother    Social History   Socioeconomic History   Marital status: Married    Spouse name: Cory [name]   Number of children: Not on file   Years of education: Not on file   Highest education level: Not on file  Occupational History   Occupation: retired  Tobacco Use   Smoking status: Former    Types: Pipe   Smokeless tobacco: Never  Advertising account planner   Vaping status: Never Used  Substance and Sexual Activity   Alcohol use: Not Currently   Drug use: Never   Sexual activity: Yes  Other Topics Concern   Not on file  Social History Narrative   Lives with wife his 4th Cory [name]   Lives near wife's daughter and grandson         Cory [name] has had 4 kid and he has had 2   Social Determinants of Health   Financial Resource Strain: Low Risk  (03/03/2023)   Overall Financial Resource Strain (CARDIA)    Difficulty of Paying Living Expenses: Not hard at  all  Food Insecurity: No Food Insecurity (03/03/2023)   Hunger Vital Sign    Worried About Running Out of Food in the Last Year: Never true    Ran Out of Food in the Last Year: Never true  Transportation Needs: No Transportation Needs (03/03/2023)   PRAPARE - Administrator, Civil Service (Medical): No    Lack of Transportation (Non-Medical): No  Physical Activity: Inactive (03/03/2023)   Exercise Vital Sign    Days of Exercise per Week: 0 days  Minutes of Exercise per Session: 0 min  Stress: No Stress Concern Present (03/03/2023)   [employer] of Occupational Health - Occupational Stress Questionnaire    Feeling of Stress : Only a little  Social Connections: Unknown (03/03/2023)   Social Connection and Isolation Panel [NHANES]    Frequency of Communication with Friends and Family: Never    Frequency of Social Gatherings with Friends and Family: Once a week    Attends Religious Services: Not on Marketing executive or Organizations: No    Attends Banker Meetings: Never    Marital Status: Married    Tobacco Counseling Counseling given: Not Answered   Clinical Intake:  Pre-visit preparation completed: No  Pain : 0-10 Pain Score: 6  Pain Type: Chronic pain Pain Location: Generalized Pain Descriptors / Indicators: Aching Pain Onset: More than a month ago Pain Frequency: Constant Pain Relieving Factors: Tylenol  Pain Relieving Factors: Tylenol  BMI - recorded: 28.12 Nutritional Status: BMI 25 -29 Overweight Nutritional Risks: Nausea/ vomitting/ diarrhea (nausea episodes the last week with iron infusions) Diabetes: Yes CBG done?: No Did pt. bring in CBG monitor from home?: No  How often do you need to have someone help you when you read instructions, pamphlets, or other written materials from your doctor or pharmacy?: 2 - Rarely  Interpreter Needed?: No  Comments: lives with wife Information entered by ::  B.Aristides Luckey,LPN   Activities of Daily Living    03/03/2023    9:16 PM  In your present state of health, do you have any difficulty performing the following activities:  Hearing? 0  Vision? 1  Difficulty concentrating or making decisions? 0  Walking or climbing stairs? 1  Dressing or bathing? 0  Doing errands, shopping? 0  Preparing Food and eating ? N  Using the Toilet? N  In the past six months, have you accidently leaked urine? Y  Do you have problems with loss of bowel control? N  Managing your Medications? N  Managing your Finances? N  Housekeeping or managing your Housekeeping? N    Patient Care Team: Eden Emms, NP as PCP - General (Nurse Practitioner) [place], Peter M, MD as PCP - Cardiology (Cardiology) Vanna Scotland, MD as Consulting Physician (Urology) Kathyrn Sheriff, [facility] (Inactive) as Pharmacist (Pharmacist)  Indicate any recent Medical Services you may have received from other than [facility] providers in the past year (date may be approximate).     Assessment:   This is a routine wellness examination for [place].  Hearing/Vision screen Hearing Screening - Comments:: Pt says hearing adequate Vision Screening - Comments:: Pt says vision is adequate right now but has worsening;readers Dr Inez Pilgrim   Goals Addressed               This Visit's Progress     COMPLETED: Exercise 3x per week (30 min per time)   On track     Work on new step at home, walk the dog park      Patient Stated (pt-stated)   On track     Maintain general health.       Depression Screen    03/06/2023    2:13 PM 12/27/2022   11:50 AM 08/27/2022   11:47 AM 05/28/2022   11:53 AM 02/12/2022    3:58 PM 06/13/2021   12:04 PM 04/13/2020   11:49 AM  PHQ 2/9 Scores  PHQ - 2 Score 0 1 0 1 0 0 0  PHQ- 9  Score  5 1 4        Fall Risk    03/03/2023    9:16 PM 12/27/2022   11:51 AM 08/27/2022   11:47 AM 05/28/2022   11:53 AM 02/12/2022    4:01 PM  Fall Risk   Falls in the past  year? 0 0 1 1 1   Number falls in past yr: 0 0 1 1 0  Injury with Fall? 0 0 1 1 0  Risk for fall due to :  No Fall Risks   No Fall Risks  Follow up  Falls evaluation completed Falls evaluation completed  Falls prevention discussed    MEDICARE RISK AT HOME: Medicare Risk at Home Any stairs in or around the home?: Yes If so, are there any without handrails?: No Home free of loose throw rugs in walkways, pet beds, electrical cords, etc?: No Adequate lighting in your home to reduce risk of falls?: Yes Life alert?: No Use of a cane, walker or w/c?: Yes Grab bars in the bathroom?: No Shower chair or bench in shower?: Yes Elevated toilet seat or a handicapped toilet?: Yes  TIMED UP AND GO:  Was the test performed?  No    Cognitive Function:      12/11/2021   12:39 PM  Montreal Cognitive Assessment   Visuospatial/ Executive (0/5) 4  Naming (0/3) 3  Attention: Read list of digits (0/2) 2  Attention: Read list of letters (0/1) 1  Attention: Serial 7 subtraction starting at 100 (0/3) 3  Language: Repeat phrase (0/2) 1  Language : Fluency (0/1) 0  Abstraction (0/2) 2  Delayed Recall (0/5) 3  Orientation (0/6) 6  Total 25      03/06/2023    2:16 PM 02/12/2022    4:02 PM  6CIT Screen  What Year? 0 points 0 points  What month? 0 points 0 points  What time? 0 points 0 points  Count back from 20 0 points 0 points  Months in reverse 0 points 0 points  Repeat phrase 0 points 0 points  Total Score 0 points 0 points    Immunizations Immunization History  Administered Date(s) Administered   DTaP 01/24/2020   Fluad Quad(high Dose 65+) 01/28/2020, 01/29/2021, 01/16/2022   PFIZER(Purple Top)SARS-COV-2 Vaccination 05/25/2019, 06/15/2019   Pneumococcal Conjugate-13 05/10/2013   Pneumococcal Polysaccharide-23 12/03/2005   Td 12/15/2018   Tdap 04/12/2011, 04/04/2021   Zoster Recombinant(Shingrix) 10/17/2017, 12/23/2017    TDAP status: Up to date  Flu Vaccine status: Due,  Education has been provided regarding the importance of this vaccine. Advised may receive this vaccine at local pharmacy or Health Dept. Aware to provide a copy of the vaccination record if obtained from local pharmacy or Health Dept. Verbalized acceptance and understanding.  Pneumococcal vaccine status: Up to date  Covid-19 vaccine status: Completed vaccines  Qualifies for Shingles Vaccine? Yes   Zostavax completed No   Shingrix Completed?: No.    Education has been provided regarding the importance of this vaccine. Patient has been advised to call insurance company to determine out of pocket expense if they have not yet received this vaccine. Advised may also receive vaccine at local pharmacy or Health Dept. Verbalized acceptance and understanding.  Screening Tests Health Maintenance  Topic Date Due   COVID-19 Vaccine (3 - Pfizer risk series) 07/13/2019   Diabetic kidney evaluation - Urine ACR  02/27/2023   INFLUENZA VACCINE  08/27/2023 (Originally 11/28/2022)   FOOT EXAM  04/05/2023   OPHTHALMOLOGY EXAM  06/08/2023  HEMOGLOBIN A1C  06/27/2023   Diabetic kidney evaluation - eGFR measurement  02/11/2024   Medicare Annual Wellness (AWV)  03/05/2024   DTaP/Tdap/Td (5 - Td or Tdap) 04/05/2031   Pneumonia Vaccine 80+ Years old  Completed   Zoster Vaccines- Shingrix  Completed   HPV VACCINES  Aged Out    Health Maintenance  Health Maintenance Due  Topic Date Due   COVID-19 Vaccine (3 - Pfizer risk series) 07/13/2019   Diabetic kidney evaluation - Urine ACR  02/27/2023    Colorectal cancer screening: No longer required.   Lung Cancer Screening: (Low Dose CT Chest recommended if Age 73-80 years, 20 pack-year currently smoking OR have quit w/in 15years.) does not qualify.   Lung Cancer Screening Referral: no  Additional Screening:  Hepatitis C Screening: does not qualify; Completed no  Vision Screening: Recommended annual ophthalmology exams for early detection of glaucoma and  other disorders of the eye. Is the patient up to date with their annual eye exam?  Yes  Who is the provider or what is the name of the office in which the patient attends annual eye exams? Dr Inez Pilgrim If pt is not established with a provider, would they like to be referred to a provider to establish care? No .   Dental Screening: Recommended annual dental exams for proper oral hygiene  Diabetic Foot Exam: Diabetic Foot Exam: Overdue, Pt has been advised about the importance in completing this exam. Pt is scheduled for diabetic foot exam on 04/28/23 w/PCP.  Community Resource Referral / Chronic Care Management: CRR required this visit?  No   CCM required this visit?  No     Plan:     I have personally reviewed and noted the following in the patient's chart:   Medical and social history Use of alcohol, tobacco or illicit drugs  Current medications and supplements including opioid prescriptions. Patient is not currently taking opioid prescriptions. Functional ability and status Nutritional status Physical activity Advanced directives List of other physicians Hospitalizations, surgeries, and ER visits in previous 12 months Vitals Screenings to include cognitive, depression, and falls Referrals and appointments  In addition, I have reviewed and discussed with patient certain preventive protocols, quality metrics, and best practice recommendations. A written personalized care plan for preventive services as well as general preventive health recommendations were provided to patient.     Sue Lush, LPN   [id number]/12/8117   After Visit Summary: (MyChart) Due to this being a telephonic visit, the after visit summary with patients personalized plan was offered to patient via MyChart   Nurse Notes: Pt indicates he continues to manage generalized chronic pain. He has an appt 04/28/23 with PCP in which pt wants to re-visit discussion regarding a hernia he sts he has.

## 2023-03-06 NOTE — Progress Notes (Signed)
  Subjective:  Patient ID: Cory [name], male    DOB: 09/04/1940,  MRN: [id number]  82 y.o. male presents preventative diabetic foot care and painful elongated mycotic toenails 1-5 bilaterally which are tender when wearing enclosed shoe gear. Pain is relieved with periodic professional debridement.  New problem(s): None   PCP is Eden Emms, NP , and last visit was December 27, 2022.  Allergies  Allergen Reactions   Fesoterodine Other (See Comments)    Dry mouth and constipation   Isosorbide Other (See Comments)   Grass Pollen(K-O-R-T-Swt Vern) Other (See Comments)   Hydrocodone Anxiety   Meperidine Other (See Comments) and Rash    Other reaction(s): Other (See Comments) HALLUCINATIONS    Penicillins Rash    Review of Systems: Negative except as noted in the HPI.   Objective:  Cory [name] is a pleasant 82 y.o. male in NAD. AAO x 3.  Vascular Examination: Vascular status intact b/l with palpable pedal pulses. CFT immediate b/l. Pedal hair present. No edema. No pain with calf compression b/l. Skin temperature gradient WNL b/l. No varicosities noted. No cyanosis or clubbing noted.  Neurological Examination: Sensation grossly intact b/l with 10 gram monofilament. Vibratory sensation intact b/l.  Dermatological Examination: Pedal skin with normal turgor, texture and tone b/l. No open wounds nor interdigital macerations noted. Toenails 1-5 b/l thick, discolored, elongated with subungual debris and pain on dorsal palpation. No hyperkeratotic lesions noted b/l.   Musculoskeletal Examination: Muscle strength 5/5 to b/l LE.  No pain, crepitus noted b/l. Limited joint ROM to the right foot. Patient ambulates independently without assistive aids.   Radiographs: None  Last A1c:      Latest Ref Rng & Units 12/27/2022   11:53 AM 08/27/2022   11:49 AM 05/28/2022   11:54 AM  Hemoglobin A1C  Hemoglobin-A1c 4.0 - 5.6 % 6.5  6.2  5.8      Assessment:   1. Pain due to  onychomycosis of toenails of both feet   2. Diabetes mellitus without complication (HCC)    Plan:  -Patient was evaluated today. All questions/concerns addressed on today's visit. -Continue supportive shoe gear daily. -Toenails 1-5 b/l were debrided in length and girth with sterile nail nippers and dremel without iatrogenic bleeding.  -Patient/POA to call should there be question/concern in the interim.  Return in about 9 weeks (around 05/05/2023).  Freddie Breech, DPM

## 2023-03-06 NOTE — Addendum Note (Signed)
Addended by: Lovena Neighbours on: 03/06/2023 03:30 PM   Modules accepted: Orders

## 2023-03-07 ENCOUNTER — Inpatient Hospital Stay: Payer: Medicare Other

## 2023-03-07 VITALS — BP 155/72 | HR 56

## 2023-03-07 DIAGNOSIS — D509 Iron deficiency anemia, unspecified: Secondary | ICD-10-CM

## 2023-03-07 MED ORDER — IRON SUCROSE 20 MG/ML IV SOLN
200.0000 mg | Freq: Once | INTRAVENOUS | Status: AC
Start: 2023-03-07 — End: 2023-03-07
  Administered 2023-03-07: 200 mg via INTRAVENOUS
  Filled 2023-03-07: qty 10

## 2023-03-07 NOTE — Patient Instructions (Signed)
Iron Sucrose Injection What is this medication? IRON SUCROSE (EYE ern SOO krose) treats low levels of iron (iron deficiency anemia) in people with kidney disease. Iron is a mineral that plays an important role in making red blood cells, which carry oxygen from your lungs to the rest of your body. This medicine may be used for other purposes; ask your health care provider or pharmacist if you have questions. COMMON BRAND NAME(S): Venofer What should I tell my care team before I take this medication? They need to know if you have any of these conditions: Anemia not caused by low iron levels Heart disease High levels of iron in the blood Kidney disease Liver disease An unusual or allergic reaction to iron, other medications, foods, dyes, or preservatives Pregnant or trying to get pregnant Breastfeeding How should I use this medication? This medication is for infusion into a vein. It is given in a hospital or clinic setting. Talk to your care team about the use of this medication in children. While this medication may be prescribed for children as young as 2 years for selected conditions, precautions do apply. Overdosage: If you think you have taken too much of this medicine contact a poison control center or emergency room at once. NOTE: This medicine is only for you. Do not share this medicine with others. What if I miss a dose? Keep appointments for follow-up doses. It is important not to miss your dose. Call your care team if you are unable to keep an appointment. What may interact with this medication? Do not take this medication with any of the following: Deferoxamine Dimercaprol Other iron products This medication may also interact with the following: Chloramphenicol Deferasirox This list may not describe all possible interactions. Give your health care provider a list of all the medicines, herbs, non-prescription drugs, or dietary supplements you use. Also tell them if you smoke,  drink alcohol, or use illegal drugs. Some items may interact with your medicine. What should I watch for while using this medication? Visit your care team regularly. Tell your care team if your symptoms do not start to get better or if they get worse. You may need blood work done while you are taking this medication. You may need to follow a special diet. Talk to your care team. Foods that contain iron include: whole grains/cereals, dried fruits, beans, or peas, leafy green vegetables, and organ meats (liver, kidney). What side effects may I notice from receiving this medication? Side effects that you should report to your care team as soon as possible: Allergic reactions--skin rash, itching, hives, swelling of the face, lips, tongue, or throat Low blood pressure--dizziness, feeling faint or lightheaded, blurry vision Shortness of breath Side effects that usually do not require medical attention (report to your care team if they continue or are bothersome): Flushing Headache Joint pain Muscle pain Nausea Pain, redness, or irritation at injection site This list may not describe all possible side effects. Call your doctor for medical advice about side effects. You may report side effects to FDA at 1-800-FDA-1088. Where should I keep my medication? This medication is given in a hospital or clinic. It will not be stored at home. NOTE: This sheet is a summary. It may not cover all possible information. If you have questions about this medicine, talk to your doctor, pharmacist, or health care provider.  2024 Elsevier/Gold Standard (2022-09-20 00:00:00)

## 2023-03-11 ENCOUNTER — Inpatient Hospital Stay: Payer: Medicare Other

## 2023-03-11 VITALS — BP 149/71

## 2023-03-11 DIAGNOSIS — D509 Iron deficiency anemia, unspecified: Secondary | ICD-10-CM

## 2023-03-11 MED ORDER — IRON SUCROSE 20 MG/ML IV SOLN
200.0000 mg | Freq: Once | INTRAVENOUS | Status: AC
  Administered 2023-03-11: 200 mg via INTRAVENOUS

## 2023-03-11 NOTE — Patient Instructions (Signed)
Iron Sucrose Injection What is this medication? IRON SUCROSE (EYE ern SOO krose) treats low levels of iron (iron deficiency anemia) in people with kidney disease. Iron is a mineral that plays an important role in making red blood cells, which carry oxygen from your lungs to the rest of your body. This medicine may be used for other purposes; ask your health care provider or pharmacist if you have questions. COMMON BRAND NAME(S): Venofer What should I tell my care team before I take this medication? They need to know if you have any of these conditions: Anemia not caused by low iron levels Heart disease High levels of iron in the blood Kidney disease Liver disease An unusual or allergic reaction to iron, other medications, foods, dyes, or preservatives Pregnant or trying to get pregnant Breastfeeding How should I use this medication? This medication is for infusion into a vein. It is given in a hospital or clinic setting. Talk to your care team about the use of this medication in children. While this medication may be prescribed for children as young as 2 years for selected conditions, precautions do apply. Overdosage: If you think you have taken too much of this medicine contact a poison control center or emergency room at once. NOTE: This medicine is only for you. Do not share this medicine with others. What if I miss a dose? Keep appointments for follow-up doses. It is important not to miss your dose. Call your care team if you are unable to keep an appointment. What may interact with this medication? Do not take this medication with any of the following: Deferoxamine Dimercaprol Other iron products This medication may also interact with the following: Chloramphenicol Deferasirox This list may not describe all possible interactions. Give your health care provider a list of all the medicines, herbs, non-prescription drugs, or dietary supplements you use. Also tell them if you smoke,  drink alcohol, or use illegal drugs. Some items may interact with your medicine. What should I watch for while using this medication? Visit your care team regularly. Tell your care team if your symptoms do not start to get better or if they get worse. You may need blood work done while you are taking this medication. You may need to follow a special diet. Talk to your care team. Foods that contain iron include: whole grains/cereals, dried fruits, beans, or peas, leafy green vegetables, and organ meats (liver, kidney). What side effects may I notice from receiving this medication? Side effects that you should report to your care team as soon as possible: Allergic reactions--skin rash, itching, hives, swelling of the face, lips, tongue, or throat Low blood pressure--dizziness, feeling faint or lightheaded, blurry vision Shortness of breath Side effects that usually do not require medical attention (report to your care team if they continue or are bothersome): Flushing Headache Joint pain Muscle pain Nausea Pain, redness, or irritation at injection site This list may not describe all possible side effects. Call your doctor for medical advice about side effects. You may report side effects to FDA at 1-800-FDA-1088. Where should I keep my medication? This medication is given in a hospital or clinic. It will not be stored at home. NOTE: This sheet is a summary. It may not cover all possible information. If you have questions about this medicine, talk to your doctor, pharmacist, or health care provider.  2024 Elsevier/Gold Standard (2022-09-20 00:00:00)

## 2023-03-13 ENCOUNTER — Inpatient Hospital Stay: Payer: Medicare Other

## 2023-03-13 VITALS — BP 152/67 | HR 57 | Temp 97.6°F | Resp 20

## 2023-03-13 DIAGNOSIS — D509 Iron deficiency anemia, unspecified: Secondary | ICD-10-CM | POA: Diagnosis not present

## 2023-03-13 MED ORDER — IRON SUCROSE 20 MG/ML IV SOLN
200.0000 mg | Freq: Once | INTRAVENOUS | Status: AC
  Administered 2023-03-13: 200 mg via INTRAVENOUS
  Filled 2023-03-13: qty 10

## 2023-03-13 MED ORDER — SODIUM CHLORIDE 0.9% FLUSH
10.0000 mL | Freq: Once | INTRAVENOUS | Status: DC | PRN
  Filled 2023-03-13: qty 10

## 2023-04-07 ENCOUNTER — Ambulatory Visit: Payer: Medicare Other

## 2023-04-07 ENCOUNTER — Other Ambulatory Visit: Payer: Self-pay | Admitting: Emergency Medicine

## 2023-04-07 DIAGNOSIS — Z09 Encounter for follow-up examination after completed treatment for conditions other than malignant neoplasm: Secondary | ICD-10-CM

## 2023-04-12 ENCOUNTER — Emergency Department: Payer: Medicare Other

## 2023-04-12 ENCOUNTER — Other Ambulatory Visit: Payer: Self-pay

## 2023-04-12 ENCOUNTER — Emergency Department
Admission: EM | Admit: 2023-04-12 | Discharge: 2023-04-12 | Disposition: A | Discharge status: Home / Self Care | Payer: Medicare Other | Attending: Emergency Medicine | Admitting: Emergency Medicine

## 2023-04-12 DIAGNOSIS — Z951 Presence of aortocoronary bypass graft: Secondary | ICD-10-CM | POA: Diagnosis not present

## 2023-04-12 DIAGNOSIS — I10 Essential (primary) hypertension: Secondary | ICD-10-CM | POA: Insufficient documentation

## 2023-04-12 DIAGNOSIS — R0789 Other chest pain: Secondary | ICD-10-CM | POA: Insufficient documentation

## 2023-04-12 DIAGNOSIS — E119 Type 2 diabetes mellitus without complications: Secondary | ICD-10-CM | POA: Insufficient documentation

## 2023-04-12 DIAGNOSIS — R079 Chest pain, unspecified: Secondary | ICD-10-CM | POA: Diagnosis present

## 2023-04-12 LAB — TROPONIN I (HIGH SENSITIVITY)
Troponin I (High Sensitivity): 14 ng/L (ref <18)
Troponin I (High Sensitivity): 13 ng/L (ref <18)

## 2023-04-12 LAB — BASIC METABOLIC PANEL
Anion gap: 9 (ref 5–15)
BUN: 14 mg/dL (ref 8–23)
CO2: 25 mmol/L (ref 22–32)
Calcium: 9 mg/dL (ref 8.9–10.3)
Chloride: 98 mmol/L (ref 98–111)
Creatinine, Ser: 0.99 mg/dL (ref 0.61–1.24)
GFR, Estimated: >60 mL/min (ref >60)
Glucose, Bld: 84 mg/dL (ref 70–99)
Potassium: 4.3 mmol/L (ref 3.5–5.1)
Sodium: 132 mmol/L — ABNORMAL LOW (ref 135–145)

## 2023-04-12 LAB — CBC
HCT: 38.1 % — ABNORMAL LOW (ref 39.0–52.0)
Hemoglobin: 12.1 g/dL — ABNORMAL LOW (ref 13.0–17.0)
MCH: 27.6 pg (ref 26.0–34.0)
MCHC: 31.8 g/dL (ref 30.0–36.0)
MCV: 86.8 fL (ref 80.0–100.0)
Platelets: 209 10*3/uL (ref 150–400)
RBC: 4.39 MIL/uL (ref 4.22–5.81)
RDW: 21.3 % — ABNORMAL HIGH (ref 11.5–15.5)
WBC: 5.6 10*3/uL (ref 4.0–10.5)
nRBC: 0 % (ref 0.0–0.2)

## 2023-04-12 LAB — D-DIMER, QUANTITATIVE: D-Dimer, Quant: 0.48 ug{FEU}/mL (ref 0.00–0.50)

## 2023-04-12 MED ORDER — PANTOPRAZOLE SODIUM 40 MG IV SOLR
40.0000 mg | Freq: Once | INTRAVENOUS | Status: AC
  Administered 2023-04-12: 40 mg via INTRAVENOUS
  Filled 2023-04-12: qty 10

## 2023-04-12 MED ORDER — IOHEXOL 350 MG/ML SOLN
100.0000 mL | Freq: Once | INTRAVENOUS | Status: AC | PRN
  Administered 2023-04-12: 100 mL via INTRAVENOUS

## 2023-04-12 MED ORDER — ALUM & MAG HYDROXIDE-SIMETH 200-200-20 MG/5ML PO SUSP
30.0000 mL | Freq: Once | ORAL | Status: AC
Start: 2023-04-12 — End: 2023-04-12
  Administered 2023-04-12: 30 mL via ORAL
  Filled 2023-04-12: qty 30

## 2023-04-12 MED ORDER — LIDOCAINE 5 % EX PTCH: 1.0000 | MEDICATED_PATCH | Freq: Two times a day (BID) | CUTANEOUS | 0 refills | Status: AC

## 2023-04-12 MED ORDER — CLONIDINE HCL 0.1 MG PO TABS
0.1000 mg | ORAL_TABLET | Freq: Once | ORAL | Status: AC
Start: 2023-04-12 — End: 2023-04-12
  Administered 2023-04-12: 0.1 mg via ORAL
  Filled 2023-04-12: qty 1

## 2023-04-12 MED ORDER — LIDOCAINE 5 % EX PTCH
1.0000 | MEDICATED_PATCH | CUTANEOUS | Status: DC
  Administered 2023-04-12: 1 via TRANSDERMAL
  Filled 2023-04-12: qty 1

## 2023-04-12 NOTE — ED Notes (Signed)
 Blue top sent down.

## 2023-04-12 NOTE — Discharge Instructions (Addendum)
Take Tylenol 1 g every 8 hours and use the pain patches.  Please call your cardiologist to make a follow-up appointment return to the ER if develop worsening symptoms or any other concerns

## 2023-04-12 NOTE — Plan of Care (Signed)
EKG reviewed with Dr. Gala Romney. Subtle ST up sloping elevation in inferior leads unchanged from prior.   Cory Boyden, MD MS Cardiovascular Medicine Fellow

## 2023-04-12 NOTE — ED Provider Notes (Signed)
[facility] Provider Note    Event Date/Time   First MD Initiated Contact with Patient 04/12/23 1912     (approximate)   History   Chest Pain   HPI  Cory [name] is a 82 y.o. male who comes in with chest pain.  I reviewed a cardiology note from 10/11/2022 he is status post CABG history of CVA, hypertension, hyperlipidemia, diabetes.  CABG was in 2000 and PCI was in 2019.  Does have a history of a thoracic aortic aneurysm.  Patient is on dual antiplatelet therapy.  Patient reports that he has been having this discomfort off and on for a couple of weeks but that it seem more consistent today so he came here to be evaluated.  He states that it feels a little bit like it hurts when he breathes but he denies any overt shortness of breath.  Denies any new swelling in his legs.  He denies the pain being exertional in nature.  He does report that it is worse with certain movements like sitting up.  He does have some elevated blood pressure and I reviewed some of his prior blood pressures and he typically runs a little bit lower in the 140s to 160s.  Physical Exam   Triage Vital Signs: ED Triage Vitals  Encounter Vitals Group     BP 04/12/23 1658 (!) 195/87     Systolic BP Percentile --      Diastolic BP Percentile --      Pulse Rate 04/12/23 1658 60     Resp 04/12/23 1658 14     Temp 04/12/23 1658 98.2 F (36.8 C)     Temp Source 04/12/23 1658 Oral     SpO2 04/12/23 1658 100 %     Weight --      Height --      Head Circumference --      Peak Flow --      Pain Score 04/12/23 1657 5     Pain Loc --      Pain Education --      Exclude from Growth Chart --     Most recent vital signs: Vitals:   04/12/23 1658  BP: (!) 195/87  Pulse: 60  Resp: 14  Temp: 98.2 F (36.8 C)  SpO2: 100%     General: Awake, no distress.  CV:  Good peripheral perfusion.  Resp:  Normal effort.  Abd:  No distention.  Soft and nontender Other:  Trace edema that is  baseline per patient.  Good distal pulses throughout.   ED Results / Procedures / Treatments   Labs (all labs ordered are listed, but only abnormal results are displayed) Labs Reviewed  BASIC METABOLIC PANEL - Abnormal; Notable for the following components:      Result Value   Sodium 132 (*)    All other components within normal limits  CBC - Abnormal; Notable for the following components:   Hemoglobin 12.1 (*)    HCT 38.1 (*)    RDW 21.3 (*)    All other components within normal limits  TROPONIN I (HIGH SENSITIVITY)  TROPONIN I (HIGH SENSITIVITY)     EKG  My interpretation of EKG:  Sinus bradycardia rate of 59 with some minimal ST elevation in the inferior leads, normal intervals, no T wave inversions  Reviewed his prior EKG where he has had some similar little bit of elevation noted in the inferior leads,  RADIOLOGY I have reviewed the  xray personally and no PNA    PROCEDURES:  Critical Care performed: No  Procedures   MEDICATIONS ORDERED IN ED: Medications - No data to display   IMPRESSION / MDM / ASSESSMENT AND PLAN / ED COURSE  I reviewed the triage vital signs and the nursing notes.   Patient's presentation is most consistent with acute presentation with potential threat to life or bodily function.   Consider the possibility of PE and D-dimer was negative therefore I did a CT dissection given known aneurysm.  Troponins are negative x 2.  BMP shows slightly low sodium but similar to his priors.  CBC shows stable hemoglobin.  Patient given 1 dose of clonidine to help with pain.   IMPRESSION: 1. Negative for acute PE or thoracic aortic dissection. 2. 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 Recommend annual imaging followup by CTA or MRA. This recommendation follows 2010 ACCF/AHA/AATS/ACR/ASA/SCA/SCAI/SIR/STS/SVM Guidelines for the Diagnosis and Management of Patients with Thoracic Aortic Disease. Circulation. 2010; 121: Z610-R604 3.  Moderately large hiatal hernia. 4. Colonic diverticulosis. 5. Coronary and aortic Atherosclerosis (ICD10-I70.0).  Patient's workup is overall reassuring I did discuss the case with cardiology given the slight elevation in inferior leads but he removed and thought similar to prior. D/w Dr Cory Munch from cardiology      On reevaluation patient's pain is much improved.  He seems to gets worse with movements.  Suspect this is more musculoskeletal in nature.  We discussed holding off on adjusting his blood pressure medicines given he has had normal blood pressures previously recently and this could just be related to the pain.  He will follow-up with his cardiologist next week.  He understands that if symptoms are worsening he needs to come back to the emergency room as I cannot predict future heart attacks and that he needs to follow-up with his "cardiologist next week for recheck of his blood pressure and to discuss his chest pain.   FINAL CLINICAL IMPRESSION(S) / ED DIAGNOSES   Final diagnoses:  Atypical chest pain     Rx / DC Orders   ED Discharge Orders     None        Note:  This document was prepared using Dragon voice recognition software and may include unintentional dictation errors.   Concha Se, MD 04/12/23 2300

## 2023-04-12 NOTE — ED Triage Notes (Signed)
Pt to ed from home via POV for CP x several weeks. Pt was at the dog park and felt it was more consistent than it had been in the last few weeks so he came right here. Pt is caox4, in no acute distress.

## 2023-04-14 ENCOUNTER — Telehealth: Payer: Self-pay | Admitting: Cardiovascular Disease

## 2023-04-14 NOTE — Telephone Encounter (Signed)
Spoke with patient who wanted to review his event in the ED. Reviewed the testing that he had done and he verbalized understanding. Offered him appointment for tomorrow but he declined after we spoke. He reviewed that he needed to have testing done and then follow up with Dr. Kirke Corin after that had been done which both have been set up already. Advised that if his symptoms return or worsen to go to ED for further evaluation. He verbalized understanding with no further questions at this time.

## 2023-04-14 NOTE — Telephone Encounter (Signed)
Patient would like for Dr. Kirke Corin to view the documents about his ER visit on 12/14 and to give him a call back

## 2023-04-15 ENCOUNTER — Ambulatory Visit: Payer: Medicare Other | Admitting: Cardiovascular Disease

## 2023-04-17 ENCOUNTER — Other Ambulatory Visit: Payer: Self-pay | Admitting: Cardiovascular Disease

## 2023-04-25 ENCOUNTER — Other Ambulatory Visit: Payer: Self-pay | Admitting: Cardiovascular Disease

## 2023-04-25 DIAGNOSIS — Z09 Encounter for follow-up examination after completed treatment for conditions other than malignant neoplasm: Secondary | ICD-10-CM

## 2023-04-25 DIAGNOSIS — I6523 Occlusion and stenosis of bilateral carotid arteries: Secondary | ICD-10-CM

## 2023-04-28 ENCOUNTER — Encounter: Payer: Self-pay | Admitting: Nurse Practitioner

## 2023-04-28 ENCOUNTER — Ambulatory Visit (INDEPENDENT_AMBULATORY_CARE_PROVIDER_SITE_OTHER): Payer: Medicare Other | Admitting: Nurse Practitioner

## 2023-04-28 VITALS — BP 146/70 | HR 57 | Temp 97.9°F | Ht 65.0 in | Wt 169.0 lb

## 2023-04-28 DIAGNOSIS — E1165 Type 2 diabetes mellitus with hyperglycemia: Secondary | ICD-10-CM | POA: Diagnosis not present

## 2023-04-28 DIAGNOSIS — R053 Chronic cough: Secondary | ICD-10-CM | POA: Insufficient documentation

## 2023-04-28 DIAGNOSIS — I10 Essential (primary) hypertension: Secondary | ICD-10-CM

## 2023-04-28 DIAGNOSIS — S51011A Laceration without foreign body of right elbow, initial encounter: Secondary | ICD-10-CM | POA: Insufficient documentation

## 2023-04-28 DIAGNOSIS — W19XXXA Unspecified fall, initial encounter: Secondary | ICD-10-CM | POA: Diagnosis not present

## 2023-04-28 DIAGNOSIS — K219 Gastro-esophageal reflux disease without esophagitis: Secondary | ICD-10-CM

## 2023-04-28 LAB — POCT GLYCOSYLATED HEMOGLOBIN (HGB A1C): Hemoglobin A1C: 5.9 % — AB (ref 4.0–5.6)

## 2023-04-28 MED ORDER — PANTOPRAZOLE SODIUM 40 MG PO TBEC: 40.0000 mg | DELAYED_RELEASE_TABLET | Freq: Two times a day (BID) | ORAL | 0 refills | Status: DC

## 2023-04-28 NOTE — Patient Instructions (Addendum)
Nice to see you today Follow up with me in 1 month Take protonix 40mg  twice a day, Change the dressing daily

## 2023-04-28 NOTE — Progress Notes (Signed)
Established Patient Office Visit  Subjective   Patient ID: Cory [name], male    DOB: [birth year]  Age: 82 y.o. MRN: [id number]  Chief Complaint  Patient presents with   Diabetes    Pt complains of having occasional diarrhea in the morning.        DM2: patient is currently on lifet style modifications only. States that he has been eating fairly well.    Diarrhea: Patient states daily having intermittent urgency with liquid diarrhea.  States that he generally eats the same thing in the morning which is oatmeal, banana, yogurt.  Patient states sometimes it happens before during or after breakfast.  HTN: lisinopirl 20, carvedilol 6.25 and amlodipine 5mg  daily. States that he can check blood pressure but des not.  States he did have some dizziness that led to a fall  States that last night he fell. States that he fell to the lefthand side. States that he landed on a table and cut his arm. States that he put a bandage. States that he did not hit his head or LOC. States concrete with tile. States that he got dizzy and went to one sid e   Cough: states that he does have a tendency to be intermittent. States been going on for over 6 months.  Patient states that he still having some breakthrough heartburn even being on Protonix 40 mg in famotidine 20 mg  Atypical chest pain: was seen on and dx with atypical chest pain. It is I the left pec. States described as a tooth ache. States that eating, drinking, or laying down does not effect it. States that he is scheduled to have an ultrasound of his carotids through cardiology.  He does have an appoint with his cardiologist on 05/01/2023   Review of Systems  Constitutional:  Negative for chills and fever.  Respiratory:  Negative for shortness of breath.   Cardiovascular:  Negative for chest pain.  Neurological:  Positive for dizziness, tingling (base line lower RLE) and headaches. Negative for weakness.  Psychiatric/Behavioral:  Negative for  hallucinations and suicidal ideas.       Objective:     BP (!) 146/70   Pulse (!) 57   Temp 97.9 F (36.6 C) (Oral)   Ht 5\' 5"  (1.651 m)   Wt 169 lb (76.7 kg)   SpO2 97%   BMI 28.12 kg/m  BP Readings from Last 3 Encounters:  04/28/23 (!) 146/70  04/12/23 (!) 152/83  03/13/23 (!) 152/67   Wt Readings from Last 3 Encounters:  04/28/23 169 lb (76.7 kg)  03/06/23 169 lb (76.7 kg)  02/20/23 169 lb (76.7 kg)      Physical Exam Vitals and nursing note reviewed.  Constitutional:      Appearance: Normal appearance.  Cardiovascular:     Rate and Rhythm: Normal rate and regular rhythm.     Heart sounds: Normal heart sounds.  Pulmonary:     Effort: Pulmonary effort is normal.     Breath sounds: Normal breath sounds.  Skin:      Neurological:     General: No focal deficit present.     Mental Status: He is alert.     Cranial Nerves: Cranial nerves 2-12 are intact.     Sensory: Sensation is intact.     Motor: Motor function is intact.     Deep Tendon Reflexes:     Reflex Scores:      Bicep reflexes are 2+ on the right side  and 1+ on the left side.      Patellar reflexes are 2+ on the right side and 2+ on the left side.    Comments: Bilateral upper and lower extremity strength 5/5.      Results for orders placed or performed in visit on 04/28/23  POCT glycosylated hemoglobin (Hb A1C)  Result Value Ref Range   Hemoglobin A1C 5.9 (A) 4.0 - 5.6 %   HbA1c POC (<> result, manual entry)     HbA1c, POC (prediabetic range)     HbA1c, POC (controlled diabetic range)        The ASCVD Risk score (Arnett DK, et al., 2019) failed to calculate for the following reasons:   The 2019 ASCVD risk score is only valid for ages 78 to 30   Risk score cannot be calculated because patient has a medical history suggesting prior/existing ASCVD    Assessment & Plan:   Problem List Items Addressed This Visit       Cardiovascular and Mediastinum   Hypertension   Patient currently  maintained on lisinopril 20 mg, carvedilol 6.25 mg twice daily and amlodipine 5 mg daily.  Patient blood pressure slightly above goal.  Given patient's age and fragility will not change medications at this juncture as he has frequent falls        Digestive   Gastroesophageal reflux disease   History of the same patient was currently maintained on Protonix 40 mg daily and famotidine 20 mg daily.  Not controlled we will increase Protonix to 40 mg twice daily and leave famotidine 20 mg daily.      Relevant Medications   pantoprazole (PROTONIX) 40 MG tablet     Endocrine   Type 2 diabetes mellitus with hyperglycemia, without long-term current use of insulin (HCC) - Primary   Patient currently maintained on lifestyle modifications solely.  Patient's A1c well-controlled at 5.9% today.  Continue lifestyle modifications only      Relevant Orders   POCT glycosylated hemoglobin (Hb A1C) (Completed)     Musculoskeletal and Integument   Skin tear of elbow without complication, right, initial encounter   Clean the wound with sterile water and gauze.  Did place a nonadherent pad with 4 x 4 and Coban.  Patient to change dressing daily.        Other   Fall   Fall due to dizziness per patient report.  Denies LOC or hitting his head.  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  Encouraged to continue using walking aid.      Chronic cough   Curious if this is secondary to uncontrolled GERD will increase Protonix to 40 mg twice daily.  Continue famotidine 20 mg daily      Relevant Medications   pantoprazole (PROTONIX) 40 MG tablet    Return in about 4 weeks (around 05/26/2023) for COugh, [place], Falls .    Audria Nine, NP

## 2023-04-28 NOTE — Assessment & Plan Note (Signed)
Patient currently maintained on lisinopril 20 mg, carvedilol 6.25 mg twice daily and amlodipine 5 mg daily.  Patient blood pressure slightly above goal.  Given patient's age and fragility will not change medications at this juncture as he has frequent falls

## 2023-04-28 NOTE — Assessment & Plan Note (Signed)
Curious if this is secondary to uncontrolled GERD will increase Protonix to 40 mg twice daily.  Continue famotidine 20 mg daily

## 2023-04-28 NOTE — Assessment & Plan Note (Signed)
Patient currently maintained on lifestyle modifications solely.  Patient's A1c well-controlled at 5.9% today.  Continue lifestyle modifications only

## 2023-04-28 NOTE — Assessment & Plan Note (Signed)
Fall due to dizziness per patient report.  Denies LOC or hitting his head.  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  Encouraged to continue using walking aid.

## 2023-04-28 NOTE — Assessment & Plan Note (Signed)
History of the same patient was currently maintained on Protonix 40 mg daily and famotidine 20 mg daily.  Not controlled we will increase Protonix to 40 mg twice daily and leave famotidine 20 mg daily.

## 2023-04-28 NOTE — Assessment & Plan Note (Signed)
Clean the wound with sterile water and gauze.  Did place a nonadherent pad with 4 x 4 and Coban.  Patient to change dressing daily.

## 2023-04-29 ENCOUNTER — Ambulatory Visit: Payer: Medicare Other | Attending: Cardiology

## 2023-04-29 DIAGNOSIS — I6523 Occlusion and stenosis of bilateral carotid arteries: Secondary | ICD-10-CM | POA: Insufficient documentation

## 2023-04-29 DIAGNOSIS — Z09 Encounter for follow-up examination after completed treatment for conditions other than malignant neoplasm: Secondary | ICD-10-CM | POA: Insufficient documentation

## 2023-04-29 NOTE — Progress Notes (Signed)
 Cardiology Clinic Note   Date: 05/01/2023 ID: ARLAND USERY, DOB 11-14-1940, MRN [id number]  Primary Cardiologist:  Deatrice Cage, MD  Patient Profile    Cory [name] is a 82 y.o. male who presents to the clinic today for routine follow up.     Past medical history significant for: CAD. CABG x 7 2000 performed in [place], [place]. LHC 04/07/2018 (angina) performed at [facility] hospital: Occlusion very proximal RCA and LCx.  Severe narrowing of LAD.  Patent LIMA to LAD, sequential SVG to diagonal and the distal LAD, SVG to ramus, SVG to OM and SVG to PDA  New severe narrowing of distal portion of graft to upper OM.  PCI with DES 2.75 x 12 mm to SVG to upper OM.  Anatomy otherwise unchanged from 2016. Thoracic aortic aneurysm. CTA chest abdomen pelvis 04/12/2023: Ascending thoracic aortic aneurysm 4.3 cm (previously 4 cm).  Recommend annual surveillance. PAD. ABI/lower extremity arterial ultrasound 11/17/2020: Right near normal examination.  Left 75 to 99% stenosis noted in the popliteal artery.  Scant flow in proximal SFA.  Subtotal occlusion cannot be ruled out.  Right ABI without evidence of significant arterial disease.  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 Carotid artery stenosis. Carotid duplex 04/29/2023: Right ICA 40 to 59%, right ECA > 50%.  Left ICA 1 to 39%, left ECA <50%. Chronic diastolic heart failure. Echo 10/21/2021: EF 55 to 60%.  No RWMA.  Mild concentric LVH.  Grade 1 DD.  Normal RV size/function.  Mildly elevated PA pressure, RVSP 41.4 mmHg.  Moderate LAE.  Mild MR.  Aortic valve sclerosis/calcification without stenosis.  Mild dilatation of ascending aorta 39 mm. Hypertension. Hyperlipidemia. CVA. Right hemiparesis. T2DM GERD.  In summary, patient with a history of CAD s/p CABG x 7 in 2000 performed in [place], [place].  Heart catheterization in December 2019 showed new severe narrowing of distal portion of graft to upper OM and  patient underwent PCI with DES to SVG to OM (further details above).  History of CVA in 2015 and 2 subsequent TIAs since that time.  Patient was initially followed by Dr. Jordan in [place].  He transferred his care to Dr. Cage in August 2022 to be seen closer to home.  Patient underwent LPa ultrasound July 2022 which showed moderate left lower extremity arterial disease which Dr. Cage to treated with aggressive medical management.  Carotid ultrasound November 2022 showed right ICA 40 to 59% and left ICA 1 to 39%.  Follow-up in November 2022 patient reported improvement in claudication symptoms and medical therapy was continued.     History of Present Illness    Cory [name] is followed by Dr. Jordan for the above outlined history.  Patient was last seen in the office by Dr. Cage on 10/11/2022 for routine follow-up.  He reported increased claudication at that time.  He also reported history of arthritic pain and medical therapy was continued.  He had no other complaints and no other medication changes were made.  Patient was evaluated in the ED on 04/12/2023 for chest pain. His workup was unremarkable. Troponin negative x 2. CTA negative for PE. EKG was reviewed by cardiology and unchanged from prior.   Today, patient is here alone. He is somewhat of a difficult historian. He reports increased epigastric discomfort recently that has improved since PCP increased Protonix . He also reports continued left sided chest discomfort that brought him to the ED in December. He is unable to  explain when pain comes on or if anything makes it worse or better. He denies shortness of breath or other associated symptoms. He reports chronic R>L lower extremity edema that is stable. He does have occasional dizziness when he bends down to put on his socks or take off his shoes. He describes sitting on his bed and leaning forward with his head down to do these activities. Suggested he instead remain standing and  prop his foot up so he is not bending over. He reports a recent fall earlier this week causing a significant skin tear on his right triceps area. He states he was standing in his home when he fell against a table striking his arm on a clock. He did not hit his head or lose consciousness. He thinks his right foot got caught up, as he has hemiparesis on that side from prior stroke. He denies chest pain, dizziness, or palpitations prior to falling. He was evaluated by his PCP for this and is changing the dressing once a day. His dressing today was removed and changed out, as patient had it wrapped so tightly it was causing swelling below the dressing. He was educated on not wrapping it so tightly. He verbalized understanding.        ROS: All other systems reviewed and are otherwise negative except as noted in History of Present Illness.  EKGs/Labs Reviewed    12/27/2022: ALT 18; AST 24 04/12/2023: BUN 14; Creatinine, Ser 0.99; Potassium 4.3; Sodium 132   04/12/2023: Hemoglobin 12.1; WBC 5.6   12/27/2022: TSH 1.72   01/09/2023: Pro B Natriuretic peptide (BNP) 184.0       Physical Exam    VS:  BP (!) 164/70 (BP Location: Left Arm, Patient Position: Sitting, Cuff Size: Normal)   Pulse (!) 54   Ht 5' 5 (1.651 m)   Wt 171 lb (77.6 kg)   SpO2 98%   BMI 28.46 kg/m  , BMI Body mass index is 28.46 kg/m.  GEN: Well nourished, well developed, in no acute distress. Neck: No JVD or carotid bruits. Cardiac:  RRR. No murmurs. No rubs or gallops.   Respiratory:  Respirations regular and unlabored. Clear to auscultation without rales, wheezing or rhonchi. GI: Soft, nontender, nondistended. Extremities: Radials/DP/PT 2+ and equal bilaterally. No clubbing or cyanosis. Trace edema bilateral lower extremities.   Skin: Warm and dry, no rash.  3 inch x 3 inch skin tear on triceps area without active bleeding. Ecchymosis extending to elbow with mild edema.  Neuro: Strength intact.  Assessment & Plan    CAD/atypical precordial chest pain S/p CABG x 7 2000 performed in [place], [place] .  PCI with DES to SVG to upper OM December 2019.  Patient reports left sided chest pain. He is unable to describe the pain or when it occurs. It is not reproducible with palpation. He was evaluated in the ED and workup was unremarkable. It was felt to be musculoskeletal in nature. With continued pain and no unusual activities, will get a nuclear stress test for further evaluation. Patient is in agreement.  -Schedule nuclear stress test.  -Continue aspirin , Plavix , amlodipine , atorvastatin , carvedilol , as needed SL NTG.  Carotid artery stenosis Carotid duplex 04/29/2023 showed right ICA 40 to 59%, left ICA 1 to 39% unchanged from prior imaging.  Patient reports mild dizziness with bending over to put on socks or remove shoes. It is suggested he try propping his foot up on a chair to do these activities so as not to drop his  head down. He agrees to try.  -Continue aspirin , Plavix , atorvastatin .  Thoracic aortic aneurysm CT abdomen pelvis 04/12/2023 showed ascending thoracic aortic aneurysm 4.3 cm.  Patient denies chest pain, abdominal pain, back pain, presyncope, syncope. -Continue yearly surveillance.  Hypertension BP today 164/70 on intake and 142/60 on my recheck. Given history of falls will allow for permissive hypertension. No headaches reported.  -Continue amlodipine , carvedilol , lisinopril .  Chronic diastolic heart failure Echo June 2023 showed EF 55 to 60%, mild LVH, Grade I DD, mildly elevated PA pressure, moderate LAE, mild MR.  Patient reports chronic lower extremity edema unchanged from previous. He does not wear compression socks, as they are difficult to put on. He has trace R>L lower extremity edema today. Euvolemic and well compensated on exam. -Continue carvedilol , lisinopril .  Fall/Skin tear Patient with recent fall earlier this week/last last week at home. He was standing near a table  when his right foot (lower extremity hemiparesis from previous stroke) got caught up and he fell striking his arm on a clock. He denies hitting his head or losing consciousness. He denies dizziness, chest pain or palpitations prior to falling. He sustained a skin tear on his right arm. Upon patient's arrival it was noted that dressing (gauze and coban) was very tight on his upper arm. Dressing was removed revealing a 3 inch x 3 inch skin tear on triceps area without active bleeding. Ecchymosis extending to elbow with mild edema. Area was redressed and patient was educated on not wrapping wound too tightly. He verbalized understanding.   Disposition: Lexiscan. Return in 3 months or sooner as needed.      Informed Consent   Shared Decision Making/Informed Consent The risks [chest pain, shortness of breath, cardiac arrhythmias, dizziness, blood pressure fluctuations, myocardial infarction, stroke/transient ischemic attack, nausea, vomiting, allergic reaction, radiation exposure, metallic taste sensation and life-threatening complications (estimated to be 1 in 10,000)], benefits (risk stratification, diagnosing coronary artery disease, treatment guidance) and alternatives of a nuclear stress test were discussed in detail with Mr. Delange and he agrees to proceed.      Signed, Barnie HERO. Blain Hunsucker, DNP, NP-C

## 2023-05-01 ENCOUNTER — Ambulatory Visit: Payer: Medicare Other | Attending: Student | Admitting: Student

## 2023-05-01 ENCOUNTER — Encounter: Payer: Self-pay | Admitting: Student

## 2023-05-01 ENCOUNTER — Other Ambulatory Visit: Payer: Self-pay | Admitting: *Deleted

## 2023-05-01 VITALS — BP 164/70 | HR 54 | Ht 65.0 in | Wt 171.0 lb

## 2023-05-01 DIAGNOSIS — S41112A Laceration without foreign body of left upper arm, initial encounter: Secondary | ICD-10-CM | POA: Insufficient documentation

## 2023-05-01 DIAGNOSIS — I10 Essential (primary) hypertension: Secondary | ICD-10-CM | POA: Insufficient documentation

## 2023-05-01 DIAGNOSIS — I779 Disorder of arteries and arterioles, unspecified: Secondary | ICD-10-CM

## 2023-05-01 DIAGNOSIS — I2510 Atherosclerotic heart disease of native coronary artery without angina pectoris: Secondary | ICD-10-CM | POA: Insufficient documentation

## 2023-05-01 DIAGNOSIS — R296 Repeated falls: Secondary | ICD-10-CM | POA: Diagnosis present

## 2023-05-01 DIAGNOSIS — I5032 Chronic diastolic (congestive) heart failure: Secondary | ICD-10-CM | POA: Insufficient documentation

## 2023-05-01 DIAGNOSIS — I7121 Aneurysm of the ascending aorta, without rupture: Secondary | ICD-10-CM | POA: Diagnosis present

## 2023-05-01 DIAGNOSIS — I739 Peripheral vascular disease, unspecified: Secondary | ICD-10-CM | POA: Insufficient documentation

## 2023-05-01 DIAGNOSIS — R0789 Other chest pain: Secondary | ICD-10-CM | POA: Diagnosis not present

## 2023-05-01 DIAGNOSIS — R072 Precordial pain: Secondary | ICD-10-CM | POA: Diagnosis not present

## 2023-05-01 NOTE — Patient Instructions (Signed)
 Medication Instructions:  Your Physician recommend you continue on your current medication as directed.    *If you need a refill on your cardiac medications before your next appointment, please call your pharmacy*   Lab Work: None ordered at this time  If you have labs (blood work) drawn today and your tests are completely normal, you will receive your results only by: MyChart Message (if you have MyChart) OR A paper copy in the mail If you have any lab test that is abnormal or we need to change your treatment, we will call you to review the results.   Testing/Procedures: Your provider has ordered a Lexiscan/ Exercise Myoview Stress test. This will take place at [facility]. Please report to the [facility] medical mall entrance. The volunteers at the first desk will direct you where to go.  [facility] MYOVIEW  Your provider has ordered a Stress Test with nuclear imaging. The purpose of this test is to evaluate the blood supply to your heart muscle. This procedure is referred to as a Non-Invasive Stress Test. This is because other than having an IV started in your vein, nothing is inserted or invades your body. Cardiac stress tests are done to find areas of poor blood flow to the heart by determining the extent of coronary artery disease (CAD). Some patients exercise on a treadmill, which naturally increases the blood flow to your heart, while others who are unable to walk on a treadmill due to physical limitations will have a pharmacologic/chemical stress agent called Lexiscan . This medicine will mimic walking on a treadmill by temporarily increasing your coronary blood flow.   Please note: these test may take anywhere between 2-4 hours to complete  How to prepare for your Myoview test:  Nothing to eat for 6 hours prior to the test No caffeine for 24 hours prior to test No smoking 24 hours prior to test. Your medication may be taken with water.  If your doctor stopped a medication because of this test,  do not take that medication. Ladies, please do not wear dresses.  Skirts or pants are appropriate. Please wear a short sleeve shirt. No perfume, cologne or lotion. Wear comfortable walking shoes. No heels!   PLEASE NOTIFY THE OFFICE AT LEAST 24 HOURS IN ADVANCE IF YOU ARE UNABLE TO KEEP YOUR APPOINTMENT.  [phone] AND  PLEASE NOTIFY NUCLEAR MEDICINE AT [facility] AT LEAST 24 HOURS IN ADVANCE IF YOU ARE UNABLE TO KEEP YOUR APPOINTMENT. [phone]    Follow-Up: At [facility], you and your health needs are our priority.  As part of our continuing mission to provide you with exceptional heart care, we have created designated Provider Care Teams.  These Care Teams include your primary Cardiologist (physician) and Advanced Practice Providers (APPs -  Physician Assistants and Nurse Practitioners) who all work together to provide you with the care you need, when you need it.  We recommend signing up for the patient portal called MyChart.  Sign up information is provided on this After Visit Summary.  MyChart is used to connect with patients for Virtual Visits (Telemedicine).  Patients are able to view lab/test results, encounter notes, upcoming appointments, etc.  Non-urgent messages can be sent to your provider as well.   To learn more about what you can do with MyChart, go to [email or web address].    Your next appointment:   3 month(s)  Provider:   You may see Deatrice Cage, MD or one of the following Advanced Practice Providers on your designated  Care Team:   Lonni Meager, NP Bernardino Bring, PA-C Cadence Franchester, PA-C Tylene Lunch, NP Barnie Hila, NP

## 2023-05-07 ENCOUNTER — Telehealth: Payer: Self-pay | Admitting: Cardiovascular Disease

## 2023-05-07 NOTE — Telephone Encounter (Signed)
 Call placed to the patient for clarification. He stated that he felt like the stress test was not needed at this time. He stated that he was currently asymptomatic and did not feel like it was necessary. He has been advised to call back if he changes his mind.

## 2023-05-07 NOTE — Telephone Encounter (Signed)
 Patient called and canceled NM MYO MULT SPECT W/WALL test stating he did not want this test.

## 2023-05-08 ENCOUNTER — Ambulatory Visit (INDEPENDENT_AMBULATORY_CARE_PROVIDER_SITE_OTHER): Payer: Medicare Other | Admitting: Podiatry

## 2023-05-08 ENCOUNTER — Encounter: Payer: Self-pay | Admitting: Podiatry

## 2023-05-08 VITALS — Ht 65.0 in | Wt 171.0 lb

## 2023-05-08 DIAGNOSIS — E119 Type 2 diabetes mellitus without complications: Secondary | ICD-10-CM | POA: Diagnosis not present

## 2023-05-08 DIAGNOSIS — M79674 Pain in right toe(s): Secondary | ICD-10-CM | POA: Diagnosis not present

## 2023-05-08 DIAGNOSIS — M79675 Pain in left toe(s): Secondary | ICD-10-CM

## 2023-05-08 DIAGNOSIS — B351 Tinea unguium: Secondary | ICD-10-CM

## 2023-05-12 ENCOUNTER — Emergency Department: Payer: Medicare Other

## 2023-05-12 ENCOUNTER — Other Ambulatory Visit: Payer: Self-pay

## 2023-05-12 DIAGNOSIS — Z7982 Long term (current) use of aspirin: Secondary | ICD-10-CM | POA: Insufficient documentation

## 2023-05-12 DIAGNOSIS — Z8673 Personal history of transient ischemic attack (TIA), and cerebral infarction without residual deficits: Secondary | ICD-10-CM | POA: Diagnosis not present

## 2023-05-12 DIAGNOSIS — Z85828 Personal history of other malignant neoplasm of skin: Secondary | ICD-10-CM | POA: Diagnosis not present

## 2023-05-12 DIAGNOSIS — I10 Essential (primary) hypertension: Secondary | ICD-10-CM | POA: Insufficient documentation

## 2023-05-12 DIAGNOSIS — R55 Syncope and collapse: Secondary | ICD-10-CM | POA: Diagnosis not present

## 2023-05-12 DIAGNOSIS — E119 Type 2 diabetes mellitus without complications: Secondary | ICD-10-CM | POA: Insufficient documentation

## 2023-05-12 DIAGNOSIS — I2510 Atherosclerotic heart disease of native coronary artery without angina pectoris: Secondary | ICD-10-CM | POA: Diagnosis not present

## 2023-05-12 DIAGNOSIS — E860 Dehydration: Secondary | ICD-10-CM | POA: Diagnosis not present

## 2023-05-12 DIAGNOSIS — N179 Acute kidney failure, unspecified: Secondary | ICD-10-CM | POA: Diagnosis not present

## 2023-05-12 DIAGNOSIS — R112 Nausea with vomiting, unspecified: Secondary | ICD-10-CM | POA: Diagnosis present

## 2023-05-12 DIAGNOSIS — K529 Noninfective gastroenteritis and colitis, unspecified: Secondary | ICD-10-CM | POA: Insufficient documentation

## 2023-05-12 DIAGNOSIS — Z7902 Long term (current) use of antithrombotics/antiplatelets: Secondary | ICD-10-CM | POA: Insufficient documentation

## 2023-05-12 LAB — CBC
HCT: 42.4 % (ref 39.0–52.0)
Hemoglobin: 13.7 g/dL (ref 13.0–17.0)
MCH: 29 pg (ref 26.0–34.0)
MCHC: 32.3 g/dL (ref 30.0–36.0)
MCV: 89.6 fL (ref 80.0–100.0)
Platelets: 211 10*3/uL (ref 150–400)
RBC: 4.73 MIL/uL (ref 4.22–5.81)
RDW: 19.6 % — ABNORMAL HIGH (ref 11.5–15.5)
WBC: 11.6 10*3/uL — ABNORMAL HIGH (ref 4.0–10.5)
nRBC: 0 % (ref 0.0–0.2)

## 2023-05-12 LAB — BASIC METABOLIC PANEL
Anion gap: 13 (ref 5–15)
BUN: 19 mg/dL (ref 8–23)
CO2: 20 mmol/L — ABNORMAL LOW (ref 22–32)
Calcium: 9.3 mg/dL (ref 8.9–10.3)
Chloride: 101 mmol/L (ref 98–111)
Creatinine, Ser: 1.51 mg/dL — ABNORMAL HIGH (ref 0.61–1.24)
GFR, Estimated: 45 mL/min — ABNORMAL LOW (ref >60)
Glucose, Bld: 129 mg/dL — ABNORMAL HIGH (ref 70–99)
Potassium: 4 mmol/L (ref 3.5–5.1)
Sodium: 134 mmol/L — ABNORMAL LOW (ref 135–145)

## 2023-05-12 LAB — HEPATIC FUNCTION PANEL
ALT: 27 U/L (ref 0–44)
AST: 38 U/L (ref 15–41)
Albumin: 4.5 g/dL (ref 3.5–5.0)
Alkaline Phosphatase: 54 U/L (ref 38–126)
Bilirubin, Direct: <0.1 mg/dL (ref 0.0–0.2)
Total Bilirubin: 0.8 mg/dL (ref 0.0–1.2)
Total Protein: 8 g/dL (ref 6.5–8.1)

## 2023-05-12 LAB — TROPONIN I (HIGH SENSITIVITY)
Troponin I (High Sensitivity): 13 ng/L (ref <18)
Troponin I (High Sensitivity): 14 ng/L (ref <18)

## 2023-05-12 LAB — LIPASE, BLOOD: Lipase: 28 U/L (ref 11–51)

## 2023-05-12 MED ORDER — ACETAMINOPHEN 325 MG PO TABS
650.0000 mg | ORAL_TABLET | Freq: Once | ORAL | Status: AC
  Administered 2023-05-12: 650 mg via ORAL
  Filled 2023-05-12: qty 2

## 2023-05-12 NOTE — ED Triage Notes (Signed)
 Pt to ED [facility] from home for GI sx started today. EMS reports pt started feeling very weak while using restroom

## 2023-05-12 NOTE — ED Notes (Signed)
 Attempted to call patients name multiple times with no answer; patient found in bathroom.

## 2023-05-12 NOTE — ED Provider Triage Note (Signed)
 Emergency Medicine Provider Triage Evaluation Note  DEANNA BOEHLKE III , a 83 y.o. male  was evaluated in triage.  Pt complains of hip pain, near syncopal episode.  Review of Systems  Positive:  Negative:   Physical Exam  BP (!) 129/92   Pulse (!) 54   Temp (!) 96 F (35.6 C) (Axillary)   Resp 18   SpO2 100%  Gen:   Awake, no distress   Resp:  Normal effort  MSK:   Moves extremities without difficulty  Other:    Medical Decision Making  Medically screening exam initiated at 4:41 PM.  Appropriate orders placed.  [name] LELON Sprang III was informed that the remainder of the evaluation will be completed by another provider, this initial triage assessment does not replace that evaluation, and the importance of remaining in the ED until their evaluation is complete.     Gasper Devere LELON, PA-C 05/12/23 1643

## 2023-05-12 NOTE — ED Triage Notes (Signed)
 Patient reports he was at home having a bowel movement and had a near syncopal episode; since arriving to ED patient has had multiple episodes of diarrhea.

## 2023-05-13 ENCOUNTER — Emergency Department
Admission: EM | Admit: 2023-05-13 | Discharge: 2023-05-13 | Disposition: A | Discharge status: Home / Self Care | Payer: Medicare Other | Attending: Emergency Medicine | Admitting: Emergency Medicine

## 2023-05-13 ENCOUNTER — Other Ambulatory Visit: Payer: Medicare Other

## 2023-05-13 ENCOUNTER — Emergency Department: Payer: Medicare Other

## 2023-05-13 DIAGNOSIS — R55 Syncope and collapse: Secondary | ICD-10-CM

## 2023-05-13 DIAGNOSIS — N179 Acute kidney failure, unspecified: Secondary | ICD-10-CM

## 2023-05-13 DIAGNOSIS — R112 Nausea with vomiting, unspecified: Secondary | ICD-10-CM

## 2023-05-13 DIAGNOSIS — E860 Dehydration: Secondary | ICD-10-CM

## 2023-05-13 DIAGNOSIS — K529 Noninfective gastroenteritis and colitis, unspecified: Secondary | ICD-10-CM

## 2023-05-13 LAB — MAGNESIUM: Magnesium: 2.2 mg/dL (ref 1.7–2.4)

## 2023-05-13 MED ORDER — ONDANSETRON 4 MG PO TBDP
4.0000 mg | ORAL_TABLET | Freq: Three times a day (TID) | ORAL | 0 refills | Status: DC | PRN
Start: 2023-05-13 — End: 2023-06-24

## 2023-05-13 MED ORDER — DROPERIDOL 2.5 MG/ML IJ SOLN
2.5000 mg | Freq: Once | INTRAMUSCULAR | Status: AC
  Administered 2023-05-13: 2.5 mg via INTRAVENOUS
  Filled 2023-05-13: qty 2

## 2023-05-13 MED ORDER — SODIUM CHLORIDE 0.9 % IV BOLUS
1000.0000 mL | Freq: Once | INTRAVENOUS | Status: AC
  Administered 2023-05-13: 1000 mL via INTRAVENOUS

## 2023-05-13 MED ORDER — ONDANSETRON HCL 4 MG/2ML IJ SOLN
4.0000 mg | Freq: Once | INTRAMUSCULAR | Status: AC
  Administered 2023-05-13: 4 mg via INTRAVENOUS
  Filled 2023-05-13: qty 2

## 2023-05-13 MED ORDER — SULFAMETHOXAZOLE-TRIMETHOPRIM 800-160 MG PO TABS
1.0000 | ORAL_TABLET | Freq: Once | ORAL | Status: AC
  Administered 2023-05-13: 1 via ORAL
  Filled 2023-05-13: qty 1

## 2023-05-13 MED ORDER — SULFAMETHOXAZOLE-TRIMETHOPRIM 800-160 MG PO TABS: 1.0000 | ORAL_TABLET | Freq: Two times a day (BID) | ORAL | 0 refills | Status: AC

## 2023-05-13 NOTE — ED Provider Notes (Signed)
 [facility] Provider Note    Event Date/Time   First MD Initiated Contact with Patient 05/13/23 0354     (approximate)   History   Near Syncope   HPI  Cory [name] is a 83 y.o. male   Past medical history of squamous cell carcinoma, CAD, diabetes, hypertension hyperlipidemia, previous stroke on Plavix  and aspirin  who presents to the emergency department with nausea vomiting and diarrhea for the past 24 hours.  Has had profuse watery diarrhea multiple episodes throughout the day and has finally started to let up while here in the emergency department.  He has felt lightheaded upon standing.  He thinks he is dehydrated.  He is feeling thirsty.  He has crampy abdominal pain.  He has no GI bleeding.  No international travel, hospitalizations or antibiotic use.  No known sick contacts.  No urinary symptoms or testicular pain.   External Medical Documents Reviewed: Cardiology office visit from January 2 document past medical history of medication list      Physical Exam   Triage Vital Signs: ED Triage Vitals  Encounter Vitals Group     BP 05/12/23 1637 (!) 129/92     Systolic BP Percentile --      Diastolic BP Percentile --      Pulse Rate 05/12/23 1637 (!) 54     Resp 05/12/23 1637 18     Temp 05/12/23 1639 (!) 96 F (35.6 C)     Temp Source 05/12/23 1639 Axillary     SpO2 05/12/23 1637 100 %     Weight --      Height --      Head Circumference --      Peak Flow --      Pain Score 05/12/23 1639 9     Pain Loc --      Pain Education --      Exclude from Growth Chart --     Most recent vital signs: Vitals:   05/13/23 0444 05/13/23 0544  BP: (!) 155/84 (!) 167/124  Pulse: 89 94  Resp: 16 15  Temp: 98 F (36.7 C)   SpO2: 96% 96%    General: Awake, no distress.  CV:  Good peripheral perfusion.  Resp:  Normal effort.  Abd:  No distention.  Other:  Awake alert comfortable, evaluated approximately 4 AM and is tired appearing,  slower to respond than his usual self, but answering questions appropriately and oriented.  He has no facial asymmetry, dysarthria, and motor or sensory intact.  He has left lower quadrant tenderness to palpation without rigidity or guarding.  He has cracked lips and dry mucous membranes looks dehydrated.   ED Results / Procedures / Treatments   Labs (all labs ordered are listed, but only abnormal results are displayed) Labs Reviewed  BASIC METABOLIC PANEL - Abnormal; Notable for the following components:      Result Value   Sodium 134 (*)    CO2 20 (*)    Glucose, Bld 129 (*)    Creatinine, Ser 1.51 (*)    GFR, Estimated 45 (*)    All other components within normal limits  CBC - Abnormal; Notable for the following components:   WBC 11.6 (*)    RDW 19.6 (*)    All other components within normal limits  GASTROINTESTINAL PANEL BY PCR, STOOL (REPLACES STOOL CULTURE)  C DIFFICILE QUICK SCREEN W PCR REFLEX    HEPATIC FUNCTION PANEL  LIPASE, BLOOD  MAGNESIUM   TROPONIN I (HIGH SENSITIVITY)  TROPONIN I (HIGH SENSITIVITY)     I ordered and reviewed the above labs they are notable for his creatinine is 1.51 which is increased from baseline.  His white blood cell count mildly elevated 11.6.  EKG  ED ECG REPORT I, Ginnie Shams, the attending physician, personally viewed and interpreted this ECG.   Date: 05/13/2023  EKG Time: 1647  Rate: 54  Rhythm: sinus  Axis: nl  Intervals: none  ST&T Change: no stemi  RADIOLOGY I independently reviewed and interpreted CT scan of the abdomen pelvis and see no obvious obstructive or inflammatory changes I also reviewed radiologist's formal read.   PROCEDURES:  Critical Care performed: No  Procedures   MEDICATIONS ORDERED IN ED: Medications  sulfamethoxazole -trimethoprim  (BACTRIM  DS) 800-160 MG per tablet 1 tablet (has no administration in time range)  acetaminophen  (TYLENOL ) tablet 650 mg (650 mg Oral Given by Other 05/12/23 1652)   sodium chloride  0.9 % bolus 1,000 mL (0 mLs Intravenous Stopped 05/13/23 0550)  ondansetron  (ZOFRAN ) injection 4 mg (4 mg Intravenous Given 05/13/23 0445)  droperidol  (INAPSINE ) 2.5 MG/ML injection 2.5 mg (2.5 mg Intravenous Given 05/13/23 0550)    IMPRESSION / MDM / ASSESSMENT AND PLAN / ED COURSE  I reviewed the triage vital signs and the nursing notes.                                Patient's presentation is most consistent with acute presentation with potential threat to life or bodily function.  Differential diagnosis includes, but is not limited to, viral gastroenteritis, colitis, dehydration or electrolyte derangement, intra-abdominal infection, appendicitis, diverticulitis, ischemic bowel   The patient is on the cardiac monitor to evaluate for evidence of arrhythmia and/or significant heart rate changes.  MDM:    Is a patient with gastroenteritis symptoms nausea vomiting diarrhea for 1 day that has started to slow down significantly now.  Given antiemetics, fluids for dehydration and AKI.  He has tenderness in the left lower quadrant but CAT scan shows no acute abnormalities aside for some inflammatory changes consistent with colitis of descending colon.  Given white blood cell count, age, pain, dehydration we will treat with antibiotics Bactrim  5-day course.  No further vomiting here and no further diarrheal illness during the majority of his workup while roomed in the ED.  I doubt mesenteric ischemia given no pain out of proportion.  Patient stable after IV fluids, antiemetic, and plan will be for discharge home with antibiotics and close PMD follow-up.  Return precautions are given.       FINAL CLINICAL IMPRESSION(S) / ED DIAGNOSES   Final diagnoses:  Nausea vomiting and diarrhea  Near syncope  Dehydration  AKI (acute kidney injury) (HCC)  Colitis     Rx / DC Orders   ED Discharge Orders          Ordered    ondansetron  (ZOFRAN -ODT) 4 MG disintegrating tablet   Every 8 hours PRN        05/13/23 0600    sulfamethoxazole -trimethoprim  (BACTRIM  DS) 800-160 MG tablet  2 times daily        05/13/23 0600             Note:  This document was prepared using Dragon voice recognition software and may include unintentional dictation errors.    Shams Ginnie, MD 05/13/23 [phone]

## 2023-05-13 NOTE — ED Notes (Signed)
 Patient states he suddenly felt nauseous. Assisted the patient to sit on the side of  the bed. Patient then stated he needed to void. Patient was given a urinal. This nurse stood with the patient due to him being a fall risk. Patient is now resting in bed.

## 2023-05-13 NOTE — ED Notes (Signed)
 Patient is very sleepy. Did not drive tot he hospital and wife does not drive. Called patients daughter to pick him up from the hospital. Called twice with no answer. Left a message.

## 2023-05-13 NOTE — ED Notes (Signed)
 Back from CT

## 2023-05-13 NOTE — ED Notes (Signed)
With ct 

## 2023-05-13 NOTE — Discharge Instructions (Signed)
 Take Zofran  as needed for nausea.  Drink plenty of fluids to stay well-hydrated.  Find Pedialyte or similar electrolyte rehydration formulas at your local pharmacy.  Take antibiotics for 5 days.  Thank you for choosing us  for your health care today!  Please see your primary doctor this week for a follow up appointment.   If you have any new, worsening, or unexpected symptoms call your doctor right away or come back to the emergency department for reevaluation.  It was my pleasure to care for you today.   Ginnie EDISON Cyrena, MD

## 2023-05-16 ENCOUNTER — Encounter: Payer: Self-pay | Admitting: Podiatry

## 2023-05-16 NOTE — Progress Notes (Signed)
  Subjective:  Patient ID: Cory [name], male    DOB: July 08, 1940,  MRN: [id number]  Cory [name] presents to clinic today for: preventative diabetic foot care and painful thick toenails that are difficult to trim. Pain interferes with ambulation. Aggravating factors include wearing enclosed shoe gear. Pain is relieved with periodic professional debridement.  Chief Complaint  Patient presents with   Nail Problem    Pt is here for RFC not a diabetic PCP is Dr Toney Reil and LOV was in December.    PCP is Eden Emms, NP.  Allergies  Allergen Reactions   Fesoterodine Other (See Comments)    Dry mouth and constipation   Isosorbide Other (See Comments)   Other Other (See Comments)   Grass Pollen(K-O-R-T-Swt Vern) Other (See Comments)   Hydrocodone Anxiety   Meperidine Other (See Comments) and Rash    Other reaction(s): Other (See Comments) HALLUCINATIONS    Penicillins Rash    Review of Systems: Negative except as noted in the HPI.  Objective: No changes noted in today's physical examination. There were no vitals filed for this visit.  Cory [name] is a pleasant 83 y.o. male in NAD. AAO x 3.  Vascular Examination: Capillary refill time <3 seconds b/l LE. Palpable pedal pulses b/l LE. Digital hair present b/l. No pedal edema b/l. Skin temperature gradient WNL b/l. No varicosities b/l. Marland Kitchen  Dermatological Examination: Pedal skin with normal turgor, texture and tone b/l. No open wounds. No interdigital macerations b/l. Toenails 1-5 b/l thickened, discolored, dystrophic with subungual debris. There is pain on palpation to dorsal aspect of nailplates. No corns, calluses nor porokeratotic lesions noted..  Neurological Examination: Protective sensation intact with 10 gram monofilament b/l LE. Vibratory sensation intact b/l LE.   Musculoskeletal Examination: Muscle strength 5/5 to all lower extremity muscle groups bilaterally. Limited joint ROM to the right lower  extremity.      Latest Ref Rng & Units 04/28/2023   12:22 PM 12/27/2022   11:53 AM 08/27/2022   11:49 AM 05/28/2022   11:54 AM  Hemoglobin A1C  Hemoglobin-A1c 4.0 - 5.6 % 5.9  6.5  6.2  5.8    Assessment/Plan: 1. Pain due to onychomycosis of toenails of both feet   2. Diabetes mellitus without complication (HCC)     -Consent given for treatment as described below: -Examined patient. -Continue foot and shoe inspections daily. Monitor blood glucose per PCP/Endocrinologist's recommendations. -Mycotic toenails 1-5 bilaterally were debrided in length and girth with sterile nail nippers and dremel without incident. -Patient/POA to call should there be question/concern in the interim.   Return in about 9 weeks (around 07/10/2023).  Cory [name], DPM      [place] LOCATION: [address] [place] [address]                   Office [phone]   [facility] LOCATION: [address] [place], [place] [address] Office [phone]

## 2023-05-19 ENCOUNTER — Telehealth: Payer: Self-pay

## 2023-05-19 NOTE — Progress Notes (Signed)
Transition Care Management Follow-up Telephone Call Date of discharge and from where: 05/13/2023 [facility] How have you been since you were released from the hospital? Patient stated he is feeling better. Any questions or concerns? No  Items Reviewed: Did the pt receive and understand the discharge instructions provided? Yes  Medications obtained and verified? Yes  Other? No  Any new allergies since your discharge? No  Dietary orders reviewed? Yes Do you have support at home? Yes   Follow up appointments reviewed:  PCP Hospital f/u appt confirmed? Yes  Scheduled to see Genene Churn. Toney Reil, NP on 05/26/2023 @ [facility] [place]. Specialist Hospital f/u appt confirmed? No  Scheduled to see on  @ . Are transportation arrangements needed? No  If their condition worsens, is the pt aware to call PCP or go to the Emergency Dept.? Yes Was the patient provided with contact information for the PCP's office or ED? Yes Was to pt encouraged to call back with questions or concerns? Yes   Oletta Buehring [facility]  [facility] Guide Direct Dial: [phone]  Fax: [phone] Website: [email or web address]

## 2023-05-26 ENCOUNTER — Telehealth: Payer: Self-pay | Admitting: Nurse Practitioner

## 2023-05-26 ENCOUNTER — Ambulatory Visit (INDEPENDENT_AMBULATORY_CARE_PROVIDER_SITE_OTHER): Payer: Medicare Other | Admitting: Nurse Practitioner

## 2023-05-26 VITALS — BP 148/60 | HR 76 | Temp 98.0°F | Ht 65.0 in | Wt 167.6 lb

## 2023-05-26 DIAGNOSIS — R053 Chronic cough: Secondary | ICD-10-CM

## 2023-05-26 DIAGNOSIS — I10 Essential (primary) hypertension: Secondary | ICD-10-CM | POA: Diagnosis not present

## 2023-05-26 DIAGNOSIS — Z760 Encounter for issue of repeat prescription: Secondary | ICD-10-CM

## 2023-05-26 MED ORDER — LOSARTAN POTASSIUM 25 MG PO TABS: 25.0000 mg | ORAL_TABLET | Freq: Every day | ORAL | 0 refills | Status: DC

## 2023-05-26 MED ORDER — NITROGLYCERIN 0.4 MG SL SUBL: 0.4000 mg | SUBLINGUAL_TABLET | SUBLINGUAL | 0 refills | Status: DC | PRN

## 2023-05-26 NOTE — Assessment & Plan Note (Signed)
Patient having a chronic cough we will switch lisinopril to losartan.

## 2023-05-26 NOTE — Patient Instructions (Signed)
Nice to see you today Go back to protonix once a day I am stopping the lisinopril and starting losartan Follow up with me in 6 weeks

## 2023-05-26 NOTE — Assessment & Plan Note (Addendum)
History of same thought to be secondary to GERD.  Did increase current medication with resolving heartburn with cough still present.  Patient is also on Flonase and Singulair.  Will discontinue lisinopril 20 mg start losartan 25 mg daily.  If no resolve consider pulmonary referral

## 2023-05-26 NOTE — Telephone Encounter (Signed)
Dr. Kirke Corin  I saw Bill in office today.  He has been dealing with a chronic cough thought secondary to GERD increased his GERD medication with resolve in reflux but the cough did not change.  I have switched his lisinopril to losartan.  Already sent a prescription and canceled the other just wanted to give you a heads up

## 2023-05-26 NOTE — Progress Notes (Signed)
Established Patient Office Visit  Subjective   Patient ID: Cory [name], male    DOB: 1940/10/29  Age: 83 y.o. MRN: [id number]  Chief Complaint  Patient presents with   Follow-up    Pt complains of feeling better. Pt states he is still coughing a good bit and states that gerd has been consistent as well.     HPI  GERD: Patient was seen by me on 04/28/2023 where we increased patient's Protonix from 40 mg daily to 40 mg twice daily patient also taking famotidine 20 g daily.  He is having breakthrough symptoms at that juncture is here for recheck.  Multiple falls: Patient has a history of multiple falls due to dizziness.  Does have a history of CVA and TIAs.  He does use a walking aid.  Chronic cough: Patient had a cough recently.  Time with increased PPI last time patient is on H2 also here for follow-up today.  Of note patient is on lisinopril 20 mg also. Cough     Review of Systems  Constitutional:  Negative for chills and fever.  Respiratory:  Negative for shortness of breath.   Cardiovascular:  Negative for chest pain.  Gastrointestinal:  Negative for abdominal pain and heartburn.  Musculoskeletal:  Negative for falls.  Neurological:  Negative for dizziness and headaches.  Psychiatric/Behavioral:  Negative for hallucinations and suicidal ideas.       Objective:     BP (!) 148/60   Pulse 76   Temp 98 F (36.7 C) (Oral)   Ht 5\' 5"  (1.651 m)   Wt 167 lb 9.6 oz (76 kg)   SpO2 97%   BMI 27.89 kg/m    Physical Exam Vitals and nursing note reviewed.  Constitutional:      Appearance: Normal appearance.  Cardiovascular:     Rate and Rhythm: Normal rate and regular rhythm.     Heart sounds: Normal heart sounds.  Pulmonary:     Effort: Pulmonary effort is normal.     Breath sounds: Normal breath sounds.  Abdominal:     General: Bowel sounds are normal. There is no distension.     Palpations: There is no mass.     Tenderness: There is no abdominal  tenderness.     Hernia: No hernia is present.  Lymphadenopathy:     Cervical: No cervical adenopathy.  Neurological:     Mental Status: He is alert.      No results found for any visits on 05/26/23.    The ASCVD Risk score (Arnett DK, et al., 2019) failed to calculate for the following reasons:   The 2019 ASCVD risk score is only valid for ages 53 to 46   Risk score cannot be calculated because patient has a medical history suggesting prior/existing ASCVD    Assessment & Plan:   Problem List Items Addressed This Visit       Cardiovascular and Mediastinum   Hypertension   Patient having a chronic cough we will switch lisinopril to losartan.      Relevant Medications   hydrochlorothiazide (HYDRODIURIL) 25 MG tablet   nitroGLYCERIN (NITROSTAT) 0.4 MG SL tablet   losartan (COZAAR) 25 MG tablet     Other   Chronic cough - Primary   History of same thought to be secondary to GERD.  Did increase current medication with resolving heartburn with cough still present.  Patient is also on Flonase and Singulair.  Will discontinue lisinopril 20 mg start losartan 25 mg  daily.  If no resolve consider pulmonary referral      Other Visit Diagnoses       Medication refill       Relevant Medications   nitroGLYCERIN (NITROSTAT) 0.4 MG SL tablet       Return in about 6 weeks (around 07/07/2023) for BP recheck.    Audria Nine, NP

## 2023-05-27 NOTE — Telephone Encounter (Signed)
Great.  Thanks

## 2023-05-29 ENCOUNTER — Ambulatory Visit: Payer: Medicare Other | Admitting: Dermatology

## 2023-05-31 ENCOUNTER — Other Ambulatory Visit: Payer: Self-pay | Admitting: Nurse Practitioner

## 2023-05-31 DIAGNOSIS — Z760 Encounter for issue of repeat prescription: Secondary | ICD-10-CM

## 2023-06-09 ENCOUNTER — Other Ambulatory Visit (HOSPITAL_COMMUNITY): Payer: Self-pay

## 2023-06-20 ENCOUNTER — Other Ambulatory Visit: Payer: Self-pay | Admitting: *Deleted

## 2023-06-20 DIAGNOSIS — D509 Iron deficiency anemia, unspecified: Secondary | ICD-10-CM

## 2023-06-23 ENCOUNTER — Inpatient Hospital Stay: Payer: Medicare Other | Attending: Oncology

## 2023-06-23 ENCOUNTER — Encounter: Payer: Self-pay | Admitting: Oncology

## 2023-06-23 DIAGNOSIS — I10 Essential (primary) hypertension: Secondary | ICD-10-CM | POA: Insufficient documentation

## 2023-06-23 DIAGNOSIS — D509 Iron deficiency anemia, unspecified: Secondary | ICD-10-CM | POA: Insufficient documentation

## 2023-06-23 LAB — IRON AND TIBC
Iron: 57 ug/dL (ref 45–182)
Saturation Ratios: 18 % (ref 17.9–39.5)
TIBC: 326 ug/dL (ref 250–450)
UIBC: 269 ug/dL

## 2023-06-23 LAB — CBC WITH DIFFERENTIAL/PLATELET
Abs Immature Granulocytes: 0.01 10*3/uL (ref 0.00–0.07)
Basophils Absolute: 0 10*3/uL (ref 0.0–0.1)
Basophils Relative: 1 %
Eosinophils Absolute: 0.3 10*3/uL (ref 0.0–0.5)
Eosinophils Relative: 4 %
HCT: 37 % — ABNORMAL LOW (ref 39.0–52.0)
Hemoglobin: 11.7 g/dL — ABNORMAL LOW (ref 13.0–17.0)
Immature Granulocytes: 0 %
Lymphocytes Relative: 21 %
Lymphs Abs: 1.3 10*3/uL (ref 0.7–4.0)
MCH: 30.2 pg (ref 26.0–34.0)
MCHC: 31.6 g/dL (ref 30.0–36.0)
MCV: 95.4 fL (ref 80.0–100.0)
Monocytes Absolute: 0.7 10*3/uL (ref 0.1–1.0)
Monocytes Relative: 11 %
Neutro Abs: 3.9 10*3/uL (ref 1.7–7.7)
Neutrophils Relative %: 63 %
Platelets: 170 10*3/uL (ref 150–400)
RBC: 3.88 MIL/uL — ABNORMAL LOW (ref 4.22–5.81)
RDW: 14.5 % (ref 11.5–15.5)
WBC: 6.2 10*3/uL (ref 4.0–10.5)
nRBC: 0 % (ref 0.0–0.2)

## 2023-06-23 LAB — FERRITIN: Ferritin: 43 ng/mL (ref 24–336)

## 2023-06-24 ENCOUNTER — Inpatient Hospital Stay: Payer: Medicare Other

## 2023-06-24 ENCOUNTER — Inpatient Hospital Stay (HOSPITAL_BASED_OUTPATIENT_CLINIC_OR_DEPARTMENT_OTHER): Payer: Medicare Other | Admitting: Oncology

## 2023-06-24 ENCOUNTER — Encounter: Payer: Self-pay | Admitting: Oncology

## 2023-06-24 VITALS — BP 161/77 | HR 50 | Temp 97.1°F | Resp 16 | Ht 65.0 in | Wt 166.2 lb

## 2023-06-24 DIAGNOSIS — D509 Iron deficiency anemia, unspecified: Secondary | ICD-10-CM

## 2023-06-24 NOTE — Progress Notes (Signed)
 [facility]  [phone] [phone]  ID: Cory [name] OB: 08-28-1940  MR#: [id number]  XLK#:[id number]  Patient Care Team: Eden Emms, NP as PCP - General (Nurse Practitioner) Iran Ouch, MD as PCP - Cardiology (Cardiology) Vanna Scotland, MD as Consulting Physician (Urology) Kathyrn Sheriff, [name] (Inactive) as Pharmacist (Pharmacist) Lockie Mola, MD as Referring Physician (Ophthalmology)  CHIEF COMPLAINT: Iron deficiency anemia.  INTERVAL HISTORY: Patient returns to clinic today for repeat laboratory work, further evaluation, and consideration of additional IV Venofer.  He has left hip pain, but otherwise feels well.  He has not complained of any weakness or fatigue.  He has no neurologic complaints.  He has a good appetite and denies weight loss.  He has no chest pain, shortness of breath, cough, or hemoptysis.  He denies any nausea, vomiting, constipation, or diarrhea.  He has no melena or hematochezia.  He has no urinary complaints.  Patient offers no further specific complaints today.  REVIEW OF SYSTEMS:   Review of Systems  Constitutional: Negative.  Negative for fever, malaise/fatigue and weight loss.  Respiratory: Negative.  Negative for cough, hemoptysis and shortness of breath.   Cardiovascular: Negative.  Negative for chest pain and leg swelling.  Gastrointestinal: Negative.  Negative for abdominal pain, blood in stool and melena.  Genitourinary: Negative.  Negative for hematuria.  Musculoskeletal:  Positive for joint pain. Negative for back pain.  Skin: Negative.  Negative for rash.  Neurological: Negative.  Negative for dizziness, focal weakness, weakness and headaches.  Psychiatric/Behavioral: Negative.  The patient is not nervous/anxious.     As per HPI. Otherwise, a complete review of systems is negative.  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   Left scalp - EDC   Stroke (HCC)    on Plavix and ASA   Vasovagal syncope     PAST SURGICAL HISTORY: Past Surgical History:  Procedure Laterality Date   CHOLECYSTECTOMY     CORONARY ARTERY BYPASS GRAFT  2000   DEBRIDEMENT AND CLOSURE WOUND N/A 01/19/2021   Procedure: Closure of scalp defect;  Surgeon: Allena Napoleon, MD;  Location: [facility];  Service: Plastics;  Laterality: N/A;   MOHS SURGERY     NECK SURGERY      FAMILY HISTORY: Family History  Problem Relation Age of Onset   Heart Problems Mother    Heart disease Mother    Heart Problems Father    Heart attack Father    Heart disease Brother     ADVANCED DIRECTIVES (Y/N):  N  HEALTH MAINTENANCE: Social History   Tobacco Use   Smoking status: Former    Types: Pipe   Smokeless tobacco: Never  Vaping Use   Vaping status: Never Used  Substance Use Topics   Alcohol use: Not Currently   Drug use: Never     Colonoscopy:  PAP:  Bone density:  Lipid panel:  Allergies  Allergen Reactions   Fesoterodine Other (See Comments)    Dry mouth and constipation   Isosorbide Other (See Comments)  Other Other (See Comments)   Grass Pollen(K-O-R-T-Swt Vern) Other (See Comments)   Hydrocodone Anxiety   Meperidine Other (See Comments) and Rash    Other reaction(s): Other (See Comments) HALLUCINATIONS    Penicillins Rash    Current Outpatient Medications  Medication Sig Dispense Refill   acetaminophen (TYLENOL) 500 MG tablet Take 1,000 mg by mouth at bedtime.     amLODipine (NORVASC) 5 MG tablet Take 5 mg by mouth daily.     aspirin EC 81 MG tablet Take 1 tablet (81 mg total) by mouth daily.  Swallow whole. 90 tablet 3   atorvastatin (LIPITOR) 40 MG tablet TAKE 1 TABLET EVERY DAY 90 tablet 3   BETIMOL 0.5 % ophthalmic solution Apply to eye.     brimonidine (ALPHAGAN) 0.2 % ophthalmic solution 1 drop 2 (two) times daily.     carvedilol (COREG) 6.25 MG tablet TAKE 1 TABLET TWICE DAILY (STOP CLONIDINE) 180 tablet 0   clopidogrel (PLAVIX) 75 MG tablet Take 1 tablet (75 mg total) by mouth daily. 90 tablet 3   Coenzyme Q10 10 MG capsule Take by mouth.     cycloSPORINE (RESTASIS) 0.05 % ophthalmic emulsion 1 drop 2 (two) times a day.     diclofenac Sodium (VOLTAREN) 1 % GEL Apply 2 g topically as needed.     dorzolamide-timolol (COSOPT) 2-0.5 % ophthalmic solution 1 drop 2 (two) times daily.     escitalopram (LEXAPRO) 10 MG tablet Take 10 mg by mouth daily.     famotidine (PEPCID) 20 MG tablet Take 20 mg by mouth at bedtime.     fluticasone (FLONASE) 50 MCG/ACT nasal spray Place 2 sprays into both nostrils daily. 48 g 3   latanoprost (XALATAN) 0.005 % ophthalmic solution daily.     losartan (COZAAR) 25 MG tablet Take 1 tablet (25 mg total) by mouth daily. 90 tablet 0   LUMIGAN 0.01 % SOLN 1 drop at bedtime.     montelukast (SINGULAIR) 10 MG tablet Take 1 tablet (10 mg total) by mouth at bedtime. 90 tablet 3   nitroGLYCERIN (NITROSTAT) 0.4 MG SL tablet Place 1 tablet (0.4 mg total) under the tongue every 5 (five) minutes as needed. 5 tablet 0   pantoprazole (PROTONIX) 40 MG tablet Take 1 tablet (40 mg total) by mouth 2 (two) times daily. 60 tablet 0   tamsulosin (FLOMAX) 0.4 MG CAPS capsule Take 1 capsule (0.4 mg total) by mouth daily. 90 capsule 3   traMADol (ULTRAM) 50 MG tablet      No current facility-administered medications for this visit.    OBJECTIVE: Vitals:   06/24/23 1400  BP: (!) 161/77  Pulse: (!) 50  Resp: 16  Temp: (!) 97.1 F (36.2 C)  SpO2: 97%     Body mass index is 27.66 kg/m.    ECOG FS:0 - Asymptomatic  General: Well-developed, well-nourished, no acute  distress. Eyes: Pink conjunctiva, anicteric sclera. HEENT: Normocephalic, moist mucous membranes. Lungs: No audible wheezing or coughing. Heart: Regular rate and rhythm. Abdomen: Soft, nontender, no obvious distention. Musculoskeletal: No edema, cyanosis, or clubbing. Neuro: Alert, answering all questions appropriately. Cranial nerves grossly intact. Skin: No rashes or petechiae noted. Psych: Normal affect.  LAB RESULTS:  Lab Results  Component Value Date   NA 134 (L) 05/12/2023   K 4.0 05/12/2023   CL 101 05/12/2023   CO2 20 (L) 05/12/2023   GLUCOSE 129 (H) 05/12/2023   BUN 19 05/12/2023   CREATININE 1.51 (H) 05/12/2023   CALCIUM 9.3  05/12/2023   PROT 8.0 05/12/2023   ALBUMIN 4.5 05/12/2023   AST 38 05/12/2023   ALT 27 05/12/2023   ALKPHOS 54 05/12/2023   BILITOT 0.8 05/12/2023   GFRNONAA 45 (L) 05/12/2023   GFRAA 98 03/13/2020    Lab Results  Component Value Date   WBC 6.2 06/23/2023   NEUTROABS 3.9 06/23/2023   HGB 11.7 (L) 06/23/2023   HCT 37.0 (L) 06/23/2023   MCV 95.4 06/23/2023   PLT 170 06/23/2023   Lab Results  Component Value Date   IRON 57 06/23/2023   TIBC 326 06/23/2023   IRONPCTSAT 18 06/23/2023   Lab Results  Component Value Date   FERRITIN 43 06/23/2023     STUDIES: No results found.  ASSESSMENT: Iron deficiency anemia.  PLAN:    Iron deficiency anemia: Essentially resolved.  Patient's most recent hemoglobin is 11.7 and his iron stores are now within normal limits.  He cannot tolerate oral iron supplementation.  He has not had a colonoscopy or EGD in several years.  No intervention is needed.  Patient does not require additional IV Venofer today.  He last received treatment on March 13, 2023.  Return to clinic in 3 months with repeat laboratory work, further evaluation, and continuation of treatment if needed.   Hypertension: Chronic and unchanged.  Patient's blood pressure is moderately elevated today.  Continue monitoring and  treatment per primary care. Joint pain: Continue symptomatic treatment.   Patient expressed understanding and was in agreement with this plan. He also understands that He can call clinic at any time with any questions, concerns, or complaints.    Jeralyn Ruths, MD   06/24/2023 2:15 PM

## 2023-06-25 ENCOUNTER — Encounter: Payer: Self-pay | Admitting: Dermatology

## 2023-06-25 ENCOUNTER — Ambulatory Visit: Payer: Medicare Other | Admitting: Dermatology

## 2023-06-25 DIAGNOSIS — W908XXA Exposure to other nonionizing radiation, initial encounter: Secondary | ICD-10-CM | POA: Diagnosis not present

## 2023-06-25 DIAGNOSIS — D492 Neoplasm of unspecified behavior of bone, soft tissue, and skin: Secondary | ICD-10-CM | POA: Diagnosis not present

## 2023-06-25 DIAGNOSIS — L570 Actinic keratosis: Secondary | ICD-10-CM | POA: Diagnosis not present

## 2023-06-25 DIAGNOSIS — C44319 Basal cell carcinoma of skin of other parts of face: Secondary | ICD-10-CM

## 2023-06-25 DIAGNOSIS — L578 Other skin changes due to chronic exposure to nonionizing radiation: Secondary | ICD-10-CM

## 2023-06-25 DIAGNOSIS — C4491 Basal cell carcinoma of skin, unspecified: Secondary | ICD-10-CM

## 2023-06-25 DIAGNOSIS — L219 Seborrheic dermatitis, unspecified: Secondary | ICD-10-CM | POA: Diagnosis not present

## 2023-06-25 HISTORY — DX: Basal cell carcinoma of skin, unspecified: C44.91

## 2023-06-25 NOTE — Progress Notes (Signed)
 Follow-Up Visit   Subjective  Cory [name] is a 83 y.o. male who presents for the following: 4 months f/u on precancers on his face, scalp and arms, patient had a red light treatment on his face 01/21/2023, he has also used 5FU/Calcipotriene cream to help treat precancers. Patient c/o a rash on his face and behind his ears.  The patient has spots, moles and lesions to be evaluated, some may be new or changing and the patient may have concern these could be cancer.   The following portions of the chart were reviewed this encounter and updated as appropriate: medications, allergies, medical history  Review of Systems:  No other skin or systemic complaints except as noted in HPI or Assessment and Plan.  Objective  Well appearing patient in no apparent distress; mood and affect are within normal limits.  A focused examination was performed of the following areas:face,scalp,arms   Relevant exam findings are noted in the Assessment and Plan.  central right forehead 12 mm pink papule  with scale and crust       right temple x 1, right cheek x1, right forehead x 1, left forehead x 1, right side burn x 1, right hand x 1, left lateral elbow x 1 (7) Erythematous thin papules/macules with gritty scale.   Assessment & Plan   SEBORRHEIC DERMATITIS Exam: Pink patches with greasy scale at alar creases/perinasal, eyebrows, postauricular sulci  Chronic and persistent condition with duration or expected duration over one year. Condition is symptomatic/ bothersome to patient. Not currently at goal.   Seborrheic Dermatitis is a chronic persistent rash characterized by pinkness and scaling most commonly of the mid face but also can occur on the scalp (dandruff), ears; mid chest, mid back and groin.  It tends to be exacerbated by stress and cooler weather.  People who have neurologic disease may experience new onset or exacerbation of existing seborrheic dermatitis.  The condition is not curable  but treatable and can be controlled.  Treatment Plan: Patient decline treatment    NEOPLASM OF SKIN central right forehead Skin / nail biopsy Type of biopsy: tangential   Informed consent: discussed and consent obtained   Patient was prepped and draped in usual sterile fashion: area prepped with alochol. Anesthesia: the lesion was anesthetized in a standard fashion   Anesthetic:  1% lidocaine w/ epinephrine 1-100,000 buffered w/ 8.4% NaHCO3 Instrument used: flexible razor blade   Hemostasis achieved with: pressure, aluminum chloride and electrodesiccation   Outcome: patient tolerated procedure well   Post-procedure details: wound care instructions given   Post-procedure details comment:  Ointment and small bandage Specimen 1 - Surgical pathology Differential Diagnosis: R/O SCC  Check Margins: No R/O SCC AK (ACTINIC KERATOSIS) (7) right temple x 1, right cheek x1, right forehead x 1, left forehead x 1, right side burn x 1, right hand x 1, left lateral elbow x 1 (7) Actinic keratoses are precancerous spots that appear secondary to cumulative UV radiation exposure/sun exposure over time. They are chronic with expected duration over 1 year. A portion of actinic keratoses will progress to squamous cell carcinoma of the skin. It is not possible to reliably predict which spots will progress to skin cancer and so treatment is recommended to prevent development of skin cancer.  Recommend daily broad spectrum sunscreen SPF 30+ to sun-exposed areas, reapply every 2 hours as needed.  Recommend staying in the shade or wearing long sleeves, sun glasses (UVA+UVB protection) and wide brim hats (4-inch  brim around the entire circumference of the hat). Call for new or changing lesions.   Recheck area treated on the left lateral elbow at next office visit if not gone away we will consider biopsy Destruction of lesion - right temple x 1, right cheek x1, right forehead x 1, left forehead x 1, right side  burn x 1, right hand x 1, left lateral elbow x 1 (7) Complexity: simple   Destruction method: cryotherapy   Informed consent: discussed and consent obtained   Timeout:  patient name, date of birth, surgical site, and procedure verified Lesion destroyed using liquid nitrogen: Yes   Region frozen until ice ball extended beyond lesion: Yes   Outcome: patient tolerated procedure well with no complications   Post-procedure details: wound care instructions given   ACTINIC ELASTOSIS   SEBORRHEIC DERMATITIS     Return in about 5 months (around 11/22/2023) for TBSE, Aks, skin cancers, recheck left elbow.  IAngelique Holm, CMA, am acting as scribe for Elie Goody, MD .   Documentation: I have reviewed the above documentation for accuracy and completeness, and I agree with the above.  Elie Goody, MD

## 2023-06-25 NOTE — Patient Instructions (Addendum)

## 2023-06-26 ENCOUNTER — Other Ambulatory Visit: Payer: Self-pay | Admitting: Nurse Practitioner

## 2023-06-26 ENCOUNTER — Other Ambulatory Visit: Payer: Self-pay | Admitting: Cardiovascular Disease

## 2023-06-26 DIAGNOSIS — I10 Essential (primary) hypertension: Secondary | ICD-10-CM

## 2023-06-26 LAB — SURGICAL PATHOLOGY

## 2023-06-26 NOTE — Telephone Encounter (Signed)
 Call placed to the patient concerning the Amlodipine. Per Epic, the patient is on 5 mg but the refill came in for 2.5 mg once daily. The patient stated that he "thinks" he has been on the 5 mg once daily as that is what the bottle he has says.

## 2023-06-26 NOTE — Telephone Encounter (Signed)
 Please advise if ok to refill Amlodipine. Last filled by Date: 06/19/2022 Department: [facility] Documenting: Vertis Kelch, CMA Authorizing: [provider]

## 2023-06-29 ENCOUNTER — Other Ambulatory Visit: Payer: Self-pay | Admitting: Cardiovascular Disease

## 2023-06-30 ENCOUNTER — Telehealth: Payer: Self-pay

## 2023-06-30 DIAGNOSIS — C44319 Basal cell carcinoma of skin of other parts of face: Secondary | ICD-10-CM

## 2023-06-30 NOTE — Telephone Encounter (Signed)
 Discussed pathology results and treatment plan. Patient voiced understanding. Referral sent to Dr. Teresita Madura office.

## 2023-06-30 NOTE — Telephone Encounter (Signed)
-----   Message from [place] sent at 06/26/2023  6:23 PM EST ----- Diagnosis: central right forehead :       BASAL CELL CARCINOMA, NODULAR AND INFILTRATIVE PATTERNS    Please call with diagnosis and determine where the patient would like to have Mohs surgery.  Explanation: your biopsy shows a basal cell skin cancer in the second layer of the skin. This is the most common kind of skin cancer and is caused by damage from sun exposure. Basal cell skin cancers almost never spread beyond the skin, so they are not dangerous to your overall health. However, they will continue to grow, can bleed, cause nonhealing wounds, and disrupt nearby structures unless fully treated.  Treatment: Given the location and type of skin cancer, I recommend Mohs surgery. Mohs surgery involves cutting out the skin cancer and then checking under the microscope to ensure the whole skin cancer was removed. If any skin cancer remains, the surgeon will cut out more until it is fully removed. The cure rate is about 98-99%. Once the Mohs surgeon confirms the skin cancer is out, they will discuss the options to repair or heal the area. You must take it easy for about two weeks after surgery (no lifting over 10-15 lbs, avoid activity to get your heart rate and blood pressure up). It is done at another office outside of [place] ([place], [place], or [place]).

## 2023-07-07 ENCOUNTER — Ambulatory Visit (INDEPENDENT_AMBULATORY_CARE_PROVIDER_SITE_OTHER): Payer: Medicare Other | Admitting: Nurse Practitioner

## 2023-07-07 ENCOUNTER — Encounter: Payer: Self-pay | Admitting: Nurse Practitioner

## 2023-07-07 VITALS — BP 144/64 | HR 59 | Temp 97.7°F | Ht 65.0 in | Wt 167.0 lb

## 2023-07-07 DIAGNOSIS — R0981 Nasal congestion: Secondary | ICD-10-CM

## 2023-07-07 DIAGNOSIS — R053 Chronic cough: Secondary | ICD-10-CM | POA: Diagnosis not present

## 2023-07-07 DIAGNOSIS — I10 Essential (primary) hypertension: Secondary | ICD-10-CM | POA: Diagnosis not present

## 2023-07-07 MED ORDER — LOSARTAN POTASSIUM 50 MG PO TABS: 50.0000 mg | ORAL_TABLET | Freq: Every day | ORAL | 0 refills | Status: DC

## 2023-07-07 MED ORDER — FLUTICASONE PROPIONATE 50 MCG/ACT NA SUSP: 2.0000 | Freq: Every day | NASAL | 3 refills | Status: AC

## 2023-07-07 MED ORDER — CETIRIZINE HCL 10 MG PO TABS: 10.0000 mg | ORAL_TABLET | Freq: Every day | ORAL | 3 refills | Status: DC

## 2023-07-07 NOTE — Progress Notes (Signed)
 Established Patient Office Visit  Subjective   Patient ID: Cory [name], male    DOB: February 06, 1941  Age: 83 y.o. MRN: [id number]  Chief Complaint  Patient presents with   Hypertension    Patient here today for BP check. Hasn't been checking BP at home but was high at oncology appt.       Chronic cough: patient was seen by me on 05/26/2023. His GERD medication was increased with a resolution of heartburn but the cough continue. He was on an ACEi and that was switched to an ARB. He is here for a follow up.  States that the cough has improved but has some nasal that has been long standing. States that it got worse when he was started on 20 mg. Lisinopril. State that the switch to losartan did not help with the nasal congestoin States that he uses flonase and states that he has a bottle at home but unsure If there is any medication in it. States that when he ha used the flonase in the past it has helped some with the nasal congestion. H eis on singular      Review of Systems  Constitutional:  Negative for chills and fever.  HENT:  Positive for sinus pain (pressure).   Respiratory:  Negative for shortness of breath.   Cardiovascular:  Negative for chest pain.  Neurological:  Negative for headaches.  Psychiatric/Behavioral:  Negative for hallucinations and suicidal ideas.       Objective:     BP (!) 144/64 (BP Location: Left Arm, Patient Position: Sitting, Cuff Size: Normal)   Pulse (!) 59   Temp 97.7 F (36.5 C) (Oral)   Ht 5\' 5"  (1.651 m)   Wt 167 lb (75.8 kg)   SpO2 95%   BMI 27.79 kg/m  BP Readings from Last 3 Encounters:  07/07/23 (!) 144/64  06/24/23 (!) 161/77  05/26/23 (!) 148/60   Wt Readings from Last 3 Encounters:  07/07/23 167 lb (75.8 kg)  06/24/23 166 lb 3.2 oz (75.4 kg)  05/26/23 167 lb 9.6 oz (76 kg)   SpO2 Readings from Last 3 Encounters:  07/07/23 95%  06/24/23 97%  05/26/23 97%      Physical Exam Vitals and nursing note reviewed.   Constitutional:      Appearance: Normal appearance.  Cardiovascular:     Rate and Rhythm: Normal rate and regular rhythm.     Heart sounds: Normal heart sounds.  Pulmonary:     Effort: Pulmonary effort is normal.     Breath sounds: Normal breath sounds.  Musculoskeletal:     Cervical back: Neck supple.  Neurological:     Mental Status: He is alert.      No results found for any visits on 07/07/23.    The ASCVD Risk score (Arnett DK, et al., 2019) failed to calculate for the following reasons:   The 2019 ASCVD risk score is only valid for ages 59 to 2   Risk score cannot be calculated because patient has a medical history suggesting prior/existing ASCVD    Assessment & Plan:   Problem List Items Addressed This Visit       Cardiovascular and Mediastinum   Hypertension - Primary   Patient currently maintained on losartan 25 mg, carvedilol 6.25 mg twice daily, amlodipine 5 mg daily.  Blood pressure still uncontrolled we will increase losartan to 50 mg daily continue all other medication as prescribed.      Relevant Medications  losartan (COZAAR) 50 MG tablet   Other Relevant Orders   Basic Metabolic Panel     Other   Chronic cough   Improvement in cough since switching off of ACE inhibitor.  Will continue ARB      Nasal congestion   Refill fluticasone nasal spray along with adding on second-generation antihistamine.  Patient to continue taking Singulair last 10 mg nightly.      Relevant Medications   fluticasone (FLONASE) 50 MCG/ACT nasal spray    Return in about 6 weeks (around 08/18/2023) for BP recheck.    Audria Nine, NP

## 2023-07-07 NOTE — Assessment & Plan Note (Signed)
 Patient currently maintained on losartan 25 mg, carvedilol 6.25 mg twice daily, amlodipine 5 mg daily.  Blood pressure still uncontrolled we will increase losartan to 50 mg daily continue all other medication as prescribed.

## 2023-07-07 NOTE — Patient Instructions (Signed)
 Nice to see you today I will be in touch with the labs You can take 2 tablets of the losartan 25mg  to equal 50mg  total. I have sent a new prescription to the pharmacy  Follow up with me in 6 weeks

## 2023-07-07 NOTE — Assessment & Plan Note (Signed)
 Improvement in cough since switching off of ACE inhibitor.  Will continue ARB

## 2023-07-07 NOTE — Assessment & Plan Note (Signed)
 Refill fluticasone nasal spray along with adding on second-generation antihistamine.  Patient to continue taking Singulair last 10 mg nightly.

## 2023-07-08 LAB — BASIC METABOLIC PANEL
BUN: 13 mg/dL (ref 6–23)
CO2: 28 meq/L (ref 19–32)
Calcium: 9.1 mg/dL (ref 8.4–10.5)
Chloride: 101 meq/L (ref 96–112)
Creatinine, Ser: 1.21 mg/dL (ref 0.40–1.50)
GFR: 55.1 mL/min — ABNORMAL LOW (ref >60.00)
Glucose, Bld: 98 mg/dL (ref 70–99)
Potassium: 4.8 meq/L (ref 3.5–5.1)
Sodium: 135 meq/L (ref 135–145)

## 2023-07-09 ENCOUNTER — Encounter: Payer: Self-pay | Admitting: Nurse Practitioner

## 2023-07-11 ENCOUNTER — Ambulatory Visit: Payer: Self-pay | Admitting: Nurse Practitioner

## 2023-07-11 ENCOUNTER — Ambulatory Visit (INDEPENDENT_AMBULATORY_CARE_PROVIDER_SITE_OTHER): Admitting: Family Medicine

## 2023-07-11 VITALS — BP 140/60 | HR 50 | Temp 98.0°F | Ht 65.0 in | Wt 165.2 lb

## 2023-07-11 DIAGNOSIS — R197 Diarrhea, unspecified: Secondary | ICD-10-CM | POA: Diagnosis not present

## 2023-07-11 DIAGNOSIS — R58 Hemorrhage, not elsewhere classified: Secondary | ICD-10-CM | POA: Diagnosis not present

## 2023-07-11 MED ORDER — GEMTESA 75 MG PO TABS: 75.0000 mg | ORAL_TABLET | Freq: Every day | ORAL | Status: DC

## 2023-07-11 NOTE — Patient Instructions (Addendum)
 Go to the lab on the way out.   If you have mychart we'll likely use that to update you.    Hold aspirin in the meantime.   Brush your teeth gently starting tomorrow.   Don't floss or use a water pick for now or use listerine.    I'll check with [facility] in the meantime.

## 2023-07-11 NOTE — Telephone Encounter (Signed)
Noted. Will see today. Thanks.

## 2023-07-11 NOTE — Telephone Encounter (Signed)
 Chief Complaint: Blood in mouth / intermittent diarrhea Symptoms: see above Frequency: today Pertinent Negatives: Patient denies vomiting Disposition: [] ED /[] Urgent Care (no appt availability in office) / [x] Appointment(In office/virtual)/ []  [place] [facility]/ [] Home Care/ [] Refused Recommended Disposition /[] [place] [facility]/ []  Follow-up with PCP Additional Notes: Patient called in to request appt for intermittent diarrhea, and today he woke up with blood in his mouth. Patient questioned whether this blood occurred with teeth brushing or coughing up bloody phlegm, but patient was unable to fully describe and states "I just had blood all in my mouth." Patient uncertain if his symptoms are related to recent medication change of double dose of Losartan. Patient appt made for today for further evaluation.   Copied from CRM [id number]. Topic: Clinical - Red Word Triage >> Jul 11, 2023 11:30 AM Drema Balzarine wrote: Red Word that prompted transfer to Nurse Triage: Patient experiencing blood in mouth and occasional diarrhea, doesn't know if it has to do with his medications Reason for Disposition  MILD-MODERATE diarrhea (e.g., 1-6 times / day more than normal)  Additional Information  Commented on: [1] Gum bleeding AND [2] taking Coumadin (warfarin) or other strong blood thinner, or known bleeding disorder (e.g., thrombocytopenia)    Patient has blood in mouth  Answer Assessment - Initial Assessment Questions 1. DIARRHEA SEVERITY: "How bad is the diarrhea?" "How many more stools have you had in the past 24 hours than normal?"    - NO DIARRHEA (SCALE 0)   - MILD (SCALE 1-3): Few loose or mushy BMs; increase of 1-3 stools over normal daily number of stools; mild increase in ostomy output.   -  MODERATE (SCALE 4-7): Increase of 4-6 stools daily over normal; moderate increase in ostomy output.   -  SEVERE (SCALE 8-10; OR "WORST POSSIBLE"): Increase of 7 or more stools daily over normal; moderate  increase in ostomy output; incontinence.     1 2. ONSET: "When did the diarrhea begin?"      Ongoing and intermittent since January 3. BM CONSISTENCY: "How loose or watery is the diarrhea?"      N/a 4. VOMITING: "Are you also vomiting?" If Yes, ask: "How many times in the past 24 hours?"      No 5. ABDOMEN PAIN: "Are you having any abdomen pain?" If Yes, ask: "What does it feel like?" (e.g., crampy, dull, intermittent, constant)      No 6. ABDOMEN PAIN SEVERITY: If present, ask: "How bad is the pain?"  (e.g., Scale 1-10; mild, moderate, or severe)   - MILD (1-3): doesn't interfere with normal activities, abdomen soft and not tender to touch    - MODERATE (4-7): interferes with normal activities or awakens from sleep, abdomen tender to touch    - SEVERE (8-10): excruciating pain, doubled over, unable to do any normal activities       No 7. ORAL INTAKE: If vomiting, "Have you been able to drink liquids?" "How much liquids have you had in the past 24 hours?"     No 8. HYDRATION: "Any signs of dehydration?" (e.g., dry mouth [not just dry lips], too weak to stand, dizziness, new weight loss) "When did you last urinate?"     Eating and drinking normally  10. ANTIBIOTIC USE: "Are you taking antibiotics now or have you taken antibiotics in the past 2 months?"       No 11. OTHER SYMPTOMS: "Do you have any other symptoms?" (e.g., fever, blood in stool)  Switches between constipation to diarrhea  Answer Assessment - Initial Assessment Questions 1. SYMPTOM: "What's the main symptom you're concerned about?" (e.g., chapped lips, dry mouth, lump, sores)     Blood in mouth 2. ONSET: "When did the  blood in mouth  start?"     Today 3. PAIN: "Is there any pain?" If Yes, ask: "How bad is it?" (Scale: 1-10; mild, moderate, severe)   - MILD (1-3):  doesn't interfere with eating or normal activities   - MODERATE (4-7): interferes with eating some solids and normal activities   - SEVERE (8-10):   excruciating pain, interferes with most normal activities   - SEVERE DYSPHAGIA: can't swallow liquids, drooling     No pain 4. CAUSE: "What do you think is causing the symptoms?"     Unsure  5. OTHER SYMPTOMS: "Do you have any other symptoms?" (e.g., fever, sore throat, toothache, swelling)     No other symptoms besides blood in mouth  Protocols used: Diarrhea-A-AH, Mouth Symptoms-A-AH

## 2023-07-11 NOTE — Progress Notes (Signed)
 Not on metformin currently.    Meds reviewed at [facility].   Had blood in mouth.  On aspirin and plavix.  First saw blood in saliva over the last few days.  More blood in mouth today, after using listerine.  No other known bleeding except for some occ epistaxis.  H/o CVA 2015.  No blood in urine or stool. H/o hemorrhoid.    Occ diarrhea.  If it happens, more likely to be around/after eating breakfast.  He has consistent breakfast from day to day.  H/o cholecystectomy.  Diarrhea going on episodically since ~2021.  He can have fecal urgency.  Stools are usually soft, not oily.  No black stools. No abd pain.   Meds, vitals, and allergies reviewed.   ROS: Per HPI unless specifically indicated in ROS section   Nad Ncat MMM Possible bleeding side on the R upper gumline. No active bleeding.   Nasal exam w/o blood seen.  Neck supple.  No lymphadenopathy Rrr Ctab Skin well-perfused.  Senile purpura noted.  30 minutes were devoted to patient care in this encounter (this includes time spent reviewing the patient's file/history, interviewing and examining the patient, counseling/reviewing plan with patient).

## 2023-07-12 LAB — CBC WITH DIFFERENTIAL/PLATELET
Absolute Lymphocytes: 1556 {cells}/uL (ref 850–3900)
Absolute Monocytes: 622 {cells}/uL (ref 200–950)
Basophils Absolute: 49 {cells}/uL (ref 0–200)
Basophils Relative: 0.8 %
Eosinophils Absolute: 201 {cells}/uL (ref 15–500)
Eosinophils Relative: 3.3 %
HCT: 35.6 % — ABNORMAL LOW (ref 38.5–50.0)
Hemoglobin: 11.8 g/dL — ABNORMAL LOW (ref 13.2–17.1)
MCH: 30.7 pg (ref 27.0–33.0)
MCHC: 33.1 g/dL (ref 32.0–36.0)
MCV: 92.7 fL (ref 80.0–100.0)
MPV: 11.5 fL (ref 7.5–12.5)
Monocytes Relative: 10.2 %
Neutro Abs: 3672 {cells}/uL (ref 1500–7800)
Neutrophils Relative %: 60.2 %
Platelets: 171 10*3/uL (ref 140–400)
RBC: 3.84 10*6/uL — ABNORMAL LOW (ref 4.20–5.80)
RDW: 12.2 % (ref 11.0–15.0)
Total Lymphocyte: 25.5 %
WBC: 6.1 10*3/uL (ref 3.8–10.8)

## 2023-07-12 LAB — PROTIME-INR
INR: 1.1
Prothrombin Time: 11.9 s — ABNORMAL HIGH (ref 9.0–11.5)

## 2023-07-13 DIAGNOSIS — R197 Diarrhea, unspecified: Secondary | ICD-10-CM | POA: Insufficient documentation

## 2023-07-13 DIAGNOSIS — R58 Hemorrhage, not elsewhere classified: Secondary | ICD-10-CM | POA: Insufficient documentation

## 2023-07-13 NOTE — Assessment & Plan Note (Signed)
 Longstanding with history of cholecystectomy, unclear if that is contributing.  I did not want to change his medications for this yet since we are working on the recent episode of bleeding.  Unguinal route this to PCP for input, with appreciation.

## 2023-07-13 NOTE — Assessment & Plan Note (Signed)
 It looks like he has a possible bleeding site on the right upper gumline but there is no active bleeding and no ulceration seen.  He had used a Waterpik in the past.  He could have had local irritation from that that bled, especially with aspirin and Plavix use.  Discussed options.  At this point still okay for outpatient follow-up.  Hold aspirin in the meantime.  Continue Plavix for now.  I asked him to brush his teeth gently starting tomorrow.   Don't floss or use a water pick for now or use listerine.

## 2023-07-14 ENCOUNTER — Other Ambulatory Visit: Payer: Self-pay

## 2023-07-14 ENCOUNTER — Ambulatory Visit: Payer: Medicare Other | Admitting: Podiatry

## 2023-07-14 MED ORDER — GEMTESA 75 MG PO TABS: 75.0000 mg | ORAL_TABLET | Freq: Every day | ORAL | 6 refills | Status: DC

## 2023-07-15 ENCOUNTER — Other Ambulatory Visit: Payer: Self-pay | Admitting: Family Medicine

## 2023-07-15 MED ORDER — CHOLESTYRAMINE 4 G PO PACK: 4.0000 g | PACK | Freq: Three times a day (TID) | ORAL | Status: DC | PRN

## 2023-07-15 MED ORDER — CHOLESTYRAMINE 4 G PO PACK: 4.0000 g | PACK | Freq: Three times a day (TID) | ORAL | 0 refills | Status: DC | PRN

## 2023-07-16 ENCOUNTER — Encounter: Payer: Self-pay | Admitting: Dermatology

## 2023-07-16 ENCOUNTER — Ambulatory Visit (INDEPENDENT_AMBULATORY_CARE_PROVIDER_SITE_OTHER): Admitting: Dermatology

## 2023-07-16 VITALS — BP 181/61 | HR 54

## 2023-07-16 DIAGNOSIS — L579 Skin changes due to chronic exposure to nonionizing radiation, unspecified: Secondary | ICD-10-CM | POA: Diagnosis not present

## 2023-07-16 DIAGNOSIS — C44319 Basal cell carcinoma of skin of other parts of face: Secondary | ICD-10-CM | POA: Diagnosis not present

## 2023-07-16 DIAGNOSIS — L814 Other melanin hyperpigmentation: Secondary | ICD-10-CM

## 2023-07-16 DIAGNOSIS — C4491 Basal cell carcinoma of skin, unspecified: Secondary | ICD-10-CM | POA: Insufficient documentation

## 2023-07-16 HISTORY — DX: Basal cell carcinoma of skin, unspecified: C44.91

## 2023-07-16 MED ORDER — TRAMADOL HCL 50 MG PO TABS: 50.0000 mg | ORAL_TABLET | Freq: Four times a day (QID) | ORAL | 0 refills | Status: DC | PRN

## 2023-07-16 NOTE — Patient Instructions (Signed)

## 2023-07-16 NOTE — Progress Notes (Signed)
 Follow-Up Visit   Subjective  Cory [name] is a 83 y.o. 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 Katrinka Blazing.  The following portions of the chart were reviewed this encounter and updated as appropriate: medications, allergies, medical history  Review of Systems:  No other skin or systemic complaints except as noted in HPI or Assessment and Plan.  Objective  Well appearing patient in no apparent distress; mood and affect are within normal limits.  A focused examination was performed of the following areas: Central right forehead Relevant physical exam findings are noted in the Assessment and Plan.   central Right Forehead Pink pearly papule or plaque with arborizing vessels.    Assessment & Plan   BASAL CELL CARCINOMA (BCC), UNSPECIFIED SITE central Right Forehead Mohs surgery  Consent obtained: written  Anticoagulation: Is the patient taking prescription anticoagulant and/or aspirin prescribed/recommended by a physician? Yes   Was the anticoagulation regimen changed prior to Mohs? No    Anesthesia: Anesthesia method: local infiltration Local anesthetic: lidocaine 1% WITH epi  Procedure Details: Timeout: pre-procedure verification complete Procedure Prep: patient was prepped and draped in usual sterile fashion Prep type: chlorhexidine Biopsy accession number: [id number] Biopsy lab: [facility] Frozen section biopsy performed: No   Specimen debulked: No   Pre-Op diagnosis: basal cell carcinoma BCC subtype: infiltrative and nodular MohsAIQ Surgical site (if tumor spans multiple areas, please select predominant area): forehead (non-eyebrow) Surgery side: right Surgical site (from skin exam): central Right Forehead Pre-operative length (cm): 1.3 Pre-operative width (cm): 0.8 Indications for Mohs surgery: anatomic location where tissue conservation is critical Previously  treated? No    Micrographic Surgery Details: Post-operative length (cm): 2.6 Post-operative width (cm): 2.7 Number of Mohs stages: 2 Is this a complex case (associate members only): No    Stage 1    Tumor features identified on Mohs section: basal carcinoma    Depth of defect after stage: subcutaneous fat    Perineural invasion: no perineural invasion  Stage 2    Tumor features identified on Mohs section: no tumor identified    Depth of defect after stage: subcutaneous fat    Perineural invasion: no perineural invasion  Patient tolerance of procedure: tolerated well, no immediate complications  Reconstruction: Was the defect reconstructed? Yes   Was reconstruction performed by the same Mohs surgeon? Yes   Setting of reconstruction: outpatient office When was reconstruction performed? same day Type of reconstruction: flap Type of flap: advancement   Flap area (cm2): 14  Opioids: Did the patient receive a prescription for opioid/narcotic related to Mohs surgery? Yes   Indications for opioid/narcotics: patient required additional pain relief despite trial of non-opioid analgesia  Antibiotics: Does patient meet AHA guidelines for endocarditis?: No   Does patient meet AHA guidelines for orthopedic prophylaxis?: No   Were antibiotics given on the day of surgery?: No   Did surgery breach mucosa, expose cartilage/bone, involve an area of lymphedema/inflamed/infected tissue? No   Related Medications traMADol (ULTRAM) 50 MG tablet Take 1 tablet (50 mg total) by mouth every 6 (six) hours as needed for up to 8 doses.   Return in about 8 days (around 07/24/2023) for suture removal.  I, Manual Meier, Surg Tech [name], am acting as scribe for Gwenith Daily, MD.    07/16/2023  HISTORY OF PRESENT ILLNESS  Cory [name] is seen in consultation at the request of Dr. Katrinka Blazing  for biopsy-proven Nodular and Infiltrative Basal Cell Carcinoma of the central mid forehead. They note that the  area has been present for about 12 months increasing in size with time.  There is no history of previous treatment.  Reports no other new or changing lesions and has no other complaints today.  Medications and allergies: see patient chart.  Review of systems: Reviewed 8 systems and notable for the above skin cancer.  All other systems reviewed are unremarkable/negative, unless noted in the HPI. Past medical history, surgical history, family history, social history were also reviewed and are noted in the chart/questionnaire.    PHYSICAL EXAMINATION  General: Well-appearing, in no acute distress, alert and oriented x 4. Vitals reviewed in chart (if available).   Skin: Exam reveals a 1.3 x 0.8 cm erythematous papule and biopsy scar on the central mid forehead. There are rhytids, telangiectasias, and lentigines, consistent with photodamage.  Biopsy report(s) reviewed, confirming the diagnosis.   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 Due to location, size, histology, or recurrence and the likelihood of subclinical extension as well as the need to conserve normal surrounding tissue, the patient was deemed acceptable for Mohs micrographic surgery (MMS).  The nature and purpose of the procedure, associated benefits and risks including recurrence and scarring, possible complications such as pain, infection, and bleeding, and alternative methods of treatment if appropriate were discussed with the patient during consent. The lesion location was verified by the patient, by reviewing previous notes, pathology reports, and by photographs as well as angulation measurements if available.  Informed consent was reviewed and signed by the patient, and timeout was performed at 11:15 AM. See op note below.  2. For the photodamage and solar lentigines, sun protection discussed/information given on OTC sunscreens, and  we recommend continued regular follow-up with primary dermatologist every 6 months or sooner for any growing, bleeding, or changing lesions. 3. Prognosis and future surveillance discussed. 4. Letter with treatment outcome sent to referring provider. 5. Pain acetaminophen/tramadol 50 mg   MOHS MICROGRAPHIC SURGERY AND RECONSTRUCTION  Initial size:   1.3 x 0.8 cm Surgical defect/wound size: 2.6 x 2.7 cm Anesthesia:    0.33% lidocaine with 1:200,000 epinephrine EBL:    <5 mL Complications:  None Repair type:   Adjacent Tissue Transfer (Tripolar Advancement Flap SQ suture:   4-0 Vicryl Cutaneous suture:  5-0 Polyprolene Final size of the repair: 4.8 x 3.0 = 14.4 cm ^2   Stages: 2  STAGE I: Anesthesia achieved with 0.5% lidocaine with 1:200,000 epinephrine. ChloraPrep applied. 1 section(s) excised using Mohs technique (this includes total peripheral and deep tissue margin excision and evaluation with frozen sections, excised and interpreted by the same physician). The tumor was first debulked and then excised with an approx. 2 mm margin.  Hemostasis was achieved with electrocautery as needed.  The specimen was then oriented, subdivided/relaxed, inked, and processed using Mohs technique.    Frozen section analysis revealed a positive margin for islands of cells with peripheral palisading and a haphazard arrangement of the more central cells in the peripheral margin.    STAGE II: An additional 2 mm margin was excised.  Hemostasis was achieved with electrocautery as needed.  The specimen was then oriented, subdivided/relaxed, inked, and processed using Mohs technique. Evaluation of slides by the Mohs surgeon revealed clear tumor margins.   Reconstruction  PROCEDURE: Advancement Flap The nature of  the procedure was discussed with the patient in detail, including alternatives.  The risks discussed included but not limited to potential for infection, bleeding, scar formation, and damage to  underlying structures.  The patient understood the risks and signed the consent form (scanned into chart).  This wound was reconstructed with an advancement flap.  Local anesthesia was achieved with the anesthetic indicated above.  The operative site was prepped with a surgical antiseptic solution, and then draped with sterile towels to insure a sterile field.  The beveled edges of the wound were excised to 90 degrees relative to the surface skin plane.  The wound was undermined in all directions, and meticulous hemostasis was achieved with an electrosurgical device.  Relaxing incisions were made, if necessary, for lateral tension release.  The tissue was advanced and closed centrally, and redundant tissue was trimmed as necessary.  The wound was sutured in a layered fashion to close potential dead space and to precisely and securely approximate the wound edges.  The dimensions of the flap were:  4.8 cm x 3.0 cm for a total flap surface area of 14.4 centimeters squared (cm2).    The wound was covered with petrolatum and a dressing.  The patient understands the need to return immediately for any signs of infection to include swelling, pain, purulent discharge, localized warmth, or fever.  Contact information was provided to the patient (including after-hours pager numbers)    Documentation: I have reviewed the above documentation for accuracy and completeness, and I agree with the above.  Gwenith Daily, MD

## 2023-07-17 ENCOUNTER — Ambulatory Visit (INDEPENDENT_AMBULATORY_CARE_PROVIDER_SITE_OTHER): Payer: Medicare Other | Admitting: Podiatry

## 2023-07-17 DIAGNOSIS — Z0189 Encounter for other specified special examinations: Secondary | ICD-10-CM

## 2023-07-17 DIAGNOSIS — B351 Tinea unguium: Secondary | ICD-10-CM | POA: Diagnosis not present

## 2023-07-17 DIAGNOSIS — M79674 Pain in right toe(s): Secondary | ICD-10-CM

## 2023-07-17 DIAGNOSIS — M79675 Pain in left toe(s): Secondary | ICD-10-CM

## 2023-07-17 DIAGNOSIS — E119 Type 2 diabetes mellitus without complications: Secondary | ICD-10-CM | POA: Diagnosis not present

## 2023-07-17 DIAGNOSIS — R600 Localized edema: Secondary | ICD-10-CM | POA: Diagnosis not present

## 2023-07-17 DIAGNOSIS — M2142 Flat foot [pes planus] (acquired), left foot: Secondary | ICD-10-CM

## 2023-07-17 DIAGNOSIS — E1151 Type 2 diabetes mellitus with diabetic peripheral angiopathy without gangrene: Secondary | ICD-10-CM

## 2023-07-17 DIAGNOSIS — M2141 Flat foot [pes planus] (acquired), right foot: Secondary | ICD-10-CM | POA: Diagnosis not present

## 2023-07-17 NOTE — Progress Notes (Signed)
 ANNUAL DIABETIC FOOT EXAM  Subjective: Cory [name] presents today for annual diabetic foot exam. He has had Mohs surgery on his forehead for skin cancer.  Patient denies any h/o foot wounds.  Eden Emms, NP is patient's PCP. LOV 07/07/2023.  Past Medical History:  Diagnosis Date   Anxiety    Basal cell carcinoma 06/25/2023   Central right forehead. Nodular and infiltrative. Mohs pending.   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   Left scalp - EDC   Stroke (HCC)    on Plavix and ASA   Vasovagal syncope    Patient Active Problem List   Diagnosis Date Noted   BCC (basal cell carcinoma of skin) 07/16/2023   Bleeding 07/13/2023   Diarrhea 07/13/2023   Nasal congestion 07/07/2023   Fall 04/28/2023   Chronic cough 04/28/2023   Skin tear of elbow without complication, right, initial encounter 04/28/2023   Orthopnea 12/27/2022   Fatigue 12/27/2022   Breast pain, left 06/19/2022   Gastroesophageal reflux disease 06/19/2022   Chest pain 02/25/2022   Lightheadedness 02/25/2022   DDD (degenerative disc disease), 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   Left hip pain 12/17/2021   Acute left-sided low back pain without sciatica 12/17/2021   Chronic diastolic (congestive) heart failure (HCC) 12/06/2021   Hypomagnesemia 10/20/2021   Elevated troponin 10/20/2021   Junctional rhythm 10/20/2021   Frequent falls 10/20/2021   PAD/carotid stenosis  10/20/2021   Constipation due to outlet dysfunction 07/04/2021   Iron deficiency anemia, unspecified 05/02/2021   Hyponatremia 05/02/2021   Mild cognitive  impairment 05/02/2021   Type 2 diabetes mellitus with hyperglycemia, without long-term current use of insulin (HCC) 05/02/2021   History of basal cell carcinoma (BCC) 08/30/2020   Trigger index finger of left hand 07/10/2020   Aneurysm (HCC) 05/11/2020   Osteoarthritis 04/13/2020   Hypertension 04/13/2020   CAD (coronary artery disease), native coronary artery 04/13/2020   History of CVA with residual deficit 04/13/2020   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) 04/13/2020   Hiatal hernia 04/13/2020   Seasonal allergies 04/13/2020   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   Nocturia 04/13/2020   Lower extremity edema 04/13/2020   Enthesopathy of elbow region 12/25/2005   Full thickness rotator cuff tear 11/22/2004   Cervical spondylosis without myelopathy 09/20/2004   Past Surgical History:  Procedure Laterality Date   CHOLECYSTECTOMY     CORONARY ARTERY BYPASS GRAFT  2000   DEBRIDEMENT AND CLOSURE WOUND N/A 01/19/2021   Procedure: Closure of scalp defect;  Surgeon: Allena Napoleon, MD;  Location: [facility];  Service: Plastics;  Laterality: N/A;   MOHS SURGERY     NECK SURGERY     Current Outpatient Medications on File Prior to Visit  Medication Sig Dispense Refill   acetaminophen (TYLENOL) 500 MG tablet Take 1,000 mg by mouth at bedtime.     amLODipine (NORVASC) 5 MG tablet Take 5 mg by mouth daily.     aspirin EC 81 MG tablet Take 1 tablet (81  mg total) by mouth daily. Swallow whole. 90 tablet 3   atorvastatin (LIPITOR) 40 MG tablet TAKE 1 TABLET EVERY DAY 90 tablet 3   BETIMOL 0.5 % ophthalmic solution Apply to eye.     brimonidine (ALPHAGAN) 0.2 % ophthalmic solution 1 drop 2 (two) times daily.     carvedilol (COREG) 6.25 MG tablet TAKE 1 TABLET TWICE DAILY (STOP CLONIDINE) 180 tablet 3   cetirizine (ZYRTEC) 10 MG tablet Take 1 tablet (10 mg total) by mouth daily. (Patient not taking: Reported on 07/11/2023) 90 tablet 3    cholestyramine (QUESTRAN) 4 g packet Take 1 packet (4 g total) by mouth 3 (three) times daily with meals as needed. 90 packet 0   clopidogrel (PLAVIX) 75 MG tablet Take 1 tablet (75 mg total) by mouth daily. 90 tablet 3   Coenzyme Q10 10 MG capsule Take by mouth.     cycloSPORINE (RESTASIS) 0.05 % ophthalmic emulsion 1 drop 2 (two) times a day.     diclofenac Sodium (VOLTAREN) 1 % GEL Apply 2 g topically as needed.     dorzolamide-timolol (COSOPT) 2-0.5 % ophthalmic solution 1 drop 2 (two) times daily.     escitalopram (LEXAPRO) 10 MG tablet Take 10 mg by mouth daily.     famotidine (PEPCID) 20 MG tablet Take 20 mg by mouth at bedtime.     fluticasone (FLONASE) 50 MCG/ACT nasal spray Place 2 sprays into both nostrils daily. 48 g 3   latanoprost (XALATAN) 0.005 % ophthalmic solution daily.     losartan (COZAAR) 50 MG tablet Take 1 tablet (50 mg total) by mouth daily. 90 tablet 0   montelukast (SINGULAIR) 10 MG tablet Take 1 tablet (10 mg total) by mouth at bedtime. 90 tablet 3   nitroGLYCERIN (NITROSTAT) 0.4 MG SL tablet Place 1 tablet (0.4 mg total) under the tongue every 5 (five) minutes as needed. 5 tablet 0   pantoprazole (PROTONIX) 40 MG tablet Take 1 tablet (40 mg total) by mouth 2 (two) times daily. 60 tablet 0   tamsulosin (FLOMAX) 0.4 MG CAPS capsule Take 1 capsule (0.4 mg total) by mouth daily. 90 capsule 3   traMADol (ULTRAM) 50 MG tablet      traMADol (ULTRAM) 50 MG tablet Take 1 tablet (50 mg total) by mouth every 6 (six) hours as needed for up to 8 doses. 8 tablet 0   Vibegron (GEMTESA) 75 MG TABS Take 1 tablet (75 mg total) by mouth daily. 30 tablet 6   No current facility-administered medications on file prior to visit.    Allergies  Allergen Reactions   Fesoterodine Other (See Comments)    Dry mouth and constipation   Isosorbide Other (See Comments)   Other Other (See Comments)   Grass Pollen(K-O-R-T-Swt Vern) Other (See Comments)   Hydrocodone Anxiety   Meperidine Other  (See Comments) and Rash    Other reaction(s): Other (See Comments) HALLUCINATIONS    Penicillins Rash   Social History   Occupational History   Occupation: retired  Tobacco Use   Smoking status: Former    Types: Pipe   Smokeless tobacco: Never  Advertising account planner   Vaping status: Never Used  Substance and Sexual Activity   Alcohol use: Not Currently   Drug use: Never   Sexual activity: Yes   Family History  Problem Relation Age of Onset   Heart Problems Mother    Heart disease Mother    Heart Problems Father    Heart attack Father  Heart disease Brother    Immunization History  Administered Date(s) Administered   DTaP 01/24/2020   Fluad Quad(high Dose 65+) 01/28/2020, 01/29/2021, 01/16/2022   PFIZER(Purple Top)SARS-COV-2 Vaccination 05/25/2019, 06/15/2019   Pneumococcal Conjugate-13 05/10/2013   Pneumococcal Polysaccharide-23 12/03/2005   Td 12/15/2018   Tdap 04/12/2011, 04/04/2021   Zoster Recombinant(Shingrix) 10/17/2017, 12/23/2017     Review of Systems: Negative except as noted in the HPI.   Objective: There were no vitals filed for this visit.  LEIB ELAHI [name] is a pleasant 83 y.o. male in NAD. AAO X 3.  Diabetic foot exam was performed with the following findings:   Vascular Examination: CFT <4 seconds b/l. DP pulses diminished b/l. PT pulses diminished b/l. Digital hair absent. Skin temperature gradient warm to cool b/l. No ischemia or gangrene. No cyanosis or clubbing noted b/l. No claudication or rest pain symptoms..   Neurological Examination: Vibratory sensation intact b/l. Protective sensation decreased with 10 gram monofilament b/l.  Dermatological Examination: Pedal skin thin, shiny and atrophic b/l. No open wounds. No interdigital macerations.   Toenails 1-5 b/l thick, discolored, elongated with subungual debris and pain on dorsal palpation.   No corns, calluses nor porokeratotic lesions noted.  Musculoskeletal Examination: Muscle strength  5/5 to all lower extremity muscle groups LLE; decreased dorsiflextion of RLE. Pes planus deformity noted bilateral LE. Utilizes cane for ambulation assistance.  Radiographs: None     Lab Results  Component Value Date   HGBA1C 5.9 (A) 04/28/2023   ADA Risk Categorization: High Risk  Patient has one or more of the following: Loss of protective sensation Absent pedal pulses Severe Foot deformity History of foot ulcer  Assessment: 1. Pain due to onychomycosis of toenails of both feet   2. Bilateral lower extremity edema   3. Pes planus of both feet   4. Type II diabetes mellitus with peripheral circulatory disorder (HCC)   5. Encounter for diabetic foot exam (HCC)     Plan: Diabetic foot examination performed today. All patient's and/or POA's questions/concerns addressed on today's visit. Toenails 1-5 debrided in length and girth without incident. Continue foot and shoe inspections daily. We discussed his pedal circulation. He is asymptomatic on today with no rest pain, no open/non-healing wounds. Will order ABIs on next visit in June for baseline studies. He will call if he has any change in his feet. Monitor blood glucose per PCP/Endocrinologist's recommendations. Continue soft, supportive shoe gear daily. Report any pedal injuries to medical professional. Call office if there are any questions/concerns. -Patient/POA to call should there be question/concern in the interim. Return in about 9 weeks (around 09/18/2023).  Freddie Breech, DPM      [facility] LOCATION: [address], [place] [address]                   Office [phone]   [facility] LOCATION: [address] [place], [place] [address] Office [phone]

## 2023-07-21 MED ORDER — GEMTESA 75 MG PO TABS: 75.0000 mg | ORAL_TABLET | Freq: Every day | ORAL | 6 refills | Status: DC

## 2023-07-21 NOTE — Addendum Note (Signed)
 Addended by: Consuella Lose on: 07/21/2023 08:59 AM   Modules accepted: Orders

## 2023-07-24 ENCOUNTER — Encounter: Payer: Self-pay | Admitting: Podiatry

## 2023-07-24 ENCOUNTER — Encounter: Payer: Self-pay | Admitting: Dermatology

## 2023-07-24 ENCOUNTER — Ambulatory Visit: Admitting: Dermatology

## 2023-07-24 DIAGNOSIS — T1490XD Injury, unspecified, subsequent encounter: Secondary | ICD-10-CM

## 2023-07-24 DIAGNOSIS — C4491 Basal cell carcinoma of skin, unspecified: Secondary | ICD-10-CM

## 2023-07-24 DIAGNOSIS — Z48817 Encounter for surgical aftercare following surgery on the skin and subcutaneous tissue: Secondary | ICD-10-CM

## 2023-07-24 DIAGNOSIS — Z85828 Personal history of other malignant neoplasm of skin: Secondary | ICD-10-CM

## 2023-07-24 NOTE — Progress Notes (Signed)
   Follow Up Visit   Subjective  Cory [name] is a 83 y.o. male who presents for the following: follow up from Mohs surgery   The patient presents for follow up from Mohs surgery for a BCC on the central right forehead, treated on 07/16/23, repaired with advancement flap. The patient has been bandaging the wound as directed. The endorse the following concerns: none  The following portions of the chart were reviewed this encounter and updated as appropriate: medications, allergies, medical history  Review of Systems:  No other skin or systemic complaints except as noted in HPI or Assessment and Plan.  Objective  Well appearing patient in no apparent distress; mood and affect are within normal limits.  A full examination was performed including scalp, head, face. All findings within normal limits unless otherwise noted below.  Healing wound with mild erythema  Relevant physical exam findings are noted in the Assessment and Plan.    Assessment & Plan    Healing s/p Mohs for [facility], treated on 07/16/23, repaired with advancement flap. - Reassured that wound is healing well - No evidence of infection - No swelling, induration, purulence, dehiscence, or tenderness out of proportion to the clinical exam, see photo above - Discussed that scars take up to 12 months to mature from the date of surgery - Recommend SPF 30+ to scar daily to prevent purple color from UV exposure during scar maturation process - Discussed that erythema and raised appearance of scar will fade over the next 4-6 months - OK to start scar massage at 4-6 weeks post-op - Can consider silicone based products for scar healing starting at 6 weeks post-op - Ok to continue ointment daily to wound under a bandage for another week  HISTORY OF BASAL CELL CARCINOMA OF THE SKIN - No evidence of recurrence today - Recommend regular full body skin exams - Recommend daily broad spectrum sunscreen SPF 30+ to sun-exposed areas,  reapply every 2 hours as needed.  - Call if any new or changing lesions are noted between office visits    Return in about 3 weeks (around 08/14/2023) for wound check.  I, Tillie Fantasia, CMA, am acting as scribe for Gwenith Daily, MD.   Documentation: I have reviewed the above documentation for accuracy and completeness, and I agree with the above.  Gwenith Daily, MD

## 2023-07-24 NOTE — Patient Instructions (Signed)

## 2023-07-26 ENCOUNTER — Encounter: Payer: Self-pay | Admitting: Nurse Practitioner

## 2023-07-30 NOTE — Progress Notes (Deleted)
 Cardiology Clinic Note   Date: 07/30/2023 ID: MATHAYUS Cory [name], DOB 1941/04/02, MRN [id number]  Primary Cardiologist:  Lorine Bears, MD  Chief Complaint   Cory [name] is a 83 y.o. male who presents to the clinic today for ***  Patient Profile   Cory [name] is followed by *** for the history outlined below.      Past medical history significant for: CAD. CABG x 7 2000 performed in [place], [place]. LHC 04/07/2018 (angina) performed at [facility]: Occlusion very proximal RCA and LCx.  Severe narrowing of LAD.  Patent LIMA to LAD, sequential SVG to diagonal and the distal LAD, SVG to ramus, SVG to OM and SVG to PDA  New severe narrowing of distal portion of graft to upper OM.  PCI with DES 2.75 x 12 mm to SVG to upper OM.  Anatomy otherwise unchanged from 2016. Thoracic aortic aneurysm. CTA chest abdomen pelvis 04/12/2023: Ascending thoracic aortic aneurysm 4.3 cm (previously 4 cm).  Recommend annual surveillance. PAD. ABI/lower extremity arterial ultrasound 11/17/2020: Right near normal examination.  Left 75 to 99% stenosis noted in the popliteal artery.  Scant flow in proximal SFA.  Subtotal occlusion cannot be ruled out.  Right ABI without evidence of significant arterial disease.  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 Carotid artery stenosis. Carotid duplex 04/29/2023: Right ICA 40 to 59%, right ECA > 50%.  Left ICA 1 to 39%, left ECA <50%. Chronic diastolic heart failure. Echo 10/21/2021: EF 55 to 60%.  No RWMA.  Mild concentric LVH.  Grade 1 DD.  Normal RV size/function.  Mildly elevated PA pressure, RVSP 41.4 mmHg.  Moderate LAE.  Mild MR.  Aortic valve sclerosis/calcification without stenosis.  Mild dilatation of ascending aorta 39 mm. Hypertension. Hyperlipidemia. CVA. Right hemiparesis. T2DM GERD.  In summary, patient with a history of CAD s/p CABG x 7 in 2000 performed in [place], [place].  Heart catheterization in December  2019 showed new severe narrowing of distal portion of graft to upper OM and patient underwent PCI with DES to SVG to OM (further details above).  History of CVA in 2015 and 2 subsequent TIAs since that time.  Patient was initially followed by Dr. Swaziland in [place].  He transferred his care to Dr. Kirke Corin in August 2022 to be seen closer to home.  Patient underwent LPa ultrasound July 2022 which showed moderate left lower extremity arterial disease which Dr. Kirke Corin to treated with aggressive medical management.  Carotid ultrasound November 2022 showed right ICA 40 to 59% and left ICA 1 to 39%.  Follow-up in November 2022 patient reported improvement in claudication symptoms and medical therapy was continued.  Patient was evaluated in the ED on 04/12/2023 for chest pain. His workup was unremarkable. Troponin negative x 2. CTA negative for PE. EKG was reviewed by cardiology and unchanged from prior.   Patient was last seen in the office by me on 05/01/2023 for routine follow-up.  He had reported increased epigastric discomfort improved with Protonix prescribed by PCP.  He also reported left-sided chest discomfort that prompted an ED visit as described above.  He reported occasional dizziness when putting on socks or shoes and described dropping his head toward the floor to accomplish this task.  It was suggested he stand and prop his foot up so he was not bending over with his head facing down to perform these activities.  A nuclear stress test was ordered to further evaluate his chest pain.  He contacted the office after his visit to report he did not feel the stress test was warranted as he was asymptomatic.  Patient underwent short hospital admission 05/12/2023 to 05/13/2023 for weakness, near syncope, nausea, vomiting, diarrhea.  He was treated with antiemetics and fluid resuscitation.  CT scan was negative for acute abnormalities and consistent with inflammatory changes of colitis.  He was discharged with a  5-day course of Bactrim.     History of Present Illness    Today, patient ***  CAD/atypical precordial chest pain S/p CABG x 7 2000 performed in [place], [place].  PCI with DES to SVG to upper OM December 2019.  Patient*** -Continue aspirin, Plavix, amlodipine, atorvastatin, carvedilol, as needed SL NTG.   Carotid artery stenosis Carotid duplex 04/29/2023 showed right ICA 40 to 59%, left ICA 1 to 39% unchanged from prior imaging.  Patient reports mild dizziness with bending over to put on socks or remove shoes. It is suggested he try propping his foot up on a chair to do these activities so as not to drop his head down. He agrees to try.*** -Continue aspirin, Plavix, atorvastatin.   Thoracic aortic aneurysm CT abdomen pelvis 04/12/2023 showed ascending thoracic aortic aneurysm 4.3 cm.  Patient denies chest pain, abdominal pain, back pain, presyncope, syncope.*** -Continue yearly surveillance.   Hypertension BP today***. -Continue amlodipine, carvedilol, lisinopril.   Chronic diastolic heart failure Echo June 2023 showed EF 55 to 60%, mild LVH, Grade I DD, mildly elevated PA pressure, moderate LAE, mild MR.  Patient reports chronic lower extremity edema unchanged from previous.***Euvolemic and well compensated on exam. -Continue carvedilol, lisinopril.   Fall ***  ROS: All other systems reviewed and are otherwise negative except as noted in History of Present Illness.  EKGs/Labs Reviewed        05/12/2023: ALT 27; AST 38 07/07/2023: BUN 13; Creatinine, Ser 1.21; Potassium 4.8; Sodium 135   07/11/2023: Hemoglobin 11.8; WBC 6.1   12/27/2022: TSH 1.72   01/09/2023: Pro B Natriuretic peptide (BNP) 184.0  ***  Risk Assessment/Calculations    {Does this patient have ATRIAL FIBRILLATION?:[id number]} No BP recorded.  {Refresh Note OR Click here to enter BP  :1}***        Physical Exam    VS:  There were no vitals taken for this visit. , BMI There is no height or weight  on file to calculate BMI.  GEN: Well nourished, well developed, in no acute distress. Neck: No JVD or carotid bruits. Cardiac: *** RRR. No murmurs. No rubs or gallops.   Respiratory:  Respirations regular and unlabored. Clear to auscultation without rales, wheezing or rhonchi. GI: Soft, nontender, nondistended. Extremities: Radials/DP/PT 2+ and equal bilaterally. No clubbing or cyanosis. No edema ***  Skin: Warm and dry, no rash. Neuro: Strength intact.  Assessment & Plan   ***  Disposition: ***     {Are you ordering a CV Procedure (e.g. stress test, cath, DCCV, TEE, etc)?   Press F2        :[id number]}   Signed, Etta Grandchild. Addelyn Alleman, DNP, NP-C

## 2023-08-04 ENCOUNTER — Ambulatory Visit: Payer: Medicare Other | Admitting: Student

## 2023-08-07 ENCOUNTER — Encounter: Payer: Self-pay | Admitting: Nurse Practitioner

## 2023-08-07 DIAGNOSIS — I6930 Unspecified sequelae of cerebral infarction: Secondary | ICD-10-CM

## 2023-08-07 MED ORDER — ATORVASTATIN CALCIUM 40 MG PO TABS: 40.0000 mg | ORAL_TABLET | Freq: Every day | ORAL | 3 refills | Status: DC

## 2023-08-10 NOTE — Progress Notes (Unsigned)
 Cardiology Clinic Note   Date: 08/14/2023 ID: Cory [name], DOB 22-May-1940, MRN [id number]  Primary Cardiologist:  Antionette Kirks, MD  Chief Complaint   Cory [name] is a 83 y.o. male who presents to the clinic today for routine follow up.   Patient Profile   Cory [name] is followed by Dr. Alvenia Aus for the history outlined below.      Past medical history significant for: CAD. CABG x 7 2000 performed in [place], [place]. LHC 04/07/2018 (angina) performed at [facility]: Occlusion very proximal RCA and LCx.  Severe narrowing of LAD.  Patent LIMA to LAD, sequential SVG to diagonal and the distal LAD, SVG to ramus, SVG to OM and SVG to PDA  New severe narrowing of distal portion of graft to upper OM.  PCI with DES 2.75 x 12 mm to SVG to upper OM.  Anatomy otherwise unchanged from 2016. Thoracic aortic aneurysm. CTA chest abdomen pelvis 04/12/2023: Ascending thoracic aortic aneurysm 4.3 cm (previously 4 cm).  Recommend annual surveillance. PAD. ABI/lower extremity arterial ultrasound 11/17/2020: Right near normal examination.  Left 75 to 99% stenosis noted in the popliteal artery.  Scant flow in proximal SFA.  Subtotal occlusion cannot be ruled out.  Right ABI without evidence of significant arterial disease.  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 Carotid artery stenosis. Carotid duplex 04/29/2023: Right ICA 40 to 59%, right ECA > 50%.  Left ICA 1 to 39%, left ECA <50%. Chronic diastolic heart failure. Echo 10/21/2021: EF 55 to 60%.  No RWMA.  Mild concentric LVH.  Grade 1 DD.  Normal RV size/function.  Mildly elevated PA pressure, RVSP 41.4 mmHg.  Moderate LAE.  Mild MR.  Aortic valve sclerosis/calcification without stenosis.  Mild dilatation of ascending aorta 39 mm. Hypertension. Hyperlipidemia. CVA. Right hemiparesis. T2DM GERD.  In summary, patient with a history of CAD s/p CABG x 7 in 2000 performed in [place], [place].  Heart  catheterization in December 2019 showed new severe narrowing of distal portion of graft to upper OM and patient underwent PCI with DES to SVG to OM (further details above).  History of CVA in 2015 and 2 subsequent TIAs since that time.  Patient was initially followed by Dr. Swaziland in [place].  He transferred his care to Dr. Alvenia Aus in August 2022 to be seen closer to home.  Patient underwent LPa ultrasound July 2022 which showed moderate left lower extremity arterial disease which Dr. Alvenia Aus to treated with aggressive medical management.  Carotid ultrasound November 2022 showed right ICA 40 to 59% and left ICA 1 to 39%.  Follow-up in November 2022 patient reported improvement in claudication symptoms and medical therapy was continued.  Patient was evaluated in the ED on 04/12/2023 for chest pain. His workup was unremarkable. Troponin negative x 2. CTA negative for PE. EKG was reviewed by cardiology and unchanged from prior.   Patient was last seen in the office by me on 05/01/2023 for routine follow-up.  He had reported increased epigastric discomfort improved with Protonix prescribed by PCP.  He also reported left-sided chest discomfort that prompted an ED visit as described above.  He reported occasional dizziness when putting on socks or shoes and described dropping his head toward the floor to accomplish this task.  It was suggested he stand and prop his foot up so he was not bending over with his head facing down to perform these activities.  A nuclear stress test was ordered to further  evaluate his chest pain.  He contacted the office after his visit to report he did not feel the stress test was warranted as he was asymptomatic.  Patient underwent short hospital admission 05/12/2023 to 05/13/2023 for weakness, near syncope, nausea, vomiting, diarrhea.  He was treated with antiemetics and fluid resuscitation.  CT scan was negative for acute abnormalities and consistent with inflammatory changes of colitis.   He was discharged with a 5-day course of Bactrim.     History of Present Illness    Today, patient reports he is overall doing well. He reports occasional, very brief left sided chest pain typically occurring at rest and resolving within seconds on its own. His activity is limited secondary to bilateral hip and quad pain. He takes his dog to the dog park for exercise. He denies shortness of breath, orthopnea or PND. He reports his PCP changed him from lisinopril to losartan secondary to "head stuffiness." Upon further review of the chart, patient's PCP made the change secondary to chronic cough. Losartan was then increased for elevated BP. He never increased it because he was not sure why it was being increased. He reports continued cough. He has not been checking BP at home secondary to feeling as though his cuff is too big. He has an upcoming visit with PCP next week.      ROS: All other systems reviewed and are otherwise negative except as noted in History of Present Illness.  EKGs/Labs Reviewed        05/12/2023: ALT 27; AST 38 07/07/2023: BUN 13; Creatinine, Ser 1.21; Potassium 4.8; Sodium 135   07/11/2023: Hemoglobin 11.8; WBC 6.1   12/27/2022: TSH 1.72   01/09/2023: Pro B Natriuretic peptide (BNP) 184.0    Physical Exam    VS:  BP 138/78 (BP Location: Left Arm, Patient Position: Sitting, Cuff Size: Normal)   Pulse (!) 58   Ht 5\' 5"  (1.651 m)   Wt 168 lb (76.2 kg)   SpO2 97%   BMI 27.96 kg/m  , BMI Body mass index is 27.96 kg/m.  GEN: Well nourished, well developed, in no acute distress. Neck: No JVD or carotid bruits. Cardiac:  RRR. No murmurs. No rubs or gallops.   Respiratory:  Respirations regular and unlabored. Clear to auscultation without rales, wheezing or rhonchi. GI: Soft, nontender, nondistended. Extremities: Radials/DP/PT 2+ and equal bilaterally. No clubbing or cyanosis. Mild, nonpitting edema bilateral lower extremities.   Skin: Warm and dry, no rash. Neuro:  Strength intact.  Assessment & Plan   CAD/atypical precordial chest pain S/p CABG x 7 2000 performed in [place], [place] .  PCI with DES to SVG to OM December 2019.  Patient reports occasional, very brief left sided chest pain typically at rest that resolves in seconds on its own. His activity is limited secondary to bilateral hip pain. He does take his dog to the dog park. He declines any further workup of chest pain episodes at this time.  -Increase physical activity as tolerated.  -Continue aspirin, Plavix, amlodipine, atorvastatin, carvedilol, as needed SL NTG.   Carotid artery stenosis Carotid duplex 04/29/2023 showed right ICA 40 to 59%, left ICA 1 to 39% unchanged from prior imaging. No lightheadedness or dizziness reported.  -Continue aspirin, Plavix, atorvastatin.   Thoracic aortic aneurysm CT abdomen pelvis 04/12/2023 showed ascending thoracic aortic aneurysm 4.3 cm.  Patient denies chest pain, abdominal pain, back pain, presyncope, syncope.  -Continue yearly surveillance.   Hypertension BP today 140/72 on intake and 138/78 on my recheck.  Patient reports being changed from lisinopril to losartan by PCP for cough. He was supposed to increase losartan to 50 mg but he was unsure why he had to do that so he kept taking 25 mg. He reports cough has continued.  -Continue amlodipine. -Hold losartan. -Increase carvedilol to 12.5 mg bid.  -Monitor BP. Log provided.  -Keep follow up with PCP in 1 week.    Chronic diastolic heart failure Echo June 2023 showed EF 55 to 60%, mild LVH, Grade I DD, mildly elevated PA pressure, moderate LAE, mild MR.  Patient reports chronic lower extremity edema unchanged from previous. Mild, nonpitting edema bilateral lower extremities.  Euvolemic and well compensated on exam. -Continue carvedilol. Losartan being held as above.   Disposition: Hold losartan. Increase carvedilol to 12.5 mg bid. Return in 3 months or sooner as needed.           Signed, Lonell Rives. Lashala Laser, DNP, NP-C

## 2023-08-12 MED ORDER — FEXOFENADINE HCL 180 MG PO TABS: 180.0000 mg | ORAL_TABLET | Freq: Every day | ORAL | 1 refills | Status: AC

## 2023-08-12 NOTE — Addendum Note (Signed)
 Addended by: Dorothe Gaster on: 08/12/2023 07:17 PM   Modules accepted: Orders

## 2023-08-13 ENCOUNTER — Ambulatory Visit (INDEPENDENT_AMBULATORY_CARE_PROVIDER_SITE_OTHER): Admitting: Dermatology

## 2023-08-13 ENCOUNTER — Encounter: Payer: Self-pay | Admitting: Dermatology

## 2023-08-13 DIAGNOSIS — L858 Other specified epidermal thickening: Secondary | ICD-10-CM | POA: Diagnosis not present

## 2023-08-13 DIAGNOSIS — C4491 Basal cell carcinoma of skin, unspecified: Secondary | ICD-10-CM

## 2023-08-13 DIAGNOSIS — T1490XD Injury, unspecified, subsequent encounter: Secondary | ICD-10-CM

## 2023-08-13 NOTE — Progress Notes (Unsigned)
   Follow Up Visit   Subjective  Cory [name] is a 83 y.o. male who presents for the following: follow up from Mohs surgery   The patient presents for follow up from Mohs surgery for a BCC on the central right forehead, treated on 07/16/23, repaired with advancement flap. The patient has been bandaging the wound as directed. The endorse the following concerns: none  The following portions of the chart were reviewed this encounter and updated as appropriate: medications, allergies, medical history  Review of Systems:  No other skin or systemic complaints except as noted in HPI or Assessment and Plan.  Objective  Well appearing patient in no apparent distress; mood and affect are within normal limits.  A full examination was performed including scalp, head, face. All findings within normal limits unless otherwise noted below.  Healing wound with mild erythema  Relevant physical exam findings are noted in the Assessment and Plan.    Assessment & Plan   Healing s/p Mohs for [facility], treated on 07/16/23, repaired with advancement flap - Reassured that wound is healing well - No evidence of infection - No swelling, induration, purulence, dehiscence, or tenderness out of proportion to the clinical exam, see photo above - Discussed that scars take up to 12 months to mature from the date of surgery - Recommend SPF 30+ to scar daily to prevent purple color from UV exposure during scar maturation process - Discussed that erythema and raised appearance of scar will fade over the next 4-6 months - OK to start scar massage at 4-6 weeks post-op - Can consider silicone based products for scar healing starting at 6 weeks post-op - Ok to continue ointment daily to wound under a bandage for another week  HISTORY OF BASAL CELL CARCINOMA OF THE SKIN - No evidence of recurrence today - Recommend regular full body skin exams - Recommend daily broad spectrum sunscreen SPF 30+ to sun-exposed areas,  reapply every 2 hours as needed.  - Call if any new or changing lesions are noted between office visits  Cutaneous horn, lower vermilion border- Ddx SCC vs AK vs wart vs other - Contact Dr. Shirlie Dove office to get him for biopsy   Return in about 2 weeks (around 08/27/2023) for wound check.  I, Wilson Hasten, CMA, am acting as scribe for Deneise Finlay, MD.   Documentation: I have reviewed the above documentation for accuracy and completeness, and I agree with the above.  Deneise Finlay, MD

## 2023-08-13 NOTE — Patient Instructions (Addendum)

## 2023-08-14 ENCOUNTER — Ambulatory Visit: Attending: Student | Admitting: Student

## 2023-08-14 ENCOUNTER — Encounter: Payer: Self-pay | Admitting: Student

## 2023-08-14 VITALS — BP 138/78 | HR 58 | Ht 65.0 in | Wt 168.0 lb

## 2023-08-14 DIAGNOSIS — I6523 Occlusion and stenosis of bilateral carotid arteries: Secondary | ICD-10-CM | POA: Diagnosis present

## 2023-08-14 DIAGNOSIS — I10 Essential (primary) hypertension: Secondary | ICD-10-CM | POA: Diagnosis present

## 2023-08-14 DIAGNOSIS — R0789 Other chest pain: Secondary | ICD-10-CM | POA: Diagnosis present

## 2023-08-14 DIAGNOSIS — I5032 Chronic diastolic (congestive) heart failure: Secondary | ICD-10-CM | POA: Diagnosis present

## 2023-08-14 DIAGNOSIS — I7121 Aneurysm of the ascending aorta, without rupture: Secondary | ICD-10-CM | POA: Diagnosis present

## 2023-08-14 DIAGNOSIS — I2510 Atherosclerotic heart disease of native coronary artery without angina pectoris: Secondary | ICD-10-CM

## 2023-08-14 MED ORDER — CARVEDILOL 12.5 MG PO TABS: 12.5000 mg | ORAL_TABLET | Freq: Two times a day (BID) | ORAL | 3 refills | Status: DC

## 2023-08-14 NOTE — Patient Instructions (Signed)
 Medication Instructions:  Your physician has recommended you make the following change in your medication:   -Stop losartan (cozaar).  -Increase carvedilol (coreg) to 12.5mg  twice daily.  *If you need a refill on your cardiac medications before your next appointment, please call your pharmacy*   Follow-Up: At [facility], you and your health needs are our priority.  As part of our continuing mission to provide you with exceptional heart care, our providers are all part of one team.  This team includes your primary Cardiologist (physician) and Advanced Practice Providers or APPs (Physician Assistants and Nurse Practitioners) who all work together to provide you with the care you need, when you need it.  Your next appointment:   3 month(s)  Provider:   Morey Ar, NP    We recommend signing up for the patient portal called "MyChart".  Sign up information is provided on this After Visit Summary.  MyChart is used to connect with patients for Virtual Visits (Telemedicine).  Patients are able to view lab/test results, encounter notes, upcoming appointments, etc.  Non-urgent messages can be sent to your provider as well.   To learn more about what you can do with MyChart, go to [email or web address].   Other Instructions Your physician has requested that you regularly monitor and record your blood pressure readings at home. Please use the same machine at the same time of day to check your readings and record them to bring to your follow-up visit.

## 2023-08-19 ENCOUNTER — Encounter: Payer: Self-pay | Admitting: Dermatology

## 2023-08-19 ENCOUNTER — Ambulatory Visit (INDEPENDENT_AMBULATORY_CARE_PROVIDER_SITE_OTHER): Admitting: Dermatology

## 2023-08-19 DIAGNOSIS — D485 Neoplasm of uncertain behavior of skin: Secondary | ICD-10-CM

## 2023-08-19 DIAGNOSIS — L570 Actinic keratosis: Secondary | ICD-10-CM

## 2023-08-19 DIAGNOSIS — D492 Neoplasm of unspecified behavior of bone, soft tissue, and skin: Secondary | ICD-10-CM

## 2023-08-19 HISTORY — DX: Actinic keratosis: L57.0

## 2023-08-19 NOTE — Progress Notes (Signed)
   Follow-Up Visit   Subjective  Cory [name] is a 83 y.o. male who presents for the following: Lesion on the R lower lip, sore, tender to the touch, growing larger.  The patient has spots, moles and lesions to be evaluated, some may be new or changing and the patient may have concern these could be cancer.   The following portions of the chart were reviewed this encounter and updated as appropriate: medications, allergies, medical history  Review of Systems:  No other skin or systemic complaints except as noted in HPI or Assessment and Plan.  Objective  Well appearing patient in no apparent distress; mood and affect are within normal limits.   A focused examination was performed of the following areas: the face   Relevant exam findings are noted in the Assessment and Plan.  R lower lip 0.6 cm pink papule with cutaneous horn   Assessment & Plan     NEOPLASM OF UNCERTAIN BEHAVIOR OF SKIN R lower lip Skin / nail biopsy Type of biopsy: tangential   Informed consent: discussed and consent obtained   Timeout: patient name, date of birth, surgical site, and procedure verified   Procedure prep:  Patient was prepped and draped in usual sterile fashion Prep type:  Isopropyl alcohol Anesthesia: the lesion was anesthetized in a standard fashion   Anesthetic:  1% lidocaine  w/ epinephrine  1-100,000 buffered w/ 8.4% NaHCO3 Instrument used: flexible razor blade   Hemostasis achieved with: pressure, aluminum chloride and electrodesiccation   Outcome: patient tolerated procedure well   Post-procedure details: sterile dressing applied and wound care instructions given   Dressing type: bandage (Mupirocin 2% ointment)   Specimen 1 - Surgical pathology Differential Diagnosis: D48.5 r/o SCC vs BCC Check Margins: No  Return for appointment as scheduled.  Arlinda Lais, CMA, am acting as scribe for Harris Liming, MD .   Documentation: I have reviewed the above documentation  for accuracy and completeness, and I agree with the above.  Harris Liming, MD

## 2023-08-19 NOTE — Patient Instructions (Addendum)

## 2023-08-20 ENCOUNTER — Ambulatory Visit (INDEPENDENT_AMBULATORY_CARE_PROVIDER_SITE_OTHER)
Admission: RE | Admit: 2023-08-20 | Discharge: 2023-08-20 | Disposition: A | Discharge status: Home / Self Care | Source: Ambulatory Visit | Attending: Nurse Practitioner

## 2023-08-20 ENCOUNTER — Ambulatory Visit (INDEPENDENT_AMBULATORY_CARE_PROVIDER_SITE_OTHER): Admitting: Nurse Practitioner

## 2023-08-20 VITALS — BP 140/70 | HR 55 | Temp 97.5°F | Ht 65.0 in | Wt 169.0 lb

## 2023-08-20 DIAGNOSIS — R5383 Other fatigue: Secondary | ICD-10-CM | POA: Diagnosis not present

## 2023-08-20 DIAGNOSIS — R001 Bradycardia, unspecified: Secondary | ICD-10-CM

## 2023-08-20 DIAGNOSIS — I10 Essential (primary) hypertension: Secondary | ICD-10-CM | POA: Diagnosis not present

## 2023-08-20 DIAGNOSIS — R053 Chronic cough: Secondary | ICD-10-CM | POA: Diagnosis not present

## 2023-08-20 NOTE — Progress Notes (Signed)
 Established Patient Office Visit  Subjective   Patient ID: Cory [name], male    DOB: 11/14/1940  Age: 83 y.o. MRN: [id number]  Chief Complaint  Patient presents with   Hypertension    Pt BP readings this afternoon at home were 144/77. Pt is still having trouble sleeping. States he is very fatigued.       HTN: patient was seen by me and had uncontrolled blood pressure. He is currently on losartan  50mg , amlodipine  5mg  and carvedilol  12.5mg  BID. States that cardiology did stop the losartan . He did get a new blood pressure machinge. 144/77 today.   States that he was switched from linsinopril to losartan  because of a cough. He is having the cough daily several times a day. Dry cough. No coorleation with eating or drinking.      Review of Systems  Constitutional:  Negative for chills and fever.  Respiratory:  Negative for shortness of breath.   Cardiovascular:  Negative for chest pain.  Gastrointestinal:  Positive for constipation. Negative for abdominal pain, diarrhea, nausea and vomiting.       Bm some daily   Genitourinary:  Positive for urgency.  Neurological:  Positive for dizziness and speech change. Negative for headaches.  Psychiatric/Behavioral:  Negative for hallucinations and suicidal ideas.       Objective:     BP (!) 140/70   Pulse (!) 55   Temp (!) 97.5 F (36.4 C) (Oral)   Ht 5\' 5"  (1.651 m)   Wt 169 lb (76.7 kg)   SpO2 97%   BMI 28.12 kg/m  BP Readings from Last 3 Encounters:  08/20/23 (!) 140/70  08/14/23 138/78  07/16/23 (!) 181/61   Wt Readings from Last 3 Encounters:  08/20/23 169 lb (76.7 kg)  08/14/23 168 lb (76.2 kg)  07/11/23 165 lb 3.2 oz (74.9 kg)   SpO2 Readings from Last 3 Encounters:  08/20/23 97%  08/14/23 97%  07/11/23 96%      Physical Exam Vitals and nursing note reviewed.  Constitutional:      Appearance: Normal appearance.  Cardiovascular:     Rate and Rhythm: Regular rhythm. Bradycardia present.     Heart  sounds: Normal heart sounds.  Pulmonary:     Effort: Pulmonary effort is normal.     Breath sounds: Normal breath sounds.  Neurological:     Mental Status: He is alert. Mental status is at baseline.     Deep Tendon Reflexes:     Reflex Scores:      Bicep reflexes are 2+ on the right side and 2+ on the left side.      Patellar reflexes are 1+ on the right side and 2+ on the left side.    Comments: Bilateral upper and lower extremity strength 5/5      No results found for any visits on 08/20/23.    The ASCVD Risk score (Arnett DK, et al., 2019) failed to calculate for the following reasons:   The 2019 ASCVD risk score is only valid for ages 39 to 23   Risk score cannot be calculated because patient has a medical history suggesting prior/existing ASCVD    Assessment & Plan:   Problem List Items Addressed This Visit       Cardiovascular and Mediastinum   Hypertension   Patient currently maintained on amlodipine  5 mg daily and carvedilol  12.5 mg twice daily.  Cardiology discontinued losartan .  Patient's blood pressure slightly above goal but borderline.  Given patient's  age and fragility I will not increase or change any medications at this juncture.  Patient is having some fatigue query if this is secondary to increase in beta-blocker use.  Patient's pulse is lower than what it has been in the past.  States he is being about the same reading at home he will see what his readings are over the next couple of days and see if we need to decrease the amount of beta-blocker given fatigue and dizziness.        Other   Fatigue   Multi factorial.  Query increase in beta-blocker exacerbating patient's fatigue      Chronic cough - Primary   History of the same.  We have tried GERD medication along with allergy medication.  Patient has tried Flonase  for PND we also switched his ACE to an ARB which helped but did not resolve the cough.  Pending chest x-ray today if negative consider referral  to pulmonology for further evaluation for chronic cough.      Relevant Orders   DG Chest 2 View   Bradycardia   Secondary to increase in beta-blocker.  Patient is hanging around similar pulse rate that he had in the past.  Will continue to monitor he will check at home.  Consider decreasing beta-blocker       Return in about 3 months (around 11/19/2023) for BP recheck.    Margarie Shay, NP

## 2023-08-20 NOTE — Assessment & Plan Note (Signed)
 Patient currently maintained on amlodipine  5 mg daily and carvedilol  12.5 mg twice daily.  Cardiology discontinued losartan .  Patient's blood pressure slightly above goal but borderline.  Given patient's age and fragility I will not increase or change any medications at this juncture.  Patient is having some fatigue query if this is secondary to increase in beta-blocker use.  Patient's pulse is lower than what it has been in the past.  States he is being about the same reading at home he will see what his readings are over the next couple of days and see if we need to decrease the amount of beta-blocker given fatigue and dizziness.

## 2023-08-20 NOTE — Assessment & Plan Note (Signed)
 History of the same.  We have tried GERD medication along with allergy medication.  Patient has tried Flonase  for PND we also switched his ACE to an ARB which helped but did not resolve the cough.  Pending chest x-ray today if negative consider referral to pulmonology for further evaluation for chronic cough.

## 2023-08-20 NOTE — Assessment & Plan Note (Signed)
 Multi factorial.  Query increase in beta-blocker exacerbating patient's fatigue

## 2023-08-20 NOTE — Assessment & Plan Note (Signed)
 Secondary to increase in beta-blocker.  Patient is hanging around similar pulse rate that he had in the past.  Will continue to monitor he will check at home.  Consider decreasing beta-blocker

## 2023-08-20 NOTE — Patient Instructions (Signed)
 Nice to see you today Check your blood pressure and pulse every day If you pulse stays 55 or lower let me know

## 2023-08-21 ENCOUNTER — Encounter: Payer: Self-pay | Admitting: Nurse Practitioner

## 2023-08-21 ENCOUNTER — Telehealth: Payer: Self-pay | Admitting: Nurse Practitioner

## 2023-08-21 DIAGNOSIS — R053 Chronic cough: Secondary | ICD-10-CM

## 2023-08-21 NOTE — Telephone Encounter (Signed)
 Referral placed in location requested

## 2023-08-21 NOTE — Telephone Encounter (Signed)
-----   Message from [facility] T sent at 08/21/2023 11:14 AM EDT ----- Called patient reviewed all information and repeated back to me.  Pt prefers [place] location for lung specialist.

## 2023-08-27 ENCOUNTER — Ambulatory Visit: Admitting: Dermatology

## 2023-08-27 LAB — SURGICAL PATHOLOGY

## 2023-08-28 ENCOUNTER — Telehealth: Payer: Self-pay

## 2023-08-28 NOTE — Telephone Encounter (Signed)
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 It may resolve without treatment. Recommend follow up in 1 month to recheck or sooner if it regrows.

## 2023-08-28 NOTE — Telephone Encounter (Signed)
 Advised pt of pathology results.  Scheduled pt for 1 month f/u./sh

## 2023-09-01 ENCOUNTER — Ambulatory Visit (INDEPENDENT_AMBULATORY_CARE_PROVIDER_SITE_OTHER): Admitting: Dermatology

## 2023-09-01 ENCOUNTER — Encounter: Payer: Self-pay | Admitting: Dermatology

## 2023-09-01 VITALS — BP 130/80 | HR 63

## 2023-09-01 DIAGNOSIS — T1490XD Injury, unspecified, subsequent encounter: Secondary | ICD-10-CM

## 2023-09-01 DIAGNOSIS — C4491 Basal cell carcinoma of skin, unspecified: Secondary | ICD-10-CM

## 2023-09-01 DIAGNOSIS — Z85828 Personal history of other malignant neoplasm of skin: Secondary | ICD-10-CM

## 2023-09-01 DIAGNOSIS — Z48817 Encounter for surgical aftercare following surgery on the skin and subcutaneous tissue: Secondary | ICD-10-CM

## 2023-09-01 DIAGNOSIS — L905 Scar conditions and fibrosis of skin: Secondary | ICD-10-CM

## 2023-09-01 NOTE — Progress Notes (Signed)
   Follow Up Visit   Subjective  Cory [name] is a 83 y.o. male who presents for the following: follow up from Mohs surgery   The patient presents for follow up from Mohs surgery for a BCC on the right forehead, treated on 07/16/23, repaired with advancement flap. The patient has been bandaging the wound as directed. The endorse the following concerns: No questions or concerns at this time.  The following portions of the chart were reviewed this encounter and updated as appropriate: medications, allergies, medical history  Review of Systems:  No other skin or systemic complaints except as noted in HPI or Assessment and Plan.  Objective  Well appearing patient in no apparent distress; mood and affect are within normal limits.  A full examination was performed including scalp, head, and face. All findings within normal limits unless otherwise noted below.  Healing wound with mild erythema  Relevant physical exam findings are noted in the Assessment and Plan.    Assessment & Plan   Healed s/p Mohs for [facility], treated on 07/16/23, repaired with advancement flap - Reassured that wound is healing well - Spitting suture removed - No evidence of infection - No swelling, induration, purulence, dehiscence, or tenderness out of proportion to the clinical exam, see photo above - Discussed that scars take up to 12 months to mature from the date of surgery - Recommend SPF 30+ to scar daily to prevent purple color from UV exposure during scar maturation process - Discussed that erythema and raised appearance of scar will fade over the next 4-6 months - OK to start scar massage at 4-6 weeks post-op - Can consider silicone based products for scar healing starting at 6 weeks post-op - Ok to discontinue ointment daily to wound.  HISTORY OF BASAL CELL CARCINOMA OF THE SKIN - No evidence of recurrence today - Recommend regular full body skin exams - Recommend daily broad spectrum sunscreen SPF 30+  to sun-exposed areas, reapply every 2 hours as needed.  - Call if any new or changing lesions are noted between office visits  Return if symptoms worsen or fail to improve.  I, Haig Levan, Surg Tech [name], am acting as scribe for Deneise Finlay, MD.   Documentation: I have reviewed the above documentation for accuracy and completeness, and I agree with the above.  Deneise Finlay, MD

## 2023-09-01 NOTE — Patient Instructions (Signed)

## 2023-09-11 ENCOUNTER — Other Ambulatory Visit: Payer: Self-pay | Admitting: Urology

## 2023-09-11 DIAGNOSIS — N138 Other obstructive and reflux uropathy: Secondary | ICD-10-CM

## 2023-09-18 ENCOUNTER — Other Ambulatory Visit: Payer: Self-pay | Admitting: *Deleted

## 2023-09-18 DIAGNOSIS — D509 Iron deficiency anemia, unspecified: Secondary | ICD-10-CM

## 2023-09-19 ENCOUNTER — Inpatient Hospital Stay: Payer: Medicare Other | Attending: Oncology

## 2023-09-19 DIAGNOSIS — I10 Essential (primary) hypertension: Secondary | ICD-10-CM | POA: Diagnosis not present

## 2023-09-19 DIAGNOSIS — Z87891 Personal history of nicotine dependence: Secondary | ICD-10-CM | POA: Diagnosis not present

## 2023-09-19 DIAGNOSIS — Z79899 Other long term (current) drug therapy: Secondary | ICD-10-CM | POA: Diagnosis not present

## 2023-09-19 DIAGNOSIS — D509 Iron deficiency anemia, unspecified: Secondary | ICD-10-CM | POA: Insufficient documentation

## 2023-09-19 LAB — CBC WITH DIFFERENTIAL/PLATELET
Abs Immature Granulocytes: 0.01 10*3/uL (ref 0.00–0.07)
Basophils Absolute: 0.1 10*3/uL (ref 0.0–0.1)
Basophils Relative: 1 %
Eosinophils Absolute: 0.3 10*3/uL (ref 0.0–0.5)
Eosinophils Relative: 6 %
HCT: 37.4 % — ABNORMAL LOW (ref 39.0–52.0)
Hemoglobin: 12 g/dL — ABNORMAL LOW (ref 13.0–17.0)
Immature Granulocytes: 0 %
Lymphocytes Relative: 24 %
Lymphs Abs: 1.2 10*3/uL (ref 0.7–4.0)
MCH: 30.2 pg (ref 26.0–34.0)
MCHC: 32.1 g/dL (ref 30.0–36.0)
MCV: 94 fL (ref 80.0–100.0)
Monocytes Absolute: 0.6 10*3/uL (ref 0.1–1.0)
Monocytes Relative: 12 %
Neutro Abs: 2.9 10*3/uL (ref 1.7–7.7)
Neutrophils Relative %: 57 %
Platelets: 166 10*3/uL (ref 150–400)
RBC: 3.98 MIL/uL — ABNORMAL LOW (ref 4.22–5.81)
RDW: 12.4 % (ref 11.5–15.5)
WBC: 5.1 10*3/uL (ref 4.0–10.5)
nRBC: 0 % (ref 0.0–0.2)

## 2023-09-19 LAB — IRON AND TIBC
Iron: 56 ug/dL (ref 45–182)
Saturation Ratios: 15 % — ABNORMAL LOW (ref 17.9–39.5)
TIBC: 364 ug/dL (ref 250–450)
UIBC: 308 ug/dL

## 2023-09-19 LAB — FERRITIN: Ferritin: 21 ng/mL — ABNORMAL LOW (ref 24–336)

## 2023-09-23 ENCOUNTER — Inpatient Hospital Stay (HOSPITAL_BASED_OUTPATIENT_CLINIC_OR_DEPARTMENT_OTHER): Payer: Medicare Other | Admitting: Oncology

## 2023-09-23 ENCOUNTER — Inpatient Hospital Stay: Payer: Medicare Other

## 2023-09-23 ENCOUNTER — Encounter: Payer: Self-pay | Admitting: Oncology

## 2023-09-23 VITALS — BP 146/80 | HR 55 | Temp 97.7°F | Resp 16 | Wt 168.5 lb

## 2023-09-23 DIAGNOSIS — D509 Iron deficiency anemia, unspecified: Secondary | ICD-10-CM | POA: Diagnosis not present

## 2023-09-23 NOTE — Progress Notes (Signed)
 [facility]  [phone]) [phone] [phone]) [phone]  ID: Cory [name] OB: February 22, 1941  MR#: [id number]  AOZ#:[id number]  Patient Care Team: Dorothe Gaster, NP as PCP - General (Nurse Practitioner) Wenona Hamilton, MD as PCP - Cardiology (Cardiology) Dustin Gimenez, MD as Consulting Physician (Urology) Jonathan Neighbor, [facility] (Inactive) as Pharmacist (Pharmacist) Annell Kidney, MD as Referring Physician (Ophthalmology) Shellie Dials, MD as Consulting Physician (Oncology)  CHIEF COMPLAINT: Iron  deficiency anemia.  INTERVAL HISTORY: Patient returns to clinic today for repeat laboratory work, further evaluation, and consideration of additional IV Venofer .  He continues to feel well and remains asymptomatic.  He does not complain of any weakness or fatigue today. He has no neurologic complaints.  He has a good appetite and denies weight loss.  He has no chest pain, shortness of breath, cough, or hemoptysis.  He denies any nausea, vomiting, constipation, or diarrhea.  He has no melena or hematochezia.  He has no urinary complaints.  Patient offers no specific complaints today.  REVIEW OF SYSTEMS:   Review of Systems  Constitutional: Negative.  Negative for fever, malaise/fatigue and weight loss.  Respiratory: Negative.  Negative for cough, hemoptysis and shortness of breath.   Cardiovascular: Negative.  Negative for chest pain and leg swelling.  Gastrointestinal: Negative.  Negative for abdominal pain, blood in stool and melena.  Genitourinary: Negative.  Negative for hematuria.  Musculoskeletal: Negative.  Negative for back pain and joint pain.  Skin: Negative.  Negative for rash.  Neurological: Negative.  Negative for dizziness, focal weakness, weakness and headaches.  Psychiatric/Behavioral: Negative.  The patient is not nervous/anxious.     As per HPI. Otherwise, a complete review of systems is negative.  PAST MEDICAL HISTORY: Past  Medical History:  Diagnosis Date   Actinic keratosis 08/19/2023   Hypertrophic AK,  R lower lip, recheck at f/u 10/06/23   Anxiety    Basal cell carcinoma 06/25/2023   Central right forehead. Nodular and infiltrative. Mohs pending.   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   Left scalp - EDC   Stroke (HCC)    on Plavix  and ASA   Vasovagal syncope     PAST SURGICAL HISTORY: Past Surgical History:  Procedure Laterality Date   CHOLECYSTECTOMY     CORONARY ARTERY BYPASS GRAFT  2000   DEBRIDEMENT AND CLOSURE WOUND N/A 01/19/2021   Procedure: Closure of scalp defect;  Surgeon: Barb Bonito, MD;  Location: [facility];  Service: Plastics;  Laterality: N/A;   MOHS SURGERY     NECK SURGERY      FAMILY HISTORY: Family History  Problem Relation Age of Onset   Heart Problems Mother    Heart disease Mother    Heart Problems Father    Heart attack Father    Heart disease Brother     ADVANCED DIRECTIVES (Y/N):  N  HEALTH MAINTENANCE: Social History   Tobacco Use   Smoking status: Former    Types: Pipe   Smokeless tobacco: Never  Advertising account planner  Vaping status: Never Used  Substance Use Topics   Alcohol use: Not Currently   Drug use: Never     Colonoscopy:  PAP:  Bone density:  Lipid panel:  Allergies  Allergen Reactions   Fesoterodine Other (See Comments)    Dry mouth and constipation   Isosorbide Other (See Comments)   Other Other (See Comments)   Grass Pollen(K-O-R-T-Swt Vern) Other (See Comments)   Hydrocodone Anxiety   Meperidine Other (See Comments) and Rash     Other reaction(s): Other (See Comments) HALLUCINATIONS    Penicillins Rash    Current Outpatient Medications  Medication Sig Dispense Refill   acetaminophen  (TYLENOL ) 500 MG tablet Take 1,000 mg by mouth at bedtime.     amLODipine  (NORVASC ) 5 MG tablet Take 5 mg by mouth daily.     aspirin  EC 81 MG tablet Take 1 tablet (81 mg total) by mouth daily. Swallow whole. 90 tablet 3   atorvastatin  (LIPITOR) 40 MG tablet Take 1 tablet (40 mg total) by mouth daily. 90 tablet 3   BETIMOL 0.5 % ophthalmic solution Apply to eye.     brimonidine (ALPHAGAN) 0.2 % ophthalmic solution 1 drop 2 (two) times daily.     carvedilol  (COREG ) 12.5 MG tablet Take 1 tablet (12.5 mg total) by mouth 2 (two) times daily with a meal. 180 tablet 3   clopidogrel  (PLAVIX ) 75 MG tablet Take 1 tablet (75 mg total) by mouth daily. 90 tablet 3   Coenzyme Q10 10 MG capsule Take by mouth.     cycloSPORINE  (RESTASIS ) 0.05 % ophthalmic emulsion 1 drop 2 (two) times a day.     diclofenac Sodium (VOLTAREN) 1 % GEL Apply 2 g topically as needed.     escitalopram  (LEXAPRO ) 10 MG tablet Take 5 mg by mouth daily.     famotidine  (PEPCID ) 20 MG tablet Take 20 mg by mouth at bedtime.     fexofenadine  (ALLEGRA  ALLERGY) 180 MG tablet Take 1 tablet (180 mg total) by mouth daily. 30 tablet 1   fluticasone  (FLONASE ) 50 MCG/ACT nasal spray Place 2 sprays into both nostrils daily. 48 g 3   latanoprost  (XALATAN ) 0.005 % ophthalmic solution daily.     montelukast  (SINGULAIR ) 10 MG tablet Take 1 tablet (10 mg total) by mouth at bedtime. 90 tablet 3   nitroGLYCERIN  (NITROSTAT ) 0.4 MG SL tablet Place 1 tablet (0.4 mg total) under the tongue every 5 (five) minutes as needed. 5 tablet 0   pantoprazole  (PROTONIX ) 40 MG tablet Take 1 tablet (40 mg total) by mouth 2 (two) times daily. 60 tablet 0   tamsulosin  (FLOMAX ) 0.4 MG CAPS capsule TAKE 1 CAPSULE EVERY DAY 90 capsule 0   traMADol  (ULTRAM ) 50 MG tablet Take 1 tablet (50 mg total) by mouth every 6  (six) hours as needed for up to 8 doses. 8 tablet 0   Vibegron  (GEMTESA ) 75 MG TABS Take 1 tablet (75 mg total) by mouth daily. 30 tablet 6   No current facility-administered medications for this visit.    OBJECTIVE: Vitals:   09/23/23 1408  BP: (!) 146/80  Pulse: (!) 55  Resp: 16  Temp: 97.7 F (36.5 C)  SpO2: 98%     Body mass index is 28.04 kg/m.    ECOG FS:0 - Asymptomatic  General: Well-developed, well-nourished, no acute distress. Eyes: Pink conjunctiva, anicteric sclera. HEENT: Normocephalic, moist mucous membranes. Lungs: No audible wheezing or coughing. Heart: Regular rate and rhythm. Abdomen: Soft, nontender, no obvious distention. Musculoskeletal: No  edema, cyanosis, or clubbing. Neuro: Alert, answering all questions appropriately. Cranial nerves grossly intact. Skin: No rashes or petechiae noted. Psych: Normal affect.  LAB RESULTS:  Lab Results  Component Value Date   NA 135 07/07/2023   K 4.8 07/07/2023   CL 101 07/07/2023   CO2 28 07/07/2023   GLUCOSE 98 07/07/2023   BUN 13 07/07/2023   CREATININE 1.21 07/07/2023   CALCIUM  9.1 07/07/2023   PROT 8.0 05/12/2023   ALBUMIN 4.5 05/12/2023   AST 38 05/12/2023   ALT 27 05/12/2023   ALKPHOS 54 05/12/2023   BILITOT 0.8 05/12/2023   GFRNONAA 45 (L) 05/12/2023   GFRAA 98 03/13/2020    Lab Results  Component Value Date   WBC 5.1 09/19/2023   NEUTROABS 2.9 09/19/2023   HGB 12.0 (L) 09/19/2023   HCT 37.4 (L) 09/19/2023   MCV 94.0 09/19/2023   PLT 166 09/19/2023   Lab Results  Component Value Date   IRON  56 09/19/2023   TIBC 364 09/19/2023   IRONPCTSAT 15 (L) 09/19/2023   Lab Results  Component Value Date   FERRITIN 21 (L) 09/19/2023     STUDIES: No results found.  ASSESSMENT: Iron  deficiency anemia.  PLAN:    Iron  deficiency anemia: Essentially resolved.  Patient's most recent hemoglobin is 12.0.  His iron  saturation level and ferritin have decreased slightly.  He cannot tolerate oral  iron  supplementation.  He has not had a colonoscopy or EGD in several years.  No intervention is needed at this time.  Patient does not require IV Venofer  today, but suspect he may need treatment at his next clinic visit.  He last received IV Venofer  on March 13, 2023.  Return to clinic in 4 months with repeat laboratory, further evaluation, and continuation of treatment if necessary. Hypertension: Patient's blood pressure remains moderately elevated.  Continue monitoring and treatment per primary care. Joint pain: Patient does not complain of this today.  Continue symptomatic treatment.   Patient expressed understanding and was in agreement with this plan. He also understands that He can call clinic at any time with any questions, concerns, or complaints.    Shellie Dials, MD   09/23/2023 2:30 PM

## 2023-09-26 ENCOUNTER — Other Ambulatory Visit: Payer: Self-pay | Admitting: Cardiovascular Disease

## 2023-09-26 ENCOUNTER — Other Ambulatory Visit: Payer: Self-pay | Admitting: Nurse Practitioner

## 2023-09-26 DIAGNOSIS — Z760 Encounter for issue of repeat prescription: Secondary | ICD-10-CM

## 2023-09-26 DIAGNOSIS — R053 Chronic cough: Secondary | ICD-10-CM

## 2023-09-26 DIAGNOSIS — K219 Gastro-esophageal reflux disease without esophagitis: Secondary | ICD-10-CM

## 2023-09-29 ENCOUNTER — Ambulatory Visit (INDEPENDENT_AMBULATORY_CARE_PROVIDER_SITE_OTHER): Admitting: Podiatry

## 2023-09-29 ENCOUNTER — Encounter: Payer: Self-pay | Admitting: Podiatry

## 2023-09-29 VITALS — Ht 65.0 in | Wt 168.5 lb

## 2023-09-29 DIAGNOSIS — M79674 Pain in right toe(s): Secondary | ICD-10-CM

## 2023-09-29 DIAGNOSIS — E1151 Type 2 diabetes mellitus with diabetic peripheral angiopathy without gangrene: Secondary | ICD-10-CM

## 2023-09-29 DIAGNOSIS — B351 Tinea unguium: Secondary | ICD-10-CM | POA: Diagnosis not present

## 2023-09-29 DIAGNOSIS — M79675 Pain in left toe(s): Secondary | ICD-10-CM | POA: Diagnosis not present

## 2023-10-01 ENCOUNTER — Encounter: Payer: Self-pay | Admitting: Dermatology

## 2023-10-01 ENCOUNTER — Encounter: Payer: Self-pay | Admitting: Pulmonary Disease

## 2023-10-01 ENCOUNTER — Ambulatory Visit: Admitting: Pulmonary Disease

## 2023-10-01 VITALS — BP 124/70 | HR 55 | Temp 97.7°F | Ht 65.0 in | Wt 168.8 lb

## 2023-10-01 DIAGNOSIS — Z87891 Personal history of nicotine dependence: Secondary | ICD-10-CM

## 2023-10-01 DIAGNOSIS — R0602 Shortness of breath: Secondary | ICD-10-CM | POA: Diagnosis not present

## 2023-10-01 MED ORDER — ALBUTEROL SULFATE HFA 108 (90 BASE) MCG/ACT IN AERS: 2.0000 | INHALATION_SPRAY | Freq: Four times a day (QID) | RESPIRATORY_TRACT | 4 refills | Status: AC | PRN

## 2023-10-02 NOTE — Progress Notes (Signed)
  Subjective:  Patient ID: Cory [name], male    DOB: 03-May-1940,  MRN: [id number]  Cory [name] presents to clinic today for at risk foot care. Pt has h/o NIDDM with PAD and painful elongated mycotic toenails 1-5 bilaterally which are tender when wearing enclosed shoe gear. Pain is relieved with periodic professional debridement.  Chief Complaint  Patient presents with   Nail Problem    Pt is here for [facility] PCP is Dr Tivis Forster and LOV was in April.   New problem(s): None.   PCP is Dorothe Gaster, NP.  Allergies  Allergen Reactions   Fesoterodine Other (See Comments)    Dry mouth and constipation   Isosorbide Other (See Comments)   Other Other (See Comments)   Grass Pollen(K-O-R-T-Swt Vern) Other (See Comments)   Hydrocodone Anxiety   Meperidine Other (See Comments) and Rash    Other reaction(s): Other (See Comments) HALLUCINATIONS    Penicillins Rash    Review of Systems: Negative except as noted in the HPI.  Objective: No changes noted in today's physical examination. There were no vitals filed for this visit. Cory [name] is a pleasant 83 y.o. male in NAD. AAO x 3.  Vascular Examination: CFT <4 seconds b/l. DP pulses diminished b/l. PT pulses diminished b/l. Digital hair absent. Skin temperature gradient warm to cool b/l. No ischemia or gangrene. No cyanosis or clubbing noted b/l. Trace edema noted BLE.   Neurological Examination: Protective sensation diminished with 10g monofilament b/l.  Dermatological Examination: Pedal skin thin, shiny and atrophic b/l. No open wounds. No interdigital macerations.   Toenails 1-5 b/l thick, discolored, elongated with subungual debris and pain on dorsal palpation.   No hyperkeratotic nor porokeratotic lesions present on today's visit.  Musculoskeletal Examination: Muscle strength 5/5 to all LE muscle groups of left lower extremity. Decreased dorsiflexion RLE. Pes planus deformity noted bilateral LE. Utilizes cane for  ambulation assistance.  Radiographs: None  Last A1c:      Latest Ref Rng & Units 04/28/2023   12:22 PM 12/27/2022   11:53 AM  Hemoglobin A1C  Hemoglobin-A1c 4.0 - 5.6 % 5.9  6.5     Assessment/Plan: 1. Pain due to onychomycosis of toenails of both feet   2. Type II diabetes mellitus with peripheral circulatory disorder (HCC)    VAS US  ABI WITH/WO TBI -Consent given for treatment as described below: -Examined patient. -Due to risk factor(s), ordered noninvasive arterial studies ABIs with and without TBIs for b/l lower extremities. -Patient to continue soft, supportive shoe gear daily. -Mycotic toenails 1-5 bilaterally were debrided in length and girth with sterile nail nippers and dremel without incident. -Patient/POA to call should there be question/concern in the interim.   Return in about 3 months (around 12/30/2023).  Luella Sager, DPM      [place] LOCATION: [address], [place] [address]                   Office [phone]   [facility] LOCATION: [address] [place], [place] [address] Office [phone]

## 2023-10-05 NOTE — Progress Notes (Signed)
 Synopsis: Referred in by Dorothe Gaster, NP   Subjective:   PATIENT ID: Cory [name] GENDER: male DOB: 04/06/1941, MRN: [id number]  Chief Complaint  Patient presents with   New Patient (Initial Visit)    Chronic Cough, x-ray: 08/20/23  Has been dealing with constant congestion since adjustments were made to his Carvedilol  which has been causing a cough at night. Occasionally some phlegm production but unaware of the color. Has SOB/DOE mostly at night as well with wheezing. No current inhalers or nebulizer's.     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    He reports that he has been having shortness of breath associated with exhaustion and fatigue for few weeks now. He specifically noticed this when his carvedilol  was increased to 12.5 from 6.25 mg BID. He does report some chest tightness and wheezing mostly at night.   He denies any history of pulmonary diseases. He was never a heavy smoker or regular smoker. He smoked pipe in the past and then  cigars occasionally.   CXR 07/2023 - No acute findings.   Echocardiogram 09/2021 - LVEF 55 to 60% with no RWMA, grade I diastolic dysfunction. RV function is normal, estimated RVSP is 41.4 mmHg. No valvular disease.     Family history: He denies any family history of lung diseases.   Social history:  Smoked pipe in the past - occasional cigars smoking. He is a retired Art gallery manager. He has 1 dog at home.   ROS All systems were reviewed and are negative except for the above.  Objective:   Vitals:   10/01/23 1400  BP: 124/70  Pulse: (!) 55  Temp: 97.7 F (36.5 C)  TempSrc: Oral  SpO2: 96%  Weight: 168 lb 12.8 oz (76.6 kg)  Height: 5\' 5"  (1.651 m)   96% on RA BMI Readings from Last 3 Encounters:   10/01/23 28.09 kg/m  09/29/23 28.04 kg/m  09/23/23 28.04 kg/m   Wt Readings from Last 3 Encounters:  10/01/23 168 lb 12.8 oz (76.6 kg)  09/29/23 168 lb 8 oz (76.4 kg)  09/23/23 168 lb 8 oz (76.4 kg)    Physical Exam GEN: NAD, Healthy Appearing HEENT: Supple Neck, Reactive Pupils, EOMI  CVS: Normal S1, Normal S2, RRR, No murmurs or ES appreciated  Lungs: Clear bilateral air entry.  Abdomen: Soft, non tender, non distended, + BS  Extremities: Warm and well perfused, No edema  Skin: No suspicious lesions appreciated  Psych: Normal Affect  Ancillary Information   CBC    Component Value Date/Time   WBC 5.1 09/19/2023 1100   RBC 3.98 (L) 09/19/2023 1100   HGB 12.0 (L) 09/19/2023 1100   HGB 12.1 (L) 03/13/2020 1123   HCT 37.4 (L) 09/19/2023 1100   HCT 37.0 (L) 03/13/2020 1123   PLT 166 09/19/2023 1100   PLT 216 03/13/2020 1123   MCV 94.0 09/19/2023 1100   MCV 93 03/13/2020 1123   MCH 30.2 09/19/2023 1100   MCHC 32.1 09/19/2023 1100   RDW 12.4 09/19/2023 1100   RDW 12.3 03/13/2020 1123   LYMPHSABS 1.2 09/19/2023 1100   LYMPHSABS 1.0 03/13/2020 1123   MONOABS 0.6 09/19/2023 1100   EOSABS 0.3 09/19/2023 1100   EOSABS 0.1 03/13/2020 1123   BASOSABS 0.1 09/19/2023  1100   BASOSABS 0.0 03/13/2020 1123   Labs and imaging were reviewed.      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    #Shortness of breath  He has noticed his shorntess of breath after his carvedilol  was doubled few weeks ago. He does report night time wheezing and chest tightness which could be associated with obstructive lung disease thou his smoking history has been underwhelming. He does not appear to be in congestive heart  failure and is euvolemic on exam. I will order PFTs for further evaluation and trial as needed albuterol . I will also discuss Carvedilol  dose reduction with his cardiologist.   Return in about 3 months (around 01/01/2024).  I spent 60 minutes caring for this patient today, including preparing to see the patient, obtaining a medical history , reviewing a separately obtained history, performing a medically appropriate examination and/or evaluation, counseling and educating the patient/family/caregiver, ordering medications, tests, or procedures, documenting clinical information in the electronic health record, and independently interpreting results (not separately reported/billed) and communicating results to the patient/family/caregiver  Annitta Kindler, MD [facility] 10/05/2023 5:52 PM

## 2023-10-06 ENCOUNTER — Telehealth: Payer: Self-pay | Admitting: *Deleted

## 2023-10-06 ENCOUNTER — Ambulatory Visit (INDEPENDENT_AMBULATORY_CARE_PROVIDER_SITE_OTHER): Admitting: Dermatology

## 2023-10-06 ENCOUNTER — Encounter: Payer: Self-pay | Admitting: Dermatology

## 2023-10-06 DIAGNOSIS — W908XXA Exposure to other nonionizing radiation, initial encounter: Secondary | ICD-10-CM | POA: Diagnosis not present

## 2023-10-06 DIAGNOSIS — S0081XA Abrasion of other part of head, initial encounter: Secondary | ICD-10-CM

## 2023-10-06 DIAGNOSIS — L570 Actinic keratosis: Secondary | ICD-10-CM

## 2023-10-06 DIAGNOSIS — Z872 Personal history of diseases of the skin and subcutaneous tissue: Secondary | ICD-10-CM

## 2023-10-06 MED ORDER — CARVEDILOL 6.25 MG PO TABS: 6.2500 mg | ORAL_TABLET | Freq: Two times a day (BID) | ORAL | 1 refills | Status: DC

## 2023-10-06 NOTE — Patient Instructions (Addendum)

## 2023-10-06 NOTE — Telephone Encounter (Signed)
 Patient has been made aware of the message from Dr. Alvenia Aus. A new script has been sent into [facility], per his request.   Edwina Gram,  Please asked the patient to decrease carvedilol  back to 6.25 mg twice daily.   Thanks

## 2023-10-06 NOTE — Progress Notes (Signed)
 Follow-Up Visit   Subjective  Cory [name] is a 83 y.o. male who presents for the following: 6 week biopsy follow up at right lower lip. Patient had bx 08/19/23 and results showed a hypertrophic AK with features of a verruca. Patient with a few other spots to have checked at face, neck. Scabbed spot at left cheek present for a few weeks.   The patient has spots, moles and lesions to be evaluated, some may be new or changing and the patient may have concern these could be cancer.   The following portions of the chart were reviewed this encounter and updated as appropriate: medications, allergies, medical history  Review of Systems:  No other skin or systemic complaints except as noted in HPI or Assessment and Plan.  Objective  Well appearing patient in no apparent distress; mood and affect are within normal limits.   A focused examination was performed of the following areas: face  Relevant exam findings are noted in the Assessment and Plan.  R postauricular x 1, R sup helix x 2, L postauricular x 1, L post neck x 1, L post ear x 1, L sup helix x 1, L preauricular x 1 (8) Erythematous thin papules/macules with gritty scale.   Assessment & Plan   HISTORY OF PRECANCEROUS ACTINIC KERATOSIS - site(s) of PreCancerous Actinic Keratosis clear today at right lower lip. - these may recur and new lesions may form requiring treatment to prevent transformation into skin cancer - observe for new or changing spots and contact [facility] for appointment if occur - photoprotection with sun protective clothing; sunglasses and broad spectrum sunscreen with SPF of at least 30 + and frequent self skin exams recommended - yearly exams by a dermatologist recommended for persons with history of PreCancerous Actinic Keratoses  EXCORIATION Exam: hemorrhagic crusted plaque at left preauricular and pink scaly papule at left zygoma  Treatment Plan: Biopsy offered today to check for skin  cancer, patient defers until follow up. Recommend vaseline. Call if not resolving.  AK (ACTINIC KERATOSIS) (8) R postauricular x 1, R sup helix x 2, L postauricular x 1, L post neck x 1, L post ear x 1, L sup helix x 1, L preauricular x 1 (8) Actinic keratoses are precancerous spots that appear secondary to cumulative UV radiation exposure/sun exposure over time. They are chronic with expected duration over 1 year. A portion of actinic keratoses will progress to squamous cell carcinoma of the skin. It is not possible to reliably predict which spots will progress to skin cancer and so treatment is recommended to prevent development of skin cancer.  Recommend daily broad spectrum sunscreen SPF 30+ to sun-exposed areas, reapply every 2 hours as needed.  Recommend staying in the shade or wearing long sleeves, sun glasses (UVA+UVB protection) and wide brim hats (4-inch brim around the entire circumference of the hat). Call for new or changing lesions. Destruction of lesion - R postauricular x 1, R sup helix x 2, L postauricular x 1, L post neck x 1, L post ear x 1, L sup helix x 1, L preauricular x 1 (8) Complexity: simple   Destruction method: cryotherapy   Informed consent: discussed and consent obtained   Timeout:  patient name, date of birth, surgical site, and procedure verified Lesion destroyed using liquid nitrogen: Yes   Region frozen until ice ball extended beyond lesion: Yes   Cryo cycles: 1 or 2. Outcome: patient tolerated procedure well with no complications  Post-procedure details: wound care instructions given    Return for Biopsy, next available, with Dr. Felipe Horton.  Kerstin Peeling, RMA, am acting as scribe for Harris Liming, MD .   Documentation: I have reviewed the above documentation for accuracy and completeness, and I agree with the above.  Harris Liming, MD

## 2023-10-09 ENCOUNTER — Ambulatory Visit: Admitting: Dermatology

## 2023-10-14 ENCOUNTER — Ambulatory Visit (INDEPENDENT_AMBULATORY_CARE_PROVIDER_SITE_OTHER): Admitting: Dermatology

## 2023-10-14 ENCOUNTER — Encounter: Payer: Self-pay | Admitting: Dermatology

## 2023-10-14 DIAGNOSIS — D492 Neoplasm of unspecified behavior of bone, soft tissue, and skin: Secondary | ICD-10-CM

## 2023-10-14 DIAGNOSIS — D485 Neoplasm of uncertain behavior of skin: Secondary | ICD-10-CM

## 2023-10-14 DIAGNOSIS — D0439 Carcinoma in situ of skin of other parts of face: Secondary | ICD-10-CM

## 2023-10-14 DIAGNOSIS — D099 Carcinoma in situ, unspecified: Secondary | ICD-10-CM

## 2023-10-14 HISTORY — DX: Carcinoma in situ, unspecified: D09.9

## 2023-10-14 NOTE — Patient Instructions (Signed)

## 2023-10-14 NOTE — Progress Notes (Signed)
   Follow-Up Visit   Subjective  Cory [name] is a 83 y.o. male who presents for the following: Bx of irregular skin lesion on the L face.  The following portions of the chart were reviewed this encounter and updated as appropriate: medications, allergies, medical history  Review of Systems:  No other skin or systemic complaints except as noted in HPI or Assessment and Plan.  Objective  Well appearing patient in no apparent distress; mood and affect are within normal limits.  A focused examination was performed of the following areas: the face   Relevant exam findings are noted in the Assessment and Plan.  L preauricular 0.7 cm pink papule with hemorrhagic crusting.   Assessment & Plan   NEOPLASM OF UNCERTAIN BEHAVIOR OF SKIN L preauricular Skin / nail biopsy Type of biopsy: tangential   Informed consent: discussed and consent obtained   Timeout: patient name, date of birth, surgical site, and procedure verified   Procedure prep:  Patient was prepped and draped in usual sterile fashion Prep type:  Isopropyl alcohol Anesthesia: the lesion was anesthetized in a standard fashion   Anesthetic:  1% lidocaine  w/ epinephrine  1-100,000 buffered w/ 8.4% NaHCO3 Instrument used: flexible razor blade   Hemostasis achieved with: pressure, aluminum chloride and electrodesiccation   Outcome: patient tolerated procedure well   Post-procedure details: sterile dressing applied and wound care instructions given   Dressing type: bandage (Mupirocin 2% ointment)   Specimen 1 - Surgical pathology Differential Diagnosis: D48.5 r/o BCC vs SCC Check Margins: No L zygoma clear Return for appointment as scheduled.  Arlinda Lais, CMA, am acting as scribe for Harris Liming, MD .   Documentation: I have reviewed the above documentation for accuracy and completeness, and I agree with the above.  Harris Liming, MD

## 2023-10-17 ENCOUNTER — Ambulatory Visit: Payer: Self-pay | Admitting: Dermatology

## 2023-10-17 LAB — SURGICAL PATHOLOGY

## 2023-10-20 MED ORDER — FLUOROURACIL 5 % EX CREA: TOPICAL_CREAM | CUTANEOUS | 2 refills | Status: DC

## 2023-10-20 NOTE — Telephone Encounter (Signed)
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   Explanation: Biopsy shows a squamous cell skin cancer limited to the top layer of skin. This means it is an early cancer and has not spread. However, it has the potential to spread beyond the skin  and threaten your health, so we recommend treating it.   Treatment option 1: a cream (fluorouracil  and calcipotriene) that helps your immune system clear the skin cancer. It will cause redness and irritation. Wait two weeks after the biopsy to start  applying the cream. Apply the cream twice per day until the redness and irritation develop (usually occurs by day 7), then stop and allow it to heal. We will recheck the area in 2 months to ensure  the cancer is gone. The cream is $45 plus shipping and will be mailed to you from a low cost compounding pharmacy.  Treatment option 2:  Mohs surgery, which involves cutting out right around the skin cancer and then checking under the microscope on the same day to ensure the whole skin cancer is out. If there is  more cancer remaining, the surgeon will repeat the process until it is fully cleared. The cure rate is about 98-99%. It is done at another office outside of [place] ([place], [place], or  [place]). Once the Mohs surgeon confirms the skin cancer is out, they will discuss the options to repair or heal the area. You must take it easy for about two weeks after surgery (no lifting over  10-15 lbs, avoid activity to get your heart rate and blood pressure up). ----- Message ----- From: Interface, Lab In Three Zero One Sent: 10/17/2023  11:54 AM EDT To: Boneta Sharps, MD

## 2023-10-20 NOTE — Telephone Encounter (Signed)
 Discussed pathology results and treatment options. Patient prefers topical treatment at this time. 5FU/Calcipotriene sent to [facility] pharmacy. Wait two weeks after the biopsy to start applying the cream. Apply the cream twice per day until the redness and irritation develop (usually occurs by day 7), then stop and allow it to heal. Recheck in 2 months.  Directions also sent to patient in MyChart message.   Rescheduled 10/2023 appt to 12/22/23.

## 2023-11-04 ENCOUNTER — Ambulatory Visit (INDEPENDENT_AMBULATORY_CARE_PROVIDER_SITE_OTHER)

## 2023-11-04 DIAGNOSIS — E1151 Type 2 diabetes mellitus with diabetic peripheral angiopathy without gangrene: Secondary | ICD-10-CM

## 2023-11-06 LAB — VAS US ABI WITH/WO TBI
Left ABI: 1.02
Right ABI: 1.11

## 2023-11-07 ENCOUNTER — Telehealth: Payer: Self-pay

## 2023-11-07 ENCOUNTER — Ambulatory Visit: Payer: Self-pay

## 2023-11-07 NOTE — Telephone Encounter (Signed)
 FYI Only or Action Required?: FYI only for provider.  Patient was last seen in primary care on 08/20/2023 by Wendee [name] HERO, NP.  Called Nurse Triage reporting Elbow Injury.  Symptoms began several weeks ago.  Interventions attempted: Nothing.  Symptoms are: gradually worsening.  Triage Disposition: See Physician Within 24 Hours - Patient opted to go to [facility].   Patient/caregiver understands and will follow disposition?: Yes  Copied from CRM [id number]. Topic: Clinical - Red Word Triage >> Nov 07, 2023 10:04 AM Gustabo D wrote: Patient says he needs to get his elbow checked he fell 3 weeks ago hitting his elbow and says some kind of fluid is in his elbow like a sac. Reason for Disposition  [1] MODERATE pain (e.g., interferes with normal activities) AND [2] high-risk adult (e.g., age > 60 years, osteoporosis, chronic steroid use)  Answer Assessment - Initial Assessment Questions Patient is on aspirin  and clopidogrel   1. MECHANISM: How did the injury happen?     Fall 2. ONSET: When did the injury happen? (e.g., minutes, hours ago)      3 weeks post fall 3. LOCATION: What part of the elbow is injured?      Left elbow 4. APPEARANCE of INJURY: What does the injury look like?      Reports pocket of fluid with bruising 5. SEVERITY: Can you use the elbow normally?  Can you bend it and straighten it fully?     Tenderness when laying on it or touching it 6. SIZE: For cuts, bruises, or swelling, ask: How large is it? (e.g., inches or centimeters; entire joint)      Smaller than golf pain 7. PAIN: Is there pain? If Yes, ask: How bad is the pain?  (Scale 0-10; or none, mild, moderate, severe)     4-5/10 8. TETANUS: For any breaks in the skin, ask: When was your last tetanus booster?     No 9. OTHER SYMPTOMS: Do you have any other symptoms?  (e.g., numbness in hand)     No  Protocols used: Elbow Injury-A-AH

## 2023-11-07 NOTE — Telephone Encounter (Signed)
 Can we call the pharmacy and see if they will transfer then to [facility] please

## 2023-11-07 NOTE — Telephone Encounter (Signed)
 Noted

## 2023-11-07 NOTE — Telephone Encounter (Signed)
 Copied from CRM [id number]. Topic: Clinical - Medication Question >> Nov 07, 2023 10:06 AM Gustabo D wrote: [facility] [place] [place] - [address] [place] [address] Phone: [phone] Fax: [phone] Hours: Not open 24 hours

## 2023-11-12 NOTE — Progress Notes (Unsigned)
 11/13/2023 9:48 PM   Cory [name] November 16, 1985 [id number]  Referring provider: Wendee Cory HERO, NP [address] [place],  [place] [address]  Urological history 1. BPH with LU TS -aged out of PSA screening -cysto 06/2020 - mild prostamegaly with bladder trabeculation -tamsulosin  0.4 mg daily  2. Urinary frequency -contributing factors of age, BPH, stroke, HTN, diabetes, anxiety and depression -Gemtesa  75 mg daily   3. Sepsis E. Coli bacteremia secondary to UTI 09/2021 -non-contrast CT, 09/2021 - NED  No chief complaint on file.   HPI: Cory [name] is a 83 y.o. man who presents today for 12 month follow up.  Previous records reviewed.    PMH: Past Medical History:  Diagnosis Date   Actinic keratosis 08/19/2023   Hypertrophic AK,  R lower lip, recheck at f/u 10/06/23   Anxiety    Basal cell carcinoma 06/25/2023   Central right forehead. Nodular and infiltrative. Mohs pending.   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 Tx with 5FU/Calcipotriene. Recheck 12/22/23   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   Left scalp - EDC   Stroke (HCC)    on Plavix  and ASA   Vasovagal syncope     Surgical History: Past Surgical History:  Procedure Laterality Date   CHOLECYSTECTOMY     CORONARY ARTERY BYPASS GRAFT  2000   DEBRIDEMENT AND CLOSURE WOUND N/A 01/19/2021   Procedure: Closure of scalp defect;  Surgeon: Elisabeth Craig RAMAN, MD;  Location:  SURGERY CENTER;  Service: Plastics;  Laterality: N/A;   MOHS SURGERY     NECK SURGERY       Home Medications:  Allergies as of 11/13/2023       Reactions   Fesoterodine Other (See Comments)   Dry mouth and constipation   Isosorbide Other (See Comments)   Other Other (See Comments)   Grass Pollen(k-o-r-t-swt Vern) Other (See Comments)   Hydrocodone Anxiety   Meperidine Other (See Comments), Rash   Other reaction(s): Other (See Comments) HALLUCINATIONS   Penicillins Rash        Medication List        Accurate as of November 12, 2023  9:48 PM. If you have any questions, ask your nurse or doctor.          acetaminophen  500 MG tablet Commonly known as: TYLENOL  Take 1,000 mg by mouth at bedtime.   albuterol  108 (90 Base) MCG/ACT inhaler Commonly known as: VENTOLIN  HFA Inhale 2 puffs into the lungs every 6 (six) hours as needed for wheezing or shortness of breath.   amLODipine  5 MG tablet Commonly known as: NORVASC  TAKE 1 TABLET EVERY DAY   aspirin  EC 81 MG tablet Take 1 tablet (81 mg total) by mouth daily. Swallow whole.   atorvastatin  40 MG tablet Commonly known as: LIPITOR Take 1 tablet (40 mg total) by mouth daily.   carvedilol  6.25 MG tablet Commonly known as: COREG  Take 1 tablet (6.25 mg total) by mouth 2 (two) times daily with a  meal.   clopidogrel  75 MG tablet Commonly known as: PLAVIX  Take 1 tablet (75 mg total) by mouth daily.   Coenzyme Q10 10 MG capsule Take by mouth.   diclofenac Sodium 1 % Gel Commonly known as: VOLTAREN Apply 2 g topically as needed.   dorzolamide-timolol 2-0.5 % ophthalmic solution Commonly known as: COSOPT Place 1 drop into both eyes 2 (two) times daily.   escitalopram  10 MG tablet Commonly known as: LEXAPRO  Take 5 mg by mouth daily.   famotidine  20 MG tablet Commonly known as: PEPCID  Take 20 mg by mouth at bedtime.   fexofenadine  180 MG tablet Commonly known as: Allegra  Allergy Take 1 tablet (180 mg total) by mouth daily.   fluorouracil  5 % cream Commonly known as: EFUDEX  Wait two weeks after the  biopsy before starting the cream. Apply the cream twice per day to the area where the skin cancer was and a quarter inch around that area. Apply until the redness and irritation develop (usually occurs by day 7), then stop and allow it to heal.   fluticasone  50 MCG/ACT nasal spray Commonly known as: FLONASE  Place 2 sprays into both nostrils daily.   latanoprost  0.005 % ophthalmic solution Commonly known as: XALATAN  daily.   montelukast  10 MG tablet Commonly known as: SINGULAIR  Take 1 tablet (10 mg total) by mouth at bedtime.   nitroGLYCERIN  0.4 MG SL tablet Commonly known as: NITROSTAT  PLACE 1 TABLET UNDER THE TONGUE EVERY 5 MINUTES AS NEEDED.   pantoprazole  40 MG tablet Commonly known as: PROTONIX  TAKE 1 TABLET EVERY DAY   tamsulosin  0.4 MG Caps capsule Commonly known as: FLOMAX  TAKE 1 CAPSULE EVERY DAY   traMADol  50 MG tablet Commonly known as: ULTRAM  Take 1 tablet (50 mg total) by mouth every 6 (six) hours as needed for up to 8 doses.        Allergies:  Allergies  Allergen Reactions   Fesoterodine Other (See Comments)    Dry mouth and constipation   Isosorbide Other (See Comments)   Other Other (See Comments)   Grass Pollen(K-O-R-T-Swt Vern) Other (See Comments)   Hydrocodone Anxiety   Meperidine Other (See Comments) and Rash    Other reaction(s): Other (See Comments) HALLUCINATIONS    Penicillins Rash    Family History: Family History  Problem Relation Age of Onset   Heart Problems Mother    Heart disease Mother    Heart Problems Father    Heart attack Father    Heart disease Brother     Social History:  reports that he has quit smoking. His smoking use included pipe. He has never used smokeless tobacco. He reports that he does not currently use alcohol. He reports that he does not use drugs.  ROS: Pertinent ROS in HPI  Physical Exam: There were no vitals taken for this visit.  Constitutional:  Well nourished. Alert and oriented, No acute  distress. HEENT: [place] AT, moist mucus membranes.  Trachea midline Cardiovascular: No clubbing, cyanosis, or edema. Respiratory: Normal respiratory effort, no increased work of breathing. Neurologic: Grossly intact, no focal deficits, moving all 4 extremities. Psychiatric: Normal mood and affect.   Laboratory Data:    Latest Ref Rng & Units 07/07/2023    2:24 PM 05/12/2023    9:37 PM 05/12/2023    5:36 PM  CMP  Glucose 70 - 99 mg/dL 98   870   BUN 6 - 23 mg/dL 13   19   Creatinine 9.59 - 1.50 mg/dL 8.78  1.51   Sodium 135 - 145 mEq/L 135   134   Potassium 3.5 - 5.1 mEq/L 4.8   4.0   Chloride 96 - 112 mEq/L 101   101   CO2 19 - 32 mEq/L 28   20   Calcium  8.4 - 10.5 mg/dL 9.1   9.3   Total Protein 6.5 - 8.1 g/dL  8.0    Total Bilirubin 0.0 - 1.2 mg/dL  0.8    Alkaline Phos 38 - 126 U/L  54    AST 15 - 41 U/L  38    ALT 0 - 44 U/L  27    I have reviewed the labs.   Pertinent Imaging:  ***  Assessment & Plan:    1. BPH with LUTS -continue tamsulosin  0.4 mg daily  2. Urgency - Gemtesa  75 mg daily   No follow-ups on file.  These notes generated with voice recognition software. I apologize for typographical errors.  Cory [name]  [facility] [address]  [address] [place], [place] [address] [phone]

## 2023-11-13 ENCOUNTER — Encounter: Payer: Self-pay | Admitting: Urology

## 2023-11-13 ENCOUNTER — Ambulatory Visit (INDEPENDENT_AMBULATORY_CARE_PROVIDER_SITE_OTHER): Payer: Self-pay | Admitting: Urology

## 2023-11-13 VITALS — BP 155/71 | HR 66 | Ht 65.0 in | Wt 164.1 lb

## 2023-11-13 DIAGNOSIS — N401 Enlarged prostate with lower urinary tract symptoms: Secondary | ICD-10-CM | POA: Diagnosis not present

## 2023-11-13 DIAGNOSIS — N138 Other obstructive and reflux uropathy: Secondary | ICD-10-CM | POA: Diagnosis not present

## 2023-11-13 DIAGNOSIS — R350 Frequency of micturition: Secondary | ICD-10-CM

## 2023-11-13 LAB — BLADDER SCAN AMB NON-IMAGING

## 2023-11-13 NOTE — Patient Instructions (Signed)
 Please call [phone] if you have not heard from the PT department by next week

## 2023-11-14 ENCOUNTER — Ambulatory Visit: Payer: Medicare Other | Admitting: Urology

## 2023-11-19 ENCOUNTER — Encounter: Payer: Self-pay | Admitting: Nurse Practitioner

## 2023-11-19 ENCOUNTER — Ambulatory Visit (INDEPENDENT_AMBULATORY_CARE_PROVIDER_SITE_OTHER)
Admission: RE | Admit: 2023-11-19 | Discharge: 2023-11-19 | Disposition: A | Discharge status: Home / Self Care | Source: Ambulatory Visit | Attending: Nurse Practitioner

## 2023-11-19 ENCOUNTER — Ambulatory Visit (INDEPENDENT_AMBULATORY_CARE_PROVIDER_SITE_OTHER): Admitting: Nurse Practitioner

## 2023-11-19 VITALS — BP 138/64 | HR 52 | Temp 98.2°F | Ht 65.0 in | Wt 166.4 lb

## 2023-11-19 DIAGNOSIS — E1165 Type 2 diabetes mellitus with hyperglycemia: Secondary | ICD-10-CM

## 2023-11-19 DIAGNOSIS — M25522 Pain in left elbow: Secondary | ICD-10-CM | POA: Insufficient documentation

## 2023-11-19 DIAGNOSIS — W19XXXA Unspecified fall, initial encounter: Secondary | ICD-10-CM

## 2023-11-19 NOTE — Patient Instructions (Signed)
 Nice to see you today  I will be in touch with the labs and xray once I have them Follow up with me in 6 months, sooner if you need me

## 2023-11-19 NOTE — Assessment & Plan Note (Signed)
 Central fall striking left elbow with what seems to be some olecranon effusion.  Patient having tenderness when sitting elbow down will obtain elbow x-ray.  Bursa not really palpable but can consider sports medicine evaluation if no improvement

## 2023-11-19 NOTE — Assessment & Plan Note (Signed)
 Currently diet controlled pending A1c today

## 2023-11-19 NOTE — Assessment & Plan Note (Signed)
 Status post fall.  Bruising noted pending x-ray this was over a month ago

## 2023-11-19 NOTE — Progress Notes (Signed)
 Established Patient Office Visit  Subjective   Patient ID: Cory [name], male    DOB: Nov 09, 1940  Age: 83 y.o. MRN: [id number]  Chief Complaint  Patient presents with   Follow-up    BP   Joint Swelling    Left elbow feels like a pocket of fluid is in it. This occurred after his fall.     HPI  HTN: Patient currently maintained on amlodipine  5 mg daily and carvedilol  6.25 mg daily.  ARB was discontinued by cardiology and patient was endorsing some fatigue likely secondary to increase in beta-blocker use. State that he does have some lightheadedness.  Cough: Patient has tried heartburn medicine, antihistamine, switching from ACE/ARB.  At last office visit we did get a chest x-ray. That showed no acute findings.  Patient was referred to pulmonology on 08/21/2023.  Patient was seen on 10/01/2023.  Pulmonology queries if it was related to increasing carvedilol .  He did order PFTs and mention he was going to discuss lowering beta-blocker dose with cardiology.  He has went and gotten PFTs done he does have follow-up with pulmonology coming up cough is still present per his report  States that he fell June 17th he got dizzy. States that he leanded down to pick something and kept going. He landed on his elbow and a piece of furniture. States that the elbow is still bothering him. States that it has improved some. States that it  does not hurt to move just with pressure. States that he is taking tylenol      Review of Systems  Constitutional:  Negative for chills and fever.  Respiratory:  Negative for shortness of breath.   Cardiovascular:  Negative for chest pain.  Neurological:  Positive for dizziness. Negative for headaches.      Objective:     BP 138/64   Pulse (!) 52   Temp 98.2 F (36.8 C) (Oral)   Ht 5' 5 (1.651 m)   Wt 166 lb 6.4 oz (75.5 kg)   SpO2 97%   BMI 27.69 kg/m  BP Readings from Last 3 Encounters:  11/19/23 138/64  11/13/23 (!) 155/71  10/01/23 124/70   Wt  Readings from Last 3 Encounters:  11/19/23 166 lb 6.4 oz (75.5 kg)  11/13/23 164 lb 1.6 oz (74.4 kg)  10/01/23 168 lb 12.8 oz (76.6 kg)   SpO2 Readings from Last 3 Encounters:  11/19/23 97%  10/01/23 96%  09/23/23 98%      Physical Exam Vitals and nursing note reviewed.  Constitutional:      Appearance: Normal appearance.  Cardiovascular:     Rate and Rhythm: Normal rate and regular rhythm.     Pulses:          Radial pulses are 2+ on the right side and 2+ on the left side.     Heart sounds: Normal heart sounds.  Pulmonary:     Effort: Pulmonary effort is normal.     Breath sounds: Normal breath sounds.  Musculoskeletal:        General: Tenderness present.  Neurological:     Mental Status: He is alert.      No results found for any visits on 11/19/23.    The ASCVD Risk score (Arnett DK, et al., 2019) failed to calculate for the following reasons:   The 2019 ASCVD risk score is only valid for ages 69 to 81   Risk score cannot be calculated because patient has a medical history suggesting prior/existing ASCVD  Assessment & Plan:   Problem List Items Addressed This Visit       Endocrine   Type 2 diabetes mellitus with hyperglycemia, without long-term current use of insulin  (HCC)   Currently diet controlled pending A1c today      Relevant Orders   CBC   Basic metabolic panel with GFR   Hemoglobin A1c     Other   Fall - Primary   Central fall striking left elbow with what seems to be some olecranon effusion.  Patient having tenderness when sitting elbow down will obtain elbow x-ray.  Bursa not really palpable but can consider sports medicine evaluation if no improvement      Relevant Orders   DG Elbow Complete Left   Left elbow pain   Status post fall.  Bruising noted pending x-ray this was over a month ago      Relevant Orders   DG Elbow Complete Left    Return in about 6 months (around 05/21/2024) for CPE.    Adina Crandall, NP

## 2023-11-20 ENCOUNTER — Ambulatory Visit: Payer: Medicare Other | Admitting: Dermatology

## 2023-11-20 ENCOUNTER — Ambulatory Visit: Payer: Self-pay | Admitting: Nurse Practitioner

## 2023-11-20 LAB — BASIC METABOLIC PANEL WITH GFR
BUN: 18 mg/dL (ref 6–23)
CO2: 24 meq/L (ref 19–32)
Calcium: 8.9 mg/dL (ref 8.4–10.5)
Chloride: 102 meq/L (ref 96–112)
Creatinine, Ser: 1.08 mg/dL (ref 0.40–1.50)
GFR: 62.98 mL/min (ref >60.00)
Glucose, Bld: 93 mg/dL (ref 70–99)
Potassium: 4.1 meq/L (ref 3.5–5.1)
Sodium: 134 meq/L — ABNORMAL LOW (ref 135–145)

## 2023-11-20 LAB — CBC
HCT: 36.6 % — ABNORMAL LOW (ref 39.0–52.0)
Hemoglobin: 11.9 g/dL — ABNORMAL LOW (ref 13.0–17.0)
MCHC: 32.4 g/dL (ref 30.0–36.0)
MCV: 92.4 fl (ref 78.0–100.0)
Platelets: 174 K/uL (ref 150.0–400.0)
RBC: 3.96 Mil/uL — ABNORMAL LOW (ref 4.22–5.81)
RDW: 13 % (ref 11.5–15.5)
WBC: 5.7 K/uL (ref 4.0–10.5)

## 2023-11-20 LAB — HEMOGLOBIN A1C: Hgb A1c MFr Bld: 6.6 % — ABNORMAL HIGH (ref 4.6–6.5)

## 2023-11-27 ENCOUNTER — Telehealth: Payer: Self-pay

## 2023-11-27 DIAGNOSIS — I6930 Unspecified sequelae of cerebral infarction: Secondary | ICD-10-CM

## 2023-11-27 MED ORDER — ATORVASTATIN CALCIUM 40 MG PO TABS: 40.0000 mg | ORAL_TABLET | Freq: Every day | ORAL | 3 refills | Status: AC

## 2023-11-27 NOTE — Telephone Encounter (Signed)
 LAST APPOINTMENT DATE: 11/19/2023 NEXT APPOINTMENT DATE: 05/27/2024  Atorvastatin   LAST REFILL: 08/07/23 QTY: 90 tablet 3RF   Pt requests to have prescription sent to [facility] pharmacy

## 2023-12-03 NOTE — Progress Notes (Addendum)
 Cardiology Clinic Note   Date: 12/05/2023 ID: MONTE, ZINNI 10/07/1940, MRN [id number]  Primary Cardiologist:  Deatrice Cage, MD  Chief Complaint   TAEVYN HAUSEN III is a 83 y.o. male who presents to the clinic today for routine follow up.   Patient Profile   JAMESYN LINDELL III is followed by Dr. Cage for the history outlined below.       Past medical history significant for: CAD. CABG x 7 2000 performed in [place], [place]. LHC 04/07/2018 (angina) performed at [facility] hospital: Occlusion very proximal RCA and LCx.  Severe narrowing of LAD.  Patent LIMA to LAD, sequential SVG to diagonal and the distal LAD, SVG to ramus, SVG to OM and SVG to PDA  New severe narrowing of distal portion of graft to upper OM.  PCI with DES 2.75 x 12 mm to SVG to upper OM.  Anatomy otherwise unchanged from 2016. Thoracic aortic aneurysm. CTA chest abdomen pelvis 04/12/2023: Ascending thoracic aortic aneurysm 4.3 cm (previously 4 cm).  Recommend annual surveillance. PAD. ABI/lower extremity arterial ultrasound 11/17/2020: Right near normal examination.  Left 75 to 99% stenosis noted in the popliteal artery.  Scant flow in proximal SFA.  Subtotal occlusion cannot be ruled out.  Right ABI without evidence of significant arterial disease.  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 Carotid artery stenosis. Carotid duplex 04/29/2023: Right ICA 40 to 59%, right ECA > 50%.  Left ICA 1 to 39%, left ECA <50%. Chronic diastolic heart failure. Echo 10/21/2021: EF 55 to 60%.  No RWMA.  Mild concentric LVH.  Grade 1 DD.  Normal RV size/function.  Mildly elevated PA pressure, RVSP 41.4 mmHg.  Moderate LAE.  Mild MR.  Aortic valve sclerosis/calcification without stenosis.  Mild dilatation of ascending aorta 39 mm. Hypertension. Hyperlipidemia. CVA. Right hemiparesis. T2DM GERD.  In summary, patient with a history of CAD s/p CABG x 7 in 2000 performed in [place], [place].  Heart  catheterization in December 2019 showed new severe narrowing of distal portion of graft to upper OM and patient underwent PCI with DES to SVG to OM (further details above).  History of CVA in 2015 and 2 subsequent TIAs since that time.  Patient was initially followed by Dr. [place] in [place].  He transferred his care to Dr. Cage in August 2022 to be seen closer to home.  Patient underwent LPa ultrasound July 2022 which showed moderate left lower extremity arterial disease which Dr. Cage to treated with aggressive medical management.  Carotid ultrasound November 2022 showed right ICA 40 to 59% and left ICA 1 to 39%.  Follow-up in November 2022 patient reported improvement in claudication symptoms and medical therapy was continued.  Patient was evaluated in the ED on 04/12/2023 for chest pain. His workup was unremarkable. Troponin negative x 2. CTA negative for PE. EKG was reviewed by cardiology and unchanged from prior.   Patient was seen in the office on 05/01/2023 for routine follow-up.  He had reported increased epigastric discomfort improved with Protonix  prescribed by PCP.  He also reported left-sided chest discomfort that prompted an ED visit as described above.  He reported occasional dizziness when putting on socks or shoes and described dropping his head toward the floor to accomplish this task.  It was suggested he stand and prop his foot up so he was not bending over with his head facing down to perform these activities.  A nuclear stress test was ordered to further evaluate his  chest pain.  He contacted the office after his visit to report he did not feel the stress test was warranted as he was asymptomatic.  Patient underwent short hospital admission 05/12/2023 to 05/13/2023 for weakness, near syncope, nausea, vomiting, diarrhea.  He was treated with antiemetics and fluid resuscitation.  CT scan was negative for acute abnormalities and consistent with inflammatory changes of colitis.  He was  discharged with a 5-day course of Bactrim .   Patient was last seen in the office by me on 08/14/2023 for routine follow-up.  He reported lisinopril  was changed to losartan  secondary to head stuffiness but upon further review it appeared he was changed secondary to chronic cough.  He continued to have elevated BP and losartan  was increased however he had not started the increased dose as he was uncertain why he needed to.  He reported continued dry cough.  He was not checking BP at home secondary to feeling his cuff was too big.  BP was relatively well-controlled at the time of his visit with intake BP 140/72 and recheck BP 138/78.  Secondary to persistent cough he was instructed to hold losartan  to see if it improved.  Carvedilol  was increased to 12.5 mg twice daily.  He was provided with a BP log to check BP at home.  He was scheduled to follow-up with PCP the following week and was instructed to bring BP log with him.  Patient was evaluated by PCP on 08/20/2023.  BP was 140/70 at that time.  Heart rate was 55 bpm just slightly lower than when he was seen by cardiology the week prior.  He reported some increased fatigue and was instructed to keep track of that to determine if beta-blocker should be decreased back down.  He reported continued persistent cough despite stopping losartan .  Chest x-ray was had no acute findings.  He was referred to pulmonology.  Patient was seen by pulmonology on 10/01/2023.  At that time he reported shortness of breath and wheezing particularly at night.  He felt this was worse since increasing carvedilol .  Dr. Malka contacted Dr. Darron who recommended decreasing carvedilol  back to 6.25 mg twice daily.     History of Present Illness    Today, patient is here alone. Patient denies shortness of breath, dyspnea on exertion, orthopnea or PND. He has chronic lower extremity edema that is not present in the morning and progresses throughout the day. He reports rare, brief twinges  of chest pain that are unchanged for years. No palpitations. He reports when he decreased carvedilol  back to 6.25 mg daily he restarted losartan  50 mg. He does not have home BP readings with him. He feels the wheezing he experiences at night has somewhat improved since decreasing carvedilol . His biggest complaint today is fatigue. If he is up and doing things he feels fine but as soon as he sits down he feels fatigued and will fall asleep. He has a high energy dog that he wants to play all of the time. He brings him to the dog park because he is unable to take long walks with him secondary to knee pain. He likes to use the recumbent stationary bike for exercise but feels the [place] [facility] [place] [facility]      ROS: All other systems reviewed and are otherwise negative except as noted in History of Present Illness.  EKGs/Labs Reviewed    EKG Interpretation Date/Time:  Friday December 05 2023 14:03:02 EDT Ventricular Rate:  63 PR  Interval:    QRS Duration:  104 QT Interval:  410 QTC Calculation: 419 R Axis:   4  Text Interpretation: Sinus rhythm with PACs Inferior infarct (cited on or before 21-Oct-2021) Cannot rule out Anterior infarct , age undetermined When compared with ECG of 12-May-2023 16:47, Previous ECG has undetermined rhythm, needs review Confirmed by Loistine Sober [phone]) on 12/05/2023 2:14:39 PM   05/12/2023: ALT 27; AST 38 11/19/2023: BUN 18; Creatinine, Ser 1.08; Potassium 4.1; Sodium 134   11/19/2023: Hemoglobin 11.9; WBC 5.7   12/27/2022: TSH 1.72   01/09/2023: Pro B Natriuretic peptide (BNP) 184.0    Physical Exam    VS:  BP 118/68 (Cuff Size: Normal)   Pulse 63   Ht 5' 5 (1.651 m)   Wt 166 lb (75.3 kg)   SpO2 94%   BMI 27.62 kg/m  , BMI Body mass index is 27.62 kg/m.  GEN: Well nourished, well developed, in no acute distress. Neck: No JVD or carotid bruits. Cardiac:  RRR.  No murmur. No rubs or gallops.   Respiratory:  Respirations regular and  unlabored. Clear to auscultation without rales, wheezing or rhonchi. GI: Soft, nontender, nondistended. Extremities: Radials/DP/PT 2+ and equal bilaterally. No clubbing or cyanosis. No edema.  Skin: Warm and dry, no rash. Neuro: Strength intact.  Assessment & Plan   CAD/atypical precordial chest pain S/p CABG x 7 2000 performed in [place], [place] .  PCI with DES to SVG to OM December 2019.  Patient reports rare, brief twinges of chest discomfort unchanged for years. He admits to not being as active as he should. He likes to use the recumbent stationary bike but [place] [facility] [place] [facility] [place]. He has a high energy dog that he brings to the dog park. He is unable to take long walks with the dog secondary to knee pain.  -Increase physical activity as tolerated.  -Continue aspirin , Plavix , amlodipine , atorvastatin , carvedilol , as needed SL NTG.   Carotid artery stenosis Carotid duplex 04/29/2023 showed right ICA 40 to 59%, left ICA 1 to 39% unchanged from prior imaging. No lightheadedness or dizziness reported. -Continue aspirin , Plavix , atorvastatin .   Thoracic aortic aneurysm CT abdomen pelvis 04/12/2023 showed ascending thoracic aortic aneurysm 4.3 cm.  Patient denies chest pain, abdominal pain, back pain, presyncope, syncope. - Continue yearly surveillance. - Will place order for CTA aorta to be performed sometime in December.    Hypertension BP today 118/68. No headaches or dizziness reported.  -Continue amlodipine , carvedilol .   Chronic diastolic heart failure Echo June 2023 showed EF 55 to 60%, mild LVH, Grade I DD, mildly elevated PA pressure, moderate LAE, mild MR.  Patient reports chronic lower extremity edema that is not present in the morning and progresses throughout the day. He has mild, nonpitting lower extremity edema otherwise euvolemic and well compensated on exam. -Continue carvedilol .   Fatigue Patient reports if he is up doing things he  feels fine but as soon as he sits down he feels like he will fall asleep. He is frustrated because he would ike to be more active.  - Decrease carvedilol  to 3.125 mg bid. Send in 1 week of BP and HR readings via MyChart.   Disposition: CTA aorta. Decrease carvedilol  to 3.125 mg bid. Send in 1 week of BP and HR readings via MyChart. Return in 6 months or sooner as needed.          Signed, Sober HERO. Teruo Stilley, DNP, NP-C

## 2023-12-04 ENCOUNTER — Ambulatory Visit: Admitting: Student

## 2023-12-05 ENCOUNTER — Ambulatory Visit: Attending: Student | Admitting: Student

## 2023-12-05 ENCOUNTER — Encounter: Payer: Self-pay | Admitting: Student

## 2023-12-05 VITALS — BP 118/68 | HR 63 | Ht 65.0 in | Wt 166.0 lb

## 2023-12-05 DIAGNOSIS — I739 Peripheral vascular disease, unspecified: Secondary | ICD-10-CM | POA: Diagnosis present

## 2023-12-05 DIAGNOSIS — I7121 Aneurysm of the ascending aorta, without rupture: Secondary | ICD-10-CM | POA: Diagnosis present

## 2023-12-05 DIAGNOSIS — I10 Essential (primary) hypertension: Secondary | ICD-10-CM | POA: Diagnosis present

## 2023-12-05 DIAGNOSIS — R5383 Other fatigue: Secondary | ICD-10-CM | POA: Insufficient documentation

## 2023-12-05 DIAGNOSIS — R0789 Other chest pain: Secondary | ICD-10-CM | POA: Diagnosis present

## 2023-12-05 DIAGNOSIS — I5032 Chronic diastolic (congestive) heart failure: Secondary | ICD-10-CM | POA: Insufficient documentation

## 2023-12-05 DIAGNOSIS — I25810 Atherosclerosis of coronary artery bypass graft(s) without angina pectoris: Secondary | ICD-10-CM | POA: Insufficient documentation

## 2023-12-05 MED ORDER — CARVEDILOL 3.125 MG PO TABS: 3.1250 mg | ORAL_TABLET | Freq: Two times a day (BID) | ORAL | 0 refills | Status: DC

## 2023-12-05 NOTE — Patient Instructions (Addendum)
 Medication Instructions:  Your physician recommends the following medication changes.  DECREASE: Carvedilol  to 3.125 mg twice daily  Please record BP and Heart rate 2 hr after taking BP meds and send results to Barnie Hila, NP, via MyChart  *If you need a refill on your cardiac medications before your next appointment, please call your pharmacy*  Lab Work: No labs ordered today  If you have labs (blood work) drawn today and your tests are completely normal, you will receive your results only by: MyChart Message (if you have MyChart) OR A paper copy in the mail If you have any lab test that is abnormal or we need to change your treatment, we will call you to review the results.  Testing/Procedures: This needs to be scheduled in December Chest CT scanning, (CAT scanning), is a noninvasive, special x-ray that produces cross-sectional images of the body using x-rays and a computer. CT scans help physicians diagnose and treat medical conditions. For some CT exams, a contrast material is used to enhance visibility in the area of the body being studied. CT scans provide greater clarity and reveal more details than regular x-ray exams.  [facility] [address]  [place], [place] [address]  Or  [facility]  [address]  [place], [place] [address]   Follow-Up: At [facility], you and your health needs are our priority.  As part of our continuing mission to provide you with exceptional heart care, our providers are all part of one team.  This team includes your primary Cardiologist (physician) and Advanced Practice Providers or APPs (Physician Assistants and Nurse Practitioners) who all work together to provide you with the care you need, when you need it.  Your next appointment:   6 month(s)  Provider:   Deatrice Cage, MD or Barnie Hila, NP

## 2023-12-15 LAB — HM DIABETES EYE EXAM

## 2023-12-18 ENCOUNTER — Encounter: Payer: Self-pay | Admitting: Nurse Practitioner

## 2023-12-22 ENCOUNTER — Ambulatory Visit: Admitting: Dermatology

## 2023-12-22 ENCOUNTER — Encounter: Payer: Self-pay | Admitting: Dermatology

## 2023-12-22 DIAGNOSIS — C4481 Basal cell carcinoma of overlapping sites of skin: Secondary | ICD-10-CM | POA: Diagnosis not present

## 2023-12-22 DIAGNOSIS — L821 Other seborrheic keratosis: Secondary | ICD-10-CM

## 2023-12-22 DIAGNOSIS — D492 Neoplasm of unspecified behavior of bone, soft tissue, and skin: Secondary | ICD-10-CM

## 2023-12-22 DIAGNOSIS — C44519 Basal cell carcinoma of skin of other part of trunk: Secondary | ICD-10-CM

## 2023-12-22 DIAGNOSIS — D229 Melanocytic nevi, unspecified: Secondary | ICD-10-CM

## 2023-12-22 DIAGNOSIS — L578 Other skin changes due to chronic exposure to nonionizing radiation: Secondary | ICD-10-CM

## 2023-12-22 DIAGNOSIS — C44619 Basal cell carcinoma of skin of left upper limb, including shoulder: Secondary | ICD-10-CM

## 2023-12-22 DIAGNOSIS — L814 Other melanin hyperpigmentation: Secondary | ICD-10-CM

## 2023-12-22 DIAGNOSIS — Z1283 Encounter for screening for malignant neoplasm of skin: Secondary | ICD-10-CM

## 2023-12-22 DIAGNOSIS — C44612 Basal cell carcinoma of skin of right upper limb, including shoulder: Secondary | ICD-10-CM | POA: Diagnosis not present

## 2023-12-22 DIAGNOSIS — D048 Carcinoma in situ of skin of other sites: Secondary | ICD-10-CM

## 2023-12-22 DIAGNOSIS — D099 Carcinoma in situ, unspecified: Secondary | ICD-10-CM

## 2023-12-22 DIAGNOSIS — W908XXA Exposure to other nonionizing radiation, initial encounter: Secondary | ICD-10-CM

## 2023-12-22 DIAGNOSIS — D485 Neoplasm of uncertain behavior of skin: Secondary | ICD-10-CM | POA: Diagnosis not present

## 2023-12-22 DIAGNOSIS — D1801 Hemangioma of skin and subcutaneous tissue: Secondary | ICD-10-CM

## 2023-12-22 NOTE — Patient Instructions (Addendum)
 Wound Care Instructions  Cleanse wound gently with soap and water once a day then pat dry with clean gauze. Apply a thin coat of Petrolatum (petroleum jelly, Vaseline) over the wound (unless you have an allergy to this). We recommend that you use a new, sterile tube of Vaseline. Do not pick or remove scabs. Do not remove the yellow or white healing tissue from the base of the wound.  Cover the wound with fresh, clean, nonstick gauze and secure with paper tape. You may use Band-Aids in place of gauze and tape if the wound is small enough, but would recommend trimming much of the tape off as there is often too much. Sometimes Band-Aids can irritate the skin.  You should call the office for your biopsy report after 1 week if you have not already been contacted.  If you experience any problems, such as abnormal amounts of bleeding, swelling, significant bruising, significant pain, or evidence of infection, please call the office immediately.  FOR ADULT SURGERY PATIENTS: If you need something for pain relief you may take 1 extra strength Tylenol  (acetaminophen ) AND 2 Ibuprofen (200mg  each) together every 4 hours as needed for pain. (do not take these if you are allergic to them or if you have a reason you should not take them.) Typically, you may only need pain medication for 1 to 3 days.        Recommend daily broad spectrum sunscreen SPF 30+ to sun-exposed areas, reapply every 2 hours as needed. Call for new or changing lesions.  Staying in the shade or wearing long sleeves, sun glasses (UVA+UVB protection) and wide brim hats (4-inch brim around the entire circumference of the hat) are also recommended for sun protection.     Melanoma ABCDEs  Melanoma is the most dangerous type of skin cancer, and is the leading cause of death from skin disease.  You are more likely to develop melanoma if you: Have light-colored skin, light-colored eyes, or red or blond hair Spend a lot of time in the  sun Tan regularly, either outdoors or in a tanning bed Have had blistering sunburns, especially during childhood Have a close family member who has had a melanoma Have atypical moles or large birthmarks  Early detection of melanoma is key since treatment is typically straightforward and cure rates are extremely high if we catch it early.   The first sign of melanoma is often a change in a mole or a new dark spot.  The ABCDE system is a way of remembering the signs of melanoma.  A for asymmetry:  The two halves do not match. B for border:  The edges of the growth are irregular. C for color:  A mixture of colors are present instead of an even brown color. D for diameter:  Melanomas are usually (but not always) greater than 6mm - the size of a pencil eraser. E for evolution:  The spot keeps changing in size, shape, and color.  Please check your skin once per month between visits. You can use a small mirror in front and a large mirror behind you to keep an eye on the back side or your body.   If you see any new or changing lesions before your next follow-up, please call to schedule a visit.  Please continue daily skin protection including broad spectrum sunscreen SPF 30+ to sun-exposed areas, reapplying every 2 hours as needed when you're outdoors.   Staying in the shade or wearing long sleeves, sun glasses (UVA+UVB  protection) and wide brim hats (4-inch brim around the entire circumference of the hat) are also recommended for sun protection.      Due to recent changes in healthcare laws, you may see results of your pathology and/or laboratory studies on MyChart before the doctors have had a chance to review them. We understand that in some cases there may be results that are confusing or concerning to you. Please understand that not all results are received at the same time and often the doctors may need to interpret multiple results in order to provide you with the best plan of care or course  of treatment. Therefore, we ask that you please give us  2 business days to thoroughly review all your results before contacting the office for clarification. Should we see a critical lab result, you will be contacted sooner.   If You Need Anything After Your Visit  If you have any questions or concerns for your doctor, please call our main line at [phone] and press option 4 to reach your doctor's medical assistant. If no one answers, please leave a voicemail as directed and we will return your call as soon as possible. Messages left after 4 pm will be answered the following business day.   You may also send us  a message via MyChart. We typically respond to MyChart messages within 1-2 business days.  For prescription refills, please ask your pharmacy to contact our office. Our fax number is [phone].  If you have an urgent issue when the clinic is closed that cannot wait until the next business day, you can page your doctor at the number below.    Please note that while we do our best to be available for urgent issues outside of office hours, we are not available 24/7.   If you have an urgent issue and are unable to reach us , you may choose to seek medical care at your doctor's office, retail clinic, urgent care center, or emergency room.  If you have a medical emergency, please immediately call 911 or go to the emergency department.  Pager Numbers  - Dr. Hester: [phone]  - Dr. Jackquline: [phone]  - Dr. Claudene: [phone]   - Dr. Raymund: [phone]  In the event of inclement weather, please call our main line at [phone] for an update on the status of any delays or closures.  Dermatology Medication Tips: Please keep the boxes that topical medications come in in order to help keep track of the instructions about where and how to use these. Pharmacies typically print the medication instructions only on the boxes and not directly on the medication tubes.   If  your medication is too expensive, please contact our office at [phone] option 4 or send us  a message through MyChart.   We are unable to tell what your co-pay for medications will be in advance as this is different depending on your insurance coverage. However, we may be able to find a substitute medication at lower cost or fill out paperwork to get insurance to cover a needed medication.   If a prior authorization is required to get your medication covered by your insurance company, please allow us  1-2 business days to complete this process.  Drug prices often vary depending on where the prescription is filled and some pharmacies may offer cheaper prices.  The website www.goodrx.com contains coupons for medications through different pharmacies. The prices here do not account for what the cost may be with help from insurance (it may  be cheaper with your insurance), but the website can give you the price if you did not use any insurance.  - You can print the associated coupon and take it with your prescription to the pharmacy.  - You may also stop by our office during regular business hours and pick up a GoodRx coupon card.  - If you need your prescription sent electronically to a different pharmacy, notify our office through [facility] or by phone at [phone] option 4.     Si Usted Necesita Algo Despus de Su Visita  Tambin puede enviarnos un mensaje a travs de Clinical cytogeneticist. Por lo general respondemos a los mensajes de MyChart en el transcurso de 1 a 2 das hbiles.  Para renovar recetas, por favor pida a su farmacia que se ponga en contacto con nuestra oficina. Randi lakes de fax es [place] [phone].  Si tiene un asunto urgente cuando la clnica est cerrada y que no puede esperar hasta el siguiente da hbil, puede llamar/localizar a su doctor(a) al nmero que aparece a continuacin.   Por favor, tenga en cuenta que aunque hacemos todo lo posible para estar disponibles para  asuntos urgentes fuera del horario de [place], no estamos disponibles las 24 horas del da, los 7 [address]  [address] de la [place].   Si tiene un problema urgente y no puede comunicarse con nosotros, puede optar por buscar atencin mdica  en el consultorio de su doctor(a), en una clnica privada, en un centro de atencin urgente o en una sala de emergencias.  Si tiene Engineer, drilling, por favor llame inmediatamente al 911 o vaya a la sala de emergencias.  Nmeros de bper  - Dr. Hester: [phone]  - Dra. Jackquline: [phone]  - Dr. Claudene: [phone]  - Dra. Kitts: [phone]  En caso de inclemencias del [place], por favor llame a nuestra lnea principal al [phone] para una actualizacin sobre el estado de cualquier retraso o cierre.  Consejos para la medicacin en dermatologa: Por favor, guarde las cajas en las que vienen los medicamentos de uso tpico para ayudarle a seguir las instrucciones sobre dnde y cmo usarlos. Las farmacias generalmente imprimen las instrucciones del medicamento slo en las cajas y no directamente en los tubos del [place].   Si su medicamento es muy caro, por favor, pngase en contacto con landry rieger llamando al [phone] y presione la opcin 4 o envenos un mensaje a travs de Clinical cytogeneticist.   No podemos decirle cul ser su copago por los medicamentos por adelantado ya que esto es diferente dependiendo de la cobertura de su seguro. Sin embargo, es posible que podamos encontrar un medicamento sustituto a Audiological scientist un formulario para que el seguro cubra el medicamento que se considera necesario.   Si se requiere una autorizacin previa para que su compaa de seguros malta su medicamento, por favor permtanos de 1 a 2 das hbiles para completar este proceso.  Los precios de los medicamentos varan con frecuencia dependiendo del Environmental consultant de dnde se surte la receta y alguna farmacias pueden ofrecer precios ms baratos.  El sitio web  www.goodrx.com tiene cupones para medicamentos de Health and safety inspector. Los precios aqu no tienen en cuenta lo que podra costar con la ayuda del seguro (puede ser ms barato con su seguro), pero el sitio web puede darle el precio si no utiliz Tourist information centre manager.  - Puede imprimir el cupn correspondiente y llevarlo con su receta a la farmacia.  - Tambin puede pasar por nuestra oficina durante el  horario de Visual merchandiser regular y Education officer, museum una tarjeta de cupones de GoodRx.  - Si necesita que su receta se enve electrnicamente a una farmacia diferente, informe a nuestra oficina a travs de MyChart de [place] o por telfono llamando al [phone] y presione la opcin 4.

## 2023-12-22 NOTE — Progress Notes (Signed)
 Follow-Up Visit   Subjective  Cory [name] is a 83 y.o. male who presents for the following: Skin Cancer Screening and Full Body Skin Exam. Hx of AKs. Hx BCCs. Hx SCCs. 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 Patient states he has not used 5FU/Calcipotriene cream yet.   Check L post leg near ankle.   The patient presents for Total-Body Skin Exam (TBSE) for skin cancer screening and mole check. The patient has spots, moles and lesions to be evaluated, some may be new or changing and the patient may have concern these could be cancer.    The following portions of the chart were reviewed this encounter and updated as appropriate: medications, allergies, medical history  Review of Systems:  No other skin or systemic complaints except as noted in HPI or Assessment and Plan.  Objective  Well appearing patient in no apparent distress; mood and affect are within normal limits.  A full examination was performed including scalp, head, eyes, ears, nose, lips, neck, chest, axillae, abdomen, back, buttocks, bilateral upper extremities, bilateral lower extremities, hands, feet, fingers, toes, fingernails, and toenails. All findings within normal limits unless otherwise noted below.   Relevant physical exam findings are noted in the Assessment and Plan.  Left Inframammary Fold 12 mm pink scaly plaque   Left Superior Shoulder 1 cm pink eroded patch  Right Shoulder - Posterior 1.8 cm pink scaly plaque  Left Lower Back 1.2 cm pink plaque with telangiectasias   Left Preauricular Area Pink scaly patch  Assessment & Plan   SKIN CANCER SCREENING PERFORMED TODAY.  HISTORY OF BASAL CELL CARCINOMA OF THE SKIN - No evidence of recurrence today - Recommend regular full body skin exams - Recommend daily broad spectrum sunscreen SPF 30+ to sun-exposed areas, reapply every 2 hours as needed.  - Call if any new or changing  lesions are noted between office visits   HISTORY OF SQUAMOUS CELL CARCINOMA OF THE SKIN - No evidence of recurrence today - No lymphadenopathy - Recommend regular full body skin exams - Recommend daily broad spectrum sunscreen SPF 30+ to sun-exposed areas, reapply every 2 hours as needed.  - Call if any new or changing lesions are noted between office visits   ACTINIC DAMAGE - Chronic condition, secondary to cumulative UV/sun exposure - diffuse scaly erythematous macules with underlying dyspigmentation - Recommend daily broad spectrum sunscreen SPF 30+ to sun-exposed areas, reapply every 2 hours as needed.  - Staying in the shade or wearing long sleeves, sun glasses (UVA+UVB protection) and wide brim hats (4-inch brim around the entire circumference of the hat) are also recommended for sun protection.  - Call for new or changing lesions.  LENTIGINES, SEBORRHEIC KERATOSES, HEMANGIOMAS - Benign normal skin lesions - Benign-appearing - Call for any changes  MELANOCYTIC NEVI - Tan-brown and/or pink-flesh-colored symmetric macules and papules - Benign appearing on exam today - Observation - Call clinic for new or changing moles - Recommend daily use of broad spectrum spf 30+ sunscreen to sun-exposed areas.    NEOPLASM OF SKIN (4) Left Inframammary Fold Skin / nail biopsy Type of biopsy: tangential   Informed consent: discussed and consent obtained   Timeout: patient name, date of birth, surgical site, and procedure verified   Procedure prep:  Patient was prepped and draped in usual sterile fashion Prep type:  Isopropyl alcohol Anesthesia: the lesion was anesthetized in a standard fashion   Anesthetic:  1% lidocaine  w/ epinephrine  1-100,000 buffered w/ 8.4% NaHCO3 Instrument used: DermaBlade   Hemostasis achieved with: pressure and aluminum chloride   Outcome: patient tolerated procedure well   Post-procedure details: sterile dressing applied and wound care instructions given    Dressing type: bandage and petrolatum    Specimen 1 - Surgical pathology Differential Diagnosis: ISK vs SCC  Check Margins: No Left Superior Shoulder Skin / nail biopsy Type of biopsy: tangential   Informed consent: discussed and consent obtained   Timeout: patient name, date of birth, surgical site, and procedure verified   Procedure prep:  Patient was prepped and draped in usual sterile fashion Prep type:  Isopropyl alcohol Anesthesia: the lesion was anesthetized in a standard fashion   Anesthetic:  1% lidocaine  w/ epinephrine  1-100,000 buffered w/ 8.4% NaHCO3 Instrument used: DermaBlade   Hemostasis achieved with: pressure and aluminum chloride   Outcome: patient tolerated procedure well   Post-procedure details: sterile dressing applied and wound care instructions given   Dressing type: bandage and petrolatum    Specimen 2 - Surgical pathology Differential Diagnosis: SCC vs BCC  Check Margins: No Right Shoulder - Posterior Skin / nail biopsy Type of biopsy: tangential   Informed consent: discussed and consent obtained   Timeout: patient name, date of birth, surgical site, and procedure verified   Procedure prep:  Patient was prepped and draped in usual sterile fashion Prep type:  Isopropyl alcohol Anesthesia: the lesion was anesthetized in a standard fashion   Anesthetic:  1% lidocaine  w/ epinephrine  1-100,000 buffered w/ 8.4% NaHCO3 Instrument used: DermaBlade   Hemostasis achieved with: pressure and aluminum chloride   Outcome: patient tolerated procedure well   Post-procedure details: sterile dressing applied and wound care instructions given   Dressing type: bandage and petrolatum    Specimen 3 - Surgical pathology Differential Diagnosis: SCC vs BCC  Check Margins: No Left Lower Back Skin / nail biopsy Type of biopsy: tangential   Informed consent: discussed and consent obtained   Timeout: patient name, date of birth, surgical site, and procedure verified    Procedure prep:  Patient was prepped and draped in usual sterile fashion Prep type:  Isopropyl alcohol Anesthesia: the lesion was anesthetized in a standard fashion   Anesthetic:  1% lidocaine  w/ epinephrine  1-100,000 buffered w/ 8.4% NaHCO3 Instrument used: DermaBlade   Hemostasis achieved with: pressure and aluminum chloride   Outcome: patient tolerated procedure well   Post-procedure details: sterile dressing applied and wound care instructions given   Dressing type: bandage and petrolatum    Specimen 4 - Surgical pathology Differential Diagnosis: BCC  Check Margins: No SQUAMOUS CELL CARCINOMA IN SITU (SCCIS) Left Preauricular Area Biopsy proven.  Plan to treat with EDC at next visit. Patient requested 5FU/Calcipotriene cream treatment but did not use it MULTIPLE BENIGN NEVI   LENTIGINES   ACTINIC ELASTOSIS   SEBORRHEIC KERATOSES   CHERRY ANGIOMA   Return in about 6 months (around 06/23/2024) for TBSE, HxSCC, HxBCC.  I, Jill Parcell, CMA, am acting as scribe for Boneta Sharps, MD.   Documentation: I have reviewed the above documentation for accuracy and completeness, and I agree with the above.  Boneta Sharps, MD

## 2023-12-24 ENCOUNTER — Ambulatory Visit: Payer: Self-pay | Admitting: Dermatology

## 2023-12-24 LAB — SURGICAL PATHOLOGY

## 2023-12-25 ENCOUNTER — Encounter: Payer: Self-pay | Admitting: Dermatology

## 2023-12-25 NOTE — Telephone Encounter (Signed)
-----   Message from [facility] sent at 12/24/2023 10:07 PM EDT ----- Diagnosis: 1. Skin, left inframammary fold :       BASAL CELL CARCINOMA, SUPERFICIAL AND NODULAR PATTERNS        2. Skin, left superior shoulder :       BASAL CELL CARCINOMA, SUPERFICIAL AND NODULAR PATTERNS        3. Skin, right shoulder - posterior :       SUPERFICIAL BASAL CELL CARCINOMA        4. Skin, left lower back :       BASAL CELL CARCINOMA, SUPERFICIAL AND NODULAR PATTERNS   Please call to share diagnosis and discuss treatment options.   L inframammary Biopsy shows a basal cell skin cancer in the top and second layers of skin. This is the most common kind of skin cancer and is caused by damage from sun exposure. Basal cell skin cancers almost never  spread beyond the skin, so they are not dangerous to your overall health. However, they will continue to grow, can bleed, cause nonhealing wounds, and disrupt nearby structures unless fully treated.  Treatment: you return for an hour long appointment where we perform a skin surgery. We numb the site of the skin cancer and a safety margin of normal skin around it. We remove the full thickness of  skin and close the wound with two layers of stitches. The sample is sent to the lab to check that the skin cancer was fully removed. Return one week later to have wound checked and surface stitches  removed. Surgical wound leaves a line scar. Approximately 95% cure rate  2. L superior shoulder BCC in the top and second layers of skin Excision  3. R SHOULDER  BCC in top layer of skin  Treatment option 1: you return for a brief appointment where I perform electrodesiccation and curettage (ED&C). This involves three rounds of scraping and burning to destroy the skin cancer. It has  about an 85% cure rate and leaves a round wound slightly larger than the skin cancer and leaves a round white scar. No additional pathology is done. If the skin cancer comes back, we would  need to do  a surgery to remove it.   Treatment option 2: imiquimod is a cream that helps your immune system clear the skin cancer. You will apply the cream on weekdays for 6 weeks. It has an 88% cure rate. If redness and irritation  develop, take 3 days off before restarting. If an open sore develops, stop the cream and send us  a message on MyChart or call us .  If the cream does not clear the cancer, surgery will be required.  ##please make 2 month follow up for recheck  For option 2: Free text instructions and copy paste: apply cream to your skin cancer once daily on 5 days per week, prior to normal sleeping hours, for 6 weeks. Leave it on your skin for ~8 hours,  then remove with mild soap and water. Apply enough cream to cover the treatment area and a quarter inch of skin surrounding the tumor. If redness and irritation develop, take 3 days off then  restart. Prescribe 12 packets with 2 refills.  4. L lower back BCC in top and second layers of skin Excision ----- Message ----- From: Interface, Lab In [facility] Sent: 12/24/2023   6:19 PM EDT To: Boneta Sharps, MD

## 2023-12-25 NOTE — Telephone Encounter (Signed)
 Advised pt of bx results and recommended treatment/options.  Scheduled pt for [facility] of superficial BCC of R shoulder 01/06/24.  Patient wanted to schedule the 3 surgeries in Feburary of 2026.  Scheduled pt on 06/02/2024, 06/09/24, and 06/16/2024 for the 3 BCCs that need excised./sh

## 2023-12-26 NOTE — Telephone Encounter (Signed)
 Patient BP reading are placed in nurse box.

## 2023-12-31 ENCOUNTER — Telehealth: Payer: Self-pay | Admitting: Emergency Medicine

## 2023-12-31 NOTE — Telephone Encounter (Signed)
 Called and left a voicemail for the patient to call back (per DPR) regarding the cough and blood pressure.

## 2024-01-01 ENCOUNTER — Telehealth: Payer: Self-pay | Admitting: Nurse Practitioner

## 2024-01-01 ENCOUNTER — Telehealth: Payer: Self-pay | Admitting: Emergency Medicine

## 2024-01-01 NOTE — Telephone Encounter (Signed)
 Copied from CRM [id number]. Topic: General - Other >> Jan 01, 2024  3:00 PM Paige D wrote: Reason for CRM: pt calling in regards to getting a call from [place]. Checked notes I did not see any notes from a telephone call from miss brenda. Please reach out to pt in regards to this.

## 2024-01-01 NOTE — Telephone Encounter (Signed)
 Received a call from the patient stating he was returning my call.  Patient reports last blood pressure as 155/82 with HR of 58.  Patient states taking Carvedilol  6.25 mg twice daily.  Patient also states taking Losartan , stating his PCP Adina Crandall is handling that.  Patient has not yet been evaluated by Pulmonology.  He states having an appointment with them this coming week.  Patient asked to call back after the appointment to report more numbers and the findings from the Pulmonologist.  Patient verbalized understanding with all questions and concerns addressed at this time.

## 2024-01-05 ENCOUNTER — Ambulatory Visit (INDEPENDENT_AMBULATORY_CARE_PROVIDER_SITE_OTHER): Admitting: Podiatry

## 2024-01-05 ENCOUNTER — Encounter: Payer: Self-pay | Admitting: Podiatry

## 2024-01-05 DIAGNOSIS — M79674 Pain in right toe(s): Secondary | ICD-10-CM

## 2024-01-05 DIAGNOSIS — B351 Tinea unguium: Secondary | ICD-10-CM | POA: Diagnosis not present

## 2024-01-05 DIAGNOSIS — M79675 Pain in left toe(s): Secondary | ICD-10-CM

## 2024-01-05 DIAGNOSIS — E1151 Type 2 diabetes mellitus with diabetic peripheral angiopathy without gangrene: Secondary | ICD-10-CM | POA: Diagnosis not present

## 2024-01-06 ENCOUNTER — Ambulatory Visit (INDEPENDENT_AMBULATORY_CARE_PROVIDER_SITE_OTHER): Admitting: Dermatology

## 2024-01-06 ENCOUNTER — Encounter: Payer: Self-pay | Admitting: Dermatology

## 2024-01-06 DIAGNOSIS — W908XXA Exposure to other nonionizing radiation, initial encounter: Secondary | ICD-10-CM

## 2024-01-06 DIAGNOSIS — C44611 Basal cell carcinoma of skin of unspecified upper limb, including shoulder: Secondary | ICD-10-CM

## 2024-01-06 DIAGNOSIS — Z86007 Personal history of in-situ neoplasm of skin: Secondary | ICD-10-CM | POA: Diagnosis not present

## 2024-01-06 DIAGNOSIS — C44612 Basal cell carcinoma of skin of right upper limb, including shoulder: Secondary | ICD-10-CM

## 2024-01-06 DIAGNOSIS — L570 Actinic keratosis: Secondary | ICD-10-CM | POA: Diagnosis not present

## 2024-01-06 DIAGNOSIS — Z08 Encounter for follow-up examination after completed treatment for malignant neoplasm: Secondary | ICD-10-CM

## 2024-01-06 NOTE — Progress Notes (Signed)
 Follow-Up Visit   Subjective  Cory [name] is a 83 y.o. male who presents for the following: EDC at right shoulder for bx proven BCC   The following portions of the chart were reviewed this encounter and updated as appropriate: medications, allergies, medical history  Review of Systems:  No other skin or systemic complaints except as noted in HPI or Assessment and Plan.  Objective  Well appearing patient in no apparent distress; mood and affect are within normal limits.   A focused examination was performed of the following areas: Right shoulder  Relevant exam findings are noted in the Assessment and Plan.  Right Shoulder Pink bx site L ear anterior to left helical insertion x 1, L tragus x 1, L neck x 1 (3) Erythematous thin papules/macules with gritty scale.   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, UNSPECIFIED LATERALITY Right Shoulder Destruction of lesion Complexity: simple   Destruction method: electrodesiccation and curettage   Informed consent: discussed and consent obtained   Timeout:  patient name, date of birth, surgical site, and procedure verified Procedure prep:  Patient was prepped and draped in usual sterile fashion Prep type:  Isopropyl alcohol Anesthesia: the lesion was anesthetized in a standard fashion   Anesthetic:  1% lidocaine  w/ epinephrine  1-100,000 local infiltration Curettage performed in three different directions: Yes   Electrodesiccation performed over the curetted area: Yes   Curettage cycles:   3 Final wound size (cm):  2.2 Hemostasis achieved with:  aluminum chloride and electrodesiccation Outcome: patient tolerated procedure well with no complications   Post-procedure details: sterile dressing applied and wound care instructions given   Dressing type: bandage and petrolatum    AK (ACTINIC KERATOSIS) (3) L ear anterior to left helical insertion x 1, L tragus x 1, L neck x 1 (3) Actinic keratoses are precancerous spots that appear secondary to cumulative UV radiation exposure/sun exposure over time. They are chronic with expected duration over 1 year. A portion of actinic keratoses will progress to squamous cell carcinoma of the skin. It is not possible to reliably predict which spots will progress to skin cancer and so treatment is recommended to prevent development of skin cancer.  Recommend daily broad spectrum sunscreen SPF 30+ to sun-exposed areas, reapply every 2 hours as needed.  Recommend staying in the shade or wearing long sleeves, sun glasses (UVA+UVB protection) and wide brim hats (4-inch brim around the entire circumference of the hat). Call for new or changing lesions. Destruction of lesion - L ear anterior to left helical insertion x 1, L tragus x 1, L neck x 1 (3) Complexity: simple   Destruction method: cryotherapy   Informed consent: discussed and consent obtained   Timeout:  patient name, date of birth, surgical site, and procedure verified Lesion destroyed using liquid nitrogen: Yes   Region frozen until ice ball extended beyond lesion: Yes   Cryo cycles: 1 or 2. Outcome: patient tolerated procedure well with no complications   Post-procedure details: wound care instructions given     Return in  about 4 months (around 05/17/2024) for TBSE, with Dr. Claudene, HxAK, HxBCC, HxSCC.  LILLETTE Lonell Drones, RMA, am acting as scribe for Boneta Claudene, MD .   Documentation: I have reviewed the above documentation for accuracy and completeness, and I agree with the  above.  Boneta Claudene, MD

## 2024-01-06 NOTE — Patient Instructions (Signed)

## 2024-01-08 ENCOUNTER — Encounter: Payer: Self-pay | Admitting: Pulmonary Disease

## 2024-01-08 ENCOUNTER — Ambulatory Visit: Admitting: Pulmonary Disease

## 2024-01-08 VITALS — BP 128/60 | HR 55 | Temp 97.5°F | Ht 65.0 in | Wt 166.4 lb

## 2024-01-08 DIAGNOSIS — R0602 Shortness of breath: Secondary | ICD-10-CM | POA: Diagnosis not present

## 2024-01-08 DIAGNOSIS — Z87891 Personal history of nicotine dependence: Secondary | ICD-10-CM | POA: Diagnosis not present

## 2024-01-08 LAB — PULMONARY FUNCTION TEST
DL/VA % pred: 74 %
DL/VA: 2.87 ml/min/mmHg/L
DLCO unc % pred: 62 %
DLCO unc: 12.54 ml/min/mmHg
FEF 25-75 Post: 1.67 L/s
FEF 25-75 Pre: 1.72 L/s
FEF2575-%Change-Post: -3 %
FEF2575-%Pred-Post: 126 %
FEF2575-%Pred-Pre: 130 %
FEV1-%Change-Post: -1 %
FEV1-%Pred-Post: 99 %
FEV1-%Pred-Pre: 100 %
FEV1-Post: 2.08 L
FEV1-Pre: 2.11 L
FEV1FVC-%Change-Post: 0 %
FEV1FVC-%Pred-Pre: 108 %
FEV6-%Change-Post: 0 %
FEV6-%Pred-Post: 97 %
FEV6-%Pred-Pre: 97 %
FEV6-Post: 2.72 L
FEV6-Pre: 2.72 L
FEV6FVC-%Change-Post: 0 %
FEV6FVC-%Pred-Post: 108 %
FEV6FVC-%Pred-Pre: 107 %
FVC-%Change-Post: 0 %
FVC-%Pred-Post: 89 %
FVC-%Pred-Pre: 90 %
FVC-Post: 2.73 L
FVC-Pre: 2.75 L
Post FEV1/FVC ratio: 76 %
Post FEV6/FVC ratio: 100 %
Pre FEV1/FVC ratio: 77 %
Pre FEV6/FVC Ratio: 99 %
RV % pred: 83 %
RV: 2.07 L
TLC % pred: 79 %
TLC: 4.84 L

## 2024-01-08 MED ORDER — TRELEGY ELLIPTA 100-62.5-25 MCG/ACT IN AEPB
1.0000 | INHALATION_SPRAY | Freq: Every day | RESPIRATORY_TRACT | 0 refills | Status: DC
Start: 2024-01-08 — End: 2024-03-12

## 2024-01-08 NOTE — Progress Notes (Signed)
  Subjective:  Patient ID: Cory [name], male    DOB: [birth year],  MRN: [id number]  Cory LELON Burnetta [name] presents to clinic today for at risk foot care. Pt has h/o NIDDM with PAD and painful thick toenails that are difficult to trim. Pain interferes with ambulation. Aggravating factors include wearing enclosed shoe gear. Pain is relieved with periodic professional debridement.  Chief Complaint  Patient presents with   Nail Problem    RFC   New problem(s): None.   PCP is Wendee Cory HERO, NP. ARNETTA 11/19/2023.  Allergies  Allergen Reactions   Fesoterodine Other (See Comments)    Dry mouth and constipation   Isosorbide Other (See Comments)   Other Other (See Comments)   Grass Pollen(K-O-R-T-Swt Vern) Other (See Comments)   Hydrocodone Anxiety   Meperidine Other (See Comments) and Rash    Other reaction(s): Other (See Comments) HALLUCINATIONS    Penicillins Rash    Review of Systems: Negative except as noted in the HPI.  Objective: No changes noted in today's physical examination. There were no vitals filed for this visit. Cory [name] is a pleasant 83 y.o. male in NAD. AAO x 3.  Vascular Examination: CFT <4 seconds b/l. DP pulses diminished b/l. PT pulses diminished b/l. Digital hair absent. Skin temperature gradient warm to cool b/l. No ischemia or gangrene. No cyanosis or clubbing noted b/l. Trace edema noted BLE.   Neurological Examination: Protective sensation diminished with 10g monofilament b/l.  Dermatological Examination: Pedal skin thin, shiny and atrophic b/l. No open wounds. No interdigital macerations.   Toenails 1-5 b/l thick, discolored, elongated with subungual debris and pain on dorsal palpation.   No hyperkeratotic nor porokeratotic lesions present on today's visit.  Musculoskeletal Examination: Muscle strength 5/5 to all LE muscle groups of left lower extremity. Decreased dorsiflexion RLE. Pes planus deformity noted bilateral LE. Utilizes cane for  ambulation assistance.  Radiographs: None  Assessment/Plan: 1. Pain due to onychomycosis of toenails of both feet   2. Type II diabetes mellitus with peripheral circulatory disorder [facility])   Patient was evaluated and treated. All patient's and/or POA's questions/concerns addressed on today's visit. Toenails 1-5 debrided in length and girth without incident. Continue foot and shoe inspections daily. Monitor blood glucose per PCP/Endocrinologist's recommendations. Continue soft, supportive shoe gear daily. Report any pedal injuries to medical professional. Call office if there are any questions/concerns. -Patient/POA to call should there be question/concern in the interim.   Return in about 3 months (around 04/05/2024).  Delon LITTIE Merlin, DPM      [place] LOCATION: [address] [place] [address]                   Office [phone]   [facility] LOCATION: [address] [place], [place] [address] Office [phone]

## 2024-01-08 NOTE — Patient Instructions (Signed)
 Full PFT completed today ? ?

## 2024-01-08 NOTE — Progress Notes (Signed)
 Full PFT completed today ? ?

## 2024-01-08 NOTE — Progress Notes (Signed)
 Synopsis: Referred in by Malka Domino, MD   Subjective:   PATIENT ID: Cory [name] DOUGLAS GENDER: male DOB: 01-25-1941, MRN: [id number]  Chief Complaint  Patient presents with   Shortness of Breath    Breathing is worse at night. Occasional wheezing. Dry cough.    HPI Mr. [name] is a pleasant 83 years old male patient with a past medical history of hypertension, hyperlipidemia, anxiety, BPH, remote CAD s/p CABG, BCC s/p Mohs 06/2023, hx of IDA presenting today to the pulmonary clinic for further evaluation of shortness of breath.    He reports that he has been having shortness of breath associated with exhaustion and fatigue for few weeks now. He specifically noticed this when his carvedilol  was increased to 12.5 from 6.25 mg BID. He does report some chest tightness and wheezing mostly at night.   He denies any history of pulmonary diseases. He was never a heavy smoker or regular smoker. He smoked pipe in the past and then  cigars occasionally.   CXR 07/2023 - No acute findings.   Echocardiogram 09/2021 - LVEF 55 to 60% with no RWMA, grade I diastolic dysfunction. RV function is normal, estimated RVSP is 41.4 mmHg. No valvular disease.    Family history: He denies any family history of lung diseases.   Social history:  Smoked pipe in the past - occasional cigars smoking. He is a retired Art gallery manager. He has 1 dog at home.   OV 01/08/2024 - [name] is here to follow up on his PFT results that show reduced DLCO suggesting PVD. He is still complaining of Shortness of breath on exertion and fatigue. His Echo in 2023 shows an RVSP of 41 mmHg. I will repeat an echocardiogram and sleep study. If the echo shows increased PASP or RVSP we will discuss pursuing a RHC. I will also trial an ICS-LABA and assess if that can bring him some relief.   ROS All systems were reviewed and are negative except for the above.  Objective:   Vitals:   01/08/24 1344  BP: 128/60  Pulse: (!) 55   Temp: (!) 97.5 F (36.4 C)  SpO2: 98%  Weight: 166 lb 6.4 oz (75.5 kg)  Height: 5' 5 (1.651 m)   98% on RA BMI Readings from Last 3 Encounters:  01/08/24 27.69 kg/m  01/08/24 27.69 kg/m  12/05/23 27.62 kg/m   Wt Readings from Last 3 Encounters:  01/08/24 166 lb 6.4 oz (75.5 kg)  01/08/24 166 lb 6.4 oz (75.5 kg)  12/05/23 166 lb (75.3 kg)    Physical Exam GEN: NAD, Healthy Appearing HEENT: Supple Neck, Reactive Pupils, EOMI  CVS: Normal S1, Normal S2, RRR, No murmurs or ES appreciated  Lungs: Clear bilateral air entry.  Abdomen: Soft, non tender, non distended, + BS  Extremities: Warm and well perfused, No edema  Skin: No suspicious lesions appreciated  Psych: Normal Affect  Ancillary Information   CBC    Component Value Date/Time   WBC 5.7 11/19/2023 1512   RBC 3.96 (L) 11/19/2023 1512   HGB 11.9 (L) 11/19/2023 1512   HGB 12.1 (L) 03/13/2020 1123   HCT 36.6 (L) 11/19/2023 1512   HCT 37.0 (L) 03/13/2020 1123   PLT 174.0 11/19/2023 1512   PLT 216 03/13/2020 1123   MCV 92.4 11/19/2023 1512   MCV 93 03/13/2020 1123   MCH 30.2 09/19/2023 1100   MCHC 32.4 11/19/2023 1512   RDW 13.0 11/19/2023 1512   RDW 12.3 03/13/2020 1123  LYMPHSABS 1.2 09/19/2023 1100   LYMPHSABS 1.0 03/13/2020 1123   MONOABS 0.6 09/19/2023 1100   EOSABS 0.3 09/19/2023 1100   EOSABS 0.1 03/13/2020 1123   BASOSABS 0.1 09/19/2023 1100   BASOSABS 0.0 03/13/2020 1123   Labs and imaging were reviewed.     Latest Ref Rng & Units 01/08/2024    1:00 PM  PFT Results  FVC-Pre L 2.75  P  FVC-Predicted Pre % 90  P  FVC-Post L 2.73  P  FVC-Predicted Post % 89  P  Pre FEV1/FVC % % 77  P  Post FEV1/FCV % % 76  P  FEV1-Pre L 2.11  P  FEV1-Predicted Pre % 100  P  FEV1-Post L 2.08  P  DLCO uncorrected ml/min/mmHg 12.54  P  DLCO UNC% % 62  P  DLVA Predicted % 74  P  TLC L 4.84  P  TLC % Predicted % 79  P  RV % Predicted % 83  P    P Preliminary result     Assessment & Plan:  Mr. Camilo  is a pleasant 83 years old male patient with a past medical history of hypertension, hyperlipidemia, anxiety, BPH, remote CAD s/p CABG, BCC s/p Mohs 06/2023, hx of IDA presenting today to the pulmonary clinic for further evaluation of shortness of breath.    #Shortness of breath and exhaustion  Unclear reason so far. Does not appear to be obstructive lung disease. PFTs suggestive of PVD. Echocardiogram in 2023 with RVSP . 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 I will repeat an echocardiogram and sleep study. If the echo shows increased PASP or RVSP we will discuss pursuing a RHC. I will also trial an ICS-LABA and assess if that can bring him some relief.   RTC 3 months.   I spent 40 minutes caring for this patient today, including preparing to see the patient, obtaining a medical history , reviewing a separately obtained history, performing a medically appropriate examination and/or evaluation, counseling and educating the patient/family/caregiver, ordering medications, tests, or procedures, documenting clinical information in the electronic health record, and independently interpreting results (not separately reported/billed) and communicating results to the patient/family/caregiver  Darrin Barn, MD [facility] 01/08/2024 1:46 PM

## 2024-01-14 ENCOUNTER — Encounter: Payer: Self-pay | Admitting: Cardiovascular Disease

## 2024-01-20 ENCOUNTER — Inpatient Hospital Stay: Attending: Oncology

## 2024-01-20 ENCOUNTER — Other Ambulatory Visit

## 2024-01-20 DIAGNOSIS — Z79899 Other long term (current) drug therapy: Secondary | ICD-10-CM | POA: Insufficient documentation

## 2024-01-20 DIAGNOSIS — D509 Iron deficiency anemia, unspecified: Secondary | ICD-10-CM | POA: Insufficient documentation

## 2024-01-20 LAB — CBC WITH DIFFERENTIAL/PLATELET
Abs Immature Granulocytes: 0.02 K/uL (ref 0.00–0.07)
Basophils Absolute: 0.1 K/uL (ref 0.0–0.1)
Basophils Relative: 1 %
Eosinophils Absolute: 0.3 K/uL (ref 0.0–0.5)
Eosinophils Relative: 5 %
HCT: 34.5 % — ABNORMAL LOW (ref 39.0–52.0)
Hemoglobin: 11.2 g/dL — ABNORMAL LOW (ref 13.0–17.0)
Immature Granulocytes: 0 %
Lymphocytes Relative: 25 %
Lymphs Abs: 1.4 K/uL (ref 0.7–4.0)
MCH: 29.9 pg (ref 26.0–34.0)
MCHC: 32.5 g/dL (ref 30.0–36.0)
MCV: 92 fL (ref 80.0–100.0)
Monocytes Absolute: 0.7 K/uL (ref 0.1–1.0)
Monocytes Relative: 13 %
Neutro Abs: 3.2 K/uL (ref 1.7–7.7)
Neutrophils Relative %: 56 %
Platelets: 169 K/uL (ref 150–400)
RBC: 3.75 MIL/uL — ABNORMAL LOW (ref 4.22–5.81)
RDW: 13 % (ref 11.5–15.5)
WBC: 5.6 K/uL (ref 4.0–10.5)
nRBC: 0 % (ref 0.0–0.2)

## 2024-01-20 LAB — IRON AND TIBC
Iron: 53 ug/dL (ref 45–182)
Saturation Ratios: 15 % — ABNORMAL LOW (ref 17.9–39.5)
TIBC: 356 ug/dL (ref 250–450)
UIBC: 303 ug/dL

## 2024-01-20 LAB — FERRITIN: Ferritin: 10 ng/mL — ABNORMAL LOW (ref 24–336)

## 2024-01-21 ENCOUNTER — Ambulatory Visit
Admission: RE | Admit: 2024-01-21 | Discharge: 2024-01-21 | Disposition: A | Discharge status: Home / Self Care | Source: Ambulatory Visit | Attending: Pulmonary Disease | Admitting: Pulmonary Disease

## 2024-01-21 DIAGNOSIS — I371 Nonrheumatic pulmonary valve insufficiency: Secondary | ICD-10-CM | POA: Diagnosis not present

## 2024-01-21 DIAGNOSIS — I083 Combined rheumatic disorders of mitral, aortic and tricuspid valves: Secondary | ICD-10-CM | POA: Diagnosis not present

## 2024-01-21 DIAGNOSIS — I119 Hypertensive heart disease without heart failure: Secondary | ICD-10-CM | POA: Insufficient documentation

## 2024-01-21 DIAGNOSIS — R0602 Shortness of breath: Secondary | ICD-10-CM | POA: Diagnosis present

## 2024-01-21 DIAGNOSIS — R0600 Dyspnea, unspecified: Secondary | ICD-10-CM | POA: Diagnosis not present

## 2024-01-21 DIAGNOSIS — I77810 Thoracic aortic ectasia: Secondary | ICD-10-CM | POA: Insufficient documentation

## 2024-01-21 LAB — ECHOCARDIOGRAM COMPLETE
Area-P 1/2: 2.52 cm2
MV VTI: 1.05 cm2
S' Lateral: 3.2 cm
Single Plane A2C EF: 45.2 %

## 2024-01-21 NOTE — Progress Notes (Signed)
  Echocardiogram 2D Echocardiogram has been performed.  Cory [name] 01/21/2024, 1:25 PM

## 2024-01-22 ENCOUNTER — Inpatient Hospital Stay (HOSPITAL_BASED_OUTPATIENT_CLINIC_OR_DEPARTMENT_OTHER): Admitting: Oncology

## 2024-01-22 ENCOUNTER — Inpatient Hospital Stay

## 2024-01-22 ENCOUNTER — Encounter: Payer: Self-pay | Admitting: Oncology

## 2024-01-22 VITALS — BP 140/78 | HR 56 | Temp 97.7°F | Resp 18 | Ht 65.0 in | Wt 167.0 lb

## 2024-01-22 VITALS — BP 156/70 | HR 53

## 2024-01-22 DIAGNOSIS — D509 Iron deficiency anemia, unspecified: Secondary | ICD-10-CM

## 2024-01-22 MED ORDER — IRON SUCROSE 20 MG/ML IV SOLN
200.0000 mg | Freq: Once | INTRAVENOUS | Status: AC
  Administered 2024-01-22: 200 mg via INTRAVENOUS
  Filled 2024-01-22: qty 10

## 2024-01-22 NOTE — Progress Notes (Signed)
 Patient is having fatigue.

## 2024-01-22 NOTE — Patient Instructions (Signed)

## 2024-01-22 NOTE — Progress Notes (Signed)
 [facility]  [phone] [phone]  ID: Cory [name] OB: December 24, 1940  MR#: [id number]  RDW#:[id number]  Patient Care Team: Wendee Cory HERO, NP as PCP - General (Nurse Practitioner) Darron Deatrice LABOR, MD as PCP - Cardiology (Cardiology) Penne Knee, MD (Inactive) as Consulting Physician (Urology) Fate Morna SAILOR, [facility] (Inactive) as Pharmacist (Pharmacist) Mittie Gaskin, MD as Referring Physician (Ophthalmology) Jacobo Evalene PARAS, MD as Consulting Physician (Oncology)  CHIEF COMPLAINT: Iron  deficiency anemia.  INTERVAL HISTORY: Patient returns to clinic today for repeat laboratory, further evaluation, and consideration of additional IV iron .  He has increased fatigue, but otherwise feels well.  He reports his pulmonary doctor recommended a sleep study, but he is not scheduling it at this time.  He has no neurologic complaints.  He has a good appetite and denies weight loss.  He has no chest pain, shortness of breath, cough, or hemoptysis.  He denies any nausea, vomiting, constipation, or diarrhea.  He has no melena or hematochezia.  He has no urinary complaints.  Patient offers no further specific complaints today.  REVIEW OF SYSTEMS:   Review of Systems  Constitutional:  Positive for malaise/fatigue. Negative for fever and weight loss.  Respiratory: Negative.  Negative for cough, hemoptysis and shortness of breath.   Cardiovascular: Negative.  Negative for chest pain and leg swelling.  Gastrointestinal: Negative.  Negative for abdominal pain, blood in stool and melena.  Genitourinary: Negative.  Negative for hematuria.  Musculoskeletal: Negative.  Negative for back pain and joint pain.  Skin: Negative.  Negative for rash.  Neurological: Negative.  Negative for dizziness, focal weakness, weakness and headaches.  Psychiatric/Behavioral: Negative.  The patient is not nervous/anxious.     As per HPI. Otherwise, a complete review  of systems is negative.  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 Nodular and infiltrative. Mohs pending.   Basal cell carcinoma 12/22/2023   Left Inframammary Fold, pt scheduled for excision   Basal cell carcinoma 12/22/2023   Left Superior Shoulder, scheduled for surgery   Basal cell carcinoma 12/22/2023   Right Shoulder - Posterior, EDC 01/06/24   Basal cell carcinoma 12/22/2023   Left Lower Back, pt schedule for excision   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 Clinically clear with biopsy, monitor for recurrence.   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   Left scalp - EDC   Stroke (HCC)    on Plavix  and ASA   Vasovagal syncope     PAST SURGICAL HISTORY: Past Surgical History:  Procedure Laterality Date   CHOLECYSTECTOMY     CORONARY ARTERY BYPASS GRAFT  2000   DEBRIDEMENT AND CLOSURE WOUND N/A 01/19/2021   Procedure: Closure of scalp defect;  Surgeon: Elisabeth Craig RAMAN, MD;  Location: [facility];  Service: Plastics;  Laterality: N/A;  MOHS SURGERY     NECK SURGERY      FAMILY HISTORY: Family History  Problem Relation Age of Onset   Heart Problems Mother    Heart disease Mother    Heart Problems Father    Heart attack Father    Heart disease Brother     ADVANCED DIRECTIVES (Y/N):  N  HEALTH MAINTENANCE: Social History   Tobacco Use   Smoking status:  Former    Types: Pipe   Smokeless tobacco: Never  Vaping Use   Vaping status: Never Used  Substance Use Topics   Alcohol use: Not Currently   Drug use: Never     Colonoscopy:  PAP:  Bone density:  Lipid panel:  Allergies  Allergen Reactions   Fesoterodine Other (See Comments)    Dry mouth and constipation   Isosorbide Other (See Comments)   Other Other (See Comments)   Grass Pollen(K-O-R-T-Swt Vern) Other (See Comments)   Hydrocodone Anxiety   Meperidine Other (See Comments) and Rash    Other reaction(s): Other (See Comments) HALLUCINATIONS    Penicillins Rash    Current Outpatient Medications  Medication Sig Dispense Refill   acetaminophen  (TYLENOL ) 500 MG tablet Take 1,000 mg by mouth at bedtime.     albuterol  (VENTOLIN  HFA) 108 (90 Base) MCG/ACT inhaler Inhale 2 puffs into the lungs every 6 (six) hours as needed for wheezing or shortness of breath. 1 each 4   amLODipine  (NORVASC ) 5 MG tablet TAKE 1 TABLET EVERY DAY 90 tablet 3   aspirin  EC 81 MG tablet Take 1 tablet (81 mg total) by mouth daily. Swallow whole. 90 tablet 3   atorvastatin  (LIPITOR) 40 MG tablet Take 1 tablet (40 mg total) by mouth daily. 90 tablet 3   carvedilol  (COREG ) 6.25 MG tablet Take 1 tablet (6.25 mg total) by mouth 2 (two) times daily with a meal. 180 tablet 1   clopidogrel  (PLAVIX ) 75 MG tablet Take 1 tablet (75 mg total) by mouth daily. 90 tablet 3   diclofenac Sodium (VOLTAREN) 1 % GEL Apply 2 g topically as needed.     dorzolamide-timolol (COSOPT) 2-0.5 % ophthalmic solution Place 1 drop into both eyes 2 (two) times daily.     famotidine  (PEPCID ) 20 MG tablet Take 20 mg by mouth at bedtime.     fexofenadine  (ALLEGRA  ALLERGY) 180 MG tablet Take 1 tablet (180 mg total) by mouth daily. 30 tablet 1   fluorouracil  (EFUDEX ) 5 % cream Wait two weeks after the biopsy before starting the cream. Apply the cream twice per day to the area where the skin cancer was and a quarter inch around that area. Apply  until the redness and irritation develop (usually occurs by day 7), then stop and allow it to heal. 30 g 2   fluticasone  (FLONASE ) 50 MCG/ACT nasal spray Place 2 sprays into both nostrils daily. 48 g 3   Fluticasone -Umeclidin-Vilant (TRELEGY ELLIPTA ) 100-62.5-25 MCG/ACT AEPB Inhale 1 puff into the lungs daily. 28 each 0   latanoprost  (XALATAN ) 0.005 % ophthalmic solution daily.     montelukast  (SINGULAIR ) 10 MG tablet Take 1 tablet (10 mg total) by mouth at bedtime. 90 tablet 3   nitroGLYCERIN  (NITROSTAT ) 0.4 MG SL tablet PLACE 1 TABLET UNDER THE TONGUE EVERY 5 MINUTES AS NEEDED. 25 tablet 11   pantoprazole  (PROTONIX ) 40 MG tablet TAKE 1 TABLET EVERY DAY 90 tablet 3   tamsulosin  (FLOMAX ) 0.4 MG CAPS capsule TAKE 1 CAPSULE EVERY DAY 90 capsule 0   carvedilol  (  COREG ) 3.125 MG tablet Take 1 tablet (3.125 mg total) by mouth 2 (two) times daily. (Patient not taking: Reported on 01/22/2024) 60 tablet 0   No current facility-administered medications for this visit.    OBJECTIVE: Vitals:   01/22/24 1420  BP: (!) 140/78  Pulse: (!) 56  Resp: 18  Temp: 97.7 F (36.5 C)  SpO2: 98%     Body mass index is 27.79 kg/m.    ECOG FS:0 - Asymptomatic  General: Well-developed, well-nourished, no acute distress. Eyes: Pink conjunctiva, anicteric sclera. HEENT: Normocephalic, moist mucous membranes. Lungs: No audible wheezing or coughing. Heart: Regular rate and rhythm. Abdomen: Soft, nontender, no obvious distention. Musculoskeletal: No edema, cyanosis, or clubbing. Neuro: Alert, answering all questions appropriately. Cranial nerves grossly intact. Skin: No rashes or petechiae noted. Psych: Normal affect.  LAB RESULTS:  Lab Results  Component Value Date   NA 134 (L) 11/19/2023   K 4.1 11/19/2023   CL 102 11/19/2023   CO2 24 11/19/2023   GLUCOSE 93 11/19/2023   BUN 18 11/19/2023   CREATININE 1.08 11/19/2023   CALCIUM  8.9 11/19/2023   PROT 8.0 05/12/2023   ALBUMIN 4.5 05/12/2023   AST 38  05/12/2023   ALT 27 05/12/2023   ALKPHOS 54 05/12/2023   BILITOT 0.8 05/12/2023   GFRNONAA 45 (L) 05/12/2023   GFRAA 98 03/13/2020    Lab Results  Component Value Date   WBC 5.6 01/20/2024   NEUTROABS 3.2 01/20/2024   HGB 11.2 (L) 01/20/2024   HCT 34.5 (L) 01/20/2024   MCV 92.0 01/20/2024   PLT 169 01/20/2024   Lab Results  Component Value Date   IRON  53 01/20/2024   TIBC 356 01/20/2024   IRONPCTSAT 15 (L) 01/20/2024   Lab Results  Component Value Date   FERRITIN 10 (L) 01/20/2024     STUDIES: ECHOCARDIOGRAM COMPLETE Result Date: 01/21/2024    ECHOCARDIOGRAM REPORT   Patient Name:   Cory [name] Date of Exam: 01/21/2024 Medical Rec #:  [id number]          Height:       65.0 in Accession #:    [id number]         Weight:       166.4 lb Date of Birth:  [birth year]         BSA:          1.829 m Patient Age:    82 years           BP:           128/60 mmHg Patient Gender: M                  HR:           48 bpm. Exam Location:  [facility] Procedure: 2D Echo (Both Spectral and Color Flow Doppler were utilized during            procedure). Indications:     dyspnea  History:         Patient has prior history of Echocardiogram examinations, most                  recent 10/21/2021. CAD, Prior CABG; Risk Factors:Hypertension                  and Dyslipidemia.  Sonographer:     Tinnie Barefoot RDCS Referring Phys:  [id number] DARRIN BARN Diagnosing Phys: Evalene Lunger MD IMPRESSIONS  1. Left ventricular ejection fraction, by estimation, is 55  to 60%. The left ventricle has normal function. The left ventricle demonstrates regional wall motion abnormalities (basal inferior and apical inferior wall hypokinesis). There is mild left ventricular hypertrophy. Left ventricular diastolic parameters are consistent with Grade I diastolic dysfunction (impaired relaxation).  2. Right ventricular systolic function is normal. The right ventricular size is normal. There is normal pulmonary artery systolic  pressure. The estimated right ventricular systolic pressure is 24.7 mmHg.  3. Left atrial size was mildly dilated.  4. The mitral valve is normal in structure. Mild mitral valve regurgitation. No evidence of mitral stenosis.  5. The aortic valve is normal in structure. Aortic valve regurgitation is not visualized. Aortic valve sclerosis/calcification is present, without any evidence of aortic stenosis.  6. There is mild dilatation of the ascending aorta, measuring 42 mm.  7. The inferior vena cava is normal in size with greater than 50% respiratory variability, suggesting right atrial pressure of 3 mmHg. FINDINGS  Left Ventricle: Left ventricular ejection fraction, by estimation, is 55 to 60%. The left ventricle has normal function. The left ventricle demonstrates regional wall motion abnormalities. Strain was performed and the global longitudinal strain is indeterminate. The left ventricular internal cavity size was normal in size. There is mild left ventricular hypertrophy. Left ventricular diastolic parameters are consistent with Grade I diastolic dysfunction (impaired relaxation). Right Ventricle: The right ventricular size is normal. No increase in right ventricular wall thickness. Right ventricular systolic function is normal. There is normal pulmonary artery systolic pressure. The tricuspid regurgitant velocity is 2.33 m/s, and  with an assumed right atrial pressure of 3 mmHg, the estimated right ventricular systolic pressure is 24.7 mmHg. Left Atrium: Left atrial size was mildly dilated. Right Atrium: Right atrial size was normal in size. Pericardium: There is no evidence of pericardial effusion. Mitral Valve: The mitral valve is normal in structure. There is mild calcification of the mitral valve leaflet(s). Mild mitral valve regurgitation. No evidence of mitral valve stenosis. MV peak gradient, 7.0 mmHg. The mean mitral valve gradient is 2.0 mmHg. Tricuspid Valve: The tricuspid valve is normal in  structure. Tricuspid valve regurgitation is mild . No evidence of tricuspid stenosis. Aortic Valve: The aortic valve is normal in structure. Aortic valve regurgitation is not visualized. Aortic valve sclerosis/calcification is present, without any evidence of aortic stenosis. Pulmonic Valve: The pulmonic valve was normal in structure. Pulmonic valve regurgitation is mild. No evidence of pulmonic stenosis. Aorta: The aortic root is normal in size and structure. There is mild dilatation of the ascending aorta, measuring 42 mm. Venous: The inferior vena cava is normal in size with greater than 50% respiratory variability, suggesting right atrial pressure of 3 mmHg. IAS/Shunts: No atrial level shunt detected by color flow Doppler. Additional Comments: 3D was performed not requiring image post processing on an independent workstation and was indeterminate.  LEFT VENTRICLE PLAX 2D LVIDd:         4.70 cm     Diastology LVIDs:         3.20 cm     LV e' medial:    6.20 cm/s LV PW:         1.30 cm     LV E/e' medial:  14.3 LV IVS:        1.30 cm     LV e' lateral:   11.00 cm/s LVOT diam:     1.90 cm     LV E/e' lateral: 8.1 LV SV:         55 LV  SV Index:   30 LVOT Area:     2.84 cm  LV Volumes (MOD) LV vol d, MOD A2C: 89.1 ml LV vol s, MOD A2C: 48.8 ml LV SV MOD A2C:     40.3 ml RIGHT VENTRICLE            IVC RV S prime:     9.79 cm/s  IVC diam: 1.60 cm TAPSE (M-mode): 0.9 cm LEFT ATRIUM             Index LA diam:        5.00 cm 2.73 cm/m LA Vol (A2C):   64.9 ml 35.48 ml/m LA Vol (A4C):   71.6 ml 39.14 ml/m LA Biplane Vol: 71.4 ml 39.03 ml/m  AORTIC VALVE             PULMONIC VALVE LVOT Vmax:   80.20 cm/s  PR End Diast Vel: 5.02 msec LVOT Vmean:  50.800 cm/s LVOT VTI:    0.194 m  AORTA Ao Root diam: 3.40 cm Ao Asc diam:  4.20 cm MITRAL VALVE                TRICUSPID VALVE MV Area (PHT): 2.52 cm     TR Peak grad:   21.7 mmHg MV Area VTI:   1.05 cm     TR Vmax:        233.00 cm/s MV Peak grad:  7.0 mmHg MV Mean grad:   2.0 mmHg     SHUNTS MV Vmax:       1.32 m/s     Systemic VTI:  0.19 m MV Vmean:      70.2 cm/s    Systemic Diam: 1.90 cm MV Decel Time: 301 msec MV E velocity: 88.60 cm/s MV A velocity: 105.00 cm/s MV E/A ratio:  0.84 Evalene Lunger MD Electronically signed by Evalene Lunger MD Signature Date/Time: 01/21/2024/2:31:52 PM    Final     ASSESSMENT: Iron  deficiency anemia.  PLAN:    Iron  deficiency anemia: Patient's hemoglobin and iron  stores have trended down slightly and he is symptomatic.  He cannot tolerate oral iron  supplementation.  He has not had a colonoscopy or EGD in several years.  Proceed with 200 mg IV Venofer  today.  Return to clinic 3 times over the next 1 to 2 weeks for additional treatment.  Patient will then return to clinic in 4 months with repeat laboratory, further evaluation, and consideration of additional Venofer  if necessary.   Hypertension: Chronic and unchanged.  Continue monitoring and treatment per primary care. Joint pain: Patient does not complain of this today.  Continue symptomatic treatment.  I spent a total of 30 minutes reviewing chart data, face-to-face evaluation with the patient, counseling and coordination of care as detailed above.    Patient expressed understanding and was in agreement with this plan. He also understands that He can call clinic at any time with any questions, concerns, or complaints.    Evalene JINNY Reusing, MD   01/22/2024 2:26 PM

## 2024-01-26 ENCOUNTER — Inpatient Hospital Stay

## 2024-01-26 VITALS — BP 151/71 | HR 56 | Temp 96.4°F | Resp 18

## 2024-01-26 DIAGNOSIS — D509 Iron deficiency anemia, unspecified: Secondary | ICD-10-CM | POA: Diagnosis not present

## 2024-01-26 MED ORDER — IRON SUCROSE 20 MG/ML IV SOLN
200.0000 mg | Freq: Once | INTRAVENOUS | Status: AC
  Administered 2024-01-26: 200 mg via INTRAVENOUS
  Filled 2024-01-26: qty 10

## 2024-01-26 MED ORDER — SODIUM CHLORIDE 0.9% FLUSH
10.0000 mL | Freq: Once | INTRAVENOUS | Status: AC | PRN
  Administered 2024-01-26: 10 mL
  Filled 2024-01-26: qty 10

## 2024-01-28 ENCOUNTER — Inpatient Hospital Stay: Attending: Oncology

## 2024-01-28 VITALS — BP 157/78 | HR 48 | Temp 96.4°F | Resp 18

## 2024-01-28 DIAGNOSIS — D509 Iron deficiency anemia, unspecified: Secondary | ICD-10-CM | POA: Diagnosis present

## 2024-01-28 MED ORDER — SODIUM CHLORIDE 0.9% FLUSH
10.0000 mL | Freq: Once | INTRAVENOUS | Status: AC | PRN
  Administered 2024-01-28: 10 mL
  Filled 2024-01-28: qty 10

## 2024-01-28 MED ORDER — IRON SUCROSE 20 MG/ML IV SOLN
200.0000 mg | Freq: Once | INTRAVENOUS | Status: AC
  Administered 2024-01-28: 200 mg via INTRAVENOUS
  Filled 2024-01-28: qty 10

## 2024-02-02 ENCOUNTER — Inpatient Hospital Stay

## 2024-02-02 VITALS — BP 144/72 | HR 48 | Temp 95.0°F | Resp 19

## 2024-02-02 DIAGNOSIS — D509 Iron deficiency anemia, unspecified: Secondary | ICD-10-CM

## 2024-02-02 MED ORDER — IRON SUCROSE 20 MG/ML IV SOLN
200.0000 mg | Freq: Once | INTRAVENOUS | Status: AC
  Administered 2024-02-02: 200 mg via INTRAVENOUS
  Filled 2024-02-02: qty 10

## 2024-02-02 MED ORDER — SODIUM CHLORIDE 0.9% FLUSH
10.0000 mL | Freq: Once | INTRAVENOUS | Status: AC | PRN
  Administered 2024-02-02: 10 mL
  Filled 2024-02-02: qty 10

## 2024-02-05 ENCOUNTER — Telehealth: Payer: Self-pay

## 2024-02-05 NOTE — Telephone Encounter (Signed)
 Yes I recommend that he get the flu vaccine

## 2024-02-05 NOTE — Telephone Encounter (Signed)
 Copied from CRM [id number]. Topic: Clinical - Medical Advice >> Feb 04, 2024  2:31 PM Anairis L wrote: Reason for CRM: Patient would like to know if he should be taking the flu shot.   Please advise.

## 2024-02-10 ENCOUNTER — Ambulatory Visit (INDEPENDENT_AMBULATORY_CARE_PROVIDER_SITE_OTHER)

## 2024-02-10 DIAGNOSIS — Z23 Encounter for immunization: Secondary | ICD-10-CM

## 2024-02-10 NOTE — Progress Notes (Signed)
 Per orders of [employer] DPN AGNP-C, injection of High Dose Flu Vaccine given by Nellie Hummer in left deltoid. Patient tolerated injection well.

## 2024-02-12 ENCOUNTER — Other Ambulatory Visit: Payer: Self-pay | Admitting: Nurse Practitioner

## 2024-02-12 DIAGNOSIS — J302 Other seasonal allergic rhinitis: Secondary | ICD-10-CM

## 2024-02-13 ENCOUNTER — Ambulatory Visit: Admitting: Urology

## 2024-02-13 VITALS — BP 112/56 | HR 56 | Ht 65.0 in | Wt 160.0 lb

## 2024-02-13 DIAGNOSIS — R3915 Urgency of urination: Secondary | ICD-10-CM | POA: Diagnosis not present

## 2024-02-13 DIAGNOSIS — N138 Other obstructive and reflux uropathy: Secondary | ICD-10-CM

## 2024-02-13 DIAGNOSIS — N401 Enlarged prostate with lower urinary tract symptoms: Secondary | ICD-10-CM

## 2024-02-13 LAB — URINALYSIS, COMPLETE
Bilirubin, UA: NEGATIVE
Glucose, UA: NEGATIVE
Ketones, UA: NEGATIVE
Leukocytes,UA: NEGATIVE
Nitrite, UA: NEGATIVE
Protein,UA: NEGATIVE
Specific Gravity, UA: 1.02 (ref 1.005–1.030)
Urobilinogen, Ur: 0.2 mg/dL (ref 0.2–1.0)
pH, UA: 6 (ref 5.0–7.5)

## 2024-02-13 LAB — MICROSCOPIC EXAMINATION: Bacteria, UA: NONE SEEN

## 2024-02-13 LAB — BLADDER SCAN AMB NON-IMAGING

## 2024-02-13 MED ORDER — OXYBUTYNIN CHLORIDE ER 10 MG PO TB24: 10.0000 mg | ORAL_TABLET | Freq: Every day | ORAL | 2 refills | Status: DC

## 2024-02-13 MED ORDER — ALFUZOSIN HCL ER 10 MG PO TB24: 10.0000 mg | ORAL_TABLET | Freq: Every day | ORAL | 2 refills | Status: DC

## 2024-02-13 NOTE — Patient Instructions (Signed)
 Discontinue the tamsulosin  (Flomax ) and start alfuzosin (Uroxatrol)

## 2024-02-16 ENCOUNTER — Encounter: Payer: Self-pay | Admitting: Urology

## 2024-02-16 NOTE — Progress Notes (Signed)
 02/13/2024 10:58 AM   [name] 1940/12/21 [id number]  Referring provider: Wendee [name] HERO, NP [address] [place],  [place] [address]  Urological history 1. BPH with LU TS -aged out of PSA screening -cysto 06/2020 - mild prostamegaly with bladder trabeculation -tamsulosin  0.4 mg daily  2. Urinary frequency -contributing factors of age, BPH, stroke, HTN, diabetes, anxiety and depression -failed Gemtesa  75 mg daily and it was cost prohibitive   3. Sepsis E. Coli bacteremia secondary to UTI 09/2021 -non-contrast CT, 09/2021 - NED  Chief Complaint  Patient presents with   Benign Prostatic Hypertrophy    HPI: Cory [name] is a 83 y.o. man who presents today for follow up.  Previous records reviewed.   At his visit with me in July, he was experiencing  feet on the floor incontinence and urgency.  Bladder scan noted adequate bladder emptying.  And I referred him to pelvic floor physical therapy.  He states he was not called.  Today he states he is having frequency, urgency, urge incontinence and a weak urinary stream that occurs mostly at night.  I PSS 14/4  Patient denies any modifying or aggravating factors.  Patient denies any recent UTI's, gross hematuria, dysuria or suprapubic/flank pain.  Patient denies any fevers, chills, nausea or vomiting.    He has also been experiencing a lot of nasal congestions during the night which is also bothersome to sleep.  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 Nodular and infiltrative. Mohs pending.   Basal cell carcinoma  12/22/2023   Left Inframammary Fold, pt scheduled for excision   Basal cell carcinoma 12/22/2023   Left Superior Shoulder, scheduled for surgery   Basal cell carcinoma 12/22/2023   Right Shoulder - Posterior, EDC 01/06/24   Basal cell carcinoma 12/22/2023   Left Lower Back, pt schedule for excision   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 Clinically clear with biopsy, monitor for recurrence.   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   Left scalp - EDC   Stroke (HCC)    on Plavix  and ASA   Vasovagal syncope     Surgical History: Past Surgical History:  Procedure Laterality Date   CHOLECYSTECTOMY     CORONARY ARTERY BYPASS GRAFT  2000   DEBRIDEMENT AND CLOSURE WOUND N/A 01/19/2021   Procedure: Closure of scalp defect;  Surgeon: Elisabeth Craig RAMAN, MD;  Location: [facility];  Service: Plastics;  Laterality: N/A;   MOHS SURGERY     NECK SURGERY      Home Medications:  Allergies as  of 02/13/2024       Reactions   Fesoterodine Other (See Comments)   Dry mouth and constipation   Isosorbide Other (See Comments)   Other Other (See Comments)   Grass Pollen(k-o-r-t-swt Vern) Other (See Comments)   Hydrocodone Anxiety   Meperidine Other (See Comments), Rash   Other reaction(s): Other (See Comments) HALLUCINATIONS   Penicillins Rash        Medication List        Accurate as of February 13, 2024 11:59 PM. If you have any questions, ask your nurse or doctor.          STOP taking these medications    fluorouracil  5 % cream Commonly known as:  EFUDEX    tamsulosin  0.4 MG Caps capsule Commonly known as: FLOMAX        TAKE these medications    acetaminophen  500 MG tablet Commonly known as: TYLENOL  Take 1,000 mg by mouth at bedtime.   albuterol  108 (90 Base) MCG/ACT inhaler Commonly known as: VENTOLIN  HFA Inhale 2 puffs into the lungs every 6 (six) hours as needed for wheezing or shortness of breath.   alfuzosin 10 MG 24 hr tablet Commonly known as: UROXATRAL Take 1 tablet (10 mg total) by mouth daily with breakfast.   amLODipine  5 MG tablet Commonly known as: NORVASC  TAKE 1 TABLET EVERY DAY   aspirin  EC 81 MG tablet Take 1 tablet (81 mg total) by mouth daily. Swallow whole.   atorvastatin  40 MG tablet Commonly known as: LIPITOR Take 1 tablet (40 mg total) by mouth daily.   carvedilol  6.25 MG tablet Commonly known as: COREG  Take 1 tablet (6.25 mg total) by mouth 2 (two) times daily with a meal. What changed: Another medication with the same name was removed. Continue taking this medication, and follow the directions you see here.   clopidogrel  75 MG tablet Commonly known as: PLAVIX  Take 1 tablet (75 mg total) by mouth daily.   diclofenac Sodium 1 % Gel Commonly known as: VOLTAREN Apply 2 g topically as needed.   dorzolamide-timolol 2-0.5 % ophthalmic solution Commonly known as: COSOPT Place 1 drop into both eyes 2 (two) times daily.   escitalopram  5 MG tablet Commonly known as: LEXAPRO  Take 5 mg by mouth daily.   famotidine  20 MG tablet Commonly known as: PEPCID  Take 20 mg by mouth at bedtime.   fexofenadine  180 MG tablet Commonly known as: Allegra  Allergy Take 1 tablet (180 mg total) by mouth daily.   fluticasone  50 MCG/ACT nasal spray Commonly known as: FLONASE  Place 2 sprays into both nostrils daily.   latanoprost  0.005 % ophthalmic solution Commonly known as: XALATAN  daily.   montelukast  10 MG tablet Commonly known as: SINGULAIR  TAKE 1 TABLET (10 MG TOTAL) BY MOUTH AT BEDTIME.    nitroGLYCERIN  0.4 MG SL tablet Commonly known as: NITROSTAT  PLACE 1 TABLET UNDER THE TONGUE EVERY 5 MINUTES AS NEEDED.   oxybutynin 10 MG 24 hr tablet Commonly known as: DITROPAN-XL Take 1 tablet (10 mg total) by mouth daily.   pantoprazole  40 MG tablet Commonly known as: PROTONIX  TAKE 1 TABLET EVERY DAY   Trelegy Ellipta  100-62.5-25 MCG/ACT Aepb Generic drug: Fluticasone -Umeclidin-Vilant Inhale 1 puff into the lungs daily.        Allergies:  Allergies  Allergen Reactions   Fesoterodine Other (See Comments)    Dry mouth and constipation   Isosorbide Other (See Comments)   Other Other (See Comments)   Grass Pollen(K-O-R-T-Swt Vern) Other (See Comments)   Hydrocodone Anxiety  Meperidine Other (See Comments) and Rash    Other reaction(s): Other (See Comments) HALLUCINATIONS    Penicillins Rash    Family History: Family History  Problem Relation Age of Onset   Heart Problems Mother    Heart disease Mother    Heart Problems Father    Heart attack Father    Heart disease Brother     Social History:  reports that he has quit smoking. His smoking use included pipe. He has never used smokeless tobacco. He reports that he does not currently use alcohol. He reports that he does not use drugs.  ROS: Pertinent ROS in HPI  Physical Exam: BP (!) 112/56 (BP Location: Left Arm, Patient Position: Sitting, Cuff Size: Normal)   Pulse (!) 56   Ht 5' 5 (1.651 m)   Wt 160 lb (72.6 kg)   SpO2 96%   BMI 26.63 kg/m   Constitutional:  Well nourished. Alert and oriented, No acute distress. HEENT: [place] AT, moist mucus membranes.  Trachea midline Cardiovascular: No clubbing, cyanosis, or edema. Respiratory: Normal respiratory effort, no increased work of breathing. Neurologic: Grossly intact, no focal deficits, moving all 4 extremities. Psychiatric: Normal mood and affect.   Laboratory Data: See Epic and HPI   I have reviewed the labs.   Pertinent Imaging:   02/13/24 11:03   Scan Result 4mL    Assessment & Plan:    1. BPH with LUTS - Since he is having issues with nasal congestion especially at night, we will switch the tamsulosin  to alfuzosin 10 mg daily to see if this alpha-blocker will give him less of that side effect  2. Urgency/frequency  - discussed PTNS therapy to see if he would want to try that, he deferred -Since Myrbetriq and Gemtesa  are cost prohibitive and he felt they did not help, we discussed trying an anticholinergic, I explained the side effects of dry eyes, dry mouth, constipation and mental status changes especially in individuals over the age of 55, he voices understanding of the side effects and wanted to go ahead and try an anticholinergic -I sent a prescription for oxybutynin XL 10 mg daily to the pharmacy  Return in about 2 months (around 04/14/2024) for I PSS, PVR .  These notes generated with voice recognition software. I apologize for typographical errors.  CLOTILDA HELON RIGGERS  [facility] [address]  [address] [place], [place] [address] [phone]

## 2024-02-25 ENCOUNTER — Other Ambulatory Visit: Payer: Self-pay | Admitting: Nurse Practitioner

## 2024-02-25 DIAGNOSIS — I6930 Unspecified sequelae of cerebral infarction: Secondary | ICD-10-CM

## 2024-02-25 MED ORDER — CLOPIDOGREL BISULFATE 75 MG PO TABS
75.0000 mg | ORAL_TABLET | Freq: Every day | ORAL | 2 refills | Status: AC
Start: 2024-02-25 — End: ?

## 2024-02-25 MED ORDER — CLOPIDOGREL BISULFATE 75 MG PO TABS: 75.0000 mg | ORAL_TABLET | Freq: Every day | ORAL | 0 refills | Status: DC

## 2024-02-25 NOTE — Telephone Encounter (Signed)
 Copied from CRM [id number]. Topic: Clinical - Medication Refill >> Feb 25, 2024 12:14 PM Mercedes MATSU wrote: Medication: clopidogrel  (PLAVIX ) 75 MG tablet [facility], and 90 [facility] supply [facility]   Has the patient contacted their pharmacy? Yes (Agent: If no, request that the patient contact the pharmacy for the refill. If patient does not wish to contact the pharmacy document the reason why and proceed with request.) (Agent: If yes, when and what did the pharmacy advise?)  This is the patient's preferred pharmacy:  [phone] [place] - [address] [place] [address] Phone: [phone] Fax: [phone]   Is this the correct pharmacy for this prescription? Yes If no, delete pharmacy and type the correct one.   Has the prescription been filled recently? Yes  Is the patient out of the medication? Yes  Has the patient been seen for an appointment in the last year OR does the patient have an upcoming appointment? Yes  Can we respond through MyChart? Yes  Agent: Please be advised that Rx refills may take up to 3 business days. We ask that you follow-up with your pharmacy.

## 2024-02-25 NOTE — Telephone Encounter (Signed)
 [facility] and 90 day to [facility]

## 2024-03-01 ENCOUNTER — Other Ambulatory Visit: Payer: Self-pay | Admitting: Cardiovascular Disease

## 2024-03-02 ENCOUNTER — Encounter: Payer: Self-pay | Admitting: Nurse Practitioner

## 2024-03-03 NOTE — Telephone Encounter (Signed)
 Your note on 08/20/23 was this was discontinued. Please advise

## 2024-03-11 ENCOUNTER — Ambulatory Visit

## 2024-03-11 VITALS — BP 116/68 | Ht 65.0 in | Wt 165.0 lb

## 2024-03-11 DIAGNOSIS — Z0000 Encounter for general adult medical examination without abnormal findings: Secondary | ICD-10-CM

## 2024-03-11 NOTE — Patient Instructions (Signed)
 Mr. Mundie,  Thank you for taking the time for your Medicare Wellness Visit. I appreciate your continued commitment to your health goals. Please review the care plan we discussed, and feel free to reach out if I can assist you further.  Please note that Annual Wellness Visits do not include a physical exam. Some assessments may be limited, especially if the visit was conducted virtually. If needed, we may recommend an in-person follow-up with your provider.  Ongoing Care Seeing your primary care provider every 3 to 6 months helps us  monitor your health and provide consistent, personalized care.   Referrals If a referral was made during today's visit and you haven't received any updates within two weeks, please contact the referred provider directly to check on the status.  Recommended Screenings:  Health Maintenance  Topic Date Due   Yearly kidney health urinalysis for diabetes  Never done   Medicare Annual Wellness Visit  03/05/2024   COVID-19 Vaccine (3 - Pfizer risk series) 04/27/2024*   Hemoglobin A1C  05/21/2024   Complete foot exam   07/16/2024   Yearly kidney function blood test for diabetes  11/18/2024   Eye exam for diabetics  12/14/2024   DTaP/Tdap/Td vaccine (5 - Td or Tdap) 04/05/2031   Pneumococcal Vaccine for age over 80  Completed   Flu Shot  Completed   Zoster (Shingles) Vaccine  Completed   Meningitis B Vaccine  Aged Out  *Topic was postponed. The date shown is not the original due date.       03/10/2024    8:01 PM  Advanced Directives  Does Patient Have a Medical Advance Directive? Yes  Type of Advance Directive Living will    Vision: Annual vision screenings are recommended for early detection of glaucoma, cataracts, and diabetic retinopathy. These exams can also reveal signs of chronic conditions such as diabetes and high blood pressure.  Dental: Annual dental screenings help detect early signs of oral cancer, gum disease, and other conditions linked to  overall health, including heart disease and diabetes.

## 2024-03-11 NOTE — Progress Notes (Signed)
 Chief Complaint  Patient presents with   Medicare Wellness     Subjective:   Cory [name] is a 83 y.o. male who presents for a Medicare Annual Wellness Visit.  Allergies (verified) Fesoterodine, Isosorbide, Other, Grass pollen([id number] vern), Hydrocodone, Meperidine, and Penicillins   History: Past Medical History:  Diagnosis Date   Actinic keratosis 08/19/2023   Hypertrophic AK,  R lower lip, recheck at f/u 10/06/23   Allergy    Anxiety    Arthritis    Basal cell carcinoma 06/25/2023   Central right forehead. Nodular and infiltrative. Mohs pending.   Basal cell carcinoma 12/22/2023   Left Inframammary Fold, pt scheduled for excision   Basal cell carcinoma 12/22/2023   Left Superior Shoulder, scheduled for surgery   Basal cell carcinoma 12/22/2023   Right Shoulder - Posterior, EDC 01/06/24   Basal cell carcinoma 12/22/2023   Left Lower Back, pt schedule for excision   BCC (basal cell carcinoma of skin) 07/16/2023   BCC on right central forehead treated with Mohs 07/16/23    CAD (coronary artery disease)    CABG   Cataract    Depression    Diabetes mellitus without complication (HCC)    GERD (gastroesophageal reflux disease)    Glaucoma    Heart murmur    Hyperlipidemia    Hypertension    Myocardial infarction (HCC)    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 Clinically clear with biopsy, monitor for recurrence.   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   Left scalp - EDC   Stroke (HCC)    on Plavix  and ASA   Vasovagal syncope    Past Surgical History:  Procedure Laterality Date   CHOLECYSTECTOMY     CORONARY ARTERY BYPASS GRAFT  2000   COSMETIC SURGERY  2022   DEBRIDEMENT AND CLOSURE WOUND N/A 01/19/2021   Procedure: Closure of scalp defect;  Surgeon: Elisabeth Craig RAMAN, MD;  Location: [facility];  Service: Plastics;  Laterality:  N/A;   EYE SURGERY     JOINT REPLACEMENT  2017   MOHS SURGERY     NECK SURGERY     VASECTOMY     Family History  Problem Relation Age of Onset   Heart Problems Mother    Heart disease Mother    Arthritis Mother    Hypertension Mother    Stroke Mother    Heart Problems Father    Heart attack Father    Heart disease Father    Hypertension Father    Heart disease Brother    Hypertension Brother    Social History   Occupational History   Occupation: retired  Tobacco Use   Smoking status: Former    Current packs/day: 0.00    Types: Pipe, Cigars, Cigarettes    Quit date: 04/29/1989    Years since quitting: 34.8   Smokeless tobacco: Never  Vaping Use   Vaping status: Never Used  Substance and Sexual Activity   Alcohol use: Not Currently   Drug use: Never   Sexual activity: Not Currently    Birth control/protection: Abstinence   Tobacco Counseling Counseling given: Not Answered  SDOH Screenings   Food Insecurity: No Food Insecurity (03/10/2024)  Housing: Unknown (03/10/2024)  Transportation Needs: No Transportation Needs (03/10/2024)  Utilities: Not At Risk (03/11/2024)  Alcohol Screen: Low Risk  (02/12/2022)  Depression (PHQ2-9): Low Risk  (  03/11/2024)  Financial Resource Strain: Low Risk  (03/10/2024)  Physical Activity: Inactive (03/10/2024)  Social Connections: Moderately Isolated (03/10/2024)  Stress: Stress Concern Present (03/11/2024)  Tobacco Use: Medium Risk (03/11/2024)  Health Literacy: Adequate Health Literacy (03/11/2024)   See flowsheets for full screening details  Depression Screen PHQ 2 & 9 Depression Scale- Over the past 2 weeks, how often have you been bothered by any of the following problems? Little interest or pleasure in doing things: 0 Feeling down, depressed, or hopeless (PHQ Adolescent also includes...irritable): 0 PHQ-2 Total Score: 0 Trouble falling or staying asleep, or sleeping too much: 0 Feeling tired or having little energy:  0 Poor appetite or overeating (PHQ Adolescent also includes...weight loss): 0 Feeling bad about yourself - or that you are a failure or have let yourself or your family down: 0 Trouble concentrating on things, such as reading the newspaper or watching television (PHQ Adolescent also includes...like school work): 0 Moving or speaking so slowly that other people could have noticed. Or the opposite - being so fidgety or restless that you have been moving around a lot more than usual: 0 Thoughts that you would be better off dead, or of hurting yourself in some way: 0 PHQ-9 Total Score: 0 If you checked off any problems, how difficult have these problems made it for you to do your work, take care of things at home, or get along with other people?: Not difficult at all     Goals Addressed               This Visit's Progress     Patient Stated (pt-stated)        03/11/24-Maintain general health.       Visit info / Clinical Intake: Medicare Wellness Visit Type:: Subsequent Annual Wellness Visit Persons participating in visit:: patient Medicare Wellness Visit Mode:: In-person (required for [facility]) Information given by:: patient Interpreter Needed?: No Pre-visit prep was completed: yes AWV questionnaire completed by patient prior to visit?: yes Date:: 03/10/24 Living arrangements:: lives with spouse/significant other Patient's Overall Health Status Rating: (!) fair Typical amount of pain: some Does pain affect daily life?: (!) yes (some move less) Are you currently prescribed opioids?: no  Dietary Habits and Nutritional Risks How many meals a day?: 2 Eats fruit and vegetables daily?: yes Most meals are obtained by: preparing own meals; eating out (mix of both) In the last 2 weeks, have you had any of the following?: (!) nausea, vomiting, diarrhea (diarrhea and nausea at night) Diabetic:: (!) yes Any non-healing wounds?: no How often do you check your BS?: 0 Would you like to be  referred to a Nutritionist or for Diabetic Management? : no  Functional Status Activities of Daily Living (to include ambulation/medication): (!) (Patient-Rptd) Needs Assist Ambulation: Independent with device- listed below Home Assistive Devices/Equipment: Cane Medication Administration: Independent Home Management: (Patient-Rptd) Independent Manage your own finances?: yes Primary transportation is: driving Concerns about vision?: (!) yes Concerns about hearing?: (!) yes Uses hearing aids?: no Hear whispered voice?: (!) no *in-person visit only*  Fall Screening Falls in the past year?: (Patient-Rptd) 0 Number of falls in past year: 0 Was there an injury with Fall?: (Patient-Rptd) 0 Fall Risk Category Calculator: 0 Patient Fall Risk Level: Low Fall Risk  Fall Risk Patient at Risk for Falls Due to: Impaired balance/gait; Impaired mobility Fall risk Follow up: Education provided; Falls prevention discussed  Home and Transportation Safety: All rugs have non-skid backing?: N/A, no rugs All stairs or steps  have railings?: yes Grab bars in the bathtub or shower?: yes Have non-skid surface in bathtub or shower?: yes Good home lighting?: yes Regular seat belt use?: yes Hospital stays in the last year:: no  Cognitive Assessment Difficulty concentrating, remembering, or making decisions? : yes Will 6CIT or Mini Cog be Completed: yes What year is it?: 0 points What month is it?: 0 points Give patient an address phrase to remember (5 components): [address] [place]  About what time is it?: 0 points Count backwards from 20 to 1: 0 points Say the months of the year in reverse: 0 points Repeat the address phrase from earlier: 0 points 6 CIT Score: 0 points  Advance Directives (For Healthcare) Does Patient Have a Medical Advance Directive?: Yes Does patient want to make changes to medical advance directive?: No - Patient declined Type of Advance Directive: Living  will Would patient like information on creating a medical advance directive?: No - Patient declined  Reviewed/Updated  Reviewed/Updated: Reviewed All (Medical, Surgical, Family, Medications, Allergies, Care Teams, Patient Goals)        Objective:    Today's Vitals   03/11/24 1344  Weight: 165 lb (74.8 kg)  Height: 5' 5 (1.651 m)   Body mass index is 27.46 kg/m.  Current Medications (verified) Outpatient Encounter Medications as of 03/11/2024  Medication Sig   acetaminophen  (TYLENOL ) 500 MG tablet Take 1,000 mg by mouth at bedtime.   albuterol  (VENTOLIN  HFA) 108 (90 Base) MCG/ACT inhaler Inhale 2 puffs into the lungs every 6 (six) hours as needed for wheezing or shortness of breath.   amLODipine  (NORVASC ) 5 MG tablet TAKE 1 TABLET EVERY DAY   aspirin  EC 81 MG tablet Take 1 tablet (81 mg total) by mouth daily. Swallow whole.   atorvastatin  (LIPITOR) 40 MG tablet Take 1 tablet (40 mg total) by mouth daily.   carvedilol  (COREG ) 6.25 MG tablet TAKE 1 TABLET TWICE DAILY WITH MEALS   clopidogrel  (PLAVIX ) 75 MG tablet Take 1 tablet (75 mg total) by mouth daily.   clopidogrel  (PLAVIX ) 75 MG tablet Take 1 tablet (75 mg total) by mouth daily.   diclofenac Sodium (VOLTAREN) 1 % GEL Apply 2 g topically as needed.   dorzolamide-timolol (COSOPT) 2-0.5 % ophthalmic solution Place 1 drop into both eyes 2 (two) times daily.   escitalopram  (LEXAPRO ) 5 MG tablet Take 5 mg by mouth daily.   famotidine  (PEPCID ) 20 MG tablet Take 20 mg by mouth at bedtime.   fexofenadine  (ALLEGRA  ALLERGY) 180 MG tablet Take 1 tablet (180 mg total) by mouth daily.   fluticasone  (FLONASE ) 50 MCG/ACT nasal spray Place 2 sprays into both nostrils daily.   Fluticasone -Umeclidin-Vilant (TRELEGY ELLIPTA ) 100-62.5-25 MCG/ACT AEPB Inhale 1 puff into the lungs daily.   latanoprost  (XALATAN ) 0.005 % ophthalmic solution daily.   montelukast  (SINGULAIR ) 10 MG tablet TAKE 1 TABLET (10 MG TOTAL) BY MOUTH AT BEDTIME.    nitroGLYCERIN  (NITROSTAT ) 0.4 MG SL tablet PLACE 1 TABLET UNDER THE TONGUE EVERY 5 MINUTES AS NEEDED.   pantoprazole  (PROTONIX ) 40 MG tablet TAKE 1 TABLET EVERY DAY   alfuzosin (UROXATRAL) 10 MG 24 hr tablet Take 1 tablet (10 mg total) by mouth daily with breakfast.   oxybutynin (DITROPAN-XL) 10 MG 24 hr tablet Take 1 tablet (10 mg total) by mouth daily.   No facility-administered encounter medications on file as of 03/11/2024.   Hearing/Vision screen Vision Screening - Comments:: UTD with eye visits to Dr Mittie Immunizations and Health Maintenance Health Maintenance  Topic Date Due   Diabetic kidney evaluation - Urine ACR  Never done   Medicare Annual Wellness (AWV)  03/05/2024   COVID-19 Vaccine (3 - Pfizer risk series) 04/27/2024 (Originally 07/13/2019)   HEMOGLOBIN A1C  05/21/2024   FOOT EXAM  07/16/2024   Diabetic kidney evaluation - eGFR measurement  11/18/2024   OPHTHALMOLOGY EXAM  12/14/2024   DTaP/Tdap/Td (5 - Td or Tdap) 04/05/2031   Pneumococcal Vaccine: 50+ Years  Completed   Influenza Vaccine  Completed   Zoster Vaccines- Shingrix  Completed   Meningococcal B Vaccine  Aged Out        Assessment/Plan:  This is a routine wellness examination for Effrey.  Patient Care Team: Wendee [name] HERO, NP as PCP - General (Nurse Practitioner) Darron Deatrice LABOR, MD as PCP - Cardiology (Cardiology) Penne Knee, MD (Inactive) as Consulting Physician (Urology) Fate Morna SAILOR, [facility] (Inactive) as Pharmacist (Pharmacist) Mittie Gaskin, MD as Referring Physician (Ophthalmology) Jacobo Evalene PARAS, MD as Consulting Physician (Oncology)  I have personally reviewed and noted the following in the patient's chart:   Medical and social history Use of alcohol, tobacco or illicit drugs  Current medications and supplements including opioid prescriptions. Functional ability and status Nutritional status Physical activity Advanced directives List of other  physicians Hospitalizations, surgeries, and ER visits in previous 12 months Vitals Screenings to include cognitive, depression, and falls Referrals and appointments  No orders of the defined types were placed in this encounter.  In addition, I have reviewed and discussed with patient certain preventive protocols, quality metrics, and best practice recommendations. A written personalized care plan for preventive services as well as general preventive health recommendations were provided to patient.   Erminio LITTIE Saris, LPN   [id number]   No follow-ups on file.  After Visit Summary: (MyChart) Due to this being a telephonic visit, the after visit summary with patients personalized plan was offered to patient via MyChart   Nurse Notes: Pt here for AWV who complains of a little pressure in his chest (more to left side). He relays he has a random cough also. I offered pt an appt this afternoon with a provider in clinic and he declined. He says he needs to go home and check on wife/house.Pt says he would wait until next week to see Cable. I encouraged py NOT to wait through weekend and agreed to come in the morning. I encouraged pt to access ER if sypmtoms worsen and utilize the Nitroglycerin  if needed as ordered.

## 2024-03-12 ENCOUNTER — Ambulatory Visit: Admitting: Family Medicine

## 2024-03-12 ENCOUNTER — Encounter: Payer: Self-pay | Admitting: Family Medicine

## 2024-03-12 VITALS — BP 128/62 | HR 47 | Temp 97.6°F | Ht 65.0 in | Wt 162.4 lb

## 2024-03-12 DIAGNOSIS — R0789 Other chest pain: Secondary | ICD-10-CM

## 2024-03-12 DIAGNOSIS — I2510 Atherosclerotic heart disease of native coronary artery without angina pectoris: Secondary | ICD-10-CM

## 2024-03-12 NOTE — Progress Notes (Signed)
 Patient ID: Cory [name], male    DOB: 1940-06-13, 83 y.o.   MRN: [id number]  This visit was conducted in person.  BP 128/62   Pulse (!) 47   Temp 97.6 F (36.4 C) (Temporal)   Ht 5' 5 (1.651 m)   Wt 162 lb 6 oz (73.7 kg)   SpO2 96%   BMI 27.02 kg/m    CC:  Chief Complaint  Patient presents with   Cough    Started over the Summer   Chest Pain   Nasal Congestion    [place] when he lays on his side    Subjective:   HPI: Cory [name] is a 83 y.o. 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  On  Asa, plavix , coreg , lipitor   Of note, past office visit from August 20, 2023 noted regarding chronic cough.  Patient had tried GERD medication along with allergy medication in the past.  Trial of Flonase  completed without benefit. No change with ACE changing to an ARB. Had chest x-ray that had no acute findings. He was referred to pulmonary..  Reviewed initial October 01, 2023 office visit  PFTs showed reduced DLCO suggesting PVD Recommended echo and sleep study Given trial of ICS-LABA  12/202 had CT angio: Lungs/Pleura: No pleural effusion. Subpleural linear opacities in the lung bases are slightly improved since previous exam. No new infiltrate or worrisome nodule. Had moderate large hiatal hernia.  Last Cardiology note 12/05/2023 reviewed  TODAY He has noted  chest pressure for a couple of years occassionally,  not worsening but occurred yesterday at [facility] with nurse.  Has noted some increase frequency in last week, multiple times a day... no exertional  component.  He is also feeling more fatigued.  Describes chest pain as a pressure in left chest. No associated with chest pain.  No associated nausea or sweats.  Nitroglycerin  helps.. pain goes away.  Some time relieved with burping. Does have heartburn every few days.  Using pepcid   at night and  pantoprazole  in AM.    He feels Trelegy did help with  SOB or choric cough after sample x few weeks.  She saw Erminio yesterday for AMW and was told to be seen.    Relevant past medical, surgical, family and social history reviewed and updated as indicated. Interim medical history since our last visit reviewed. Allergies and medications reviewed and updated. Outpatient Medications Prior to Visit  Medication Sig Dispense Refill   acetaminophen  (TYLENOL ) 500 MG tablet Take 1,000 mg by mouth at bedtime.     albuterol  (VENTOLIN  HFA) 108 (90 Base) MCG/ACT inhaler Inhale 2 puffs into the lungs every 6 (six) hours as needed for wheezing or shortness of breath. 1 each 4   amLODipine  (NORVASC ) 5 MG tablet TAKE 1 TABLET EVERY DAY 90 tablet 3   aspirin  EC 81 MG tablet Take 1 tablet (81 mg total) by mouth daily. Swallow whole. 90 tablet 3   atorvastatin  (LIPITOR) 40 MG tablet Take 1 tablet (40 mg total) by mouth daily. 90 tablet 3  carvedilol  (COREG ) 6.25 MG tablet TAKE 1 TABLET TWICE DAILY WITH MEALS 180 tablet 2   clopidogrel  (PLAVIX ) 75 MG tablet Take 1 tablet (75 mg total) by mouth daily. (Patient not taking: Reported on 04/09/2024) 30 tablet 0   clopidogrel  (PLAVIX ) 75 MG tablet Take 1 tablet (75 mg total) by mouth daily. 90 tablet 2   diclofenac Sodium (VOLTAREN) 1 % GEL Apply 2 g topically as needed.     dorzolamide-timolol (COSOPT) 2-0.5 % ophthalmic solution Place 1 drop into both eyes 2 (two) times daily.     escitalopram  (LEXAPRO ) 5 MG tablet Take 5 mg by mouth daily.     fexofenadine  (ALLEGRA  ALLERGY) 180 MG tablet Take 1 tablet (180 mg total) by mouth daily. 30 tablet 1   fluticasone   (FLONASE ) 50 MCG/ACT nasal spray Place 2 sprays into both nostrils daily. 48 g 3   latanoprost  (XALATAN ) 0.005 % ophthalmic solution daily.     montelukast  (SINGULAIR ) 10 MG tablet TAKE 1 TABLET (10 MG TOTAL) BY MOUTH AT BEDTIME. 90 tablet 3   nitroGLYCERIN  (NITROSTAT ) 0.4 MG SL tablet PLACE 1 TABLET UNDER THE TONGUE EVERY 5 MINUTES AS NEEDED. 25 tablet 11   pantoprazole  (PROTONIX ) 40 MG tablet TAKE 1 TABLET EVERY DAY 90 tablet 3   famotidine  (PEPCID ) 20 MG tablet Take 20 mg by mouth at bedtime.     alfuzosin  (UROXATRAL ) 10 MG 24 hr tablet Take 1 tablet (10 mg total) by mouth daily with breakfast. 30 tablet 2   Fluticasone -Umeclidin-Vilant (TRELEGY ELLIPTA ) 100-62.5-25 MCG/ACT AEPB Inhale 1 puff into the lungs daily. 28 each 0   oxybutynin  (DITROPAN -XL) 10 MG 24 hr tablet Take 1 tablet (10 mg total) by mouth daily. 30 tablet 2   No facility-administered medications prior to visit.     Per HPI unless specifically indicated in ROS section below Review of Systems  Constitutional:  Negative for fatigue and fever.  HENT:  Negative for ear pain.   Eyes:  Negative for pain.  Respiratory:  Negative for cough and shortness of breath.   Cardiovascular:  Positive for chest pain. Negative for palpitations and leg swelling.  Gastrointestinal:  Negative for abdominal pain.  Genitourinary:  Negative for dysuria.  Musculoskeletal:  Negative for arthralgias.  Neurological:  Negative for syncope, light-headedness and headaches.  Psychiatric/Behavioral:  Negative for dysphoric mood.    Objective:  BP 128/62   Pulse (!) 47   Temp 97.6 F (36.4 C) (Temporal)   Ht 5' 5 (1.651 m)   Wt 162 lb 6 oz (73.7 kg)   SpO2 96%   BMI 27.02 kg/m   Wt Readings from Last 3 Encounters:  04/09/24 165 lb 3.2 oz (74.9 kg)  03/12/24 162 lb 6 oz (73.7 kg)  03/11/24 165 lb (74.8 kg)      Physical Exam Constitutional:      Appearance: He is well-developed.  HENT:     Head: Normocephalic.     Right Ear: Hearing  normal.     Left Ear: Hearing normal.     Nose: Nose normal.  Neck:     Thyroid : No thyroid  mass or thyromegaly.     Vascular: No carotid bruit.     Trachea: Trachea normal.  Cardiovascular:     Rate and Rhythm: Normal rate and regular rhythm.     Pulses: Normal pulses.     Heart sounds: Heart sounds not distant. No murmur heard.    No friction rub. No gallop.     Comments: No peripheral  edema Pulmonary:     Effort: Pulmonary effort is normal. No respiratory distress.     Breath sounds: Normal breath sounds.  Skin:    General: Skin is warm and dry.     Findings: No rash.  Psychiatric:        Speech: Speech normal.        Behavior: Behavior normal.        Thought Content: Thought content normal.       Results for orders placed or performed in visit on 02/13/24  Microscopic Examination   Collection Time: 02/13/24 10:49 AM   Urine  Result Value Ref Range   WBC, UA 0-5 0 - 5 /hpf   RBC, Urine 0-2 0 - 2 /hpf   Epithelial Cells (non renal) 0-10 0 - 10 /hpf   Casts Present (A) None seen /lpf   Cast Type Hyaline casts N/A   Bacteria, UA None seen None seen/Few  Urinalysis, Complete   Collection Time: 02/13/24 10:49 AM  Result Value Ref Range   Specific Gravity, UA 1.020 1.005 - 1.030   pH, UA 6.0 5.0 - 7.5   Color, UA Yellow Yellow   Appearance Ur Clear Clear   Leukocytes,UA Negative Negative   Protein,UA Negative Negative/Trace   Glucose, UA Negative Negative   Ketones, UA Negative Negative   RBC, UA Trace (A) Negative   Bilirubin, UA Negative Negative   Urobilinogen, Ur 0.2 0.2 - 1.0 mg/dL   Nitrite, UA Negative Negative   Microscopic Examination See below:   Bladder Scan (Post Void Residual) in office   Collection Time: 02/13/24 11:03 AM  Result Value Ref Range   Scan Result 4mL     Assessment and Plan  Chest pressure Assessment & Plan: Acute, atypical chest pain most consistent with GI source.  Given patient is high risk with history of coronary artery  disease status post CABG followed by PCI in 2019, EKG was performed.  EKG showed no significant change from previous. Patient is on aspirin , Plavix , Coreg  and Lipitor.  Encouraged patient to increase PPI to 40 mg daily and to start famotidine  10 mg twice daily.  Avoid acid triggers.  If symptoms continuing follow-up with cardiology.  If severe chest pain and shortness of breath go to the emergency room.  Orders: -     EKG 12-Lead  Coronary artery disease involving native coronary artery of native heart without angina pectoris  Other orders -     Famotidine ; Take 1 tablet (40 mg total) by mouth 2 (two) times daily.  Dispense: 60 tablet; Refill: 0    No follow-ups on file.   Greig Ring, MD

## 2024-03-12 NOTE — Patient Instructions (Addendum)
 Increase famotidine  (Pepcid  AC) to 40 mg twice daily. Continue pantoprazole  40 mg daily.

## 2024-03-18 ENCOUNTER — Ambulatory Visit: Payer: Self-pay | Admitting: Pulmonary Disease

## 2024-03-18 NOTE — Telephone Encounter (Signed)
 Per secure chat with Dr. Corliss, he can see Dr. Isadora tomorrow for an acute visit.   I spoke with the patient and offered him an appt tomorrow. He declined the appt. I offered him an appt next wed and he again declined. He said the triage nurse gave him OTC options to try for his cough and he would try those first. He will call back if he needs anything else.  Nothing further needed.

## 2024-03-18 NOTE — Telephone Encounter (Signed)
 FYI Only or Action Required?: Action required by provider: update on patient condition.  Patient is followed in Pulmonology for SOB, last seen on 01/08/2024 by Malka Domino, MD.  Called Nurse Triage reporting Shortness of Breath.  Symptoms began several months ago.  Interventions attempted: Rescue inhaler.  Symptoms are: gradually worsening.  Triage Disposition: See PCP Within 2 Weeks  Patient/caregiver understands and will follow disposition?: Yes           Copied from CRM [id number]. Topic: Clinical - Red Word Triage >> Mar 18, 2024  4:10 PM Leila C wrote: Red Word that prompted transfer to Nurse Triage: Patient [phone] states 04/12/24 at 1:15 pm with Dr. Malka, wants to be seen sooner due to the coughing is getting worse, Trelegy and albuterol  inhaler is not helping, Patient states running out of breath quicker than use to, wheezing, pressure in chest today, dizziness- had a stroke 2015 and balance is off. Patient denies a fever. Please advise. Reason for Disposition  Cough lasts > 3 weeks    Pt looking to get sooner appt with primary [facility] (scheduled appt 3 weeks). No access to any provider at [facility]. Declined alternate location d/t driving limitations/distance.  Triager will forward encounter for Dr Malka 's office to review and advise. Patient verbalized understanding and is expecting call back from office for next steps/if appt accommodations can be made.  Answer Assessment - Initial Assessment Questions E2C2 Pulmonary Triage - Initial Assessment Questions Chief Complaint (e.g., cough, sob, wheezing, fever, chills, sweat or additional symptoms) *Go to specific symptom protocol after initial questions. Dry coughing - worse at night  How long have symptoms been present? September since last appt  Have you tested for COVID or Flu? Note: If not, ask patient if a home test can be taken. If so, instruct patient to call back for positive  results. No  MEDICINES:   Have you used any OTC meds to help with symptoms? No If yes, ask What medications? N/a  Have you used your inhalers/maintenance medication? No If yes, What medications? Albuterol  INH PRN - does not provide relief, last used this AM  If inhaler, ask How many puffs and how often? Note: Review instructions on medication in the chart. N/a  OXYGEN: Do you wear supplemental oxygen? No If yes, How many liters are you supposed to use? N/a  Do you monitor your oxygen levels? No If yes, What is your reading (oxygen level) today? Endorses needs to use it more often. Triager asked to check while on phone. 97 BPM 61  What is your usual oxygen saturation reading?  (Note: Pulmonary O2 sats should be 90% or greater) unknown            1. RESPIRATORY STATUS: Describe your breathing? (e.g., wheezing, shortness of breath, unable to speak, severe coughing)      coughing 2. ONSET: When did this breathing problem begin?      X months 3. PATTERN Does the difficult breathing come and go, or has it been constant since it started?      Comes and goes, mainly at night 4. SEVERITY: How bad is your breathing? (e.g., mild, moderate, severe)      Mild Triager does not appreciate audible SOB/wheezing during call. Pt is speaking in full sentences.  5. RECURRENT SYMPTOM: Have you had difficulty breathing before? If Yes, ask: When was the last time? and What happened that time?      denies 6. CARDIAC HISTORY: Do you have any history  of heart disease? (e.g., heart attack, angina, bypass surgery, angioplasty)      Hx of bypass surgery 2000, stent hx 7. LUNG HISTORY: Do you have any history of lung disease?  (e.g., pulmonary embolus, asthma, emphysema)     denies 8. CAUSE: What do you think is causing the breathing problem?      unknown 9. OTHER SYMPTOMS: Do you have any other symptoms? (e.g., chest pain, cough, dizziness, fever,  runny nose)     Runny nose, denies others 10. O2 SATURATION MONITOR:  Do you use an oxygen saturation monitor (pulse oximeter) at home? If Yes, ask: What is your reading (oxygen level) today? What is your usual oxygen saturation reading? (e.g., 95%)       See above 11. PREGNANCY: Is there any chance you are pregnant? When was your last menstrual period?       N/a 12. TRAVEL: Have you traveled out of the country in the last month? (e.g., travel history, exposures)       N/a  Protocols used: Breathing Difficulty-A-AH, Cough - Chronic-A-AH

## 2024-04-02 ENCOUNTER — Telehealth: Payer: Self-pay | Admitting: Emergency Medicine

## 2024-04-02 NOTE — Telephone Encounter (Signed)
 Called and spoke with the patient.  Patient confirmed that he does have a cough and that his PCP, Cable, put him on Losartan  and discontinued Lisinopril , due to cough.  Patient unable to confirm if the cough is any worse with lisinopril  than it is with Losartan .  Patient also concerned that environmental factors may play a role in the cough.  Asked the patient to follow up with PCP and Pulmonary to find out if the cough is solely attributable to medication.  And if that turns out to be the case, and if the patient feels that Losartan  is better for the cough than Lisinopril , we would make the change.  Patient agreeable with this plan, and agrees to make an appointment with PCP and has an appointment scheduled to see pulmonary within the next couple of weeks.  Patient agrees to call back and let us  know what the consensus is and we will make the appropriate changes.  Patient verbalized understanding with all questions and concerns addressed at this time.  Patient expressed gratitude for the call.

## 2024-04-05 ENCOUNTER — Ambulatory Visit: Admitting: Podiatry

## 2024-04-05 DIAGNOSIS — Z91198 Patient's noncompliance with other medical treatment and regimen for other reason: Secondary | ICD-10-CM

## 2024-04-05 NOTE — Progress Notes (Signed)
 1. Failure to attend appointment with reason given    Appointment rescheduled by patient due to inclement weather.

## 2024-04-08 NOTE — Progress Notes (Unsigned)
 04/14/2024 2:32 PM   Cory [name] 08-18-1940 [id number]  Referring provider: Wendee Cory HERO, NP [address] [place],  [place] [address]  Urological history 1. BPH with LU TS -aged out of PSA screening -cysto 06/2020 - mild prostamegaly with bladder trabeculation -tamsulosin  0.4 mg daily  2. Urinary frequency -contributing factors of age, BPH, stroke, HTN, diabetes, anxiety and depression -failed Gemtesa  75 mg daily and it was cost prohibitive   3. Sepsis E. Coli bacteremia secondary to UTI 09/2021 -non-contrast CT, 09/2021 - NED  Chief Complaint  Patient presents with   BPH with obstruction/lower urinary tract symptoms    HPI: Cory [name] is a 83 y.o. man who presents today for  two month follow up.  Previous records reviewed.   At his previous visit he was having nasal congestion at night so he switched from tamsulosin  to alfuzosin  to see if switching alpha-blocker as would cause less nasal congestion.  The alfuzosin  made no difference in his urinary symptoms worsen, so he went back on the tamsulosin .  He was also given oxybutynin  XL 10 mg daily to see if it would address his urgency and frequency.  He did not start the oxybutynin  as he was not sure what the medication was for.  He also has been taking the tamsulosin  a few days on a few days off.  He still has bothersome urinary frequency.  I PSS   He reports urinary frequency, urinary intermittency, urinary urgency, a weak urinary stream, having to strain to void, nocturia x 2.    Patient denies any modifying or aggravating factors.  Patient denies any recent UTI's, gross hematuria, dysuria or suprapubic/flank pain.  Patient denies any fevers, chills, nausea or vomiting.    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 Nodular and infiltrative. Mohs pending.   Basal cell carcinoma 12/22/2023   Left Inframammary Fold, pt scheduled for excision   Basal cell carcinoma 12/22/2023   Left Superior Shoulder, scheduled for surgery   Basal cell carcinoma 12/22/2023   Right Shoulder - Posterior, EDC 01/06/24   Basal cell carcinoma 12/22/2023   Left Lower Back, pt schedule for excision   BCC (basal cell carcinoma of skin) 07/16/2023   BCC on right central forehead treated with Mohs 07/16/23    CAD (coronary artery disease)    CABG   Cataract    Depression    Diabetes mellitus without complication (HCC)    GERD (gastroesophageal reflux disease)    Glaucoma    Heart murmur    Hyperlipidemia    Hypertension    Myocardial infarction (HCC)    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 Clinically clear with biopsy, monitor for recurrence.   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   Left scalp - EDC   Stroke (HCC)    on Plavix  and ASA   Vasovagal syncope  Surgical History: Past Surgical History:  Procedure Laterality Date   CHOLECYSTECTOMY     CORONARY ARTERY BYPASS GRAFT  2000   COSMETIC SURGERY  2022   DEBRIDEMENT AND CLOSURE WOUND N/A 01/19/2021   Procedure: Closure of scalp defect;  Surgeon: Elisabeth Craig RAMAN, MD;  [facility]  [facility];  Service: Plastics;  Laterality: N/A;   EYE SURGERY     JOINT REPLACEMENT  2017   MOHS SURGERY     NECK SURGERY     VASECTOMY      Home Medications:  Allergies as of 04/14/2024       Reactions   Fesoterodine Other (See Comments)   Dry mouth and constipation   Isosorbide Other (See Comments)    Other Other (See Comments)   Grass Pollen(k-o-r-t-swt Vern) Other (See Comments)   Hydrocodone Anxiety   Meperidine Other (See Comments), Rash   Other reaction(s): Other (See Comments) HALLUCINATIONS   Penicillins Rash        Medication List        Accurate as of April 14, 2024  2:32 PM. If you have any questions, ask your nurse or doctor.          acetaminophen  500 MG tablet Commonly known as: TYLENOL  Take 1,000 mg by mouth at bedtime.   albuterol  108 (90 Base) MCG/ACT inhaler Commonly known as: VENTOLIN  HFA Inhale 2 puffs into the lungs every 6 (six) hours as needed for wheezing or shortness of breath.   alfuzosin  10 MG 24 hr tablet Commonly known as: UROXATRAL  Take 10 mg by mouth daily.   amLODipine  5 MG tablet Commonly known as: NORVASC  TAKE 1 TABLET EVERY DAY   aspirin  EC 81 MG tablet Take 1 tablet (81 mg total) by mouth daily. Swallow whole.   atorvastatin  40 MG tablet Commonly known as: LIPITOR Take 1 tablet (40 mg total) by mouth daily.   carvedilol  6.25 MG tablet Commonly known as: COREG  TAKE 1 TABLET TWICE DAILY WITH MEALS   clopidogrel  75 MG tablet Commonly known as: PLAVIX  Take 1 tablet (75 mg total) by mouth daily.   diclofenac Sodium 1 % Gel Commonly known as: VOLTAREN Apply 2 g topically as needed.   dorzolamide-timolol 2-0.5 % ophthalmic solution Commonly known as: COSOPT Place 1 drop into both eyes 2 (two) times daily.   escitalopram  5 MG tablet Commonly known as: LEXAPRO  Take 5 mg by mouth daily.   famotidine  40 MG tablet Commonly known as: PEPCID  Take 1 tablet (40 mg total) by mouth 2 (two) times daily.   fexofenadine  180 MG tablet Commonly known as: Allegra  Allergy Take 1 tablet (180 mg total) by mouth daily.   fluticasone  50 MCG/ACT nasal spray Commonly known as: FLONASE  Place 2 sprays into both nostrils daily.   latanoprost  0.005 % ophthalmic solution Commonly known as: XALATAN  daily.   lisinopril  20 MG  tablet Commonly known as: ZESTRIL  Take 20 mg by mouth daily.   montelukast  10 MG tablet Commonly known as: SINGULAIR  TAKE 1 TABLET (10 MG TOTAL) BY MOUTH AT BEDTIME.   nitroGLYCERIN  0.4 MG SL tablet Commonly known as: NITROSTAT  PLACE 1 TABLET UNDER THE TONGUE EVERY 5 MINUTES AS NEEDED.   oxybutynin  10 MG 24 hr tablet Commonly known as: DITROPAN -XL Take 10 mg by mouth daily.   pantoprazole  40 MG tablet Commonly known as: PROTONIX  TAKE 1 TABLET EVERY DAY   Trelegy Ellipta  100-62.5-25 MCG/ACT Aepb Generic drug: Fluticasone -Umeclidin-Vilant Inhale 1 puff into the lungs daily.  Allergies:  Allergies  Allergen Reactions   Fesoterodine Other (See Comments)    Dry mouth and constipation   Isosorbide Other (See Comments)   Other Other (See Comments)   Grass Pollen(K-O-R-T-Swt Vern) Other (See Comments)   Hydrocodone Anxiety   Meperidine Other (See Comments) and Rash    Other reaction(s): Other (See Comments) HALLUCINATIONS    Penicillins Rash    Family History: Family History  Problem Relation Age of Onset   Heart Problems Mother    Heart disease Mother    Arthritis Mother    Hypertension Mother    Stroke Mother    Heart Problems Father    Heart attack Father    Heart disease Father    Hypertension Father    Heart disease Brother    Hypertension Brother     Social History:  reports that he quit smoking about 34 years ago. His smoking use included pipe, cigars, and cigarettes. He has never used smokeless tobacco. He reports that he does not currently use alcohol. He reports that he does not use drugs.  ROS: Pertinent ROS in HPI  Physical Exam: BP (!) 154/56   Pulse 64   Ht 5' 5 (1.651 m)   Wt 163 lb (73.9 kg)   SpO2 95%   BMI 27.12 kg/m   Constitutional:  Well nourished. Alert and oriented, No acute distress. HEENT: [place] AT, moist mucus membranes.  Trachea midline Cardiovascular: No clubbing, cyanosis, or edema. Respiratory: Normal respiratory  effort, no increased work of breathing. Neurologic: Grossly intact, no focal deficits, moving all 4 extremities. Psychiatric: Normal mood and affect.   Laboratory Data: See Epic and HPI   I have reviewed the labs.   Pertinent Imaging:  N/A  Assessment & Plan:    1. BPH with LUTS - He will discontinue the tamsulosin  0.4 mg daily  2. Urgency/frequency  - He will start the oxybutynin  XL 10 mg daily  Return in about 6 weeks (around 05/26/2024) for I PSS and PVR .  These notes generated with voice recognition software. I apologize for typographical errors.  CLOTILDA HELON RIGGERS  [facility] [address]  [address] [place], [place] [address] [phone]

## 2024-04-09 ENCOUNTER — Ambulatory Visit
Admission: RE | Admit: 2024-04-09 | Discharge: 2024-04-09 | Disposition: A | Source: Ambulatory Visit | Attending: Nurse Practitioner

## 2024-04-09 ENCOUNTER — Ambulatory Visit: Admitting: Nurse Practitioner

## 2024-04-09 VITALS — BP 114/68 | HR 61 | Temp 97.7°F | Ht 65.0 in | Wt 165.2 lb

## 2024-04-09 DIAGNOSIS — K219 Gastro-esophageal reflux disease without esophagitis: Secondary | ICD-10-CM

## 2024-04-09 DIAGNOSIS — R053 Chronic cough: Secondary | ICD-10-CM | POA: Diagnosis not present

## 2024-04-09 NOTE — Progress Notes (Signed)
 Established Patient Office Visit  Subjective   Patient ID: Cory [name], male    DOB: 1940-08-01  Age: 83 y.o. MRN: [id number]  Chief Complaint  Patient presents with   Cough    Pt complains of lingering cough that started during summer time. Pt states the cough has gotten worse. Pt believes this is due to the medications he is on.    Medication Management     Discussed the use of AI scribe software for clinical note transcription with the patient, who gave verbal consent to proceed.  History of Present Illness Cory [name] is an 83 year old male who presents with a persistent cough and wheezing.  He has experienced a persistent cough since the summer, which has continued into the fall. The cough is waxing and waning, with periods of worsening, particularly at night. He occasionally produces sputum, which is sometimes colored and has a bad taste. No fever, chills, sore throat, sinus pain, or headaches are present. He frequently experiences wheezing.  He has attempted to use an albuterol  inhaler for relief, but it has been ineffective, even during episodes of wheezing.  His medical history includes hypertension, for which he is currently taking lisinopril  after discontinuing losartan . He is unsure of the exact dosage of lisinopril . He also takes Protonix  in the morning and Pepcid  at night for heartburn, which occurs frequently, especially when bending over. He sometimes forgets to use Flonase  nasal spray but generally uses it regularly. He is not currently using Allegra .      Review of Systems  Constitutional:  Negative for chills and fever.  HENT:  Negative for sinus pain and sore throat.   Respiratory:  Positive for cough and wheezing. Negative for shortness of breath.   Neurological:  Negative for dizziness and headaches.      Objective:     BP 114/68   Pulse 61   Temp 97.7 F (36.5 C) (Oral)   Ht 5' 5 (1.651 m)   Wt 165 lb 3.2 oz (74.9 kg)    SpO2 95%   BMI 27.49 kg/m  BP Readings from Last 3 Encounters:  04/09/24 114/68  03/12/24 128/62  03/11/24 116/68   Wt Readings from Last 3 Encounters:  04/09/24 165 lb 3.2 oz (74.9 kg)  03/12/24 162 lb 6 oz (73.7 kg)  03/11/24 165 lb (74.8 kg)   SpO2 Readings from Last 3 Encounters:  04/09/24 95%  03/12/24 96%  02/13/24 96%      Physical Exam Vitals and nursing note reviewed.  Constitutional:      Appearance: Normal appearance.  HENT:     Right Ear: Tympanic membrane, ear canal and external ear normal.     Left Ear: Tympanic membrane, ear canal and external ear normal.     Mouth/Throat:     Mouth: Mucous membranes are moist.     Pharynx: Oropharynx is clear.     Comments: PND Cardiovascular:     Rate and Rhythm: Normal rate and regular rhythm.     Heart sounds: Normal heart sounds.  Pulmonary:     Effort: Pulmonary effort is normal.     Comments: Decreased on right upper and lower lobe Lymphadenopathy:     Cervical: No cervical adenopathy.  Neurological:     Mental Status: He is alert.      No results found for any visits on 04/09/24.    The ASCVD Risk score (Arnett DK, et al., 2019) failed to calculate for the  following reasons:   The 2019 ASCVD risk score is only valid for ages 46 to 69   Risk score cannot be calculated because patient has a medical history suggesting prior/existing ASCVD   * - Cholesterol units were assumed    Assessment & Plan:   Problem List Items Addressed This Visit       Digestive   Gastroesophageal reflux disease     Other   Chronic cough - Primary   Relevant Orders   DG Chest 2 View  Assessment and Plan Assessment & Plan Chronic cough with wheezing Chronic cough with nocturnal worsening and wheezing. Albuterol  ineffective. Differential includes medication-induced cough, allergy, asthma, post-nasal drip, and GERD.  Has been evaluated in the past.  Has an acute visit scheduled for 04/12/2024 - Ordered chest x-ray to rule  out infectious causes. - Continue follow-up with pulmonologist on Monday. - Increase Protonix  to 40 mg twice daily if chest x-ray is normal.  Will discontinue famotidine  at that point  Gastroesophageal reflux disease Persistent heartburn despite current medication. Potential contribution to chronic cough. - Increased Protonix  to 40 mg twice daily and discontinue famotidine .  Primary hypertension Blood pressure managed with amlodipine , carvedilol , and reintroduced lisinopril . Unclear if lisinopril  contributes to cough. - Verify lisinopril  dosage via MyChart.     Return if symptoms worsen or fail to improve, for As scheduled .    Adina Crandall, NP

## 2024-04-09 NOTE — Patient Instructions (Signed)
 Nice to see you today I will be in touch with the xray once I have it Message me what dose of lisinopril  you are taking  Keep the appointment with the lung doctor Monday If the xray is fine I will increase the Protonix 

## 2024-04-10 ENCOUNTER — Encounter: Payer: Self-pay | Admitting: Nurse Practitioner

## 2024-04-10 DIAGNOSIS — R0789 Other chest pain: Secondary | ICD-10-CM | POA: Insufficient documentation

## 2024-04-10 MED ORDER — FAMOTIDINE 40 MG PO TABS: 40.0000 mg | ORAL_TABLET | Freq: Two times a day (BID) | ORAL | 0 refills | Status: AC

## 2024-04-10 NOTE — Assessment & Plan Note (Signed)
 Acute, atypical chest pain most consistent with GI source.  Given patient is high risk with history of coronary artery disease status post CABG followed by PCI in 2019, EKG was performed.  EKG showed no significant change from previous. Patient is on aspirin , Plavix , Coreg  and Lipitor.  Encouraged patient to increase PPI to 40 mg daily and to start famotidine  10 mg twice daily.  Avoid acid triggers.  If symptoms continuing follow-up with cardiology.  If severe chest pain and shortness of breath go to the emergency room.

## 2024-04-12 ENCOUNTER — Ambulatory Visit: Payer: Self-pay | Admitting: Nurse Practitioner

## 2024-04-12 ENCOUNTER — Encounter: Payer: Self-pay | Admitting: Pulmonary Disease

## 2024-04-12 ENCOUNTER — Ambulatory Visit: Admitting: Pulmonary Disease

## 2024-04-12 VITALS — BP 126/60 | HR 47 | Temp 98.4°F | Ht 65.0 in | Wt 163.6 lb

## 2024-04-12 DIAGNOSIS — R0602 Shortness of breath: Secondary | ICD-10-CM

## 2024-04-12 DIAGNOSIS — I2510 Atherosclerotic heart disease of native coronary artery without angina pectoris: Secondary | ICD-10-CM

## 2024-04-12 DIAGNOSIS — Z951 Presence of aortocoronary bypass graft: Secondary | ICD-10-CM

## 2024-04-12 DIAGNOSIS — I10 Essential (primary) hypertension: Secondary | ICD-10-CM

## 2024-04-12 DIAGNOSIS — E785 Hyperlipidemia, unspecified: Secondary | ICD-10-CM

## 2024-04-12 DIAGNOSIS — R062 Wheezing: Secondary | ICD-10-CM

## 2024-04-12 MED ORDER — TRELEGY ELLIPTA 100-62.5-25 MCG/ACT IN AEPB: 1.0000 | INHALATION_SPRAY | Freq: Every day | RESPIRATORY_TRACT | 3 refills | Status: AC

## 2024-04-12 NOTE — Progress Notes (Signed)
 Synopsis: Referred in by Wendee Cory HERO, NP   Subjective:   PATIENT ID: Cory [name] GENDER: male DOB: 1940/08/27, MRN: [id number]  Chief Complaint  Patient presents with   Shortness of Breath    SOB. Wheezing. Cough with clear sputum.  Albuterol - PRN    HPI Mr. 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    He reports that he has been having shortness of breath associated with exhaustion and fatigue for few weeks now. He specifically noticed this when his carvedilol  was increased to 12.5 from 6.25 mg BID. He does report some chest tightness and wheezing mostly at night.   He denies any history of pulmonary diseases. He was never a heavy smoker or regular smoker. He smoked pipe in the past and then  cigars occasionally.   CXR 07/2023 - No acute findings.   Echocardiogram 09/2021 - LVEF 55 to 60% with no RWMA, grade I diastolic dysfunction. RV function is normal, estimated RVSP is 41.4 mmHg. No valvular disease.    Family history: He denies any family history of lung diseases.   Social history:  Smoked pipe in the past - occasional cigars smoking. He is a retired art gallery manager. He has 1 dog at home.   OV 01/08/2024 - Cory [name] is here to follow up on his PFT results that show reduced DLCO suggesting PVD. He is still complaining of Shortness of breath on exertion and fatigue. His Echo in 2023 shows an RVSP of 41 mmHg. I will repeat an echocardiogram and sleep study. If the echo shows increased PASP or RVSP we will discuss pursuing a RHC. I will also trial an ICS-LABA and assess if that can bring him some relief.   OV 04/12/2024 -Cory [name] is here to follow-up on his echocardiogram results that were reassuring and showed normal RVSP with no RV dysfunction.  Therefore  PVD is less likely.  Appears to have some degree of reactive airway disease/adult onset asthma.  I will start him on Trelegy 1 puff daily.  I advised him to continue on albuterol  as needed.  Will assess response in 6 months.  ROS All systems were reviewed and are negative except for the above.  Objective:   Vitals:   04/12/24 1331  BP: 126/60  Pulse: (!) 47  Temp: 98.4 F (36.9 C)  SpO2: 96%  Weight: 163 lb 9.6 oz (74.2 kg)  Height: 5' 5 (1.651 m)   96% on RA BMI Readings from Last 3 Encounters:  04/12/24 27.22 kg/m  04/09/24 27.49 kg/m  03/12/24 27.02 kg/m   Wt Readings from Last 3 Encounters:  04/12/24 163 lb 9.6 oz (74.2 kg)  04/09/24 165 lb 3.2 oz (74.9 kg)  03/12/24 162 lb 6 oz (73.7 kg)    Physical Exam GEN: NAD, Healthy Appearing HEENT: Supple Neck, Reactive Pupils, EOMI  CVS: Normal S1, Normal S2, RRR, No murmurs or ES appreciated  Lungs: Clear bilateral air entry.  Abdomen: Soft, non tender, non distended, + BS  Extremities: Warm and well perfused, No edema  Skin: No suspicious lesions appreciated  Psych: Normal Affect  Ancillary Information   CBC    Component Value Date/Time   WBC 5.6 01/20/2024 1407   RBC 3.75 (L) 01/20/2024 1407   HGB 11.2 (L) 01/20/2024 1407  HGB 12.1 (L) 03/13/2020 1123   HCT 34.5 (L) 01/20/2024 1407   HCT 37.0 (L) 03/13/2020 1123   PLT 169 01/20/2024 1407   PLT 216 03/13/2020 1123   MCV 92.0 01/20/2024 1407   MCV 93 03/13/2020 1123   MCH 29.9 01/20/2024 1407   MCHC 32.5 01/20/2024 1407   RDW 13.0 01/20/2024 1407   RDW 12.3 03/13/2020 1123   LYMPHSABS 1.4 01/20/2024 1407   LYMPHSABS 1.0 03/13/2020 1123   MONOABS 0.7 01/20/2024 1407   EOSABS 0.3 01/20/2024 1407   EOSABS 0.1 03/13/2020 1123   BASOSABS 0.1 01/20/2024 1407   BASOSABS 0.0 03/13/2020 1123   Labs and imaging were reviewed.     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    #Shortness of breath and exhaustion  He is reporting shortness of breath, wheezing at night and persistent cough for more than a year.  Mostly pronounced at night and when he lays flat.  Chest x-ray without any abnormalities.  CT scan done in 2024 does not show any parenchymal lung disease but does show a moderately large hiatal hernia.  I think he might have some underlying reactive airway disease that is exacerbated by recurrent microaspiration.  He is on adequate acid reflux suppressive regimen.  I will start him on Trelegy 1 puff daily and assess response in 6 months.  RTC 3 months.   I personally spent a total of 40 minutes in the care of the patient today including preparing to see the patient, getting/reviewing separately obtained history, performing a medically appropriate exam/evaluation, counseling and educating, documenting clinical information in the EHR, independently interpreting results, and communicating results.   Darrin Barn, MD [facility] 04/12/2024 4:39 PM

## 2024-04-14 ENCOUNTER — Encounter: Payer: Self-pay | Admitting: Oncology

## 2024-04-14 ENCOUNTER — Ambulatory Visit: Admitting: Urology

## 2024-04-14 ENCOUNTER — Encounter: Payer: Self-pay | Admitting: Urology

## 2024-04-14 VITALS — BP 154/56 | HR 64 | Ht 65.0 in | Wt 163.0 lb

## 2024-04-14 DIAGNOSIS — R350 Frequency of micturition: Secondary | ICD-10-CM

## 2024-04-14 DIAGNOSIS — R3915 Urgency of urination: Secondary | ICD-10-CM

## 2024-04-14 DIAGNOSIS — N138 Other obstructive and reflux uropathy: Secondary | ICD-10-CM

## 2024-04-14 DIAGNOSIS — N401 Enlarged prostate with lower urinary tract symptoms: Secondary | ICD-10-CM | POA: Diagnosis not present

## 2024-04-14 NOTE — Patient Instructions (Signed)
 Take oxybutynin  XL 10 mg (Ditropan ) daily - this hopefully will stop you from urinating so often  Tamsulosin  0.4 mg (Flomax  0.4 mg) daily - this is to relax the prostate capsule and bladder neck to help empty the bladder better, you are going to stop this medication as you as emptying your bladder just fine

## 2024-04-16 MED ORDER — LISINOPRIL 20 MG PO TABS: 20.0000 mg | ORAL_TABLET | Freq: Every day | ORAL | 1 refills | Status: AC

## 2024-04-16 NOTE — Addendum Note (Signed)
 Addended by: WENDEE [name] HERO on: 04/16/2024 04:40 PM   Modules accepted: Orders

## 2024-04-27 ENCOUNTER — Ambulatory Visit

## 2024-04-27 DIAGNOSIS — I779 Disorder of arteries and arterioles, unspecified: Secondary | ICD-10-CM | POA: Insufficient documentation

## 2024-04-27 DIAGNOSIS — I6521 Occlusion and stenosis of right carotid artery: Secondary | ICD-10-CM

## 2024-05-03 ENCOUNTER — Ambulatory Visit: Payer: Self-pay | Admitting: Cardiovascular Disease

## 2024-05-03 DIAGNOSIS — I739 Peripheral vascular disease, unspecified: Secondary | ICD-10-CM

## 2024-05-12 ENCOUNTER — Other Ambulatory Visit: Payer: Self-pay | Admitting: Urology

## 2024-05-12 DIAGNOSIS — R3915 Urgency of urination: Secondary | ICD-10-CM

## 2024-05-12 DIAGNOSIS — N401 Enlarged prostate with lower urinary tract symptoms: Secondary | ICD-10-CM

## 2024-05-17 ENCOUNTER — Ambulatory Visit: Admitting: Nurse Practitioner

## 2024-05-24 ENCOUNTER — Ambulatory Visit: Admitting: Dermatology

## 2024-05-25 ENCOUNTER — Inpatient Hospital Stay

## 2024-05-26 ENCOUNTER — Ambulatory Visit: Admitting: Oncology

## 2024-05-26 ENCOUNTER — Ambulatory Visit

## 2024-05-27 ENCOUNTER — Ambulatory Visit: Admitting: Urology

## 2024-05-27 ENCOUNTER — Encounter: Admitting: Nurse Practitioner

## 2024-06-02 ENCOUNTER — Ambulatory Visit: Admitting: Dermatology

## 2024-06-02 ENCOUNTER — Encounter: Payer: Self-pay | Admitting: Dermatology

## 2024-06-02 DIAGNOSIS — C44519 Basal cell carcinoma of skin of other part of trunk: Secondary | ICD-10-CM | POA: Diagnosis not present

## 2024-06-02 MED ORDER — MUPIROCIN 2 % EX OINT: 1.0000 | TOPICAL_OINTMENT | Freq: Every day | CUTANEOUS | 0 refills | Status: AC

## 2024-06-02 NOTE — Progress Notes (Signed)
" ° °  Follow-Up Visit   Subjective  Cory [name] is a 84 y.o. male who presents for the following: Excision of BCC at left inframammary  The following portions of the chart were reviewed this encounter and updated as appropriate: medications, allergies, medical history  Review of Systems:  No other skin or systemic complaints except as noted in HPI or Assessment and Plan.  Objective  Well appearing patient in no apparent distress; mood and affect are within normal limits.  A focused examination was performed of the following areas: abdomen Relevant physical exam findings are noted in the Assessment and Plan.   Left Inframammary Fold Pink bx site  Assessment & Plan   Offered EDC vs excision vs observation for other two BCC L lower back and L sup shoulder. 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 Left Inframammary Fold - Skin excision  Excision method:  elliptical Lesion length (cm):  1.2 Margin per side (cm):  0.4 Total excision diameter (cm):  2 Informed consent: discussed and consent obtained   Timeout: patient name, date of birth, surgical site, and procedure verified   Procedure prep:  Patient was prepped and draped in usual sterile fashion Prep type:  Chlorhexidine Anesthesia: the lesion was anesthetized in a standard fashion   Anesthetic:  1% lidocaine  w/ epinephrine  1-100,000 buffered w/ 8.4% NaHCO3 (15 cc) Instrument used: #15 blade   Hemostasis achieved with: suture, pressure and electrodesiccation   Outcome: patient tolerated procedure well with no complications    - Skin repair Complexity:  Intermediate Final length (cm):  5 Informed consent: discussed and consent obtained   Timeout: patient name, date of birth, surgical site, and procedure verified   Procedure prep:  Patient was prepped and draped in usual sterile fashion Prep type:  Chlorhexidine Anesthesia: the lesion was anesthetized in a standard fashion    Anesthetic:  1% lidocaine  w/ epinephrine  1-100,000 buffered w/ 8.4% NaHCO3 Reason for type of repair: reduce tension to allow closure, reduce the risk of dehiscence, infection, and necrosis, reduce subcutaneous dead space and avoid a hematoma, allow closure of the large defect and preserve normal anatomy   Undermining: edges could be approximated without difficulty   Subcutaneous layers (deep stitches):  Suture size:  4-0 Suture type: Vicryl (polyglactin 910)   Stitches:  Buried vertical mattress Fine/surface layer approximation (top stitches):  Suture size:  5-0 Suture type: Prolene (polypropylene)   Stitches comment:  Running locked Suture removal (days):  12 Hemostasis achieved with: suture, pressure and electrodesiccation Outcome: patient tolerated procedure well with no complications   Post-procedure details: sterile dressing applied and wound care instructions given   Dressing type: petrolatum, bandage and pressure dressing    Specimen 1 - Surgical pathology Differential Diagnosis: BX proven BCC  Check Margins: yes [id number]   Return in about 12 days (around 06/14/2024) for Suture Removal, with Dr. Claudene, [facility].  Cory [name], RMA, am acting as scribe for Boneta Claudene, MD .   Documentation: I have reviewed the above documentation for accuracy and completeness, and I agree with the above.  Boneta Claudene, MD  "

## 2024-06-02 NOTE — Patient Instructions (Signed)

## 2024-06-03 ENCOUNTER — Ambulatory Visit: Admitting: Dermatology

## 2024-06-03 LAB — SURGICAL PATHOLOGY

## 2024-06-04 ENCOUNTER — Encounter: Payer: Self-pay | Admitting: Dermatology

## 2024-06-09 ENCOUNTER — Encounter: Admitting: Dermatology

## 2024-06-10 ENCOUNTER — Encounter: Admitting: Nurse Practitioner

## 2024-06-14 ENCOUNTER — Ambulatory Visit: Admitting: Dermatology

## 2024-06-16 ENCOUNTER — Encounter: Admitting: Dermatology

## 2024-06-21 ENCOUNTER — Ambulatory Visit: Admitting: Dermatology

## 2024-06-22 ENCOUNTER — Inpatient Hospital Stay

## 2024-06-24 ENCOUNTER — Inpatient Hospital Stay: Admitting: Oncology

## 2024-06-24 ENCOUNTER — Inpatient Hospital Stay

## 2024-06-28 ENCOUNTER — Ambulatory Visit: Admitting: Urology

## 2024-07-01 ENCOUNTER — Ambulatory Visit: Admitting: Podiatry
